# Patient Record
Sex: Female | Born: 1935
Health system: Southern US, Community
[De-identification: ages and names within clinical notes are randomized; demographics above are authoritative.]

## PROBLEM LIST (undated history)

## (undated) DIAGNOSIS — K219 Gastro-esophageal reflux disease without esophagitis: Secondary | ICD-10-CM

## (undated) DIAGNOSIS — I219 Acute myocardial infarction, unspecified: Secondary | ICD-10-CM

## (undated) DIAGNOSIS — I428 Other cardiomyopathies: Secondary | ICD-10-CM

## (undated) DIAGNOSIS — R609 Edema, unspecified: Secondary | ICD-10-CM

## (undated) DIAGNOSIS — I495 Sick sinus syndrome: Secondary | ICD-10-CM

## (undated) DIAGNOSIS — R0989 Other specified symptoms and signs involving the circulatory and respiratory systems: Secondary | ICD-10-CM

## (undated) DIAGNOSIS — D649 Anemia, unspecified: Secondary | ICD-10-CM

## (undated) DIAGNOSIS — E119 Type 2 diabetes mellitus without complications: Secondary | ICD-10-CM

## (undated) DIAGNOSIS — M8580 Other specified disorders of bone density and structure, unspecified site: Secondary | ICD-10-CM

## (undated) DIAGNOSIS — I10 Essential (primary) hypertension: Secondary | ICD-10-CM

## (undated) DIAGNOSIS — I4819 Other persistent atrial fibrillation: Secondary | ICD-10-CM

## (undated) DIAGNOSIS — E785 Hyperlipidemia, unspecified: Secondary | ICD-10-CM

## (undated) DIAGNOSIS — I2510 Atherosclerotic heart disease of native coronary artery without angina pectoris: Secondary | ICD-10-CM

## (undated) HISTORY — DX: Other cardiomyopathies: I42.8

## (undated) HISTORY — DX: Type 2 diabetes mellitus without complications: E11.9

## (undated) HISTORY — DX: Atherosclerotic heart disease of native coronary artery without angina pectoris: I25.10

## (undated) HISTORY — DX: Acute myocardial infarction, unspecified: I21.9

## (undated) HISTORY — DX: Gastro-esophageal reflux disease without esophagitis: K21.9

## (undated) HISTORY — DX: Essential (primary) hypertension: I10

## (undated) HISTORY — DX: Hyperlipidemia, unspecified: E78.5

## (undated) HISTORY — DX: Edema, unspecified: R60.9

## (undated) HISTORY — DX: Other specified disorders of bone density and structure, unspecified site: M85.80

## (undated) HISTORY — DX: Sick sinus syndrome: I49.5

## (undated) HISTORY — DX: Other specified symptoms and signs involving the circulatory and respiratory systems: R09.89

## (undated) HISTORY — DX: Other persistent atrial fibrillation: I48.19

## (undated) HISTORY — DX: Anemia, unspecified: D64.9

## (undated) NOTE — *Deleted (*Deleted)
Chronic Care Management Pharmacy  Name: [name]  MRN: [id number] DOB: 15-Jun-1929  Chief Complaint/ HPI  [name],  63 y.o. , female presents for their Follow-Up CCM visit with the clinical pharmacist via telephone.  PCP : [name], MD Patient Care Team: [name], MD as PCP - General (Family Medicine) Hillis Range, MD as PCP - Electrophysiology (Cardiology) Gaspar Cola, [facility] as Pharmacist (Pharmacist)  Their chronic conditions include: Hypertension, Hyperlipidemia, Diabetes, Atrial Fibrillation, Heart Failure, Coronary Artery Disease, Osteopenia and Gout   Patient is in good spirits today and feels her health overall has been good. She does not drive and relies on transportation from her grandson (29) or her grandaughter to take her to appointments, grocery shopping. She mainly stays alone at her home and spends her time reading, doing crossword puzzles or keeping up with household chores. She does have a cleaner that comes about once weekly.   Exercise: Mainly sedentary.  Diet: Does not follow any strict diet, states she "eats what she wants." She does not cook very often, but will sometimes make green beans or potatoes. She states she likes to make herself tomato sandwiches regularly. She will eat out (often [employer]) about once a week with her grandson.   Office Visits: 01/08/20: Patient presented to Dr. Caryl Never for follow-up.  06/24/19: Patient presented to Dr. Caryl Never for bilateral leg edema. Patient referred to cardiology.   Consult Visit: 11/04/19: Patient presented to Maxine Glenn, PA-C (Cardiology) for follow-up. Fluid status stable, no medication changes made.  09/08/19:  Patient presented to Maxine Glenn, PA-C (Cardiology) for follow-up. Sertraline stopped. Fluid status much improved with increase of furosemide to 60 mg daily. Potassium stopped. 08/27/19:  Patient presented to Maxine Glenn, PA-C (Cardiology) for follow-up. Furosemide  resumed at 40 mg and potassium 10 mEq daily.  06/30/19: Patient presented to Francis Dowse, PA-C (Cardiology) for follow-up. Colchine, hydrocodone-APAP stopped.    Allergies  Allergen Reactions  . Simvastatin Rash    Medications: Outpatient Encounter Medications as of 02/23/2020  Medication Sig  . acetaminophen (TYLENOL) 325 MG tablet Take 325 mg by mouth 2 (two) times daily as needed for moderate pain or headache.  Marland Kitchen apixaban (ELIQUIS) 5 MG TABS tablet Take 1 tablet (5 mg total) by mouth 2 (two) times daily.  Marland Kitchen aspirin 81 MG EC tablet Take 81 mg by mouth every evening.   Marland Kitchen atorvastatin (LIPITOR) 10 MG tablet Take 0.5 tablets (5 mg total) by mouth daily after supper.  . Blood Glucose Monitoring Suppl (ONE TOUCH ULTRA SYSTEM KIT) w/Device KIT 1 kit by Does not apply route once.  . Calcium Carb-Cholecalciferol (CALCIUM 600+D3 PO) Take 1 tablet by mouth daily.  . furosemide (LASIX) 40 MG tablet Take 1.5 tablets (60 mg total) by mouth daily. (Patient taking differently: Take 40 mg by mouth daily. )  . glucose blood test strip 1 each by Other route as needed for other. Use as instructed with One Touch glucometer.  . Magnesium Oxide 400 MG CAPS Take 1 capsule (400 mg total) by mouth daily. (Patient taking differently: Take 200 mg by mouth daily. )  . metoprolol tartrate (LOPRESSOR) 100 MG tablet Take 0.5 tablets (50 mg total) by mouth 2 (two) times daily.  . Multiple Vitamin (MULTIVITAMIN WITH MINERALS) TABS tablet Take 1 tablet by mouth daily. One-A-Day  . ramipril (ALTACE) 10 MG capsule Take 2 capsules (20 mg total) by mouth daily.  . vitamin B-12 (CYANOCOBALAMIN) 500 MCG tablet Take 500  mcg by mouth daily.  . vitamin E 400 UNIT capsule Take 400 Units by mouth daily.   No facility-administered encounter medications on file as of 02/23/2020.     Current Diagnosis/Assessment:    Goals Addressed   None     AFIB   Patient is currently rate controlled.  Patient has failed these meds in  past: Dofetilide (2012-2020 d/t recurrence and cost), Amiodarone (2011-2012 d/t PFTs) Patient is currently controlled on the following medications:  . Apixaban 5 mg BID  . Metoprolol Tartrate 100 mg 1/2 tab BID   We discussed:  Denies unusual bruising/bleeding  Plan  Continue current medications  Heart Failure   Type: Systolic  Last ejection fraction: 20-25% (10/15/14) NYHA Class: III (marked limitation of activity) AHA HF Stage: C (Heart disease and symptoms present)  Patient has failed these meds in past: n/a Patient is currently controlled on the following medications:  . Furosemide 40 mg 1.5 tablets daily (taking one tablet daily)  . Metoprolol tartrate 100 mg 1/2 tablet twice daily  . Ramipril 10 mg 1 capsule twice daily   We discussed: weighing daily; if you gain more than 3 pounds in one day or 5 pounds in one week call your doctor. Patient has only been taking furosemide 40 mg daily. She forgot that it was increased at a previous cardiology appointment. States her fluid status has been stable. She does not weigh herself regularly.   Patient not likely candidate for spironolactone due to labile potassium. Potential candidate for Entresto.   Plan  Continue current medications  Diabetes   A1c goal <7%  Recent Relevant Labs: Lab Results  Component Value Date/Time   HGBA1C 6.3 (A) 01/08/2020 12:16 PM   HGBA1C 6.6 (H) 07/14/2019 08:54 AM   HGBA1C 5.8 02/22/2014 10:54 AM   GFR 66.77 06/24/2019 03:55 PM   GFR 73.31 11/15/2017 01:32 PM    Last diabetic Eye exam: No results found for: HMDIABEYEEXA  Last diabetic Foot exam: No results found for: HMDIABFOOTEX   Checking BG: Rarely. Feels like her glucometer is "off" and is not sure if it is working properly.   Recent FBG Readings: n/a Recent pre-meal BG readings: n/a Recent 2hr PP BG readings:  n/a Recent HS BG readings: n/a  Patient has failed these meds in past: n/a Patient is currently controlled on the  following medications: . None  We discussed: diet and exercise extensively  Plan  Continue control with diet and exercise  Will plan for home visit to assess if patient's glucometer is working and accurate.   Hypertension   BP goal is:  <130/80  Office blood pressures are  BP Readings from Last 3 Encounters:  01/08/20 118/62  11/04/19 126/80  09/08/19 128/80   CMP Latest Ref Rng & Units 09/08/2019 08/27/2019 06/24/2019  Glucose 65 - 99 mg/dL 161(W) 960(A) 540(J)  BUN 8 - 27 mg/dL 81(X) 14 22  Creatinine 0.57 - 1.00 mg/dL 9.14 7.82 9.56  Sodium 134 - 144 mmol/L 145(H) 141 139  Potassium 3.5 - 5.2 mmol/L 5.0 4.7 4.4  Chloride 96 - 106 mmol/L 102 101 98  CO2 20 - 29 mmol/L 28 26 31   Calcium 8.7 - 10.3 mg/dL 21.3 9.4 9.7  Total Protein 6.0 - 8.5 g/dL - - -  Total Bilirubin 0.0 - 1.2 mg/dL - - -  Alkaline Phos 39 - 117 IU/L - - -  AST 0 - 40 IU/L - - -  ALT 0 - 32 IU/L - - -  Patient checks BP at home infrequently Patient home BP readings are ranging: n/a  Patient has failed these meds in the past: n/a Patient is currently controlled on the following medications:  . Furosemide 40 mg 1.5 tablets daily (take 40 mg daily) . Metoprolol tartrate 100 mg 1/2 tablet twice daily  . Ramipril 10 mg 2 caps daily   We discussed diet and exercise extensively  Plan  Continue current medications   Hyperlipidemia / CAD    LDL goal < 70 MI in 1998 s/p PCI to LAD   Lipid Panel     Component Value Date/Time   CHOL 144 10/09/2018 0000   TRIG 159 (H) 10/09/2018 0000   HDL 40 10/09/2018 0000   LDLCALC 72 10/09/2018 0000    Hepatic Function Latest Ref Rng & Units 10/09/2018 03/20/2017 02/22/2014  Total Protein 6.0 - 8.5 g/dL 7.0 6.9 7.0  Albumin 3.6 - 4.6 g/dL 4.3 3.9 8.2(N)  AST 0 - 40 IU/L 17 16 18   ALT 0 - 32 IU/L 11 9 15   Alk Phosphatase 39 - 117 IU/L 80 95 74  Total Bilirubin 0.0 - 1.2 mg/dL 0.9 0.7 0.8  Bilirubin, Direct 0.00 - 0.40 mg/dL - 5.62 0.1     The ASCVD Risk  score Denman George DC Jr., et al., 2013) failed to calculate for the following reasons:   The 2013 ASCVD risk score is only valid for ages 66 to 67   The patient has a prior MI or stroke diagnosis   Patient has failed these meds in past: Simvastatin  Patient is currently uncontrolled on the following medications:  . Aspirin 81 mg daily  . Atorvastatin 10 mg 1/2 tablet daily   We discussed: Diet and exercise extensively   Plan  Recommend stopping aspirin given distal history of MI and stable ischemia to minimize risk of bleeding.   Misc / OTC    . APAP 325 mg 2 tabs daily PRN . Calcium 600 mg + D3 daily  . Magnesium Oxide 400 mg daily (taking one 200 mg)   . Multivitamin Daily (One-A-Day) . Vitamin B12 500 mcg daily  . Vitamin E 400 units daily   Magnesium  Date Value Ref Range Status  06/24/2019 1.9 1.5 - 2.5 mg/dL Final   Vaccines   Reviewed and discussed patient's vaccination history.    Immunization History  Administered Date(s) Administered  . Fluad Quad(high Dose 65+) 01/05/2019  . Influenza Split 02/20/2012  . Influenza, High Dose Seasonal PF 03/03/2015, 02/21/2018  . Influenza,inj,Quad PF,6+ Mos 03/13/2013, 02/08/2014, 02/13/2016, 02/06/2017  . Pneumococcal Conjugate-13 04/23/1998, 02/22/2014    Plan  Recommended patient receive Covid vaccine.   Medication Management   Pt [facility] for [facility] medications Uses pill box? Yes, fills once weekly herself.   Plan  Continue current medication management strategy  Follow up: 1 week home visit to assess glucometer  3 month phone visit  Garey Ham Clinical Pharmacist [facility] [phone]

---

## 1999-01-10 ENCOUNTER — Other Ambulatory Visit: Admission: RE | Admit: 1999-01-10 | Discharge: 1999-01-10 | Payer: Self-pay | Admitting: Endocrinology

## 2004-04-23 HISTORY — PX: HIP FRACTURE SURGERY: SHX118

## 2004-04-26 ENCOUNTER — Ambulatory Visit: Payer: Self-pay | Admitting: Cardiovascular Disease

## 2004-05-03 ENCOUNTER — Ambulatory Visit: Payer: Self-pay

## 2004-05-10 ENCOUNTER — Ambulatory Visit: Payer: Self-pay | Admitting: Cardiology

## 2004-05-12 ENCOUNTER — Ambulatory Visit: Payer: Self-pay

## 2004-05-12 ENCOUNTER — Ambulatory Visit: Payer: Self-pay | Admitting: Cardiovascular Disease

## 2004-05-18 ENCOUNTER — Ambulatory Visit: Payer: Self-pay | Admitting: Internal Medicine

## 2004-05-24 ENCOUNTER — Ambulatory Visit: Payer: Self-pay | Admitting: Cardiology

## 2004-05-29 ENCOUNTER — Ambulatory Visit (HOSPITAL_COMMUNITY): Admission: RE | Admit: 2004-05-29 | Discharge: 2004-05-29 | Payer: Self-pay | Admitting: Cardiovascular Disease

## 2004-05-29 ENCOUNTER — Ambulatory Visit: Payer: Self-pay | Admitting: Cardiovascular Disease

## 2004-06-02 ENCOUNTER — Ambulatory Visit: Payer: Self-pay | Admitting: Cardiology

## 2004-06-12 ENCOUNTER — Ambulatory Visit: Payer: Self-pay | Admitting: Cardiovascular Disease

## 2004-06-29 ENCOUNTER — Ambulatory Visit: Payer: Self-pay | Admitting: Internal Medicine

## 2004-07-19 ENCOUNTER — Ambulatory Visit: Payer: Self-pay | Admitting: Internal Medicine

## 2004-07-19 ENCOUNTER — Ambulatory Visit: Payer: Self-pay

## 2004-07-20 ENCOUNTER — Ambulatory Visit: Payer: Self-pay | Admitting: Internal Medicine

## 2004-08-09 ENCOUNTER — Ambulatory Visit: Payer: Self-pay | Admitting: Cardiovascular Disease

## 2004-08-09 ENCOUNTER — Ambulatory Visit: Payer: Self-pay

## 2004-08-22 ENCOUNTER — Ambulatory Visit (HOSPITAL_COMMUNITY): Admission: RE | Admit: 2004-08-22 | Discharge: 2004-08-22 | Payer: Self-pay | Admitting: Cardiovascular Disease

## 2004-08-22 ENCOUNTER — Ambulatory Visit: Payer: Self-pay | Admitting: Internal Medicine

## 2004-08-28 ENCOUNTER — Ambulatory Visit: Payer: Self-pay | Admitting: Cardiovascular Disease

## 2004-08-30 ENCOUNTER — Ambulatory Visit: Payer: Self-pay | Admitting: Cardiology

## 2004-09-04 ENCOUNTER — Ambulatory Visit (HOSPITAL_COMMUNITY): Admission: RE | Admit: 2004-09-04 | Discharge: 2004-09-04 | Payer: Self-pay | Admitting: Cardiovascular Disease

## 2004-09-08 ENCOUNTER — Ambulatory Visit: Payer: Self-pay | Admitting: Cardiovascular Disease

## 2004-09-13 ENCOUNTER — Ambulatory Visit: Payer: Self-pay | Admitting: Cardiovascular Disease

## 2004-09-13 ENCOUNTER — Ambulatory Visit: Payer: Self-pay | Admitting: Cardiology

## 2004-09-19 ENCOUNTER — Ambulatory Visit: Payer: Self-pay | Admitting: *Deleted

## 2004-09-26 ENCOUNTER — Ambulatory Visit: Payer: Self-pay | Admitting: Internal Medicine

## 2004-09-28 ENCOUNTER — Ambulatory Visit: Payer: Self-pay | Admitting: Cardiology

## 2004-09-28 ENCOUNTER — Ambulatory Visit: Payer: Self-pay | Admitting: Cardiovascular Disease

## 2004-10-12 ENCOUNTER — Ambulatory Visit: Payer: Self-pay | Admitting: Cardiology

## 2004-10-20 ENCOUNTER — Ambulatory Visit: Payer: Self-pay | Admitting: Cardiology

## 2004-11-07 ENCOUNTER — Ambulatory Visit: Payer: Self-pay | Admitting: Cardiology

## 2004-12-05 ENCOUNTER — Ambulatory Visit: Payer: Self-pay | Admitting: Cardiology

## 2005-01-05 ENCOUNTER — Ambulatory Visit: Payer: Self-pay | Admitting: Cardiovascular Disease

## 2005-01-05 ENCOUNTER — Ambulatory Visit: Payer: Self-pay | Admitting: Cardiology

## 2005-01-12 ENCOUNTER — Inpatient Hospital Stay (HOSPITAL_COMMUNITY): Admission: EM | Admit: 2005-01-12 | Discharge: 2005-01-18 | Payer: Self-pay | Admitting: Emergency Medicine

## 2005-01-15 ENCOUNTER — Ambulatory Visit: Payer: Self-pay | Admitting: Cardiovascular Disease

## 2005-01-18 ENCOUNTER — Inpatient Hospital Stay
Admission: RE | Admit: 2005-01-18 | Discharge: 2005-01-30 | Payer: Self-pay | Admitting: Physical Medicine & Rehabilitation

## 2005-01-18 ENCOUNTER — Ambulatory Visit: Payer: Self-pay | Admitting: Physical Medicine & Rehabilitation

## 2005-02-23 ENCOUNTER — Ambulatory Visit: Payer: Self-pay | Admitting: Cardiology

## 2005-03-09 ENCOUNTER — Ambulatory Visit: Payer: Self-pay | Admitting: Cardiology

## 2005-03-19 ENCOUNTER — Emergency Department (HOSPITAL_COMMUNITY): Admission: EM | Admit: 2005-03-19 | Discharge: 2005-03-20 | Payer: Self-pay | Admitting: Emergency Medicine

## 2005-03-26 ENCOUNTER — Ambulatory Visit: Payer: Self-pay | Admitting: Cardiovascular Disease

## 2005-04-04 ENCOUNTER — Ambulatory Visit: Payer: Self-pay | Admitting: Cardiology

## 2005-04-12 ENCOUNTER — Ambulatory Visit: Payer: Self-pay | Admitting: Cardiology

## 2005-04-27 ENCOUNTER — Ambulatory Visit: Payer: Self-pay | Admitting: Cardiology

## 2005-05-17 ENCOUNTER — Ambulatory Visit: Payer: Self-pay

## 2005-06-11 ENCOUNTER — Ambulatory Visit: Payer: Self-pay | Admitting: Cardiology

## 2005-06-25 ENCOUNTER — Ambulatory Visit: Payer: Self-pay | Admitting: Cardiology

## 2005-07-11 ENCOUNTER — Ambulatory Visit: Payer: Self-pay | Admitting: Cardiology

## 2005-07-23 ENCOUNTER — Ambulatory Visit: Payer: Self-pay

## 2005-08-08 ENCOUNTER — Ambulatory Visit: Payer: Self-pay | Admitting: *Deleted

## 2005-09-05 ENCOUNTER — Ambulatory Visit: Payer: Self-pay | Admitting: Cardiology

## 2005-10-11 ENCOUNTER — Ambulatory Visit: Payer: Self-pay | Admitting: Cardiology

## 2005-10-11 ENCOUNTER — Ambulatory Visit: Payer: Self-pay | Admitting: Cardiovascular Disease

## 2005-11-08 ENCOUNTER — Ambulatory Visit: Payer: Self-pay | Admitting: Cardiology

## 2005-11-08 ENCOUNTER — Ambulatory Visit: Payer: Self-pay

## 2005-12-06 ENCOUNTER — Ambulatory Visit: Payer: Self-pay | Admitting: Cardiology

## 2005-12-20 ENCOUNTER — Ambulatory Visit: Payer: Self-pay | Admitting: Cardiology

## 2006-01-17 ENCOUNTER — Ambulatory Visit: Payer: Self-pay | Admitting: Cardiology

## 2006-02-14 ENCOUNTER — Ambulatory Visit: Payer: Self-pay | Admitting: Cardiology

## 2006-03-07 ENCOUNTER — Ambulatory Visit: Payer: Self-pay | Admitting: Cardiology

## 2006-03-28 ENCOUNTER — Ambulatory Visit: Payer: Self-pay | Admitting: Cardiovascular Disease

## 2006-04-04 ENCOUNTER — Ambulatory Visit: Payer: Self-pay | Admitting: Cardiology

## 2006-04-23 HISTORY — PX: KNEE SURGERY: SHX244

## 2006-05-16 ENCOUNTER — Ambulatory Visit: Payer: Self-pay | Admitting: Cardiology

## 2006-05-30 ENCOUNTER — Ambulatory Visit: Payer: Self-pay | Admitting: Cardiology

## 2006-06-14 ENCOUNTER — Ambulatory Visit: Payer: Self-pay | Admitting: Internal Medicine

## 2006-06-28 ENCOUNTER — Ambulatory Visit: Payer: Self-pay | Admitting: Cardiology

## 2006-07-04 ENCOUNTER — Encounter: Payer: Self-pay | Admitting: Cardiology

## 2006-07-04 ENCOUNTER — Ambulatory Visit: Payer: Self-pay

## 2006-07-04 ENCOUNTER — Ambulatory Visit: Payer: Self-pay | Admitting: Cardiovascular Disease

## 2006-07-08 ENCOUNTER — Ambulatory Visit (HOSPITAL_COMMUNITY): Admission: RE | Admit: 2006-07-08 | Discharge: 2006-07-08 | Payer: Self-pay | Admitting: Cardiovascular Disease

## 2006-07-08 ENCOUNTER — Ambulatory Visit: Payer: Self-pay | Admitting: Cardiovascular Disease

## 2006-07-26 ENCOUNTER — Ambulatory Visit: Payer: Self-pay | Admitting: Cardiology

## 2006-08-16 ENCOUNTER — Ambulatory Visit: Payer: Self-pay | Admitting: Cardiology

## 2006-09-25 ENCOUNTER — Ambulatory Visit: Payer: Self-pay | Admitting: Cardiology

## 2006-10-23 ENCOUNTER — Ambulatory Visit: Payer: Self-pay | Admitting: Cardiology

## 2006-11-14 ENCOUNTER — Ambulatory Visit: Payer: Self-pay | Admitting: Cardiology

## 2006-11-28 ENCOUNTER — Ambulatory Visit: Payer: Self-pay | Admitting: Cardiovascular Disease

## 2006-12-29 ENCOUNTER — Ambulatory Visit: Payer: Self-pay | Admitting: Cardiovascular Disease

## 2006-12-29 ENCOUNTER — Inpatient Hospital Stay (HOSPITAL_COMMUNITY): Admission: EM | Admit: 2006-12-29 | Discharge: 2007-01-09 | Payer: Self-pay | Admitting: Emergency Medicine

## 2007-01-06 ENCOUNTER — Ambulatory Visit: Payer: Self-pay | Admitting: Physical Medicine & Rehabilitation

## 2007-04-25 ENCOUNTER — Ambulatory Visit: Payer: Self-pay | Admitting: Cardiology

## 2007-05-09 ENCOUNTER — Ambulatory Visit: Payer: Self-pay | Admitting: Cardiology

## 2007-05-29 ENCOUNTER — Ambulatory Visit: Payer: Self-pay | Admitting: Cardiology

## 2007-06-26 ENCOUNTER — Ambulatory Visit: Payer: Self-pay | Admitting: Cardiovascular Disease

## 2007-07-24 ENCOUNTER — Ambulatory Visit: Payer: Self-pay | Admitting: Internal Medicine

## 2007-08-22 ENCOUNTER — Ambulatory Visit: Payer: Self-pay | Admitting: Cardiovascular Disease

## 2007-08-22 ENCOUNTER — Ambulatory Visit: Payer: Self-pay | Admitting: Internal Medicine

## 2007-09-19 ENCOUNTER — Ambulatory Visit: Payer: Self-pay | Admitting: Cardiology

## 2007-10-17 ENCOUNTER — Ambulatory Visit: Payer: Self-pay | Admitting: Cardiology

## 2007-10-17 ENCOUNTER — Ambulatory Visit: Payer: Self-pay

## 2007-11-18 ENCOUNTER — Ambulatory Visit: Payer: Self-pay | Admitting: Internal Medicine

## 2007-11-25 ENCOUNTER — Ambulatory Visit: Payer: Self-pay | Admitting: Cardiology

## 2007-12-09 ENCOUNTER — Ambulatory Visit: Payer: Self-pay | Admitting: Cardiology

## 2007-12-23 ENCOUNTER — Ambulatory Visit: Payer: Self-pay | Admitting: Cardiology

## 2008-01-09 ENCOUNTER — Ambulatory Visit: Payer: Self-pay | Admitting: Cardiology

## 2008-01-26 ENCOUNTER — Ambulatory Visit: Payer: Self-pay | Admitting: Cardiology

## 2008-02-16 ENCOUNTER — Ambulatory Visit: Payer: Self-pay | Admitting: Cardiology

## 2008-03-05 ENCOUNTER — Ambulatory Visit: Payer: Self-pay | Admitting: Cardiovascular Disease

## 2008-03-05 ENCOUNTER — Ambulatory Visit: Payer: Self-pay | Admitting: Internal Medicine

## 2008-03-23 ENCOUNTER — Ambulatory Visit: Payer: Self-pay | Admitting: Cardiology

## 2008-04-27 ENCOUNTER — Ambulatory Visit: Payer: Self-pay | Admitting: Cardiovascular Disease

## 2008-04-27 ENCOUNTER — Ambulatory Visit: Payer: Self-pay | Admitting: Cardiology

## 2008-04-27 LAB — CONVERTED CEMR LAB
BUN: 12 mg/dL (ref 6–23)
Calcium: 8.9 mg/dL (ref 8.4–10.5)
GFR calc Af Amer: 125 mL/min
GFR calc non Af Amer: 103 mL/min
Potassium: 4 meq/L (ref 3.5–5.1)
Sodium: 141 meq/L (ref 135–145)

## 2008-06-29 ENCOUNTER — Ambulatory Visit: Payer: Self-pay | Admitting: Cardiology

## 2008-06-29 ENCOUNTER — Ambulatory Visit: Payer: Self-pay | Admitting: Cardiovascular Disease

## 2008-07-20 ENCOUNTER — Ambulatory Visit: Payer: Self-pay | Admitting: Cardiology

## 2008-08-10 ENCOUNTER — Ambulatory Visit: Payer: Self-pay | Admitting: Cardiology

## 2008-08-31 IMAGING — CR DG HIP (WITH OR WITHOUT PELVIS) 2-3V*L*
1 series · 1 of 1 positions shown · non-contrast
Comparison: 01/20/05.

CLINICAL DATA: Injury to left lower leg. Evaluate right knee. Left hip pain.
 LEFT HIP ? 3 VIEW:

[view not recorded]
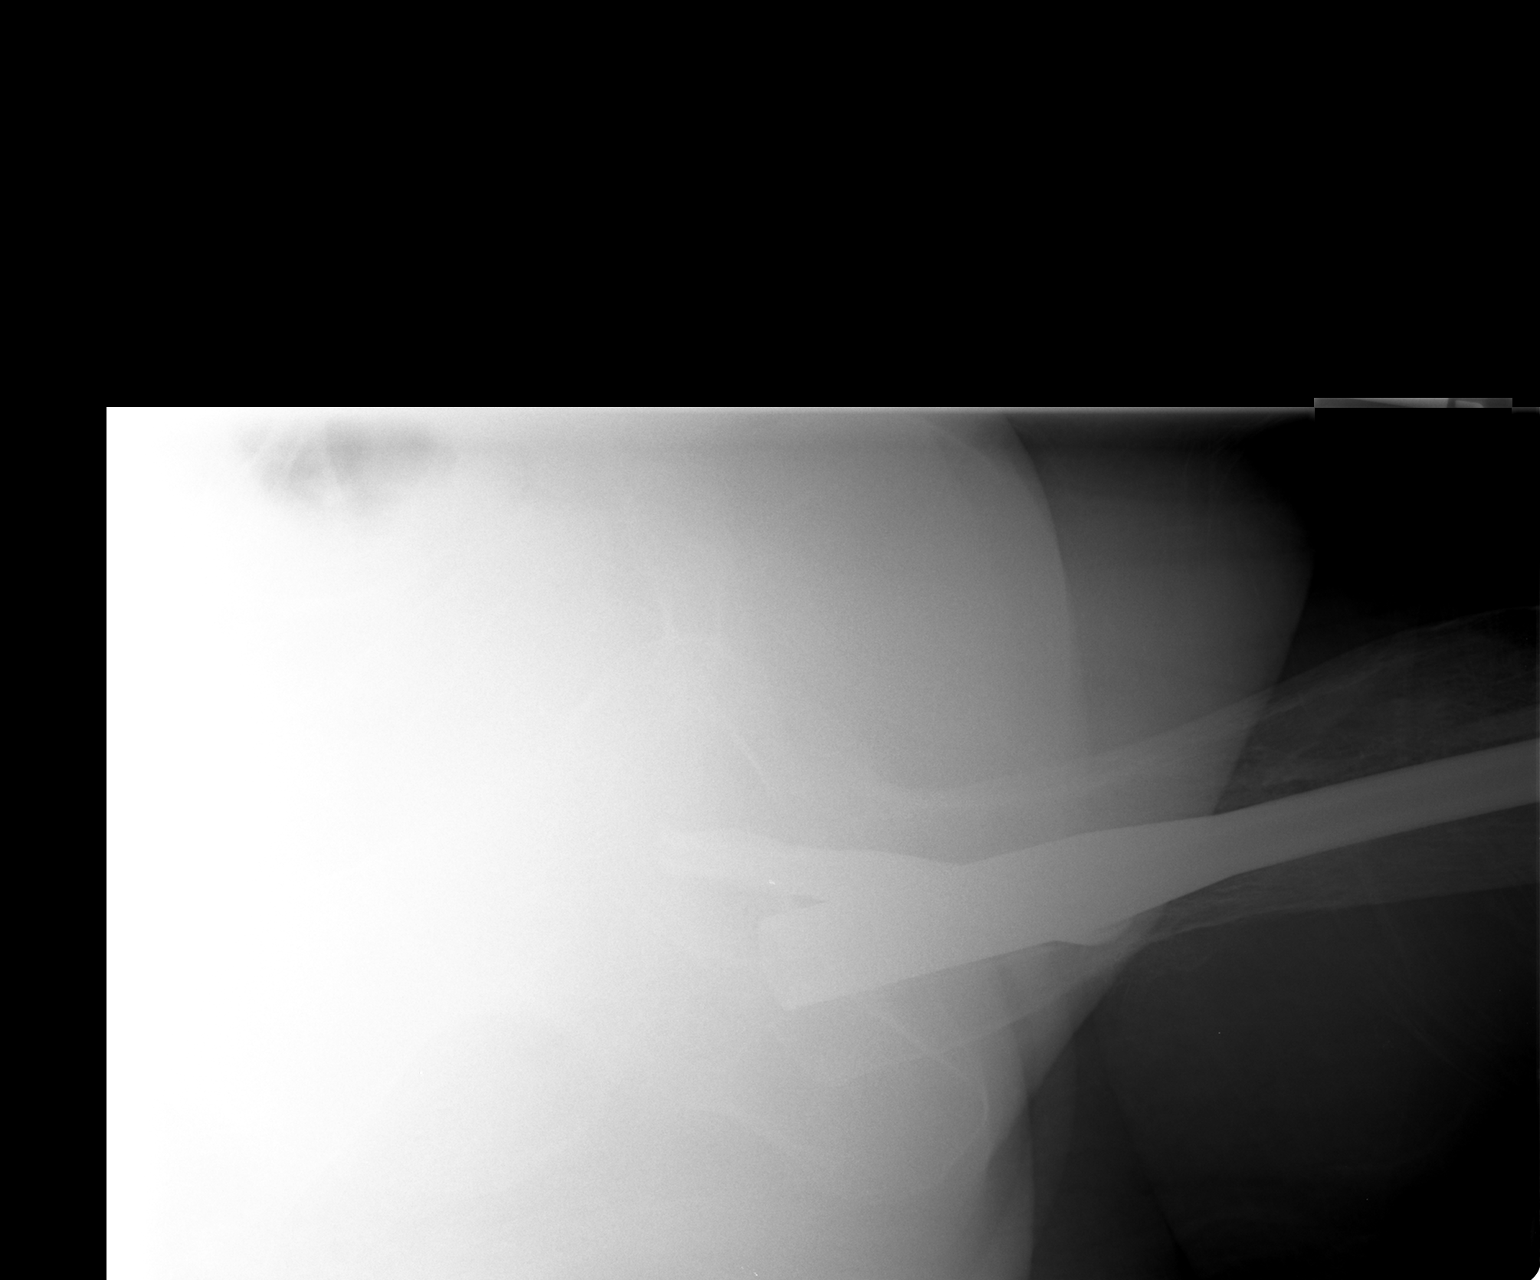

[1 of 1 positions shown; findings below may reference images not displayed]

FINDINGS: Intramedullary rod/nail within the proximal left femur is identified with traversing a healed fracture of the proximal femur.  There is no evidence of acute or complicating features.  Diffuse osteopenia is identified.
IMPRESSION: No acute abnormalities.
 RIGHT KNEE ? 2 VIEW:
FINDINGS: Mild tricompartmental degenerative changes and mild osteopenia identified. No acute fracture, subluxation, joint effusion noted.
IMPRESSION: 1.  No acute abnormality.
 2.  Mild tricompartmental degenerative changes and osteopenia.

## 2008-08-31 IMAGING — CR DG KNEE 1-2V*R*
1 series · 1 of 1 positions shown · non-contrast
Comparison: 01/20/05.

CLINICAL DATA: Injury to left lower leg. Evaluate right knee. Left hip pain.
 LEFT HIP ? 3 VIEW:

[t knee ap right]
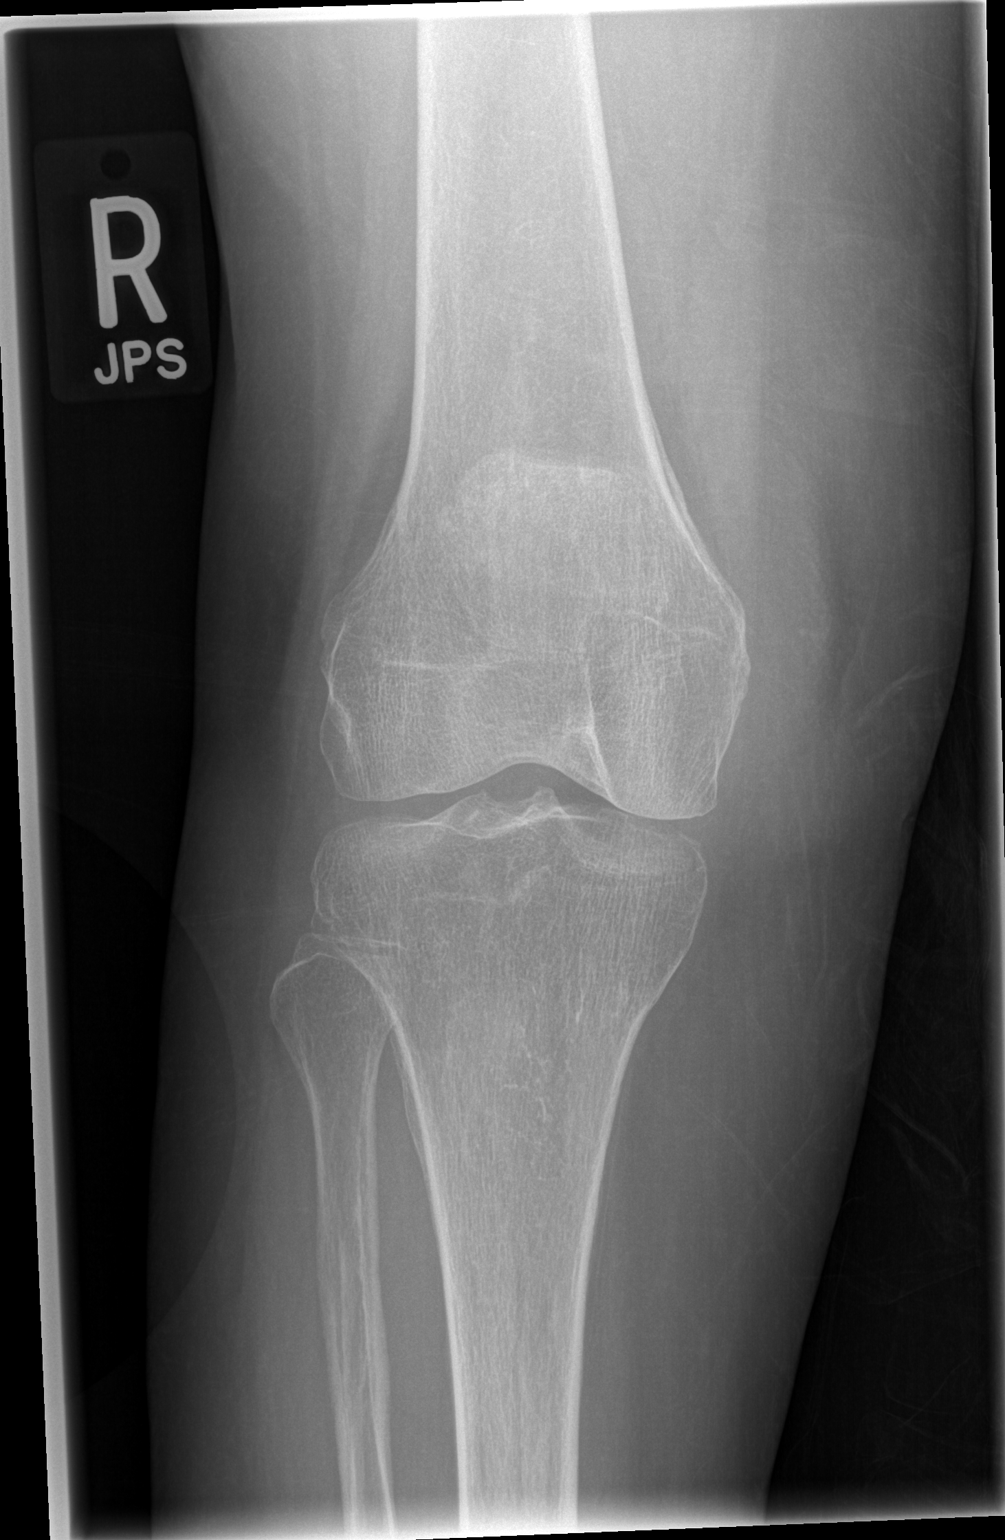

[1 of 1 positions shown; findings below may reference images not displayed]

FINDINGS: Intramedullary rod/nail within the proximal left femur is identified with traversing a healed fracture of the proximal femur.  There is no evidence of acute or complicating features.  Diffuse osteopenia is identified.
IMPRESSION: No acute abnormalities.
 RIGHT KNEE ? 2 VIEW:
FINDINGS: Mild tricompartmental degenerative changes and mild osteopenia identified. No acute fracture, subluxation, joint effusion noted.
IMPRESSION: 1.  No acute abnormality.
 2.  Mild tricompartmental degenerative changes and osteopenia.

## 2008-09-07 ENCOUNTER — Ambulatory Visit: Payer: Self-pay | Admitting: Cardiovascular Disease

## 2008-09-21 ENCOUNTER — Encounter: Payer: Self-pay | Admitting: *Deleted

## 2008-09-22 ENCOUNTER — Telehealth: Payer: Self-pay | Admitting: Cardiovascular Disease

## 2008-09-24 DIAGNOSIS — R0989 Other specified symptoms and signs involving the circulatory and respiratory systems: Secondary | ICD-10-CM

## 2008-09-24 DIAGNOSIS — I219 Acute myocardial infarction, unspecified: Secondary | ICD-10-CM

## 2008-09-24 DIAGNOSIS — I428 Other cardiomyopathies: Secondary | ICD-10-CM

## 2008-09-24 DIAGNOSIS — E785 Hyperlipidemia, unspecified: Secondary | ICD-10-CM | POA: Insufficient documentation

## 2008-09-24 DIAGNOSIS — I4891 Unspecified atrial fibrillation: Secondary | ICD-10-CM

## 2008-09-24 DIAGNOSIS — R609 Edema, unspecified: Secondary | ICD-10-CM

## 2008-09-24 DIAGNOSIS — I2510 Atherosclerotic heart disease of native coronary artery without angina pectoris: Secondary | ICD-10-CM

## 2008-09-24 DIAGNOSIS — I4819 Other persistent atrial fibrillation: Secondary | ICD-10-CM

## 2008-09-24 DIAGNOSIS — E119 Type 2 diabetes mellitus without complications: Secondary | ICD-10-CM

## 2008-09-24 DIAGNOSIS — I119 Hypertensive heart disease without heart failure: Secondary | ICD-10-CM

## 2008-09-24 DIAGNOSIS — I10 Essential (primary) hypertension: Secondary | ICD-10-CM

## 2008-09-24 DIAGNOSIS — E7800 Pure hypercholesterolemia, unspecified: Secondary | ICD-10-CM

## 2008-09-24 HISTORY — DX: Essential (primary) hypertension: I10

## 2008-09-24 HISTORY — DX: Edema, unspecified: R60.9

## 2008-09-24 HISTORY — DX: Other persistent atrial fibrillation: I48.19

## 2008-09-24 HISTORY — DX: Other cardiomyopathies: I42.8

## 2008-09-24 HISTORY — DX: Other specified symptoms and signs involving the circulatory and respiratory systems: R09.89

## 2008-09-24 HISTORY — DX: Acute myocardial infarction, unspecified: I21.9

## 2008-09-24 HISTORY — DX: Type 2 diabetes mellitus without complications: E11.9

## 2008-09-24 HISTORY — DX: Atherosclerotic heart disease of native coronary artery without angina pectoris: I25.10

## 2008-09-24 HISTORY — DX: Hyperlipidemia, unspecified: E78.5

## 2008-09-27 ENCOUNTER — Ambulatory Visit: Payer: Self-pay | Admitting: Cardiovascular Disease

## 2008-09-27 LAB — CONVERTED CEMR LAB: Protime: 21.6

## 2008-10-04 ENCOUNTER — Ambulatory Visit: Payer: Self-pay | Admitting: Cardiology

## 2008-10-04 LAB — CONVERTED CEMR LAB: POC INR: 2.4

## 2008-10-08 ENCOUNTER — Telehealth: Payer: Self-pay | Admitting: Cardiovascular Disease

## 2008-10-11 ENCOUNTER — Ambulatory Visit: Payer: Self-pay | Admitting: Cardiovascular Disease

## 2008-10-11 ENCOUNTER — Encounter (INDEPENDENT_AMBULATORY_CARE_PROVIDER_SITE_OTHER): Payer: Self-pay | Admitting: *Deleted

## 2008-10-11 ENCOUNTER — Ambulatory Visit: Payer: Self-pay | Admitting: Internal Medicine

## 2008-10-18 ENCOUNTER — Ambulatory Visit: Payer: Self-pay | Admitting: Cardiovascular Disease

## 2008-10-18 ENCOUNTER — Ambulatory Visit (HOSPITAL_COMMUNITY): Admission: RE | Admit: 2008-10-18 | Discharge: 2008-10-18 | Payer: Self-pay | Admitting: Cardiovascular Disease

## 2008-10-27 ENCOUNTER — Encounter: Payer: Self-pay | Admitting: *Deleted

## 2008-11-02 ENCOUNTER — Encounter (INDEPENDENT_AMBULATORY_CARE_PROVIDER_SITE_OTHER): Payer: Self-pay | Admitting: Cardiology

## 2008-11-02 ENCOUNTER — Ambulatory Visit: Payer: Self-pay | Admitting: Cardiovascular Disease

## 2008-11-15 ENCOUNTER — Ambulatory Visit: Payer: Self-pay | Admitting: Cardiology

## 2008-11-15 LAB — CONVERTED CEMR LAB: POC INR: 2.8

## 2008-11-26 ENCOUNTER — Ambulatory Visit: Payer: Self-pay | Admitting: Cardiology

## 2008-11-26 LAB — CONVERTED CEMR LAB
POC INR: 4.7
Prothrombin Time: 26.2 s

## 2008-12-06 ENCOUNTER — Ambulatory Visit: Payer: Self-pay | Admitting: Cardiology

## 2008-12-06 LAB — CONVERTED CEMR LAB: POC INR: 3.1

## 2008-12-20 ENCOUNTER — Ambulatory Visit: Payer: Self-pay | Admitting: Cardiovascular Disease

## 2009-01-07 ENCOUNTER — Ambulatory Visit: Payer: Self-pay | Admitting: Cardiovascular Disease

## 2009-01-18 ENCOUNTER — Ambulatory Visit: Payer: Self-pay | Admitting: Cardiology

## 2009-01-18 LAB — CONVERTED CEMR LAB: POC INR: 2.6

## 2009-02-10 ENCOUNTER — Ambulatory Visit: Payer: Self-pay | Admitting: Cardiology

## 2009-03-08 ENCOUNTER — Ambulatory Visit: Payer: Self-pay | Admitting: Cardiology

## 2009-04-05 ENCOUNTER — Ambulatory Visit: Payer: Self-pay | Admitting: Cardiovascular Disease

## 2009-04-05 LAB — CONVERTED CEMR LAB: POC INR: 3.1

## 2009-04-27 ENCOUNTER — Encounter: Admission: RE | Admit: 2009-04-27 | Discharge: 2009-04-27 | Payer: Self-pay | Admitting: Endocrinology

## 2009-04-29 ENCOUNTER — Telehealth: Payer: Self-pay | Admitting: Cardiovascular Disease

## 2009-05-02 ENCOUNTER — Observation Stay (HOSPITAL_COMMUNITY): Admission: EM | Admit: 2009-05-02 | Discharge: 2009-05-04 | Payer: Self-pay | Admitting: Emergency Medicine

## 2009-05-06 ENCOUNTER — Encounter: Payer: Self-pay | Admitting: Internal Medicine

## 2009-05-06 LAB — CONVERTED CEMR LAB
POC INR: 2.22
Prothrombin Time: 19.4 s

## 2009-05-09 ENCOUNTER — Encounter: Payer: Self-pay | Admitting: Cardiology

## 2009-05-09 LAB — CONVERTED CEMR LAB
POC INR: 3.6
Prothrombin Time: 43.5 s

## 2009-05-12 ENCOUNTER — Encounter: Payer: Self-pay | Admitting: Cardiology

## 2009-05-12 LAB — CONVERTED CEMR LAB: POC INR: 3

## 2009-05-13 ENCOUNTER — Encounter: Payer: Self-pay | Admitting: Cardiovascular Disease

## 2009-05-18 ENCOUNTER — Encounter: Payer: Self-pay | Admitting: Cardiology

## 2009-05-24 ENCOUNTER — Ambulatory Visit: Payer: Self-pay | Admitting: Diagnostic Radiology

## 2009-05-24 ENCOUNTER — Emergency Department (HOSPITAL_BASED_OUTPATIENT_CLINIC_OR_DEPARTMENT_OTHER): Admission: EM | Admit: 2009-05-24 | Discharge: 2009-05-24 | Payer: Self-pay | Admitting: Emergency Medicine

## 2009-05-27 ENCOUNTER — Encounter: Payer: Self-pay | Admitting: Internal Medicine

## 2009-06-08 ENCOUNTER — Telehealth: Payer: Self-pay | Admitting: Cardiovascular Disease

## 2009-06-08 ENCOUNTER — Encounter: Payer: Self-pay | Admitting: Internal Medicine

## 2009-06-08 LAB — CONVERTED CEMR LAB
POC INR: 4.5
Prothrombin Time: 54.4 s

## 2009-06-15 ENCOUNTER — Encounter: Payer: Self-pay | Admitting: Internal Medicine

## 2009-06-15 LAB — CONVERTED CEMR LAB
POC INR: 2.5
Prothrombin Time: 30.4 s

## 2009-06-21 ENCOUNTER — Encounter: Payer: Self-pay | Admitting: Cardiovascular Disease

## 2009-06-21 LAB — CONVERTED CEMR LAB
POC INR: 3
Prothrombin Time: 36.1 s

## 2009-06-28 ENCOUNTER — Encounter: Payer: Self-pay | Admitting: Cardiovascular Disease

## 2009-07-07 ENCOUNTER — Encounter: Payer: Self-pay | Admitting: Cardiovascular Disease

## 2009-07-22 ENCOUNTER — Encounter: Payer: Self-pay | Admitting: Cardiovascular Disease

## 2009-07-25 ENCOUNTER — Ambulatory Visit: Payer: Self-pay | Admitting: Internal Medicine

## 2009-08-03 ENCOUNTER — Encounter (INDEPENDENT_AMBULATORY_CARE_PROVIDER_SITE_OTHER): Payer: Self-pay | Admitting: *Deleted

## 2009-08-29 ENCOUNTER — Ambulatory Visit: Payer: Self-pay | Admitting: Internal Medicine

## 2009-10-03 ENCOUNTER — Ambulatory Visit: Payer: Self-pay | Admitting: Cardiovascular Disease

## 2009-10-25 ENCOUNTER — Ambulatory Visit: Payer: Self-pay | Admitting: Internal Medicine

## 2009-10-25 ENCOUNTER — Ambulatory Visit: Payer: Self-pay

## 2009-10-25 ENCOUNTER — Encounter: Payer: Self-pay | Admitting: Cardiovascular Disease

## 2009-10-25 LAB — CONVERTED CEMR LAB: POC INR: 1.8

## 2009-10-31 ENCOUNTER — Ambulatory Visit: Payer: Self-pay | Admitting: Internal Medicine

## 2009-11-01 ENCOUNTER — Encounter: Payer: Self-pay | Admitting: Cardiovascular Disease

## 2009-11-02 LAB — CONVERTED CEMR LAB
Albumin: 3.8 g/dL (ref 3.5–5.2)
Alkaline Phosphatase: 74 units/L (ref 39–117)
Basophils Absolute: 0 10*3/uL (ref 0.0–0.1)
CO2: 30 meq/L (ref 19–32)
Calcium: 8.8 mg/dL (ref 8.4–10.5)
Creatinine, Ser: 0.7 mg/dL (ref 0.4–1.2)
Eosinophils Absolute: 0.1 10*3/uL (ref 0.0–0.7)
Glucose, Bld: 80 mg/dL (ref 70–99)
Lymphocytes Relative: 28.6 % (ref 12.0–46.0)
MCHC: 34 g/dL (ref 30.0–36.0)
Monocytes Absolute: 0.7 10*3/uL (ref 0.1–1.0)
Neutro Abs: 3.1 10*3/uL (ref 1.4–7.7)
Neutrophils Relative %: 56.9 % (ref 43.0–77.0)
RDW: 14.3 % (ref 11.5–14.6)

## 2009-11-22 ENCOUNTER — Ambulatory Visit: Payer: Self-pay | Admitting: Cardiology

## 2009-11-22 LAB — CONVERTED CEMR LAB: POC INR: 2.3

## 2009-12-20 ENCOUNTER — Ambulatory Visit: Payer: Self-pay | Admitting: Cardiology

## 2009-12-20 LAB — CONVERTED CEMR LAB: POC INR: 2.2

## 2010-01-18 ENCOUNTER — Ambulatory Visit: Payer: Self-pay | Admitting: Cardiology

## 2010-02-15 ENCOUNTER — Ambulatory Visit: Payer: Self-pay | Admitting: Cardiovascular Disease

## 2010-03-01 ENCOUNTER — Telehealth: Payer: Self-pay | Admitting: Cardiovascular Disease

## 2010-03-15 ENCOUNTER — Ambulatory Visit: Payer: Self-pay | Admitting: Internal Medicine

## 2010-03-15 LAB — CONVERTED CEMR LAB: POC INR: 1.6

## 2010-03-27 ENCOUNTER — Ambulatory Visit: Payer: Self-pay | Admitting: Cardiovascular Disease

## 2010-03-27 ENCOUNTER — Encounter: Payer: Self-pay | Admitting: Cardiovascular Disease

## 2010-04-05 ENCOUNTER — Ambulatory Visit: Payer: Self-pay | Admitting: Cardiology

## 2010-05-02 ENCOUNTER — Telehealth (INDEPENDENT_AMBULATORY_CARE_PROVIDER_SITE_OTHER): Payer: Self-pay | Admitting: *Deleted

## 2010-05-03 ENCOUNTER — Ambulatory Visit: Admission: RE | Admit: 2010-05-03 | Discharge: 2010-05-03 | Payer: Self-pay | Source: Home / Self Care

## 2010-05-03 ENCOUNTER — Encounter: Payer: Self-pay | Admitting: Cardiology

## 2010-05-03 ENCOUNTER — Encounter (HOSPITAL_COMMUNITY)
Admission: RE | Admit: 2010-05-03 | Discharge: 2010-05-23 | Payer: Self-pay | Source: Home / Self Care | Attending: Cardiovascular Disease | Admitting: Cardiovascular Disease

## 2010-05-03 ENCOUNTER — Encounter: Payer: Self-pay | Admitting: *Deleted

## 2010-05-03 LAB — CONVERTED CEMR LAB: POC INR: 1.8

## 2010-05-21 LAB — CONVERTED CEMR LAB
HDL goal, serum: 40 mg/dL
LDL Goal: 70 mg/dL

## 2010-05-23 NOTE — Progress Notes (Signed)
Summary: PT RESULTS  Phone Note From Other Clinic   Caller: [name] cell [phone] Request: Talk with Nurse Details for Reason: PT 54.4 INR 4.5 . TODAY. PT HAD OUT BREAK OF SHINGLE, LAST WEEK. PUT ON FAMBIR 500 MG 3 X TIME A DAY, LAST DOSAGE THIS FRIDAY. MEDROL 4 MG MEDS SAME TIME. SHE HAS BEING TAKEN 1.25 MG BLOOD THINNER DAILY EXPECTED FOR MONDAY WHEN SHE TAKE 2.5 MG.  HOME HEALTH IS AWARE THAT I WILL SEND AN URGENT MESSAGE TO [facility].  Initial call taken by: Lorne Skeens,  June 08, 2009 10:55 AM  Follow-up for Phone Call        INR noted in EMR, spoke with Dennie Bible, [facility] RN while at pt's home and dosed pt.  See coumadin note in EMR. Follow-up by: Cloyde Reams, RN, BSN,  June 08, 2009 12:01 PM

## 2010-05-23 NOTE — Medication Information (Signed)
Summary: Coumadin Clinic  Anticoagulant Therapy  Managed by: Cloyde Reams, RN, BSN Referring MD: Charlton Haws MD Supervising MD: Johney Frame MD, Fayrene Fearing Indication 1: Atrial Fibrillation (ICD-427.31) Indication 2: DCCV Pending (ICD-000.00) Lab Used: [facility] Site: [facility] PT 32.6 INR POC 2.7 INR RANGE 2 - 3    Bleeding/hemorrhagic complications: no     Any changes in medication regimen? no     Any missed doses?: yes     Details: held does on 2/1 and 2/2 per [facility] med ctr instructions.     Comments: Pt went to ED 05/24/09 went to ED for assorted pains  INR 3.4-held x 2 days 2/1 and 2/2  Allergies: 1)  ! Zocor  Anticoagulation Management History:      The patient is taking warfarin and comes in today for a routine follow up visit.  Positive risk factors for bleeding include an age of 75 years or older and presence of serious comorbidities.  The bleeding index is 'intermediate risk'.  Positive CHADS2 values include History of HTN, Age > 53 years old, and History of Diabetes.  The start date was 04/11/2004.  Prothrombin time is 32.6.  Anticoagulation responsible provider: Murice Barbar MD, Fayrene Fearing.  INR POC: 2.7.  Exp: 05/2010.    Anticoagulation Management Assessment/Plan:      The patient's current anticoagulation dose is Coumadin 2.5 mg tabs: Take as directed by coumadin clinic..  The target INR is 2 - 3.  The next INR is due 06/08/2009.  Anticoagulation instructions were given to [facility], RN.  Results were reviewed/authorized by Cloyde Reams, RN, BSN.  She was notified by Shelby Dubin PharmD, BCPS, CPP.         Prior Anticoagulation Instructions: INR 3.4  Spoke with Dennie Bible, [facility] RN.  Advised to have pt hold today's dose of coumadin then decr dosage to 1/2 tablet daily except 1 tablet on Mondays.  Recheck in 10 days on 05/27/09.  Current Anticoagulation Instructions: INR 2.7  Spoke with Dennie Bible, RN while at pt's home.  Instructed to have pt start taking 2.5mg  daily.   Recheck on 06/08/09.

## 2010-05-23 NOTE — Medication Information (Signed)
Summary: Coumadin Clinic  Anticoagulant Therapy  Managed by: Cloyde Reams, RN, BSN Referring MD: Charlton Haws MD Supervising MD: Gala Romney MD, Reuel Boom Indication 1: Atrial Fibrillation (ICD-427.31) Indication 2: DCCV Pending (ICD-000.00) Lab Used: [facility] Site: [facility] PT 19.4 INR POC 2.22 INR RANGE 2 - 3    Bleeding/hemorrhagic complications: no     Any changes in medication regimen? no     Any missed doses?: no        Comments: Discharged from hospital 05/04/09, admitted for back pain.    Allergies: 1)  ! Zocor  Anticoagulation Management History:      Her anticoagulation is being managed by telephone today.  Positive risk factors for bleeding include an age of 41 years or older and presence of serious comorbidities.  The bleeding index is 'intermediate risk'.  Positive CHADS2 values include History of HTN, Age > 79 years old, and History of Diabetes.  The start date was 04/11/2004.  Prothrombin time is 19.4.  Anticoagulation responsible provider: Taim Wurm MD, Reuel Boom.  INR POC: 2.22.  Exp: 05/2010.    Anticoagulation Management Assessment/Plan:      The patient's current anticoagulation dose is Coumadin 2.5 mg tabs: Take as directed by coumadin clinic..  The target INR is 2 - 3.  The next INR is due 05/09/2009.  Anticoagulation instructions were given to home health nurse.  Results were reviewed/authorized by Cloyde Reams, RN, BSN.  She was notified by Bethena Midget, RN, BSN.         Prior Anticoagulation Instructions: INR 3.1 Today skip dose then resume 1/2 pill everyday except 1 pill on Mondays, Wednesdays and Fridays. Recheck in 4 weeks.   Current Anticoagulation Instructions: INR 2.22  Spoke with [facility] RN, continue on same dosage 1.25mg  daily except 2.5mg  on MWF.  Per hospital orders [facility] will redraw Monday 05/09/09.

## 2010-05-23 NOTE — Medication Information (Signed)
Summary: Coumadin Clinic  Anticoagulant Therapy  Managed by: Shelby Dubin, PharmD, BCPS, CPP Referring MD: Charlton Haws MD Supervising MD: Myrtis Ser MD, Tinnie Gens Indication 1: Atrial Fibrillation (ICD-427.31) Indication 2: DCCV Pending (ICD-000.00) Lab Used: [facility] Site: [facility] PT 37.7 INR POC 3.1 INR RANGE 2 - 3  Dietary changes: yes       Details: has incorporated some greens, will try to incorporate more.    Health status changes: no    Bleeding/hemorrhagic complications: no    Recent/future hospitalizations: no    Any changes in medication regimen? no    Recent/future dental: no  Any missed doses?: no       Is patient compliant with meds? yes       Allergies: 1)  ! Zocor  Anticoagulation Management History:      Her anticoagulation is being managed by telephone today.  Positive risk factors for bleeding include an age of 88 years or older and presence of serious comorbidities.  The bleeding index is 'intermediate risk'.  Positive CHADS2 values include History of HTN, Age > 37 years old, and History of Diabetes.  The start date was 04/11/2004.  Prothrombin time is 37.7.  Anticoagulation responsible provider: Myrtis Ser MD, Tinnie Gens.  INR POC: 3.1.    Anticoagulation Management Assessment/Plan:      The patient's current anticoagulation dose is Coumadin 2.5 mg tabs: Take as directed by coumadin clinic..  The target INR is 2 - 3.  The next INR is due 07/08/2009.  Anticoagulation instructions were given to [facility], RN.  Results were reviewed/authorized by Shelby Dubin, PharmD, BCPS, CPP.  She was notified by Bethena Midget, RN, BSN.         Prior Anticoagulation Instructions: INR 3.0  Spoke with Dennie Bible while at pt's home.  Advised to have pt continue on same dosage and encourage her to incorporate some vit K foods into her diet regularly.  Recheck in 1 week.    Current Anticoagulation Instructions: INR 3.1  Incorporate greens, and continue 0.5 tab daily.  Skip Tuesday's dose..      Orders to Dennie Bible, RN [facility]...they d/cd patient today.

## 2010-05-23 NOTE — Miscellaneous (Signed)
Summary: Orders Update pft charges  Clinical Lists Changes  Orders: Added new Service order of Carbon Monoxide diffusing w/capacity (94720) - Signed Added new Service order of Lung Volumes (94240) - Signed Added new Service order of Spirometry (Pre & Post) (94060) - Signed 

## 2010-05-23 NOTE — Progress Notes (Signed)
Summary: coumadin check  Phone Note Call from Patient Call back at [phone]   Caller: Daughter/Mindy Reason for Call: Talk to Nurse Summary of Call: request call back... pt has hurt her back... unable to get out of bed, was seen by PCP, can not get her to coumadin appt, can she have home health check coumadin levels drawn Initial call taken by: Migdalia Dk,  April 29, 2009 2:57 PM  Follow-up for Phone Call        Called spoke with pt's daughter  advised she needs to call pt's primary MD and request a home health evaluation for pt.  Pt saw Dr Dagoberto Ligas and he put her on bedrest.  Advised daughter insurance will not cover [facility] for PT/INR check only, but if pt qualifies for other services, which it sounds like based on daughter's report of back injury, she will.  Then we will be more than happy to request PT/INR to be drawn as well at that time.  Daughter is going to contact primary MD and request, she will call us back to let us know the response. Follow-up by: Cloyde Reams RN,  April 29, 2009 3:23 PM

## 2010-05-23 NOTE — Medication Information (Signed)
Summary: rov/tm  Anticoagulant Therapy  Managed by: Weston Brass, PharmD Referring MD: Charlton Haws MD PCP: Dr. Dagoberto Ligas Supervising MD: Eden Emms MD, Theron Arista Indication 1: Atrial Fibrillation (ICD-427.31) Indication 2: DCCV Pending (ICD-000.00) Lab Used: [facility] Site: [facility] INR POC 2.1 INR RANGE 2 - 3  Dietary changes: no    Health status changes: no    Bleeding/hemorrhagic complications: no    Recent/future hospitalizations: no    Any changes in medication regimen? no    Recent/future dental: no  Any missed doses?: yes     Details: A few times, last time 2 weeks ago.    Is patient compliant with meds? yes       Allergies: 1)  ! Zocor  Anticoagulation Management History:      The patient is taking warfarin and comes in today for a routine follow up visit.  Positive risk factors for bleeding include an age of 75 years or older and presence of serious comorbidities.  The bleeding index is 'intermediate risk'.  Positive CHADS2 values include History of HTN, Age > 6 years old, and History of Diabetes.  The start date was 04/11/2004.  Anticoagulation responsible provider: Eden Emms MD, Theron Arista.  INR POC: 2.1.  Cuvette Lot#: [id number].  Exp: 02/2011.    Anticoagulation Management Assessment/Plan:      The patient's current anticoagulation dose is Coumadin 2.5 mg tabs: Take as directed by coumadin clinic..  The target INR is 2 - 3.  The next INR is due 03/15/2010.  Anticoagulation instructions were given to patient.  Results were reviewed/authorized by Weston Brass, PharmD.  She was notified by Haynes Hoehn, PharmD Candidate.         Prior Anticoagulation Instructions: INR 1.9 Today take 1 pill then resume 1/2 pill everyday. Recheck in 4 weeks.   Current Anticoagulation Instructions: INR 2.1  Continue Coumadin as scheduled:  0.5 tablet every day of the week.  Return to clinic in 4 weeks.

## 2010-05-23 NOTE — Medication Information (Signed)
Summary: [name]  Anticoagulant Therapy  Managed by: Bethena Midget, RN, BSN Referring MD: Charlton Haws MD PCP: Dr. Maryjane Hurter MD: Myrtis Ser MD, Tinnie Gens Indication 1: Atrial Fibrillation (ICD-427.31) Indication 2: DCCV Pending (ICD-000.00) Lab Used: [facility] Site: [facility] INR POC 1.9 INR RANGE 2 - 3  Dietary changes: yes       Details: Ate extra green veggies  Health status changes: no    Bleeding/hemorrhagic complications: no    Recent/future hospitalizations: no    Any changes in medication regimen? no    Recent/future dental: yes     Details: Pending Crown placement on 01/23/10  Any missed doses?: no       Is patient compliant with meds? yes       Allergies: 1)  ! Zocor  Anticoagulation Management History:      The patient is taking warfarin and comes in today for a routine follow up visit.  Positive risk factors for bleeding include an age of 75 years or older and presence of serious comorbidities.  The bleeding index is 'intermediate risk'.  Positive CHADS2 values include History of HTN, Age > 8 years old, and History of Diabetes.  The start date was 04/11/2004.  Anticoagulation responsible provider: Myrtis Ser MD, Tinnie Gens.  INR POC: 1.9.  Cuvette Lot#: [id number].  Exp: 02/2011.    Anticoagulation Management Assessment/Plan:      The patient's current anticoagulation dose is Coumadin 2.5 mg tabs: Take as directed by coumadin clinic..  The target INR is 2 - 3.  The next INR is due 02/15/2010.  Anticoagulation instructions were given to patient.  Results were reviewed/authorized by Bethena Midget, RN, BSN.  She was notified by Bethena Midget, RN, BSN.         Prior Anticoagulation Instructions: INR 2.2  Continue taking 1/2 tablet (1.25mg ) every day.  Recheck in 4 weeks.   Current Anticoagulation Instructions: INR 1.9 Today take 1 pill then resume 1/2 pill everyday. Recheck in 4 weeks.

## 2010-05-23 NOTE — Medication Information (Signed)
Summary: rov/tm  Anticoagulant Therapy  Managed by: Cloyde Reams, RN, BSN Referring MD: Charlton Haws MD PCP: Dr. Dagoberto Ligas Supervising MD: Jens Som MD, Arlys John Indication 1: Atrial Fibrillation (ICD-427.31) Indication 2: DCCV Pending (ICD-000.00) Lab Used: [facility] Site: [facility] INR POC 2.3 INR RANGE 2 - 3  Dietary changes: no    Health status changes: no    Bleeding/hemorrhagic complications: no    Recent/future hospitalizations: no    Any changes in medication regimen? no    Recent/future dental: no  Any missed doses?: no       Is patient compliant with meds? yes       Allergies: 1)  ! Zocor  Anticoagulation Management History:      The patient is taking warfarin and comes in today for a routine follow up visit.  Positive risk factors for bleeding include an age of 75 years or older and presence of serious comorbidities.  The bleeding index is 'intermediate risk'.  Positive CHADS2 values include History of HTN, Age > 75 years old, and History of Diabetes.  The start date was 04/11/2004.  Anticoagulation responsible provider: Jens Som MD, Arlys John.  INR POC: 2.3.  Cuvette Lot#: [id number].  Exp: 01/2011.    Anticoagulation Management Assessment/Plan:      The patient's current anticoagulation dose is Coumadin 2.5 mg tabs: Take as directed by coumadin clinic..  The target INR is 2 - 3.  The next INR is due 12/20/2009.  Anticoagulation instructions were given to patient.  Results were reviewed/authorized by Cloyde Reams, RN, BSN.  She was notified by Cloyde Reams RN.         Prior Anticoagulation Instructions: INR 1.8 Today take 1pill then resume 1/2 pill everyday. Recheck in 4 weeks.   Current Anticoagulation Instructions: INR 2.3  Continue on same dosage 1/2 tablet daily.  Rechekc in 4 weeks.

## 2010-05-23 NOTE — Medication Information (Signed)
Summary: ccr/jml  Anticoagulant Therapy  Managed by: Cloyde Reams, RN, BSN Referring MD: Charlton Haws MD Supervising MD: Tenny Craw MD, Gunnar Fusi Indication 1: Atrial Fibrillation (ICD-427.31) Indication 2: DCCV Pending (ICD-000.00) Lab Used: [facility] Site: [facility] INR POC 2.6 INR RANGE 2 - 3  Dietary changes: no    Health status changes: no    Bleeding/hemorrhagic complications: no    Recent/future hospitalizations: no    Any changes in medication regimen? yes       Details: Started on Neurontin x 2 weeks, taking Hydrocodone prn.    Recent/future dental: no  Any missed doses?: no       Is patient compliant with meds? yes       Allergies (verified): 1)  ! Zocor  Anticoagulation Management History:      The patient is taking warfarin and comes in today for a routine follow up visit.  Positive risk factors for bleeding include an age of 41 years or older and presence of serious comorbidities.  The bleeding index is 'intermediate risk'.  Positive CHADS2 values include History of HTN, Age > 22 years old, and History of Diabetes.  The start date was 04/11/2004.  Anticoagulation responsible provider: Tenny Craw MD, Gunnar Fusi.  INR POC: 2.6.  Cuvette Lot#: [id number].  Exp: 08/2010.    Anticoagulation Management Assessment/Plan:      The patient's current anticoagulation dose is Coumadin 2.5 mg tabs: Take as directed by coumadin clinic..  The target INR is 2 - 3.  The next INR is due 08/22/2009.  Anticoagulation instructions were given to [facility], RN.  Results were reviewed/authorized by Cloyde Reams, RN, BSN.  She was notified by Cloyde Reams RN.         Prior Anticoagulation Instructions: INR 3.1  Incorporate greens, and continue 0.5 tab daily.  Skip Tuesday's dose..     Orders to Dennie Bible, RN [facility]...they d/cd patient today.    Current Anticoagulation Instructions: INR 2.6  Continue on same dosage 1/2 tablet daily.  Recheck in 4 weeks.

## 2010-05-23 NOTE — Medication Information (Signed)
Summary: rov/cs  Anticoagulant Therapy  Managed by: Weston Brass, PharmD Referring MD: Charlton Haws MD PCP: Dr. Dagoberto Ligas Supervising MD: Gala Romney MD, Reuel Boom Indication 1: Atrial Fibrillation (ICD-427.31) Indication 2: DCCV Pending (ICD-000.00) Lab Used: [facility] Site: [facility] INR POC 1.6 INR RANGE 2 - 3  Dietary changes: yes       Details: Eating extra broccoli  Health status changes: no    Bleeding/hemorrhagic complications: no    Recent/future hospitalizations: no    Any changes in medication regimen? no    Recent/future dental: no  Any missed doses?: yes     Details: May have missed a dose  Is patient compliant with meds? yes       Allergies: 1)  ! Zocor  Anticoagulation Management History:      The patient is taking warfarin and comes in today for a routine follow up visit.  Positive risk factors for bleeding include an age of 75 years or older and presence of serious comorbidities.  The bleeding index is 'intermediate risk'.  Positive CHADS2 values include History of HTN, Age > 75 years old, and History of Diabetes.  The start date was 04/11/2004.  Anticoagulation responsible provider: Lashara Urey MD, Reuel Boom.  INR POC: 1.6.  Cuvette Lot#: [id number].  Exp: 03/2011.    Anticoagulation Management Assessment/Plan:      The patient's current anticoagulation dose is Coumadin 2.5 mg tabs: Take as directed by coumadin clinic..  The target INR is 2 - 3.  The next INR is due 04/05/2010.  Anticoagulation instructions were given to patient.  Results were reviewed/authorized by Weston Brass, PharmD.  She was notified by Hoy Register, PharmD Candidate.         Prior Anticoagulation Instructions: INR 2.1  Continue Coumadin as scheduled:  0.5 tablet every day of the week.  Return to clinic in 4 weeks.   Current Anticoagulation Instructions: INR 1.6 Take 1 tablet today and tomorrow continue previous dose of 0.5 tablet everyday  Recheck INR in 3 weeks

## 2010-05-23 NOTE — Assessment & Plan Note (Signed)
Summary: PER CHECK OUT/SF   Primary Provider:  Dr. Dagoberto Ligas  CC:  chest pain.  History of Present Illness: [name] is seen today for PAF, [facility] x2 most recently 6/10 maint on lowdose amiodarone.  She has HTN, carotid bruit CAD with stent to LAD in 2006.  She has been compliant with her meds.  She has followed in the coumadin clinic with Rx INR and no bleeding problems.  She denies  palpitaiotns, syncope or dyspnea.  She  had a duplex showing no significant ICA stenosis for Right bruit  Her amiodarone has been stopped.  She has been having some intermitant SSCP.  Not alwyas exertional.  Can last seconds to minutes.  Has not taken nitrol  Discussed need for Lexiscan myovue given pain and known CAD  Current Problems (verified): 1)  Coumadin Therapy  (ICD-V58.61) 2)  Diabetes Mellitus  (ICD-250.00) 3)  Carotid Bruit  (ICD-785.9) 4)  Mi  (ICD-410.90) 5)  Hyperlipidemia  (ICD-272.4) 6)  Hypertension  (ICD-401.9) 7)  Paroxysmal Atrial Fibrillation  (ICD-427.31) 8)  Cardiomyopathy  (ICD-425.4) 9)  Cad  (ICD-414.00) 10)  Edema  (ICD-782.3) 11)  Hypercholesterolemia  (ICD-272.0)  Current Medications (verified): 1)  Coumadin 2.5 Mg Tabs (Warfarin Sodium) .... Take As Directed By Coumadin Clinic. 2)  Lipitor 10 Mg Tabs (Atorvastatin Calcium) .... 1/2 Tab By Mouth Once Daily 3)  Niaspan 500 Mg Cr-Tabs (Niacin (Antihyperlipidemic)) .Marland Kitchen.. 1 Tab By Mouth Two Times A Day 4)  Multivitamins   Tabs (Multiple Vitamin) .Marland Kitchen.. 1 Tab By Mouth Once Daily 5)  Aspirin 81 Mg  Tabs (Aspirin) .Marland Kitchen.. 1 Tab By Mouth Once Daily 6)  Altace 10 Mg Caps (Ramipril) .... Tab By Mouth Once Daily 7)  Vitamin E 400 Unit Caps (Vitamin E) .Marland Kitchen.. 1 Tab By Mouth Once Daily 8)  Calcium 500 Mg Tabs (Calcium Carbonate) .... Tab By Mouth Once Daily 9)  Vitamin D3 400 Unit Tabs (Cholecalciferol) .... Tab By Mouth Once Daily 10)  Furosemide 40 Mg Tabs (Furosemide) .... As Needed 11)  Metoprolol Tartrate 25 Mg Tabs (Metoprolol Tartrate) .Marland Kitchen.. 1 Tab  Twice Daily 12)  Vitamin B-12 250 Mcg Tabs (Cyanocobalamin) .Marland Kitchen.. 1 Tab By Mouth Once Daily 13)  Stool Softner .... 2 Tab By Mouth Once Daily 14)  Gabapentin 100 Mg Caps (Gabapentin) .Marland Kitchen.. 1 Tab By Mouth Three Times A Day 15)  Hydrocodone-Acetaminophen 10-325 Mg Tabs (Hydrocodone-Acetaminophen) .... As Needed  Allergies (verified): 1)  ! Zocor  Past History:  Past Medical History: Last updated: 09/24/2008 Current Problems:  DIABETES MELLITUS (ICD-250.00) CAROTID BRUIT (ICD-785.9) MI (ICD-410.90):  Anterior with PCI LAD 07/2004 HYPERLIPIDEMIA (ICD-272.4) HYPERTENSION (ICD-401.9) PAROXYSMAL ATRIAL FIBRILLATION (ICD-427.31) CARDIOMYOPATHY (ICD-425.4) CAD (ICD-414.00) EDEMA (ICD-782.3) HYPERCHOLESTEROLEMIA (ICD-272.0)     Orthopedic  issues.  Past Surgical History: Last updated: 09/24/2008 Recent fracture of the right forearm.   left hip fracture, open reduction   internal fixation about 2 years ago.    angioplasty, cardioversion, Rod in left femur   Family History: Last updated: 09/24/2008   Positive for coronary artery disease in her sibling.   Social History: Last updated: 09/24/2008  She has a remote history of tobacco use, but has not   smoked in 15 years.  She does not consume alcohol.      Review of Systems       Denies fever, malais, weight loss, blurry vision, decreased visual acuity, cough, sputum, SOB, hemoptysis, pleuritic pain, palpitaitons, heartburn, abdominal pain, melena, lower extremity edema, claudication, or rash.   Vital Signs:  Patient profile:   77 year old female Height:      65 inches Weight:      165 pounds Pulse rate:   54 / minute Resp:     14 per minute BP sitting:   134 / 62  (left arm)  Vitals Entered By: Kem Parkinson (March 27, 2010 8:15 AM)  Physical Exam  General:  Affect appropriate Healthy:  appears stated age HEENT: normal Neck supple with no adenopathy JVP normal right  bruits no thyromegaly Lungs clear with  no wheezing and good diaphragmatic motion Heart:  S1/S2 no murmur,rub, gallop or click PMI normal Abdomen: benighn, BS positve, no tenderness, no AAA no bruit.  No HSM or HJR Distal pulses intact with no bruits No edema Neuro non-focal Skin warm and dry    Impression & Recommendations:  Problem # 1:  COUMADIN THERAPY (ICD-V58.61) Someitmes low INR but I prefer this to too high.  F/U clinic  Problem # 2:  CAROTID BRUIT (ICD-785.9) Consider duplex in 2 years  no significant disease 2010  Problem # 3:  HYPERLIPIDEMIA (ICD-272.4) At goal with no side effects Her updated medication list for this problem includes:    Lipitor 10 Mg Tabs (Atorvastatin calcium) .Marland Kitchen... 1/2 tab by mouth once daily    Niaspan 500 Mg Cr-tabs (Niacin (antihyperlipidemic)) .Marland Kitchen... 1 tab by mouth two times a day  CHOL (goal): 200 (10/11/2008)   LDL (goal): 70 (10/11/2008)   HDL (goal): 40 (10/11/2008)   TG (goal): 150 (10/11/2008)  Her updated medication list for this problem includes:    Lipitor 10 Mg Tabs (Atorvastatin calcium) .Marland Kitchen... 1/2 tab by mouth once daily    Niaspan 500 Mg Cr-tabs (Niacin (antihyperlipidemic)) .Marland Kitchen... 1 tab by mouth two times a day  Problem # 4:  MI (ICD-410.90) CAD with SSCP  F/U Lexiscan.  Continue asa and BB Her updated medication list for this problem includes:    Coumadin 2.5 Mg Tabs (Warfarin sodium) .Marland Kitchen... Take as directed by coumadin clinic.    Aspirin 81 Mg Tabs (Aspirin) .Marland Kitchen... 1 tab by mouth once daily    Altace 10 Mg Caps (Ramipril) .Marland Kitchen... Tab by mouth once daily    Metoprolol Tartrate 25 Mg Tabs (Metoprolol tartrate) .Marland Kitchen... 1 tab twice daily  Problem # 5:  HYPERTENSION (ICD-401.9)  Well controlled Her updated medication list for this problem includes:    Aspirin 81 Mg Tabs (Aspirin) .Marland Kitchen... 1 tab by mouth once daily    Altace 10 Mg Caps (Ramipril) .Marland Kitchen... Tab by mouth once daily    Furosemide 40 Mg Tabs (Furosemide) .Marland Kitchen... As needed    Metoprolol Tartrate 25 Mg Tabs (Metoprolol  tartrate) .Marland Kitchen... 1 tab twice daily  Her updated medication list for this problem includes:    Aspirin 81 Mg Tabs (Aspirin) .Marland Kitchen... 1 tab by mouth once daily    Altace 10 Mg Caps (Ramipril) .Marland Kitchen... Tab by mouth once daily    Furosemide 40 Mg Tabs (Furosemide) .Marland Kitchen... As needed    Metoprolol Tartrate 25 Mg Tabs (Metoprolol tartrate) .Marland Kitchen... 1 tab twice daily  Problem # 6:  PAROXYSMAL ATRIAL FIBRILLATION (ICD-427.31) Maint NSR on ECG today Her updated medication list for this problem includes:    Coumadin 2.5 Mg Tabs (Warfarin sodium) .Marland Kitchen... Take as directed by coumadin clinic.    Aspirin 81 Mg Tabs (Aspirin) .Marland Kitchen... 1 tab by mouth once daily    Metoprolol Tartrate 25 Mg Tabs (Metoprolol tartrate) .Marland Kitchen... 1 tab twice daily  Other Orders: EKG w/  Interpretation (93000) Nuclear Stress Test (Nuc Stress Test)  Patient Instructions: 1)  Your physician recommends that you schedule a follow-up appointment in:  6 MONTH WITH DR Eden Emms 2)  Your physician recommends that you continue on your current medications as directed. Please refer to the Current Medication list given to you today. 3)  Your physician has requested that you have an lexiscan stress myoview.  For further information please visit [email or web address]  Please follow instruction sheet, as given. 4)  JANUARY 2012

## 2010-05-23 NOTE — Medication Information (Signed)
Summary: Coumadin Clinic  Anticoagulant Therapy  Managed by: Bethena Midget, RN, BSN Referring MD: Charlton Haws MD Supervising MD: Riley Kill MD, Maisie Fus Indication 1: Atrial Fibrillation (ICD-427.31) Indication 2: DCCV Pending (ICD-000.00) Lab Used[facility]  Site: [facility] PT 35.5 INR POC 3.0 INR RANGE 2 - 3  Dietary changes: no    Health status changes: no    Bleeding/hemorrhagic complications: no    Recent/future hospitalizations: no    Any changes in medication regimen? no    Recent/future dental: no  Any missed doses?: no       Is patient compliant with meds? yes      Comments: Pt. took 2.5mg s on Wednesday. (yesterday).   Allergies: 1)  ! Zocor  Anticoagulation Management History:      The patient is taking warfarin and comes in today for a routine follow up visit.  Positive risk factors for bleeding include an age of 41 years or older and presence of serious comorbidities.  The bleeding index is 'intermediate risk'.  Positive CHADS2 values include History of HTN, Age > 23 years old, and History of Diabetes.  The start date was 04/11/2004.  Prothrombin time is 35.5.  Anticoagulation responsible provider: Riley Kill MD, Maisie Fus.  INR POC: 3.0.    Anticoagulation Management Assessment/Plan:      The patient's current anticoagulation dose is Coumadin 2.5 mg tabs: Take as directed by coumadin clinic..  The target INR is 2 - 3.  The next INR is due 05/18/2009.  Anticoagulation instructions were given to [facility], RN.  Results were reviewed/authorized by Bethena Midget, RN, BSN.  She was notified by Bethena Midget, RN, BSN.         Prior Anticoagulation Instructions: INR 3.6  Spoke with Dennie Bible, [facility] RN while at pt's home.  Advised to hold tonight's dosage then decr dosage to 1.25mg  daily except 2.5mg  on Mondays and Fridays.  Recheck on 05/12/09 when scheduled back at pt's home for [facility] visit, next scheduled visit is for 05/18/09.    Current Anticoagulation Instructions: INR 3.0 Change  dose to 1.25mg s daily except 2.5mg s on M&F. Orders and dose given to Ardis Hughs [facility] nurse while at home with pt. Recheck in one week.

## 2010-05-23 NOTE — Medication Information (Signed)
Summary: rov/ewj  Anticoagulant Therapy  Managed by: Weston Brass, PharmD Referring MD: Charlton Haws MD Supervising MD: Tenny Craw MD, Gunnar Fusi Indication 1: Atrial Fibrillation (ICD-427.31) Indication 2: DCCV Pending (ICD-000.00) Lab Used: [facility] Site: [facility] INR POC 2.1 INR RANGE 2 - 3  Dietary changes: yes       Details: increased greens  Health status changes: no    Bleeding/hemorrhagic complications: no    Recent/future hospitalizations: no    Any changes in medication regimen? yes       Details: on hydrocodone and gabapentin for back pain  Recent/future dental: no  Any missed doses?: no       Is patient compliant with meds? yes       Allergies: 1)  ! Zocor  Anticoagulation Management History:      The patient is taking warfarin and comes in today for a routine follow up visit.  Positive risk factors for bleeding include an age of 75 years or older and presence of serious comorbidities.  The bleeding index is 'intermediate risk'.  Positive CHADS2 values include History of HTN, Age > 75 years old, and History of Diabetes.  The start date was 04/11/2004.  Anticoagulation responsible provider: Tenny Craw MD, Gunnar Fusi.  INR POC: 2.1.  Cuvette Lot#: [id number].  Exp: 09/2010.    Anticoagulation Management Assessment/Plan:      The patient's current anticoagulation dose is Coumadin 2.5 mg tabs: Take as directed by coumadin clinic..  The target INR is 2 - 3.  The next INR is due 09/26/2009.  Anticoagulation instructions were given to [facility], RN.  Results were reviewed/authorized by Weston Brass, PharmD.  She was notified by Weston Brass PharmD.         Prior Anticoagulation Instructions: INR 2.6  Continue on same dosage 1/2 tablet daily.  Recheck in 4 weeks.    Current Anticoagulation Instructions: INR 2.1  Continue same dose of 1/2 tablet every day

## 2010-05-23 NOTE — Miscellaneous (Signed)
Summary: [facility] Orders  [facility] Orders   Imported By: Roderic Ovens 08/01/2009 13:57:12  _____________________________________________________________________  External Attachment:    Type:   Image     Comment:   External Document

## 2010-05-23 NOTE — Miscellaneous (Signed)
Summary: Orders Update  Clinical Lists Changes  Orders: Added new Test order of Carotid Duplex (Carotid Duplex) - Signed 

## 2010-05-23 NOTE — Assessment & Plan Note (Signed)
Summary: 6 MO ./CY   Primary Provider:  Dr. Dagoberto Ligas  CC:  sob.  History of Present Illness: [name] is seen today for PAF, [facility] x2 most recently 6/10 maint on lowdose amiodarone.  She has HTN, carotid bruit CAD with stent to LAD in 2006.  She has been compliant with her meds.  She has followed in the coumadin clinic with Rx INR and no bleeding problems.  She denies SSCP, palpitaiotns, syncope or dyspnea.  She needs a F/U duplex for right bruit.  She needs screening for amiodarone side effects.  She will decrease her amiodarone to daily since she is relatively bradycardic when in sinus  Current Problems (verified): 1)  Coumadin Therapy  (ICD-V58.61) 2)  Diabetes Mellitus  (ICD-250.00) 3)  Carotid Bruit  (ICD-785.9) 4)  Mi  (ICD-410.90) 5)  Hyperlipidemia  (ICD-272.4) 6)  Hypertension  (ICD-401.9) 7)  Paroxysmal Atrial Fibrillation  (ICD-427.31) 8)  Cardiomyopathy  (ICD-425.4) 9)  Cad  (ICD-414.00) 10)  Edema  (ICD-782.3) 11)  Hypercholesterolemia  (ICD-272.0)  Current Medications (verified): 1)  Coumadin 2.5 Mg Tabs (Warfarin Sodium) .... Take As Directed By Coumadin Clinic. 2)  Lipitor 10 Mg Tabs (Atorvastatin Calcium) .... 1/2 Tab By Mouth Once Daily 3)  Niaspan 500 Mg Cr-Tabs (Niacin (Antihyperlipidemic)) .... Tab By Mouth Two Times A Day 4)  Multivitamins   Tabs (Multiple Vitamin) .Marland Kitchen.. 1 Tab By Mouth Once Daily 5)  Aspirin 81 Mg  Tabs (Aspirin) .Marland Kitchen.. 1 Tab By Mouth Once Daily 6)  Altace 10 Mg Caps (Ramipril) .... Tab By Mouth Once Daily 7)  Vitamin E 400 Unit Caps (Vitamin E) .Marland Kitchen.. 1 Tab By Mouth Once Daily 8)  Calcium 500 Mg Tabs (Calcium Carbonate) .... Tab By Mouth Once Daily 9)  Vitamin D3 400 Unit Tabs (Cholecalciferol) .... Tab By Mouth Once Daily 10)  Furosemide 40 Mg Tabs (Furosemide) .... As Needed 11)  Amiodarone Hcl 200 Mg Tabs (Amiodarone Hcl) .... One Tablet By Mouth Once Daily\par 12)  Metoprolol Tartrate 25 Mg Tabs (Metoprolol Tartrate) .Marland Kitchen.. 1 Tab Twice Daily 13)   Vitamin B-12 250 Mcg Tabs (Cyanocobalamin) .Marland Kitchen.. 1 Tab By Mouth Once Daily 14)  Stool Softner .... 2 Tab By Mouth Once Daily 15)  Gabapentin 100 Mg Caps (Gabapentin) .Marland Kitchen.. 1 Tab By Mouth Three Times A Day 16)  Hydrocodone-Acetaminophen 10-325 Mg Tabs (Hydrocodone-Acetaminophen) .... As Needed  Allergies (verified): 1)  ! Zocor  Past History:  Past Medical History: Last updated: 09/24/2008 Current Problems:  DIABETES MELLITUS (ICD-250.00) CAROTID BRUIT (ICD-785.9) MI (ICD-410.90):  Anterior with PCI LAD 07/2004 HYPERLIPIDEMIA (ICD-272.4) HYPERTENSION (ICD-401.9) PAROXYSMAL ATRIAL FIBRILLATION (ICD-427.31) CARDIOMYOPATHY (ICD-425.4) CAD (ICD-414.00) EDEMA (ICD-782.3) HYPERCHOLESTEROLEMIA (ICD-272.0)     Orthopedic  issues.  Past Surgical History: Last updated: 09/24/2008 Recent fracture of the right forearm.   left hip fracture, open reduction   internal fixation about 2 years ago.    angioplasty, cardioversion, Rod in left femur   Family History: Last updated: 09/24/2008   Positive for coronary artery disease in her sibling.   Social History: Last updated: 09/24/2008  She has a remote history of tobacco use, but has not   smoked in 15 years.  She does not consume alcohol.      Review of Systems       Denies fever, malais, weight loss, blurry vision, decreased visual acuity, cough, sputum, SOB, hemoptysis, pleuritic pain, palpitaitons, heartburn, abdominal pain, melena, lower extremity edema, claudication, or rash.   Vital Signs:  Patient profile:   77 year  old female Height:      65 inches Weight:      165 pounds BMI:     27.56 Pulse rate:   49 / minute Resp:     12 per minute BP sitting:   125 / 62  (left arm)  Vitals Entered By: Kem Parkinson (October 03, 2009 8:49 AM)  Physical Exam  General:  Affect appropriate Healthy:  appears stated age HEENT: normal Neck supple with no adenopathy JVP normal right  bruits no thyromegaly Lungs clear with no  wheezing and good diaphragmatic motion Heart:  S1/S2 no murmur,rub, gallop or click PMI normal Abdomen: benighn, BS positve, no tenderness, no AAA no bruit.  No HSM or HJR Distal pulses intact with no bruits Mild LLE  edema Neuro non-focal Skin warm and dry    Impression & Recommendations:  Problem # 1:  COUMADIN THERAPY (ICD-V58.61) F/U clinic today.  May need higher dose with less amiodarone  Problem # 2:  CAROTID BRUIT (ICD-785.9) F/U duplex scheduled for 7/5  Problem # 3:  HYPERLIPIDEMIA (ICD-272.4) At goal with no side effects Her updated medication list for this problem includes:    Lipitor 10 Mg Tabs (Atorvastatin calcium) .Marland Kitchen... 1/2 tab by mouth once daily    Niaspan 500 Mg Cr-tabs (Niacin (antihyperlipidemic)) .Marland Kitchen... Tab by mouth two times a day  CHOL (goal): 200 (10/11/2008)   LDL (goal): 70 (10/11/2008)   HDL (goal): 40 (10/11/2008)   TG (goal): 150 (10/11/2008)  Problem # 4:  HYPERTENSION (ICD-401.9) Well controlled Her updated medication list for this problem includes:    Aspirin 81 Mg Tabs (Aspirin) .Marland Kitchen... 1 tab by mouth once daily    Altace 10 Mg Caps (Ramipril) .Marland Kitchen... Tab by mouth once daily    Furosemide 40 Mg Tabs (Furosemide) .Marland Kitchen... As needed    Metoprolol Tartrate 25 Mg Tabs (Metoprolol tartrate) .Marland Kitchen... 1 tab twice daily  Problem # 5:  PAROXYSMAL ATRIAL FIBRILLATION (ICD-427.31) Maint NSR continue coumadin.  Decrease amiodarone Her updated medication list for this problem includes:    Coumadin 2.5 Mg Tabs (Warfarin sodium) .Marland Kitchen... Take as directed by coumadin clinic.    Aspirin 81 Mg Tabs (Aspirin) .Marland Kitchen... 1 tab by mouth once daily    Amiodarone Hcl 200 Mg Tabs (Amiodarone hcl) ..... One tablet by mouth once daily\    Metoprolol Tartrate 25 Mg Tabs (Metoprolol tartrate) .Marland Kitchen... 1 tab twice daily  Orders: TLB-CBC Platelet - w/Differential (85025-CBCD) TLB-Hepatic/Liver Function Pnl (80076-HEPATIC) TLB-TSH (Thyroid Stimulating Hormone) (04540-JWJ) Pulmonary  Function Test (PFT)  Problem # 6:  MI (ICD-410.90) Stable no angina previous stent LD 2006 Her updated medication list for this problem includes:    Coumadin 2.5 Mg Tabs (Warfarin sodium) .Marland Kitchen... Take as directed by coumadin clinic.    Aspirin 81 Mg Tabs (Aspirin) .Marland Kitchen... 1 tab by mouth once daily    Altace 10 Mg Caps (Ramipril) .Marland Kitchen... Tab by mouth once daily    Metoprolol Tartrate 25 Mg Tabs (Metoprolol tartrate) .Marland Kitchen... 1 tab twice daily  Other Orders: TLB-BMP (Basic Metabolic Panel-BMET) (80048-METABOL)  Patient Instructions: 1)  Your physician recommends that you schedule a follow-up appointment in: 6 MONTHS 2)  Your physician has recommended you make the following change in your medication: DECREASE AMIODARONE 200MG  ONCE DAILY 3)  Your physician has requested that you have a carotid duplex. This test is an ultrasound of the carotid arteries in your neck. It looks at blood flow through these arteries that supply the brain with blood. Allow one hour  for this exam. There are no restrictions or special instructions. JULY 5 AT 11AM 4)  Your physician has recommended that you have a pulmonary function test.  Pulmonary Function Tests are a group of tests that measure how well air moves in and out of your lungs.WITH DLCO Prescriptions: AMIODARONE HCL 200 MG TABS (AMIODARONE HCL) one tablet by mouth once daily #90 x 4   Entered by:   Deliah Goody, RN   Authorized by:   Colon Branch, MD, [facility]   Signed by:   Deliah Goody, RN on 10/03/2009   Method used:   Faxed to ...       [facility] (mail-order)       [address]       [place], [place]  [id number]       Ph: [phone]       Fax: [phone]   RxID:   [id number]

## 2010-05-23 NOTE — Medication Information (Signed)
Summary: Coumadin Clinic  Anticoagulant Therapy  Managed by: Cloyde Reams, RN, BSN Referring MD: Charlton Haws MD Supervising MD: Jens Som MD, Arlys John Indication 1: Atrial Fibrillation (ICD-427.31) Indication 2: DCCV Pending (ICD-000.00) Lab Used: [facility] Site: [facility] PT 43.5 INR POC 3.6 INR RANGE 2 - 3  Dietary changes: yes       Details: Decr appetite, eating minimally.   Bleeding/hemorrhagic complications: no     Any changes in medication regimen? no     Any missed doses?: no        Comments: Husband sick from chemo at home, pt is back bedroom with backpain.    Allergies: 1)  ! Zocor  Anticoagulation Management History:      Her anticoagulation is being managed by telephone today.  Positive risk factors for bleeding include an age of 75 years or older and presence of serious comorbidities.  The bleeding index is 'intermediate risk'.  Positive CHADS2 values include History of HTN, Age > 67 years old, and History of Diabetes.  The start date was 04/11/2004.  Prothrombin time is 43.5.  Anticoagulation responsible provider: Jens Som MD, Arlys John.  INR POC: 3.6.  Exp: 05/2010.    Anticoagulation Management Assessment/Plan:      The patient's current anticoagulation dose is Coumadin 2.5 mg tabs: Take as directed by coumadin clinic..  The target INR is 2 - 3.  The next INR is due 05/12/2009.  Anticoagulation instructions were given to [facility], RN.  Results were reviewed/authorized by Cloyde Reams, RN, BSN.  She was notified by Cloyde Reams RN.         Prior Anticoagulation Instructions: INR 2.22  Spoke with [facility] RN, continue on same dosage 1.25mg  daily except 2.5mg  on MWF.  Per hospital orders [facility] will redraw Monday 05/09/09.    Current Anticoagulation Instructions: INR 3.6  Spoke with Dennie Bible, [facility] RN while at pt's home.  Advised to hold tonight's dosage then decr dosage to 1.25mg  daily except 2.5mg  on Mondays and Fridays.  Recheck on 05/12/09 when scheduled back at pt's  home for [facility] visit, next scheduled visit is for 05/18/09.

## 2010-05-23 NOTE — Letter (Signed)
Summary: Appointment - Reminder 2  [employer], Main Office  [address]   [place], [place] [address]   Phone: [phone]  Fax: [phone]     August 03, 2009 MRN: [id number]   [facility] [address] [place], [place]  [address]   Dear [name],  Our records indicate that it is time to schedule a follow-up appointment with Dr. Eden Emms. It is very important that we reach you to schedule this appointment. We look forward to participating in your health care needs. Please contact us at the number listed above at your earliest convenience to schedule your appointment.  If you are unable to make an appointment at this time, give [place] a call so we can update our records.     Sincerely,   Migdalia Dk [facility] Scheduling Team

## 2010-05-23 NOTE — Medication Information (Signed)
Summary: Coumadin Clinic  Anticoagulant Therapy  Managed by: Cloyde Reams, RN, BSN Referring MD: Charlton Haws MD Supervising MD: Clifton James MD, Cristal Deer Indication 1: Atrial Fibrillation (ICD-427.31) Indication 2: DCCV Pending (ICD-000.00) Lab Used: [facility] Site: [facility] PT 36.1 INR POC 3.0 INR RANGE 2 - 3  Dietary changes: yes       Details: Poor appetite, minimal intake of vit K rich foods.   Bleeding/hemorrhagic complications: no     Any changes in medication regimen? no     Any missed doses?: no        Comments: Pt has 2.5mg  tablets and is currently on 1/2 tablet daily. May have to change tablet size if INR continues to rise.  Allergies: 1)  ! Zocor  Anticoagulation Management History:      Her anticoagulation is being managed by telephone today.  Positive risk factors for bleeding include an age of 16 years or older and presence of serious comorbidities.  The bleeding index is 'intermediate risk'.  Positive CHADS2 values include History of HTN, Age > 16 years old, and History of Diabetes.  The start date was 04/11/2004.  Prothrombin time is 36.1.  Anticoagulation responsible provider: Clifton James MD, Cristal Deer.  INR POC: 3.0.  Exp: 05/2010.    Anticoagulation Management Assessment/Plan:      The patient's current anticoagulation dose is Coumadin 2.5 mg tabs: Take as directed by coumadin clinic..  The target INR is 2 - 3.  The next INR is due 06/28/2009.  Anticoagulation instructions were given to [facility], RN.  Results were reviewed/authorized by Cloyde Reams, RN, BSN.  She was notified by Cloyde Reams RN.         Prior Anticoagulation Instructions: INR 2.5  Continue current dosing schedule of 1/2 tablet daily.  Recheck in 1 week on 06/21/09.  Orders given to [name], [facility] RN   Current Anticoagulation Instructions: INR 3.0  Spoke with Joanne Bible while at pt's home.  Advised to have pt continue on same dosage and encourage her to incorporate some vit K foods  into her diet regularly.  Recheck in 1 week.

## 2010-05-23 NOTE — Medication Information (Signed)
Summary: Coumadin Clinic  Anticoagulant Therapy  Managed by: Cloyde Reams, RN, BSN Referring MD: Charlton Haws MD Supervising MD: Juanda Chance MD, Pravin Perezperez Indication 1: Atrial Fibrillation (ICD-427.31) Indication 2: DCCV Pending (ICD-000.00) Lab Used: [facility] Site: [facility] PT 40.6 INR POC 3.4 INR RANGE 2 - 3  Dietary changes: yes       Details: Decr appetite.   Bleeding/hemorrhagic complications: no     Any changes in medication regimen? no     Any missed doses?: no         Allergies: 1)  ! Zocor  Anticoagulation Management History:      Her anticoagulation is being managed by telephone today.  Positive risk factors for bleeding include an age of 75 years or older and presence of serious comorbidities.  The bleeding index is 'intermediate risk'.  Positive CHADS2 values include History of HTN, Age > 69 years old, and History of Diabetes.  The start date was 04/11/2004.  Prothrombin time is 40.6.  Anticoagulation responsible provider: Juanda Chance MD, Smitty Cords.  INR POC: 3.4.  Exp: 05/2010.    Anticoagulation Management Assessment/Plan:      The patient's current anticoagulation dose is Coumadin 2.5 mg tabs: Take as directed by coumadin clinic..  The target INR is 2 - 3.  The next INR is due 05/27/2009.  Anticoagulation instructions were given to [facility], RN.  Results were reviewed/authorized by Cloyde Reams, RN, BSN.  She was notified by Cloyde Reams RN.         Prior Anticoagulation Instructions: INR 3.0 Change dose to 1.25mg s daily except 2.5mg s on M&F. Orders and dose given to Ardis Hughs [facility] nurse while at home with pt. Recheck in one week.   Current Anticoagulation Instructions: INR 3.4  Spoke with Dennie Bible, [facility] RN.  Advised to have pt hold today's dose of coumadin then decr dosage to 1/2 tablet daily except 1 tablet on Mondays.  Recheck in 10 days on 05/27/09.

## 2010-05-23 NOTE — Medication Information (Signed)
Summary: Coumadin Clinic  Anticoagulant Therapy  Managed by: Eda Keys, PharmD Referring MD: Charlton Haws MD Supervising MD: Ladona Ridgel MD, Sharlot Gowda Indication 1: Atrial Fibrillation (ICD-427.31) Indication 2: DCCV Pending (ICD-000.00) Lab Used: [facility] Site: [facility] PT 30.4 INR POC 2.5 INR RANGE 2 - 3  Dietary changes: no    Health status changes: no    Bleeding/hemorrhagic complications: no    Recent/future hospitalizations: no    Any changes in medication regimen? no    Recent/future dental: no  Any missed doses?: no       Is patient compliant with meds? yes      Comments: completed course of famvir and medrol this past friday (06/10/09), pt will be [facility] from [facility] on [facility]/29/09.  Allergies: 1)  ! Zocor  Anticoagulation Management History:      Her anticoagulation is being managed by telephone today.  Positive risk factors for bleeding include an age of 75 years or older and presence of serious comorbidities.  The bleeding index is 'intermediate risk'.  Positive CHADS2 values include History of HTN, Age > 56 years old, and History of Diabetes.  The start date was 04/11/2004.  Prothrombin time is 30.4.  Anticoagulation responsible provider: Ladona Ridgel MD, Sharlot Gowda.  INR POC: 2.5.  Exp: 05/2010.    Anticoagulation Management Assessment/Plan:      The patient's current anticoagulation dose is Coumadin 2.5 mg tabs: Take as directed by coumadin clinic..  The target INR is 2 - 3.  The next INR is due 06/21/2009.  Anticoagulation instructions were given to [facility], RN.  Results were reviewed/authorized by Eda Keys, PharmD.  She was notified by Eda Keys, PharmD.         Prior Anticoagulation Instructions: INR 4.5  Spoke with Dennie Bible, [facility], RN while at pt's home advised to hold x 2 doses then restart 1/2 tablet daily.  Pt will continue on Famvir and Medrol until Friday.  Recheck PT/INR in 1 week.    Current Anticoagulation Instructions: INR 2.5  Continue current  dosing schedule of 1/2 tablet daily.  Recheck in 1 week on 06/21/09.  Orders given to Ardis Hughs, [facility] RN

## 2010-05-23 NOTE — Medication Information (Signed)
Summary: rov/sp  Anticoagulant Therapy  Managed by: Bethena Midget, RN, BSN Referring MD: Charlton Haws MD PCP: Dr. Dagoberto Ligas Supervising MD: Graciela Husbands MD, Viviann Spare Indication 1: Atrial Fibrillation (ICD-427.31) Indication 2: DCCV Pending (ICD-000.00) Lab Used: [facility] Site: [facility] INR POC 1.8 INR RANGE 2 - 3  Dietary changes: yes       Details: Eating less green leafy veggies  Health status changes: no    Bleeding/hemorrhagic complications: no    Recent/future hospitalizations: no    Any changes in medication regimen? no    Recent/future dental: no  Any missed doses?: yes     Details: missed 2  doses during this past week.  Is patient compliant with meds? yes       Allergies: 1)  ! Zocor  Anticoagulation Management History:      The patient is taking warfarin and comes in today for a routine follow up visit.  Positive risk factors for bleeding include an age of 75 years or older and presence of serious comorbidities.  The bleeding index is 'intermediate risk'.  Positive CHADS2 values include History of HTN, Age > 44 years old, and History of Diabetes.  The start date was 04/11/2004.  Anticoagulation responsible provider: Graciela Husbands MD, Viviann Spare.  INR POC: 1.8.  Cuvette Lot#: [id number].  Exp: 12/2010.    Anticoagulation Management Assessment/Plan:      The patient's current anticoagulation dose is Coumadin 2.5 mg tabs: Take as directed by coumadin clinic..  The target INR is 2 - 3.  The next INR is due 11/22/2009.  Anticoagulation instructions were given to patient.  Results were reviewed/authorized by Bethena Midget, RN, BSN.  She was notified by Bethena Midget, RN, BSN.         Prior Anticoagulation Instructions: INR 2.3  Take 1 tablet today then resume same dose of 1/2 tablet every day.    Current Anticoagulation Instructions: INR 1.8 Today take 1pill then resume 1/2 pill everyday. Recheck in 4 weeks.

## 2010-05-23 NOTE — Medication Information (Signed)
Summary: rov/ewj  Anticoagulant Therapy  Managed by: Weston Brass, PharmD Candidate Referring MD: Charlton Haws MD PCP: Dr. Dagoberto Ligas Supervising MD: Jens Som MD, Arlys John Indication 1: Atrial Fibrillation (ICD-427.31) Indication 2: DCCV Pending (ICD-000.00) Lab Used[facility]  Site: [facility] INR POC 2.2 INR RANGE 2 - 3  Dietary changes: no    Health status changes: no    Bleeding/hemorrhagic complications: no    Recent/future hospitalizations: no    Any changes in medication regimen? no    Recent/future dental: no  Any missed doses?: no       Is patient compliant with meds? yes       Allergies: 1)  ! Zocor  Anticoagulation Management History:      The patient is taking warfarin and comes in today for a routine follow up visit.  Positive risk factors for bleeding include an age of 75 years or older and presence of serious comorbidities.  The bleeding index is 'intermediate risk'.  Positive CHADS2 values include History of HTN, Age > 11 years old, and History of Diabetes.  The start date was 04/11/2004.  Anticoagulation responsible provider: Jens Som MD, Arlys John.  INR POC: 2.2.  Cuvette Lot#: [id number].  Exp: 01/2011.    Anticoagulation Management Assessment/Plan:      The patient's current anticoagulation dose is Coumadin 2.5 mg tabs: Take as directed by coumadin clinic..  The target INR is 2 - 3.  The next INR is due 01/18/2010.  Anticoagulation instructions were given to patient.  Results were reviewed/authorized by Weston Brass, PharmD Candidate.  She was notified by Gweneth Fritter, PharmD Candidate.         Prior Anticoagulation Instructions: INR 2.3  Continue on same dosage 1/2 tablet daily.  Rechekc in 4 weeks.   Current Anticoagulation Instructions: INR 2.2  Continue taking 1/2 tablet (1.25mg ) every day.  Recheck in 4 weeks.

## 2010-05-23 NOTE — Medication Information (Signed)
Summary: rov/sp  Anticoagulant Therapy  Managed by: Weston Brass, PharmD Referring MD: Charlton Haws MD Supervising MD: Eden Emms MD, Theron Arista Indication 1: Atrial Fibrillation (ICD-427.31) Indication 2: DCCV Pending (ICD-000.00) Lab Used: [facility] Site: [facility] INR POC 2.3 INR RANGE 2 - 3  Dietary changes: no    Health status changes: no    Bleeding/hemorrhagic complications: no    Recent/future hospitalizations: no    Any changes in medication regimen? yes       Details: decreased amiodarone to 200mg  daily   Recent/future dental: no  Any missed doses?: yes     Details: missed dose on Saturday  Is patient compliant with meds? yes       Allergies: 1)  ! Zocor  Anticoagulation Management History:      The patient is taking warfarin and comes in today for a routine follow up visit.  Positive risk factors for bleeding include an age of 75 years or older and presence of serious comorbidities.  The bleeding index is 'intermediate risk'.  Positive CHADS2 values include History of HTN, Age > 75 years old, and History of Diabetes.  The start date was 04/11/2004.  Anticoagulation responsible provider: Eden Emms MD, Theron Arista.  INR POC: 2.3.  Cuvette Lot#: [id number].  Exp: 09/2010.    Anticoagulation Management Assessment/Plan:      The patient's current anticoagulation dose is Coumadin 2.5 mg tabs: Take as directed by coumadin clinic..  The target INR is 2 - 3.  The next INR is due 10/25/2009.  Anticoagulation instructions were given to [facility], RN.  Results were reviewed/authorized by Weston Brass, PharmD.  She was notified by Weston Brass PharmD.         Prior Anticoagulation Instructions: INR 2.1  Continue same dose of 1/2 tablet every day   Current Anticoagulation Instructions: INR 2.3  Take 1 tablet today then resume same dose of 1/2 tablet every day.

## 2010-05-23 NOTE — Progress Notes (Signed)
Summary: pt needs refill   Phone Note Refill Request Call back at Home Phone [phone] Message from:  Patient on [facility] [address]  Refills Requested: Medication #1:  METOPROLOL TARTRATE 25 MG TABS 1 TAB TWICE DAILY  Medication #2:  NIASPAN 500 MG CR-TABS tab by mouth two times a day Initial call taken by: Omer Jack,  March 01, 2010 11:19 AM    New/Updated Medications: NIASPAN 500 MG CR-TABS (NIACIN (ANTIHYPERLIPIDEMIC)) 1 tab by mouth two times a day Prescriptions: METOPROLOL TARTRATE 25 MG TABS (METOPROLOL TARTRATE) 1 TAB TWICE DAILY  #180 x 3   Entered by:   Kem Parkinson   Authorized by:   Colon Branch, MD, [facility]   Signed by:   Kem Parkinson on 03/01/2010   Method used:   Electronically to        [facility]  [phone]* (retail)       [address]       [place], [place]  [address]       Ph: [phone] or [phone]       Fax: [phone]   RxID:   [id number] NIASPAN 500 MG CR-TABS (NIACIN (ANTIHYPERLIPIDEMIC)) 1 tab by mouth two times a day  #180 x 3   Entered by:   Kem Parkinson   Authorized by:   Colon Branch, MD, [facility]   Signed by:   Kem Parkinson on 03/01/2010   Method used:   Electronically to        [facility]  [phone]* (retail)       [address]       [place], [place]  [address]       Ph: [phone] or [phone]       Fax: [phone]   RxID:   [id number]

## 2010-05-23 NOTE — Medication Information (Signed)
Summary: Coumadin Clinic  Anticoagulant Therapy  Managed by: Cloyde Reams, RN, BSN Referring MD: Charlton Haws MD Supervising MD: Johney Frame MD, Fayrene Fearing Indication 1: Atrial Fibrillation (ICD-427.31) Indication 2: DCCV Pending (ICD-000.00) Lab Used: [facility] Site: [facility] PT 54.4 INR POC 4.5 INR RANGE 2 - 3  Dietary changes: no    Health status changes: yes       Details: dx with shingles.  Bleeding/hemorrhagic complications: no    Recent/future hospitalizations: no    Any changes in medication regimen? yes       Details: Famvir 500mg  tid started on 05/31/09-06/10/09 and Medrol 4mg  dose pak ending on Friday.   Recent/future dental: no  Any missed doses?: no       Is patient compliant with meds? yes       Allergies: 1)  ! Zocor  Anticoagulation Management History:      Her anticoagulation is being managed by telephone today.  Positive risk factors for bleeding include an age of 75 years or older and presence of serious comorbidities.  The bleeding index is 'intermediate risk'.  Positive CHADS2 values include History of HTN, Age > 75 years old, and History of Diabetes.  The start date was 04/11/2004.  Prothrombin time is 54.4.  Anticoagulation responsible provider: Davie Sagona MD, Fayrene Fearing.  INR POC: 4.5.  Exp: 05/2010.    Anticoagulation Management Assessment/Plan:      The patient's current anticoagulation dose is Coumadin 2.5 mg tabs: Take as directed by coumadin clinic..  The target INR is 2 - 3.  The next INR is due 06/15/2009.  Anticoagulation instructions were given to [facility], RN.  Results were reviewed/authorized by Cloyde Reams, RN, BSN.  She was notified by Cloyde Reams RN.         Prior Anticoagulation Instructions: INR 2.7  Spoke with Dennie Bible, RN while at pt's home.  Instructed to have pt start taking 2.5mg  daily.   Recheck on 06/08/09.  Current Anticoagulation Instructions: INR 4.5  Spoke with Dennie Bible, [facility], RN while at pt's home advised to hold x 2 doses then restart 1/2  tablet daily.  Pt will continue on Famvir and Medrol until Friday.  Recheck PT/INR in 1 week.

## 2010-05-23 NOTE — Miscellaneous (Signed)
Summary: [facility]  [facility]   Imported By: Roderic Ovens 07/07/2009 11:51:54  _____________________________________________________________________  External Attachment:    Type:   Image     Comment:   External Document

## 2010-05-23 NOTE — Miscellaneous (Signed)
Summary: [facility]   [facility]   Imported By: Roderic Ovens 05/19/2009 11:49:09  _____________________________________________________________________  External Attachment:    Type:   Image     Comment:   External Document

## 2010-05-24 ENCOUNTER — Ambulatory Visit: Admit: 2010-05-24 | Payer: Self-pay

## 2010-05-25 NOTE — Medication Information (Signed)
Summary: rov/nb  Anticoagulant Therapy  Managed by: Weston Brass, PharmD Referring MD: Charlton Haws MD PCP: Dr. Maryjane Hurter MD: Patty Sermons Indication 1: Atrial Fibrillation (ICD-427.31) Indication 2: DCCV Pending (ICD-000.00) Lab Used: [facility] Site: [facility] INR POC 1.8 INR RANGE 2 - 3  Dietary changes: no    Health status changes: no    Bleeding/hemorrhagic complications: no    Recent/future hospitalizations: no    Any changes in medication regimen? no    Recent/future dental: no  Any missed doses?: yes     Details: unsure if missed any doses or not  Is patient compliant with meds? yes       Allergies: 1)  ! Zocor  Anticoagulation Management History:      The patient is taking warfarin and comes in today for a routine follow up visit.  Positive risk factors for bleeding include an age of 75 years or older and presence of serious comorbidities.  The bleeding index is 'intermediate risk'.  Positive CHADS2 values include History of HTN, Age > 75 years old, and History of Diabetes.  The start date was 04/11/2004.  Anticoagulation responsible Violeta Lecount: [place].  INR POC: 1.8.  Exp: 03/2011.    Anticoagulation Management Assessment/Plan:      The patient's current anticoagulation dose is Coumadin 2.5 mg tabs: Take as directed by coumadin clinic..  The target INR is 2 - 3.  The next INR is due 04/26/2010.  Anticoagulation instructions were given to patient.  Results were reviewed/authorized by Weston Brass, PharmD.  She was notified by Weston Brass PharmD.         Prior Anticoagulation Instructions: INR 1.6 Take 1 tablet today and tomorrow continue previous dose of 0.5 tablet everyday  Recheck INR in 3 weeks  Current Anticoagulation Instructions: INR 1.8  Take 1 tablet tonight then start taking 1/2 tablet every day in the morning.  Recheck INR in 3 weeks.

## 2010-05-25 NOTE — Medication Information (Signed)
Summary: rov/sp  Anticoagulant Therapy  Managed by: Weston Brass, PharmD Referring MD: Charlton Haws MD PCP: Dr. Dagoberto Ligas Supervising MD: Johney Frame MD, Fayrene Fearing Indication 1: Atrial Fibrillation (ICD-427.31) Indication 2: DCCV Pending (ICD-000.00) Lab Used: [facility] Site: [facility] INR POC 1.8 INR RANGE 2 - 3  Dietary changes: yes       Details: Extra serving of Vit K   Health status changes: no    Bleeding/hemorrhagic complications: no    Recent/future hospitalizations: no    Any changes in medication regimen? no    Recent/future dental: no  Any missed doses?: no       Is patient compliant with meds? yes       Allergies: 1)  ! Zocor  Anticoagulation Management History:      The patient is taking warfarin and comes in today for a routine follow up visit.  Positive risk factors for bleeding include an age of 75 years or older and presence of serious comorbidities.  The bleeding index is 'intermediate risk'.  Positive CHADS2 values include History of HTN, Age > 56 years old, and History of Diabetes.  The start date was 04/11/2004.  Anticoagulation responsible provider: Arion Morgan MD, Fayrene Fearing.  INR POC: 1.8.  Cuvette Lot#: [id number].  Exp: 05/2011.    Anticoagulation Management Assessment/Plan:      The patient's current anticoagulation dose is Coumadin 2.5 mg tabs: Take as directed by coumadin clinic..  The target INR is 2 - 3.  The next INR is due 05/24/2010.  Anticoagulation instructions were given to patient.  Results were reviewed/authorized by Weston Brass, PharmD.  She was notified by Stephannie Peters, PharmD Candidate .         Prior Anticoagulation Instructions: INR 1.8  Take 1 tablet tonight then start taking 1/2 tablet every day in the morning.  Recheck INR in 3 weeks.   Current Anticoagulation Instructions: INR 1.8  Coumadin 2.5 mg tablets - Take 1/2 tablet every day except 1 tablet on Wednesdays

## 2010-05-25 NOTE — Progress Notes (Signed)
Summary: Nuclear Pre-procedure  Phone Note Outgoing Call Call back at [facility] Phone [phone]   Call placed by: Stanton Kidney, EMT-P,  May 02, 2010 2:12 PM Call placed to: Patient Action Taken: Phone Call Completed Summary of Call: Reviewed information on Myoview Information Sheet (see scanned document for further details).  Spoke with the patient. Stanton Kidney, EMT-P  May 02, 2010 2:57 PM     Nuclear Med Background Indications for Stress Test: Evaluation for Ischemia, Stent Patency, PTCA Patency   History: Echo, GXT, Heart Catheterization, Myocardial Infarction, Myocardial Perfusion Study, Stents  History Comments: 11/98 MI 2/06 Heart Cath > LAD Stent 3/06 GXT 4/07 MPS: EF= 50% 3/08 Echo: Ef= 40-50%  Symptoms: Chest Pain    Nuclear Pre-Procedure Cardiac Risk Factors: Family History - CAD, History of Smoking, Hypertension, Lipids, NIDDM Height (in): 65  Nuclear Med Study Referring MD:  Charlton Haws MD

## 2010-05-25 NOTE — Assessment & Plan Note (Signed)
Summary: Cardiology Nuclear Testing  Nuclear Med Background Indications for Stress Test: Evaluation for Ischemia, Stent Patency, PTCA Patency   History: Cardioversion, Echo, GXT, Heart Catheterization, Myocardial Infarction, Myocardial Perfusion Study, Stents  History Comments: 11/98 MI 2/06 Heart Cath > LAD Stent 3/06 GXT 4/07 MPS: EF= 50% 3/08 Echo: Ef= 40-50% 7/10 Cardioversion.  Symptoms: Chest Pain, Chest Pressure  Symptoms Comments: CP last night.   Nuclear Pre-Procedure Cardiac Risk Factors: Family History - CAD, History of Smoking, Hypertension, Lipids, NIDDM Caffeine/Decaff Intake: None NPO After: 8:30 PM Lungs: Clear IV 0.9% NS with Angio Cath: 22g     IV Site: R Antecubital IV Started by: Bonnita Levan, RN Chest Size (in) 42     Cup Size B     Height (in): 65 Weight (lb): 163 BMI: 27.22 Tech Comments: Held metoprolol this am.  Nuclear Med Study 1 or 2 day study:  1 day     Stress Test Type:  Lexiscan Reading MD:  Cassell Clement, MD     Referring MD:  Charlton Haws MD Resting Radionuclide:  Technetium 74m Tetrofosmin     Resting Radionuclide Dose:  10.9 mCi  Stress Radionuclide:  Technetium 25m Tetrofosmin     Stress Radionuclide Dose:  33 mCi   Stress Protocol      Max HR:  62 bpm     Predicted Max HR:  142 bpm  Max Systolic BP: 143 mm Hg     Percent Max HR:  43.66 %Rate Pressure Product:  8866  Lexiscan: 0.4 mg   Stress Test Technologist:  Bonnita Levan, RN     Nuclear Technologist:  Domenic Polite, CNMT  Rest Procedure  Myocardial perfusion imaging was performed at rest 45 minutes following the intravenous administration of Technetium 34m Tetrofosmin.  Stress Procedure  The patient received IV Lexiscan 0.4 mg over 15-seconds.  Technetium 63m Tetrofosmin injected at 30-seconds.  There were no significant changes with lexiscan. The patient complained of CP,8/10 with injection,which resolved in recovery, occ. PAC's noted. Quantitative spect images were  obtained after a 45 minute delay.  QPS Raw Data Images:  Normal; no motion artifact; normal heart/lung ratio. Stress Images:  There is decreased uptake in the anteroseptal wall and apex Rest Images:  There is decreased uptake in the anteroseptal wall and apex Subtraction (SDS):  There is a fixed defect that is most consistent with a previous infarction. Transient Ischemic Dilatation:  0.98  (Normal <1.22)  Lung/Heart Ratio:  0.29  (Normal <0.45)  Quantitative Gated Spect Images QGS EDV:  141 ml QGS ESV:  80 ml QGS EF:  43 % QGS cine images:  Septal and apical hypokinesis.  Findings Low risk nuclear study Clinically Abnormal (chest pain, ST abnormality, hypotension)  Evidence for anterior (septal apical) infarct  Evidence for LV Dysfunction LV Dysfunction    Overall Impression  Exercise Capacity: Lexiscan with no exercise. BP Response: Normal blood pressure response. Clinical Symptoms: Mild chest pain/dyspnea. ECG Impression: No significant ST segment change suggestive of ischemia. Overall Impression: Old large infarct involving the anteroseptal wall and apex.  No reversible ischemia. Overall Impression Comments: Since last Myoview study of 07/23/05, EF has decreased from 50% to 43%  Appended Document: Cardiology Nuclear Testing low risk no ischemia continue medical Rx  F/U me 3 months  Appended Document: Cardiology Nuclear Testing n/a x1 ./cy  Appended Document: Cardiology Nuclear Testing pt aware of results, dtr to call back to make appt

## 2010-06-05 DIAGNOSIS — I4891 Unspecified atrial fibrillation: Secondary | ICD-10-CM

## 2010-06-07 ENCOUNTER — Encounter: Payer: Self-pay | Admitting: Cardiology

## 2010-06-07 ENCOUNTER — Encounter (INDEPENDENT_AMBULATORY_CARE_PROVIDER_SITE_OTHER): Payer: Medicare PPO

## 2010-06-07 DIAGNOSIS — I4891 Unspecified atrial fibrillation: Secondary | ICD-10-CM

## 2010-06-07 DIAGNOSIS — Z7901 Long term (current) use of anticoagulants: Secondary | ICD-10-CM

## 2010-06-14 NOTE — Medication Information (Signed)
Summary: Coumadin Clinic  Anticoagulant Therapy  Managed by: Weston Brass, PharmD Referring MD: Charlton Haws MD PCP: Dr. Dagoberto Ligas Supervising MD: Shirlee Latch MD, Freida Busman Indication 1: Atrial Fibrillation (ICD-427.31) Indication 2: DCCV Pending (ICD-000.00) Lab Used: [facility] Site: [facility] INR POC 1.9 INR RANGE 2 - 3  Dietary changes: no    Health status changes: no    Bleeding/hemorrhagic complications: no    Recent/future hospitalizations: no    Any changes in medication regimen? no    Recent/future dental: no  Any missed doses?: yes     Details: missed one dose about 1 week ago  Is patient compliant with meds? yes       Allergies: 1)  ! Zocor  Anticoagulation Management History:      The patient is taking warfarin and comes in today for a routine follow up visit.  Positive risk factors for bleeding include an age of 24 years or older and presence of serious comorbidities.  The bleeding index is 'intermediate risk'.  Positive CHADS2 values include History of HTN, Age > 17 years old, and History of Diabetes.  The start date was 04/11/2004.  Anticoagulation responsible provider: Shirlee Latch MD, Manahil Vanzile.  INR POC: 1.9.  Cuvette Lot#: [id number].  Exp: 04/2011.    Anticoagulation Management Assessment/Plan:      The patient's current anticoagulation dose is Coumadin 2.5 mg tabs: Take as directed by coumadin clinic..  The target INR is 2 - 3.  The next INR is due 06/21/2010.  Anticoagulation instructions were given to patient.  Results were reviewed/authorized by Weston Brass, PharmD.  She was notified by Margot Chimes PharmD Candidate.         Prior Anticoagulation Instructions: INR 1.8  Coumadin 2.5 mg tablets - Take 1/2 tablet every day except 1 tablet on Wednesdays    Current Anticoagulation Instructions: INR 1.9  Today we increased your dose.  Your new schedule is 1/2 tablet everyday except on Wednesdays and Fridays when you take 1 whole tablet. Try not to miss any doses  during the next 2 weeks.

## 2010-06-21 ENCOUNTER — Encounter: Payer: Self-pay | Admitting: Cardiovascular Disease

## 2010-06-21 ENCOUNTER — Telehealth: Payer: Self-pay | Admitting: Cardiovascular Disease

## 2010-06-21 ENCOUNTER — Encounter (INDEPENDENT_AMBULATORY_CARE_PROVIDER_SITE_OTHER): Payer: Medicare PPO

## 2010-06-21 ENCOUNTER — Emergency Department (HOSPITAL_BASED_OUTPATIENT_CLINIC_OR_DEPARTMENT_OTHER)
Admission: EM | Admit: 2010-06-21 | Discharge: 2010-06-21 | Disposition: A | Discharge status: Home / Self Care | Payer: Medicare PPO | Attending: Emergency Medicine | Admitting: Emergency Medicine

## 2010-06-21 ENCOUNTER — Emergency Department (INDEPENDENT_AMBULATORY_CARE_PROVIDER_SITE_OTHER): Payer: Medicare PPO

## 2010-06-21 DIAGNOSIS — Z7901 Long term (current) use of anticoagulants: Secondary | ICD-10-CM

## 2010-06-21 DIAGNOSIS — I10 Essential (primary) hypertension: Secondary | ICD-10-CM | POA: Insufficient documentation

## 2010-06-21 DIAGNOSIS — G8929 Other chronic pain: Secondary | ICD-10-CM | POA: Insufficient documentation

## 2010-06-21 DIAGNOSIS — R002 Palpitations: Secondary | ICD-10-CM

## 2010-06-21 DIAGNOSIS — I4891 Unspecified atrial fibrillation: Secondary | ICD-10-CM

## 2010-06-21 DIAGNOSIS — I517 Cardiomegaly: Secondary | ICD-10-CM | POA: Insufficient documentation

## 2010-06-21 DIAGNOSIS — Z79899 Other long term (current) drug therapy: Secondary | ICD-10-CM | POA: Insufficient documentation

## 2010-06-21 DIAGNOSIS — M810 Age-related osteoporosis without current pathological fracture: Secondary | ICD-10-CM | POA: Insufficient documentation

## 2010-06-21 LAB — CBC
HCT: 41.2 % (ref 36.0–46.0)
Hemoglobin: 14 g/dL (ref 12.0–15.0)
MCH: 32.1 pg (ref 26.0–34.0)
MCHC: 34 g/dL (ref 30.0–36.0)
MCV: 94.5 fL (ref 78.0–100.0)

## 2010-06-21 LAB — DIFFERENTIAL
Basophils Relative: 0 % (ref 0–1)
Lymphs Abs: 1.8 10*3/uL (ref 0.7–4.0)
Monocytes Absolute: 0.7 10*3/uL (ref 0.1–1.0)
Monocytes Relative: 10 % (ref 3–12)
Neutro Abs: 4.7 10*3/uL (ref 1.7–7.7)

## 2010-06-21 LAB — BASIC METABOLIC PANEL
CO2: 26 mEq/L (ref 19–32)
Chloride: 107 mEq/L (ref 96–112)
Creatinine, Ser: 0.7 mg/dL (ref 0.4–1.2)
GFR calc Af Amer: >60 mL/min (ref >60)

## 2010-06-21 LAB — CONVERTED CEMR LAB: POC INR: 2

## 2010-06-21 LAB — POCT CARDIAC MARKERS: Myoglobin, poc: 42.8 ng/mL (ref 12–200)

## 2010-06-22 ENCOUNTER — Ambulatory Visit: Payer: Medicare PPO | Admitting: Physician Assistant

## 2010-06-29 ENCOUNTER — Encounter: Payer: Self-pay | Admitting: Family Medicine

## 2010-06-29 NOTE — Progress Notes (Signed)
Summary: c/o heart out of rhythm  Phone Note Call from Patient Call back at Home Phone [phone]   Caller: Daughter in law- mindy [phone] Reason for Call: Talk to Nurse Summary of Call: c/o heart is out of rhythm, tired .  Initial call taken by: Lorne Skeens,  June 21, 2010 4:45 PM  Follow-up for Phone Call        Spoke with Mindy who reports pt has felt sluggish and dragging for last day or so. Also with cough that comes and goes which has been on going for a long time. Pt usually feels this way when heart is out of rhythm. Does not know what pt's heart rate is.  Pt taking all medications as prescribed.  Discussed with Dr. Eden Emms who would like pt to see Tereso Newcomer, PA tomorrow or Friday. Appt made for pt to see Tereso Newcomer, PA tomorrow at 8:30.  Pt aware if symptoms were to increase prior to appt she should go to ED. Follow-up by: Dossie Arbour, RN, BSN,  June 21, 2010 5:26 PM

## 2010-06-29 NOTE — Medication Information (Signed)
Summary: rov/ewj  Anticoagulant Therapy  Managed by: Leota Sauers, PharmD, BCPS, CPP Referring MD: Charlton Haws MD PCP: Dr. Maryjane Hurter MD: Shirlee Latch MD, Freida Busman Indication 1: Atrial Fibrillation (ICD-427.31) Indication 2: DCCV Pending (ICD-000.00) Lab Used: [facility] Site: [facility] INR POC 2.0 INR RANGE 2 - 3  Dietary changes: no    Health status changes: no    Bleeding/hemorrhagic complications: no         Comments: 1 episode of dizzy and light headed last week when trying to stand up - took a nap and felt better, no other episodes since  Current Medications (verified): 1)  Coumadin 2.5 Mg Tabs (Warfarin Sodium) .... Take As Directed By Coumadin Clinic. 2)  Lipitor 10 Mg Tabs (Atorvastatin Calcium) .... 1/2 Tab By Mouth Once Daily 3)  Niaspan 500 Mg Cr-Tabs (Niacin (Antihyperlipidemic)) .Marland Kitchen.. 1 Tab By Mouth Two Times A Day 4)  Multivitamins   Tabs (Multiple Vitamin) .Marland Kitchen.. 1 Tab By Mouth Once Daily 5)  Aspirin 81 Mg  Tabs (Aspirin) .Marland Kitchen.. 1 Tab By Mouth Once Daily 6)  Altace 10 Mg Caps (Ramipril) .... Tab By Mouth Once Daily 7)  Vitamin E 400 Unit Caps (Vitamin E) .Marland Kitchen.. 1 Tab By Mouth Once Daily 8)  Calcium 500 Mg Tabs (Calcium Carbonate) .... Tab By Mouth Once Daily 9)  Vitamin D3 400 Unit Tabs (Cholecalciferol) .... Tab By Mouth Once Daily 10)  Furosemide 40 Mg Tabs (Furosemide) .... As Needed 11)  Metoprolol Tartrate 25 Mg Tabs (Metoprolol Tartrate) .Marland Kitchen.. 1 Tab Twice Daily 12)  Vitamin B-12 250 Mcg Tabs (Cyanocobalamin) .Marland Kitchen.. 1 Tab By Mouth Once Daily 13)  Stool Softner .... 2 Tab By Mouth Once Daily 14)  Gabapentin 100 Mg Caps (Gabapentin) .Marland Kitchen.. 1 Tab By Mouth Three Times A Day 15)  Hydrocodone-Acetaminophen 10-325 Mg Tabs (Hydrocodone-Acetaminophen) .... As Needed  Allergies (verified): 1)  ! Zocor  Anticoagulation Management History:      The patient is taking warfarin and comes in today for a routine follow up visit.  Positive risk factors for bleeding  include an age of 75 years or older and presence of serious comorbidities.  The bleeding index is 'intermediate risk'.  Positive CHADS2 values include History of HTN, Age > 36 years old, and History of Diabetes.  The start date was 04/11/2004.  Anticoagulation responsible provider: Shirlee Latch MD, [place].  INR POC: 2.0.  Cuvette Lot#: [id number].  Exp: 04/2011.    Anticoagulation Management Assessment/Plan:      The patient's current anticoagulation dose is Coumadin 2.5 mg tabs: Take as directed by coumadin clinic..  The target INR is 2 - 3.  The next INR is due 07/13/2010.  Anticoagulation instructions were given to patient.  Results were reviewed/authorized by Leota Sauers, PharmD, BCPS, CPP.         Prior Anticoagulation Instructions: INR 1.9  Today we increased your dose.  Your new schedule is 1/2 tablet everyday except on Wednesdays and Fridays when you take 1 whole tablet. Try not to miss any doses during the next 2 weeks.    Current Anticoagulation Instructions: INR 2.0  Coumadin 2.5mg  tabs 1 tab on WED, FRI 1/2 tab on all other days

## 2010-07-09 LAB — CBC
MCV: 97.2 fL (ref 78.0–100.0)
RBC: 4.14 MIL/uL (ref 3.87–5.11)
WBC: 8.6 10*3/uL (ref 4.0–10.5)

## 2010-07-09 LAB — BASIC METABOLIC PANEL
CO2: 26 mEq/L (ref 19–32)
Calcium: 8.4 mg/dL (ref 8.4–10.5)
Chloride: 106 mEq/L (ref 96–112)
Creatinine, Ser: 0.85 mg/dL (ref 0.4–1.2)
GFR calc Af Amer: >60 mL/min (ref >60)
GFR calc Af Amer: >60 mL/min (ref >60)
GFR calc non Af Amer: >60 mL/min (ref >60)
GFR calc non Af Amer: >60 mL/min (ref >60)
Glucose, Bld: 109 mg/dL — ABNORMAL HIGH (ref 70–99)
Potassium: 4 mEq/L (ref 3.5–5.1)
Sodium: 138 mEq/L (ref 135–145)

## 2010-07-09 LAB — LIPID PANEL
Cholesterol: 128 mg/dL (ref 0–200)
HDL: 45 mg/dL (ref >39)
Triglycerides: 55 mg/dL (ref <150)

## 2010-07-09 LAB — GLUCOSE, CAPILLARY
Glucose-Capillary: 108 mg/dL — ABNORMAL HIGH (ref 70–99)
Glucose-Capillary: 116 mg/dL — ABNORMAL HIGH (ref 70–99)
Glucose-Capillary: 128 mg/dL — ABNORMAL HIGH (ref 70–99)

## 2010-07-09 LAB — PROTIME-INR
INR: 2.68 — ABNORMAL HIGH (ref 0.00–1.49)
INR: 3.24 — ABNORMAL HIGH (ref 0.00–1.49)
INR: 3.88 — ABNORMAL HIGH (ref 0.00–1.49)

## 2010-07-09 LAB — DIFFERENTIAL
Lymphocytes Relative: 14 % (ref 12–46)
Lymphs Abs: 1.2 10*3/uL (ref 0.7–4.0)
Monocytes Relative: 8 % (ref 3–12)
Neutro Abs: 6.6 10*3/uL (ref 1.7–7.7)
Neutrophils Relative %: 77 % (ref 43–77)

## 2010-07-12 ENCOUNTER — Telehealth: Payer: Self-pay | Admitting: Cardiovascular Disease

## 2010-07-12 ENCOUNTER — Ambulatory Visit (INDEPENDENT_AMBULATORY_CARE_PROVIDER_SITE_OTHER): Payer: Medicare PPO | Admitting: Family Medicine

## 2010-07-12 ENCOUNTER — Encounter: Payer: Self-pay | Admitting: Family Medicine

## 2010-07-12 DIAGNOSIS — E119 Type 2 diabetes mellitus without complications: Secondary | ICD-10-CM

## 2010-07-12 DIAGNOSIS — I10 Essential (primary) hypertension: Secondary | ICD-10-CM

## 2010-07-12 DIAGNOSIS — I2510 Atherosclerotic heart disease of native coronary artery without angina pectoris: Secondary | ICD-10-CM

## 2010-07-12 DIAGNOSIS — E785 Hyperlipidemia, unspecified: Secondary | ICD-10-CM

## 2010-07-12 DIAGNOSIS — I4891 Unspecified atrial fibrillation: Secondary | ICD-10-CM

## 2010-07-12 NOTE — Telephone Encounter (Signed)
Spoke with pt dtr, pt was seen by primary and she is back in atrial fib. She was taken off the amiodarone about 3 months ago. She is feeling a little tired. appt made for pt to follow up in April. They will call with problems prior to the appt.

## 2010-07-12 NOTE — Progress Notes (Signed)
  Subjective:    Patient ID: [name], female    DOB: 1929/08/11, 77 y.o.   MRN: [id number]  HPI  Patient new to this practice to establish care. He was seeing endocrinologist locally who is retiring.  Past medical history significant for atrial fibrillation, CAD with reported MI in 1998 , hyperlipidemia , type 2 diabetes which has been diet controlled , history of anemia , and GERD. Medications reviewed. Coumadin followed through Coumadin clinic.  CHADs score 2 (age and diabetes,though diet controlled). Patient denies any known drug allergies.    Family history significant for father with alcoholism. Mother and sister with breast cancer. Mother with heart disease and type 2 diabetes.   Social history is that she is widowed. Retired. Past history of brief smoking. No alcohol use.  Prior Pneumovax around 2008. No prior history of screening colonoscopy   Review of Systems  Constitutional: Negative for fever, chills, activity change and fatigue.  HENT: Negative for neck pain.   Respiratory: Negative for cough and shortness of breath.   Cardiovascular: Negative for chest pain and palpitations.  Gastrointestinal: Negative for abdominal pain.  Neurological: Negative for dizziness, syncope, weakness and headaches.  Psychiatric/Behavioral: Negative for confusion.       Objective:   Physical Exam  patient alert and in no distress Oropharynx is moist and clear Neck supple no mass Chest clear. Heart irregularly irregular rhythm with rate  around 70.  Extremities no edema       Assessment & Plan:   #1 history of CAD #2 history of atrial fibrillation with previous cardiac ablation. Chronic Coumadin (CHADs score 2). Clinically appears to be back in atrial fibrillation today, rate controlled. #3 history of dyslipidemia #4 history of type 2 diabetes  #5 history of GERD   Send for old records. Routine followup 4 months. Obtain lipids and blood sugar at that time

## 2010-07-13 ENCOUNTER — Encounter: Payer: Medicare PPO | Admitting: *Deleted

## 2010-07-13 LAB — URINALYSIS, ROUTINE W REFLEX MICROSCOPIC
Glucose, UA: NEGATIVE mg/dL
Hgb urine dipstick: NEGATIVE
Protein, ur: NEGATIVE mg/dL
pH: 6 (ref 5.0–8.0)

## 2010-07-13 LAB — COMPREHENSIVE METABOLIC PANEL
ALT: 60 U/L — ABNORMAL HIGH (ref 0–35)
Albumin: 3.8 g/dL (ref 3.5–5.2)
Calcium: 8.6 mg/dL (ref 8.4–10.5)
Glucose, Bld: 111 mg/dL — ABNORMAL HIGH (ref 70–99)
Potassium: 4 mEq/L (ref 3.5–5.1)
Sodium: 140 mEq/L (ref 135–145)
Total Protein: 6.9 g/dL (ref 6.0–8.3)

## 2010-07-13 LAB — PROTIME-INR
INR: 3.4 — ABNORMAL HIGH (ref 0.00–1.49)
Prothrombin Time: 34.1 seconds — ABNORMAL HIGH (ref 11.6–15.2)

## 2010-07-13 LAB — DIFFERENTIAL
Eosinophils Absolute: 0.1 10*3/uL (ref 0.0–0.7)
Lymphs Abs: 1.2 10*3/uL (ref 0.7–4.0)
Monocytes Absolute: 0.5 10*3/uL (ref 0.1–1.0)
Monocytes Relative: 9 % (ref 3–12)
Neutro Abs: 3.7 10*3/uL (ref 1.7–7.7)
Neutrophils Relative %: 66 % (ref 43–77)

## 2010-07-13 LAB — CBC
Hemoglobin: 13.5 g/dL (ref 12.0–15.0)
MCHC: 34.6 g/dL (ref 30.0–36.0)
Platelets: 208 10*3/uL (ref 150–400)
RDW: 13.8 % (ref 11.5–15.5)

## 2010-07-19 ENCOUNTER — Ambulatory Visit (INDEPENDENT_AMBULATORY_CARE_PROVIDER_SITE_OTHER): Payer: Medicare PPO | Admitting: *Deleted

## 2010-07-19 DIAGNOSIS — I4891 Unspecified atrial fibrillation: Secondary | ICD-10-CM

## 2010-07-19 LAB — POCT INR: INR: 1.9

## 2010-07-19 NOTE — Patient Instructions (Signed)
INR 1.9  Take an extra 0.5 tablet today Then continue current dosage: 1 tablet on Monday and Friday, and 0.5 tablet the rest of the days  Recheck INR on April 13th

## 2010-07-20 ENCOUNTER — Encounter: Payer: Medicare PPO | Admitting: *Deleted

## 2010-07-31 LAB — BASIC METABOLIC PANEL
BUN: 14 mg/dL (ref 6–23)
Creatinine, Ser: 0.72 mg/dL (ref 0.4–1.2)
GFR calc Af Amer: >60 mL/min (ref >60)
GFR calc non Af Amer: >60 mL/min (ref >60)

## 2010-07-31 LAB — CBC
Platelets: 229 10*3/uL (ref 150–400)
WBC: 5.8 10*3/uL (ref 4.0–10.5)

## 2010-07-31 LAB — APTT: aPTT: 36 seconds (ref 24–37)

## 2010-07-31 LAB — PROTIME-INR
INR: 3.5 — ABNORMAL HIGH (ref 0.00–1.49)
Prothrombin Time: 37.9 seconds — ABNORMAL HIGH (ref 11.6–15.2)

## 2010-07-31 LAB — GLUCOSE, CAPILLARY: Glucose-Capillary: 111 mg/dL — ABNORMAL HIGH (ref 70–99)

## 2010-08-03 ENCOUNTER — Encounter: Payer: Self-pay | Admitting: Cardiovascular Disease

## 2010-08-04 ENCOUNTER — Encounter: Payer: Self-pay | Admitting: Cardiovascular Disease

## 2010-08-04 ENCOUNTER — Ambulatory Visit (INDEPENDENT_AMBULATORY_CARE_PROVIDER_SITE_OTHER): Payer: Medicare PPO | Admitting: *Deleted

## 2010-08-04 ENCOUNTER — Ambulatory Visit (INDEPENDENT_AMBULATORY_CARE_PROVIDER_SITE_OTHER): Payer: Medicare PPO | Admitting: Cardiovascular Disease

## 2010-08-04 ENCOUNTER — Encounter: Payer: Medicare PPO | Admitting: *Deleted

## 2010-08-04 DIAGNOSIS — R942 Abnormal results of pulmonary function studies: Secondary | ICD-10-CM

## 2010-08-04 DIAGNOSIS — I4891 Unspecified atrial fibrillation: Secondary | ICD-10-CM

## 2010-08-04 DIAGNOSIS — I10 Essential (primary) hypertension: Secondary | ICD-10-CM

## 2010-08-04 DIAGNOSIS — I2510 Atherosclerotic heart disease of native coronary artery without angina pectoris: Secondary | ICD-10-CM

## 2010-08-04 DIAGNOSIS — I428 Other cardiomyopathies: Secondary | ICD-10-CM

## 2010-08-04 DIAGNOSIS — E7800 Pure hypercholesterolemia, unspecified: Secondary | ICD-10-CM

## 2010-08-04 DIAGNOSIS — I509 Heart failure, unspecified: Secondary | ICD-10-CM

## 2010-08-04 LAB — POCT INR: INR: 2.6

## 2010-08-04 NOTE — Progress Notes (Signed)
Abrea is seen today for PAF, [facility] x2 most recently 6/10 maint on lowdose amiodarone.  Amiodarone stopped due to decrease in DLCO  She has HTN, carotid bruit CAD with stent to LAD in 2006.  She has been compliant with her meds.  She has followed in the coumadin clinic with Rx INR and no bleeding problems.  She has been having palpitations and dyspnea.  Myovue done 1/12 showed no ischemia but EF ?43% .  She  had a duplex showing no significant ICA stenosis for  Right bruit   Reviewed records from [facility] she had palpitations and weakness but was in NSR.  She is obviously having more frequent PAF off amiodarone and is in afib today.  With known CAD and decreased LV she would need to start Tikosyn if we wanted to pursue an aggressive strategy of [facility] as opposed to rate control and anticoagulation.  Will reassess PFT;s to see if DLCO is improved and assess EF more accurately by echo.  Then decide if hospitalization for Tikosyn load and [facility] is in order.  Daughter and patient understand and agree with plan.  F/U coumadin clinic today.    She has a trip to [place] in May that we will have to work around  ROS: Denies fever, malais, weight loss, blurry vision, decreased visual acuity, cough, sputum, SOB, hemoptysis, pleuritic pain, palpitaitons, heartburn, abdominal pain, melena, lower extremity edema, claudication, or rash.   General: Affect appropriate Healthy:  appears stated age HEENT: normal Neck supple with no adenopathy JVP normal no bruits no thyromegaly Lungs clear with no wheezing and good diaphragmatic motion Heart:  S1/S2 no murmur,rub, gallop or click PMI normal Abdomen: benighn, BS positve, no tenderness, no AAA no bruit.  No HSM or HJR Distal pulses intact with no bruits No edema Neuro non-focal Skin warm and dry No muscular weakness   Current Outpatient Prescriptions  Medication Sig Dispense Refill  . aspirin 81 MG EC tablet Take 81 mg by mouth daily.        Marland Kitchen atorvastatin (LIPITOR)  10 MG tablet Take 10 mg by mouth daily.        . calcium carbonate (OS-CAL) 600 MG TABS Take 600 mg by mouth daily.        . furosemide (LASIX) 40 MG tablet Take 40 mg by mouth daily. As needed       . metoprolol tartrate (LOPRESSOR) 25 MG tablet Take 25 mg by mouth 2 (two) times daily.        . niacin (NIASPAN) 500 MG CR tablet Take 500 mg by mouth 2 (two) times daily.        . potassium chloride (MICRO-K) 10 MEQ CR capsule Take 10 mEq by mouth daily. As needed       . ramipril (ALTACE) 10 MG capsule Take 10 mg by mouth daily.        Marland Kitchen warfarin (COUMADIN) 2.5 MG tablet Take by mouth as directed.          Allergies  Simvastatin  Electrocardiogram:  Afib 90 LVH lateral T wave changes  Assessment and Plan

## 2010-08-04 NOTE — Assessment & Plan Note (Signed)
No chest pain myovue 1/12 nonishemic

## 2010-08-04 NOTE — Patient Instructions (Addendum)
Your physician has recommended that you have a pulmonary function test. Pulmonary Function Tests are a group of tests that measure how well air moves in and out of your lungs with DLCO.   Your physician has requested that you have an echocardiogram. Echocardiography is a painless test that uses sound waves to create images of your heart. It provides your doctor with information about the size and shape of your heart and how well your heart's chambers and valves are working. This procedure takes approximately one hour. There are no restrictions for this procedure.   Your physician recommends that you schedule a follow-up appointment in: 3 months

## 2010-08-04 NOTE — Assessment & Plan Note (Signed)
EF 43% by nuclear.  F/U echo to reassess more accurately.  Maint of NSR more important to prevent exertional dyspnea

## 2010-08-04 NOTE — Assessment & Plan Note (Signed)
Back in afib.  Check EF and DLCO.  Likely arrange hospitalization for [facility] and [facility] in next few weeks

## 2010-08-04 NOTE — Assessment & Plan Note (Signed)
Well controlled.  Continue current medications and low sodium Dash type diet.    

## 2010-08-04 NOTE — Assessment & Plan Note (Signed)
Cholesterol is at goal.  Continue current dose of statin and diet Rx.  No myalgias or side effects.  F/U  LFT's in 6 months. Lab Results  Component Value Date   LDLCALC  Value: 72        Total Cholesterol/HDL:CHD Risk Coronary Heart Disease Risk Table                     Men   Women  1/2 Average Risk   3.4   3.3  Average Risk       5.0   4.4  2 X Average Risk   9.6   7.1  3 X Average Risk  23.4   11.0        Use the calculated Patient Ratio above and the CHD Risk Table to determine the patient's CHD Risk.        ATP III CLASSIFICATION (LDL):  <100     mg/dL   Optimal  562-130  mg/dL   Near or Above                    Optimal  130-159  mg/dL   Borderline  865-784  mg/dL   High  >696     mg/dL   Very High 2/95/2841

## 2010-08-16 ENCOUNTER — Ambulatory Visit (INDEPENDENT_AMBULATORY_CARE_PROVIDER_SITE_OTHER): Payer: Medicare PPO | Admitting: Internal Medicine

## 2010-08-16 ENCOUNTER — Ambulatory Visit (HOSPITAL_COMMUNITY): Payer: Medicare PPO | Attending: Cardiovascular Disease | Admitting: Radiology

## 2010-08-16 DIAGNOSIS — I4891 Unspecified atrial fibrillation: Secondary | ICD-10-CM

## 2010-08-16 DIAGNOSIS — I10 Essential (primary) hypertension: Secondary | ICD-10-CM | POA: Insufficient documentation

## 2010-08-16 DIAGNOSIS — R942 Abnormal results of pulmonary function studies: Secondary | ICD-10-CM

## 2010-08-16 DIAGNOSIS — I509 Heart failure, unspecified: Secondary | ICD-10-CM | POA: Insufficient documentation

## 2010-08-16 DIAGNOSIS — J449 Chronic obstructive pulmonary disease, unspecified: Secondary | ICD-10-CM | POA: Insufficient documentation

## 2010-08-16 DIAGNOSIS — J4489 Other specified chronic obstructive pulmonary disease: Secondary | ICD-10-CM | POA: Insufficient documentation

## 2010-08-16 NOTE — Progress Notes (Signed)
PFT done today. 

## 2010-08-28 ENCOUNTER — Ambulatory Visit (INDEPENDENT_AMBULATORY_CARE_PROVIDER_SITE_OTHER): Payer: Medicare PPO | Admitting: *Deleted

## 2010-08-28 DIAGNOSIS — I4891 Unspecified atrial fibrillation: Secondary | ICD-10-CM

## 2010-09-05 NOTE — Op Note (Signed)
[name], [name]                 ACCOUNT NO.:  [id number]   MEDICAL RECORD NO.:  [id number]          PATIENT TYPE:  INP   LOCATION:  6714                         FACILITY:  [facility]   PHYSICIAN:  Doralee Albino. Carola Frost, M.D. DATE OF BIRTH:  1929/06/06   DATE OF PROCEDURE:  01/02/2007  DATE OF DISCHARGE:                               OPERATIVE REPORT   PREOPERATIVE DIAGNOSES:  1. Left distal femur fracture with intercondylar extension.  2. Retained left hip screw/femoral nail.  3. Distal pole patellar fracture.   POSTOPERATIVE DIAGNOSES:  1. Left distal femur fracture with intercondylar extension.  2. Retained left hip screw/femoral nail.  3. Patellar fracture.   PROCEDURES:  1. Open reduction and internal fixation of left distal condylar of      femur fracture with intercondylar extension.  2. Removal of deep implant, left hip and femur.  3. Open reduction and internal fixation of patella.   SURGEON:  Myrene Galas, MD   ASSISTANT:  Patrick Jupiter, RNFA   ANESTHESIA:  General.   ANESTHESIOLOGIST:  Arta Bruce, MD   COMPLICATIONS:  None.   TOTAL TOURNIQUET TIME:  Approximately 85 minutes.   ESTIMATED BLOOD LOSS:  250 mL.   DISPOSITION:  To PACU.   CONDITION:  Stable.   BRIEF SUMMARY OF INDICATION FOR PROCEDURE:  [name] is a 77 year old  community ambulator with a cane who sustained a severe left distal femur  fracture distal to a femoral nail.  CT scan demonstrated significant  intra-articular displacement.  We discussed preoperatively the risks and  benefits of surgery including the possibility of conversion to a distal  femoral replacement.  I did discuss the case with 2 total joint  surgeons, both of whom leaned toward internal fixation and repair for  fracture.  She understood the additional risks to include infection,  nerve injury, vessel injury, malunion, nonunion and need for further  surgery including subsequent total knee.  After full discussion, she and  her daughter wished to proceed.   BRIEF DESCRIPTION OF PROCEDURE:  [name] was taken to the operating  room after administration of preop antibiotics.  As she was positioned  from her bed to the operative table, we identified a decubitus ulcer  which was about 6 x 4 cm with reddened skin and some superficial loss.  The patient was positioned carefully to pad prominences in the lazy  lateral position with a roll under the hip and back.  The left lower  extremity was then prepped and draped the usual sterile fashion,  carefully controlling the fracture site.   We began with removal of the nail, identifying them and removing the  distal locks, the lag screw in the femoral head and then finally the  nail.  We then irrigated out thoroughly and closed the 2 most proximal  incisions.  We then placed a tourniquet and turned our attention to the  distal femur.  The leg was exsanguinated with an Esmarch bandage and  tourniquet inflated to 300 mmHg.   We made a standard anterior or medial parapatellar approach and examined  the articular  surface.  There was not as much comminution and  osteoporosis as anticipated.  The trochlea consisted of one  anterolateral piece as well as a lateral so-called Hoffa fragment  involving the posterior aspect of the femoral condyle.  Using joysticks  in the distal articular surface, we were able to reduce and pin  provisionally the fracture site up followed by lag screw fixation,  countersinking the anterior-to-posterior screw well below the articular  surface.  We then reduced the articular block with traction and  manipulation, followed by application of an 8-hole lateral distal  femoral locking plate from [employer].  We did not do any medial stripping  of any kind.  We established the appropriate trajectory of the center  screw and then lagged this into the far condyle.  We placed additional  pins to secure the articular block and then reduced this to the  shaft  more proximally with standard fixation.  Prior to doing so, we did lag  in the medial distal femur using the King tong for provisional reduction  initially.  We checked all screws for appropriate trajectory and length  and ended up with excellent restoration of the articular surface, which  was stable at 90 degrees of flexion as well as to varus/valgus forces  and came into full extension, again in appropriate alignment and  rotation.  We placed multiple proximal locking screws, as well.  We did  deflate the tourniquet after of reduction of the articular block to the  shaft.   With regard to the patella, internal fixation was performed with a 5-0  FiberWire that was taken around the distal pole and proximal pole of the  patella through the tendon and tying it securely with excellent  compression in full extension.  The knee was then able to be taken  through a range of motion above 90 degrees with again no migration  whatsoever at the fracture site.  We then irrigated thoroughly, and  closed with a #1 figure-of-eight Vicryl for the parapatellar incision, 0  for the deep subcu, 2-0 for the shallow subcu and staples for the skin.  A similar layered closure was performed on the lateral side.  A sterile,  gently compressive dressing was applied.  The patient was then awakened  from anesthesia and transported to the PACU in stable condition.   PROGNOSIS:  [name] will be restarted on her Coumadin.  She will be  non-weightbearing on the left lower extremity with unrestricted range of  motion.  She will need to be turned every 2 hours per protocol and of  course the ostomy nurses will be involved with care of her decubitus.  She remains at increased risk for perioperative complications, given her  comorbidities.      Doralee Albino. Carola Frost, M.D.  Electronically Signed     MHH/MEDQ  D:  01/02/2007  T:  01/03/2007  Job:  [id number]

## 2010-09-05 NOTE — Consult Note (Signed)
[name], [name]                 ACCOUNT NO.:  [id number]   MEDICAL RECORD NO.:  [id number]          PATIENT TYPE:  INP   LOCATION:  5007                         FACILITY:  [facility]   PHYSICIAN:  Madolyn Frieze. Jens Som, MD, FACCDATE OF BIRTH:  21-Nov-1929   DATE OF CONSULTATION:  12/29/2006  DATE OF DISCHARGE:                                 CONSULTATION   REFERRING PHYSICIAN:  Burnard Bunting, M.D.   REASON FOR CONSULTATION:  [name] is a 77 year old female with a past  medical history of coronary artery disease, paroxysmal atrial  fibrillation, hypertension, hyperlipidemia, and diabetes mellitus, who  we are asked to evaluate preoperatively prior to knee surgery.  The  patient's cardiac history dates back to 1998 when she had PCI of her LAD  following an anterior MI.  Her most recent Myoview was performed in  April 2007 and it showed an ejection fraction of 50% with apical scar  and no ischemia.  She had an echocardiogram in March that showed an  ejection fraction of 40% and mild mitral regurgitation.  Note, she  typically does not have dyspnea on exertion, orthopnea, PND, pedal  edema, palpitations, presyncope, syncope or exertional chest pain.  She  does occasionally feel a sharp pain in her chest for 1 second that is  not clearly related to exertion.  She has had this the years and it is  unchanged.  Last evening she fell at home, tripping over a rub.  Note,  there was no syncope.  She did fracture her knee on the left and has  been admitted.  We were asked to evaluate preoperatively.  Note, the  patient also has a history of paroxysmal atrial fibrillation and has had  previous cardioversions.  She is now on amiodarone and Coumadin.   MEDICATIONS:  1. Amiodarone 200 mg p.o. daily.  2. Colace 100 mg p.o. daily.  3. Niacin 500 mg p.o. b.i.d.  4. Altace 10 mg p.o. daily.  5. Coumadin as directed.  6. Aspirin 81 mg daily p.o. daily.  7. Lipitor 10 mg p.o. daily.  8. Potassium 10 mEq  p.o. daily.  9. Lasix 40 mg daily.   ALLERGIES:  She has an allergy to [facility].   SOCIAL HISTORY:  She has a remote history of tobacco use, but has not  smoked in 15 years.  She does not consume alcohol.   FAMILY HISTORY:  Positive for coronary artery disease in her sibling.   PAST MEDICAL HISTORY:  1. Hypertension.  2. Hyperlipidemia.  3. Diabetes mellitus.  4. She has a history of coronary disease and paroxysmal atrial      fibrillation as outlined in the HPI.  5. She has had a previous hip fracture requiring repair.   REVIEW OF SYSTEMS:  She denies any headaches or fevers, or chills.  There is no productive cough or hemoptysis.  No dysphagia, odynophagia,  melena or hematochezia.  There is no dysuria or hematuria.  There is no  rash or seizure activity.  There is no orthopnea, PND or pedal edema.  There is no claudication  noted.  The remaining systems are negative.   PHYSICAL EXAM:  VITAL SIGNS:  Exam today shows a blood pressure of 99/58  and her pulse is 82.  She is afebrile.  GENERAL:  She is well-developed and well-nourished, in no acute  distress.  Skin is warm and dry.  She does not appear to be depressed  and there is no peripheral clubbing.  BACK:  Normal.  HEENT:  Normal with normal eyelids.  NECK:  Supple with normal upstrokes bilaterally and there are bilateral  soft carotid bruits.  There is no jugular distention and no thyromegaly  is noted.  CHEST:  Clear to auscultation with normal expansion.  CARDIOVASCULAR:  Exam reveals a regular rate and rhythm with normal S1  and S2.  There is a 1/6 to 2/6 systolic ejection murmur at the left  sternal border.  There is no S3 or S4.  ABDOMEN:  Not tender or distended.  Positive bowel sounds.  No  hepatosplenomegaly and no mass appreciated.  There is no abdominal  bruit.  EXTREMITIES:  She is 2+ femoral pulses bilaterally and no bruits.  EXTREMITIES:  Show no edema.  Her left lower extremity is wrapped and is  status post  knee fracture.  Her distal pulse is 2+ on the right.  NEUROLOGIC:  Exam is grossly intact.   LABORATORY AND ACCESSORY CLINICAL DATA:  Her electrocardiogram shows a  sinus rhythm with nonspecific ST changes and left ventricle hypertrophy.  Her PT on admission was 2.  Her hemoglobin and hematocrit were 12.3 and  36.2, respectively.  Her creatinine was 0.8.   DIAGNOSES:  1. Preoperative evaluation prior to repair of left knee fracture --      the patient has had no recent cardiac symptoms including no      exertional chest pain, no shortness of breath and no syncope.  She      had a Myoview in April 2007 that showed an ejection fraction of      50%, apical myocardial infarction and no ischemia.  Given that her      symptoms are unchanged and she had a low-risk study approximately      16 months ago, no further cardiac workup would be indicated prior      to proceeding with her surgery.  I would recommend continuing with      her preadmission medicines after her surgery and resuming her      Coumadin at that time as well.  2. Coronary artery disease -- she will continue on her aspirin      postoperatively, once Surgery feels comfortable.  She also would      continue on her statin and ACE inhibitor.  3. History of paroxysmal atrial fibrillation -- she will continue on      her amiodarone and we will resume Coumadin following her surgery.  4. Diabetes mellitus -- diet control.  5. Hyperlipidemia -- she will continue on her statin.  6. Patellar fracture -- per Orthopedics.   We will follow her while she is in the hospital.      Madolyn Frieze. Jens Som, MD, [facility]  Electronically Signed     BSC/MEDQ  D:  12/29/2006  T:  12/30/2006  Job:  [id number]

## 2010-09-05 NOTE — Assessment & Plan Note (Signed)
[facility]                            CARDIOLOGY OFFICE NOTE   AUSHA, SIEH                        MRN:          [id number]  DATE:06/29/2008                            DOB:          Aug 16, 1929    Ms. Eleaner Dibartolo returns today for follow up.  She has a history coronary  disease with ischemic cardiomyopathy.  Her EF, I believe is in the 35%  range.  She recently fell and broke her right wrist.  This was operated  on by Dr. Carola Frost.  She did well without any sequelae.  She has had PAF  and is on chronic Coumadin.  She has maintained sinus rhythm for a  while.  We will stop all of her amiodarone at this time.   Her cholesterol and blood pressure have been well controlled.   Her rehab is going well, but she still has a weak hand grip.   She has a remote history of an inferior wall MI and previous angioplasty  of the LAD.  Her EF has been in the 42% range by nuclear and little bit  lower by echo.  At some point, we may need to reassess this.  Her last  Myoview study was on April, 2, 2007, and basically showed a distal  anterior septal and apical infarct with no ischemia.  EF on that scan  was 50%.   REVIEW OF SYSTEMS:  Remarkable for increasing strength in the right  hand.  Her previous left leg fracture has healed well.  She walks with a  cane.  She is not having chest pain, PND, or orthopnea.  There have been  no palpitations or bleeding diathesis.  She needs to get her INR checked  today.   ALLERGIES:  She is allergic to [facility].   MEDICATIONS:  1. She is on Niaspan 500 b.i.d.  2. Coumadin as directed.  3. Lasix 40 a day.  4. Lipitor 10 a day.  5. Vitamin E.  6. Aspirin.  7. Calcium.  8. Altace 10 mg a day.   PHYSICAL EXAMINATION:  GENERAL:  Remarkable for an elderly female  walking with a limp.  VITAL SIGNS:  Blood pressure is 140/72, pulse 62 and regular, and weight  184.  HEENT:  Unremarkable.  Carotids normal without bruit.  No  lymphadenopathy, thyromegaly, or JVP elevation.  LUNGS:  Clear, good diaphragmatic motion.  No wheezing.  HEART:  S1 and S2 normal heart sounds.  PMI normal.  ABDOMEN:  Benign.  Bowel sounds positive.  No AAA.  No tenderness.  No  bruit.  No hepatosplenomegaly.  No hepatojugular reflux.  EXTREMITIES:  No clubbing, cyanosis, or edema.  NEUROLOGIC:  Nonfocal.  SKIN:  Warm and dry.  No muscular weakness outside of the right hand,  which is weak due to the recent fracture.   IMPRESSION:  1. Paroxysmal atrial fibrillation.  Stop amiodarone.  [facility] for [facility]agulation.  Cautioned her that her INR      may drift, lower off amiodarone.  2. History of coronary  artery disease.  No angina.  Consider follow up      stress test in a year.  Continue aspirin therapy.  No need for beta-      blocker due to resting heart rate being so low.  3. Hypertension, currently well controlled.  Continue current ACE      inhibitor dose and low-sodium diet.  4. Hypercholesterolemia.  Continue Niaspan and Lipitor, lipid and      liver profile in 6 months.  5. Orthopedic issues.  Continue rehab.  Follow up with Dr. Carola Frost.      Overall, I think Adair is doing well and I will see her back in 6      months.     [name]. Eden Emms, MD, [facility]  Electronically Signed    PCN/MedQ  DD: 06/29/2008  DT: 06/29/2008  Job #: [id number]

## 2010-09-05 NOTE — H&P (Signed)
NAMETOMESHIA, PIZZI NO.:  [id number]   MEDICAL RECORD NO.:  [id number]          PATIENT TYPE:  INP   LOCATION:  5007                         FACILITY:  [facility]   PHYSICIAN:  Burnard Bunting, M.D.    DATE OF BIRTH:  [birth year]   DATE OF ADMISSION:  12/29/2006  DATE OF DISCHARGE:                              HISTORY & PHYSICAL   CHIEF COMPLAINT:  Left knee pain.   HISTORY OF PRESENT ILLNESS:  Dictation ended at this point.      Burnard Bunting, M.D.  Electronically Signed     GSD/MEDQ  D:  12/29/2006  T:  12/29/2006  Job:  [id number]

## 2010-09-05 NOTE — H&P (Signed)
NAMEARIENNE, GARTIN NO.:  [id number]   MEDICAL RECORD NO.:  [id number]          PATIENT TYPE:  INP   LOCATION:  5007                         FACILITY:  [facility]   PHYSICIAN:  Burnard Bunting, M.D.    DATE OF BIRTH:  02-Feb-1930   DATE OF ADMISSION:  12/28/2006  DATE OF DISCHARGE:                              HISTORY & PHYSICAL   CHIEF COMPLAINT:  Left knee pain.   HISTORY OF PRESENT ILLNESS:  [name] is a 77 year old female with  left knee pain.  She tripped and fell on her left knee yesterday while  she was in the bathroom.  She has a history of a left hip fracture  treated with IMHS in the past.  She denies any other orthopedic  complaints.   PAST MEDICAL HISTORY:  Known for:  1. AFib.  2. Diabetes.  3. Increased cholesterol.  4. CAD with an ejection fraction of 40%.   PAST SURGICAL HISTORY:  Known for left hip fracture, open reduction  internal fixation about 2 years ago.   CURRENT MEDICATIONS:  Amiodarone, Altace, aspirin, Coumadin, Colace,  Lipitor.   She denies any smoking or alcohol use.  She lives with her husband.   A full 10-point system reviewed and noncontributory.  Not much in the  way of recent chest pain or shortness of breath.   EXAMINATION:  GENERAL:  She is in mild distress.  VITAL SIGNS:  Stable.  Blood pressure is 136/88, pulse 62, respirations  18, O2 sats 100%.  CHEST:  Clear to auscultation.  HEART:  Regular rate and rhythm.  ABDOMINAL EXAM:  Benign.  EXTREMITIES:  Left lower extremity is splinted.  DP pulse 2+/4.  Toe  flexion and extension is intact.  No paresthesias in the plantar dorsal  aspect of the foot.  Compartments are soft, die and cast.   Chest x-ray no acute airspace disease.  EKG regular rate and rhythm.  Radiographs of the knee including CT scan show complex distal femur  fracture around a previous rod.  Her white count is 7.1, hemoglobin  12.3, platelets 255,000.  Sodium and potassium 138 and 3.7, BUN 18,  creatinine 0.8.  INR is 2, PTT is 41.  Urinalysis shows urine nitrate  positive, leukocyte esterase negative, she does have some bacteria.   IMPRESSION:  Complex left knee fracture.   PLAN:  Admit with open reduction internal fixation after cardiac risk  stratification, INR less than 1.4.      Burnard Bunting, M.D.  Electronically Signed     GSD/MEDQ  D:  12/29/2006  T:  12/29/2006  Job:  [id number]

## 2010-09-05 NOTE — Consult Note (Signed)
[name], BOVE                 ACCOUNT NO.:  [id number]   MEDICAL RECORD NO.:  [id number]          PATIENT TYPE:  INP   LOCATION:  5007                         FACILITY:  [facility]   PHYSICIAN:  Doralee Albino. Carola Frost, M.D. DATE OF BIRTH:  08-13-1929   DATE OF CONSULTATION:  12/30/2006  DATE OF DISCHARGE:                                 CONSULTATION   REASON FOR CONSULTATION:  Request a complex left distal femur fracture  against orthopedic trauma service consultation.   BRIEF HISTORY OF PRESENTATION:  [name] is a 77 year old female  who had a ground level fall today resulting in immediate onset pain  deformity to the left lower extremity.  She was initially seen and  evaluated by Dr. August Saucer and placed into a long-leg splint and then given  the complexity of the fracture which was severely comminuted distal  femur with intercondylar extension as well as a retained hip screw, he  requested my evaluation and possible assumption of management.  At the  present time, the patient denies any numbness or tingling her lower  extremities.  She is relatively comfortable in her splint.  She denies  upper extremity injury as well as any antecedent shortness of breath or  syncopal-type event.  She simply tripped over a rug.   ALLERGIES:  ZOCOR.   MEDICATIONS:  1. Amiodarone.  2. Colace.  3. Niacin.  4. Altace.  5. Coumadin.  6. Aspirin.  7. Lipitor.  8. Potassium.  9. Lasix.   PAST MEDICAL HISTORY:  Notable for of hypertension, hyperlipidemia,  noninsulin dependent diabetes, CAD status post an MI with an EF of 40%  and some associated mitral regurgitation.  For prior fractures including  ankle, sternum, wrist, and left subtrochanteric region, the  subtrochanteric fracture was treated with intramedullary nailing 2 years  ago.   REVIEW OF SYSTEMS:  Reviewed and is included in the chart.   SOCIAL HISTORY:  Remote tobacco use at a pack a day but none recent and  none since her MI.   PHYSICAL EXAMINATION:  [name] is not in any acute distress.  She  appears appropriate for stated age.  The upper extremities, shoulder,  elbows, wrists, and hands are notable for the absence of focal  tenderness, ecchymosis, crepitus, blocked motion, or diminished  strength.  No significant upper extremity edema or lymphadenopathy of  note.  Pelvis is stable and nontender.  The left proximal hip is  nontender.  She does have again a long-leg splint, and I did not remove  it to evaluate her knee.  Distally, she has no ankle tenderness and is  able to flex and extend her great and lesser toes without difficulty.  Intact deep peroneal, superficial peroneal, and tibial nerve sensory and  motor function.  Dorsalis pedis pulses 2+.  No significant pedal edema.  There are contralateral lower extremity notable for the absence of focal  ecchymosis, crepitus, tenderness about the hip, knee, and ankle.  No  edema and intact pulses.   X-RAYS:  X-rays were reviewed.  They demonstrate a comminuted distal  femur fracture  with intercondylar extension.  There is comminution and  displacement of the trochlea as well as posterior shear-type fractures  of the femoral condyles.  The exit side of those condyle fractures are  slightly more anterior than the traditional Hoffa fragment.  The  fracture does extend up to the distal locking bolt.  There is a well-  healed subtroch fracture more proximally.  No loosening of the hardware  is noted.  No tib-fib fracture is visualized on the tib-fib films.   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   PLAN:  I have discussed at length with Ms. Cabanilla the options for  addressing her distal femur injury and these include specifically ORIF  and total knee arthroplasty with a distal femoral replacement.  I think  the femoral rod needs to be  removed regardless given her likely  osteoporosis and the complete canal fill with the femoral nail.  I have  discussed with them the need for restricted weightbearing over the next  8 weeks with repair of her fracture verses immediate weightbearing with  replacement.  The family and I are concerned about the possible effects  of inability to mobilize may have on her, but clearly, there is  associated risk with a distal femoral replacement as well, and we have  discussed infection, nerve injury, vessel injury, heart attack, stroke,  need for further surgery, DVT, PE, and others.  I have also reviewed the  case with both Dr. Charlann Boxer and Dr. Lequita Halt and have factored in their  recommendations as well.  At this time, the plan is as follows which is  to remove the nail and then perform a medial approach to evaluate both  the quality of her bone and the ability to reconstruct her distal  femoral articular surface.  After weighing these factors, we will  proceed either with internal fixation or distal femoral replacement.  Again, the patient, her daughter, and daughter-in-law understand and  wish to proceed.      Doralee Albino. Carola Frost, M.D.  Electronically Signed     MHH/MEDQ  D:  12/31/2006  T:  12/31/2006  Job:  [id number]

## 2010-09-05 NOTE — Assessment & Plan Note (Signed)
[facility]                            CARDIOLOGY OFFICE NOTE   [name], [name]                        MRN:          [id number]  DATE:03/05/2008                            DOB:          04/28/1929    [name] returns today for followup.  Unfortunately, she fell and broke her  right arm.  She was seen at Urgent Care.  She broke her left leg last  year.   She has a history of paroxysmal atrial fibrillation with hypertension  and lower extremity edema.   She is on chronic Coumadin.   There is remote history of inferior wall MI with an EF of 42%.  She has  had previous angioplasty of the LAD.   She has a carotid bruit, but no significant carotid disease by duplex in  June.  Outside of her arm hurting, she has not had any significant chest  pain, palpitations, PND.  She has lower extremity edema and probably  needs to be on a diuretic.   Medication include:  1. Niaspan 500 b.i.d.  2. Coumadin as directed.  3. Lasix 10 every other day.  4. Lipitor 5 mg a day.  5. Vitamin E.  6. Aspirin.  7. Calcium.  8. Multivitamins.  9. Amiodarone 200 b.i.d.  10.Altace 10 a day.   Review of systems is remarkable for significant pain in her arm.  She  was asking for some Percocet which I think is reasonable since the  Urgent Care only give her Tylenol.   PHYSICAL EXAMINATION:  GENERAL:  Remarkable for increased weight of 182  compared to 173 in May.  Blood pressure 150/70, pulse 68 regular,  respiratory rate 14, afebrile.  HEENT:  Unremarkable.  NECK:  She does have a faint right carotid bruit.  No lymphadenopathy,  thyromegaly, or JVP elevation.  LUNGS:  Clear.  Good diaphragmatic motion.  No wheezing.  S1 and S2 with  normal heart sounds.  PMI normal.  ABDOMEN:  Benign.  Bowel sounds positive.  No AAA.  No bruit.  No  hepatosplenomegaly or hepatojugular reflux.  No tenderness.  EXTREMITIES:  Distal pulses are intact with +1 edema bilaterally.  NEUROLOGIC:  Nonfocal.  SKIN:  Warm and dry.  Her right arm is in a cast.   IMPRESSION:  1. History of paroxysmal atrial fibrillation.  Continue low-dose      amiodarone.  See Coumadin Clinic today.  2. Lower extremity edema.  Add Lasix 10 mg every other day.  Followup      BMET in 8 weeks.  3. Hypertension, currently well controlled.  Continue Altace.  4. Hyperlipidemia.  Continue Lipitor.  Lipid and liver profile in 6      weeks.  5. Diabetes.  Follow with Dr. Dagoberto Cruz.  Her Actos has been stopped due      to her edema.  6. Recent fracture of the right forearm.  Follow up with Urgent Care.      I told her I would be happy to set her up to see one of my      orthopedic friends  if it is not healing appropriately.      [name]. Joanne Emms, MD, [facility]  Electronically Signed    PCN/MedQ  DD: 03/05/2008  DT: 03/05/2008  Job #: [id number]

## 2010-09-05 NOTE — Assessment & Plan Note (Signed)
[facility]                            CARDIOLOGY OFFICE NOTE   Joanne, Cruz                        MRN:          [id number]  DATE:08/22/2007                            DOB:          25-Oct-1929    [name] is seen today in followup.  She had a femoral head fracture  last year.  She has an old inferior wall infarction with history of  anterior wall infarction, EF of 42%.  She has had previous angioplasty  of the LAD.  The patient was seen as part of the determine trial,  however, she has not had followup.  She had a dense anterior apical wall  scar with an EF of 42%.  I think she can be put in the registry.  Since  I last saw her she dropped a pan on her foot.  She had significant  ecchymoses.  She went and saw an orthopedic doctor for it and is in a  soft shoe.  Otherwise she is doing well.  She is not having any  significant chest pain, PND, orthopnea, no palpitations.  She wants to  come off her Niaspan due to expense.  She just had have lipids checked  with Dr. Dagoberto Cruz and I will review them to see if she can come off her  Niaspan.  We will cut back on her amiodarone to 200 once a day since she  has not had any atrial fibrillation in a while.   REVIEW OF SYSTEMS:  Otherwise negative.   MEDICATIONS:  1. Meds include Niaspan 500 b.i.d. possibly to be stopped.  2. Coumadin as directed.  3. Lasix 40 a day.  4. Lipitor 10 a day  5. Aspirin a day.  6. B12.  7. Calcium.  8. Multivitamins.  9. Amiodarone to be decreased to 200 once a day.  10.Altace 10 a day.  11.Vitamin D.  12.Fosamax 75 mg a day.   PHYSICAL EXAMINATION:  GENERAL:  Her exam is remarkable for a healthy-  appearing, elderly white female in no distress.  VITAL SIGNS:  She is in sinus rhythm at a rate of 62, blood pressure is  130/70, respiratory rate 14, afebrile.  Her weight is 173.  HEENT:  Unremarkable.  NECK:  Right carotid bruit.  No lymphadenopathy, thyromegaly or  JVP  elevation.  LUNGS:  Clear with good diaphragmatic motion.  No wheezing.  CARDIOVASCULAR:  S1-S2 normal heart sounds.  PMI normal.  ABDOMEN:  Benign.  Bowel sounds positive.  No AAA, no tenderness, no  bruit.  No hepatosplenomegaly.  No hepatojugular reflux.  Distal pulses  are intact with no edema.  NEURO:  Nonfocal.  SKIN:  Warm and dry.  She has a large ecchymosis over the toes in the  left foot.   IMPRESSION:  1. Coronary artery disease with previous anterior wall myocardial      infarction.  Continue aspirin therapy.  Not on beta-blocker due to      her relative bradycardia.  Followup Myoview in a year.  2. Previous anterior wall myocardial infarction, question enroll in  the registry aspect of the determine trial.  3. A history of atrial fibrillation.  Titrate amiodarone back to 200      once a day.  Continue Coumadin.  4. Lower extremity edema improved.  Continue low dose Lasix.  5. Hyperlipidemia.  Get results of recent lipids from Dr. Dagoberto Cruz.      Continue Lipitor and possibly stop Niaspan.  6. History of right carotid bruit.  I will have to look back through      my records to see when her last duplex was.  I believe the last one      I saw in the chart was July of 2007 where she had zero to 39%      bilateral stenosis.  I suspect she will not need one for another      year or two.   Her EKG today showed sinus rhythm with poor R wave progression, left  axis deviation.     [name]. Eden Emms, MD, [facility]  Electronically Signed    PCN/MedQ  DD: 08/22/2007  DT: 08/22/2007  Job #: [id number]

## 2010-09-05 NOTE — Op Note (Signed)
[name], BALLANTINE                 ACCOUNT NO.:  [id number]   MEDICAL RECORD NO.:  [id number]          PATIENT TYPE:  AMB   LOCATION:  CATH                         FACILITY:  [facility]   PHYSICIAN:  [name]. Eden Emms, MD, FACCDATE OF BIRTH:  [birth year]   DATE OF PROCEDURE:  10/18/2008  DATE OF DISCHARGE:                               OPERATIVE REPORT   PROCEDURE:  Cardioversion.   INDICATIONS:  A 77 year old patient with coronary disease and an EF of  45%.  Previous cardioversion for atrial fibrillation, the patient had a  recurrence.   INR was therapeutic at 3.5.   ANESTHESIA ADMINISTERED:  125 mg of Diprivan.   PROCEDURE IN DETAIL:  The patient received a single 200 joules biphasic  shock.  She converted to atrial fibrillation at a rate of 93 and to  sinus rhythm at a rate of 62.   IMPRESSION:  Successful cardioversion with no immediate complications.      [name]. Eden Emms, MD, [facility]  Electronically Signed     PCN/MEDQ  D:  10/18/2008  T:  10/19/2008  Job:  [id number]

## 2010-09-05 NOTE — Discharge Summary (Signed)
[name], [name]                 ACCOUNT NO.:  [id number]   MEDICAL RECORD NO.:  [id number]          PATIENT TYPE:  INP   LOCATION:  5012                         FACILITY:  [facility]   PHYSICIAN:  Doralee Albino. Carola Frost, M.D. DATE OF BIRTH:  1929/07/05   DATE OF ADMISSION:  12/28/2006  DATE OF DISCHARGE:                               DISCHARGE SUMMARY   ADDENDUM:  On anticipated day of discharge, the patient did develop some  urinary retention, and subsequent workup demonstrated a urinary tract  infection. At the time of this dictation, cultures are still pending.  Foley was replaced. Also, the patient on January 08, 2007 was felt to  have some increasing erythema and mild tenderness along her incision, no  drainage, however; but these findings were consistent with some wound  cellulitis, and consequently we started her on antibiotics to treat both  her urinary tract infection and the wound cellulitis.  She continued to  work with physical therapy and to have attention directed to her  decubitus ulcer.   ADDITIONAL DISCHARGE MEDICATION:  Duricef 500 mg 1 p.o. q.12 h. through  January 15, 2007 for a 7-day course.   She is to return to see Dr. Carola Frost in 1 week, which should be on  January 15, 2007 as well, for removal of staples.      Doralee Albino. Carola Frost, M.D.  Electronically Signed     MHH/MEDQ  D:  01/08/2007  T:  01/08/2007  Job:  [id number]

## 2010-09-05 NOTE — Discharge Summary (Signed)
[name], [name]                 ACCOUNT NO.:  [id number]   MEDICAL RECORD NO.:  [id number]          PATIENT TYPE:  INP   LOCATION:  5012                         FACILITY:  [facility]   PHYSICIAN:  [name], M.D. DATE OF BIRTH:  [birth year]   DATE OF ADMISSION:  12/28/2006  DATE OF DISCHARGE:  01/06/2007                               DISCHARGE SUMMARY   DISCHARGE DIAGNOSIS:  Left distal femur supracondylar fracture with  intercondylar extension, status post open reduction and internal  fixation of left patella as well.   PROCEDURE PERFORMED:  On January 02, 2007, ORIF of left distal femur  with intercondylar extension, removal of hardware, and ORIF of patellar  fracture.   ADDITIONAL DIAGNOSES:  1. Coronary artery disease, status post myocardial infarction.  2. Mitral regurgitation.  3. Atrial fibrillation, on chronic Coumadin.  4. Hypertension.  5. Hyperlipidemia.  6. Non-insulin-dependent diabetes.  7. Prior fractures of the ankle, sternum, wrist, and left hip.   BRIEF SUMMARY OF HOSPITAL COURSE:  [name] is a 77 year old female who  was admitted and initially evaluated by Dr. August Cruz after a ground-level  fall resulting in severe left knee pain and deformity.  She was seen by  her cardiologist, Dr. Eden Cruz, who evaluated her and assisted with her  perioperative management.  She was able to go to surgery on January 02, 2007 after her INR corrected, and at that time underwent ORIF of her  femur without complication.  She also had removal of hardware and repair  of a patellar fracture.  Postoperatively, she did develop blood loss  anemia, for which she received 2 units of packed cells.  She did not  require any further transfusions.  On postop day 2, her wound was  changed and noted to be clean, dry, and intact, with no evidence of  infection.  She continued to undergo dressing changes, and at the time  of discharge her wound remained in excellent condition, with no sign  of  erythema or significant drainage.  She initially had pain controlled  with a PCA and was able to be changed to oral narcotics by postop day 3.  She did have a preoperative urinary tract infection which was treated  with Cipro, and this was run for the full 5-day course.  She was on  Lovenox for DVT prophylaxis both preoperatively and postoperatively as a  bridge to Coumadin therapy.  She did develop a decubitus ulcer in the  preoperative period that was followed by the wound care nurses  postoperatively, with pressure-relieving dressings and surveillance.  She underwent rehab evaluation on January 06, 2007.  At the time of  this summary, discharge destination, whether to rehab or a skilled  nursing facility, is unclear.  She did have resumption of normal bowel  activity on January 06, 2007 as well.  She had been mobilizing with  physical therapy, nonweightbearing on the left lower extremity, with  unrestricted range of motion of the knee and hip.   DISCHARGE INSTRUCTIONS:   ACTIVITY:  [name] is to continue nonweightbearing on the left lower  extremity with the assistance of physical therapy. She should be working  on range of motion, which can be both passive and active assisted, of  the knee, hip, and ankle.  Dressing changes should be performed daily as  long as there is drainage, and they may be left open to air once there  has been no drainage for 24 hours.  She may shower after there has been  no drainage for 48-72 hours.   DISCHARGE MEDICATIONS:  1. Lipitor 10 mg 1/2 pill daily.  2. Amiodarone 400 mg 1/2 pill daily.  3. Altace 10 mg 1 p.o. daily.  4. Colace and other over-the-counter stools softeners as needed, to      keep stools regular and soft, 200 mg once a day as a starting dose      for Colace.  5. Niaspan 1 tab 2 times daily.  6. Aspirin 81 mg p.o. daily.  7. Coumadin, take as directed to maintain INR in the appropriate range      at 2.5-3.5, unless  otherwise directed by Dr. Eden Cruz from      cardiology.   The patient is to return to see Dr. Carola Cruz in 10 days after discharge for  removal of staples and further x-rays.  She should call to schedule an  appointment with Dr. Eden Cruz from cardiology for followup as well 2-4  weeks from discharge.  She is to contact Dr. Magdalene Cruz office with any  problems, concerns, or questions related to her orthopedic injury and  surgical wound.  She is to continue with the pressure-relieving  dressings as provided by the  wound care service for her sacrum, and  this should be checked per protocol.  She is to be turned every 2 hours  to make sure there is no pressure on that area.      [name], M.D.  Electronically Signed     MHH/MEDQ  D:  01/06/2007  T:  01/06/2007  Job:  [id number]   cc:   [name], M.D.  Joanne Pick. Joanne Emms, MD, [facility]

## 2010-09-08 NOTE — Cardiovascular Report (Signed)
[name], [name] NO.:  [id number]   MEDICAL RECORD NO.:  [id number]          PATIENT TYPE:  OIB   LOCATION:  2855                         FACILITY:  [facility]   PHYSICIAN:  Charlton Haws, M.D.     DATE OF BIRTH:  Apr 23, 1930   DATE OF PROCEDURE:  05/29/2004  DATE OF DISCHARGE:                              CARDIAC CATHETERIZATION   CARDIOVERSION:  [name] is a 77 year old patient with history of coronary  disease, previous anterior wall myocardial infarction, and EF of 45%. She  has been in atrial fibrillation.  Her INRs have been therapeutic for more  than four weeks in our Coumadin clinic.  Her INR at the time of the  procedure was 2.2.   The patient was sedated with 75 mg of sodium Pentothal.   Three 200 joule biphasic shocks were delivered.  The patient converted to  normal sinus rhythm.   However, within the first 10 minutes of her cardioversion she had frequent  PACs and short bursts of PAF.  We gave her 2.5 mg of IV Lopressor and she  seemed to settle out in sinus rhythm.   IMPRESSION:  Successful cardioversion on therapeutic Coumadin.  The patient  will follow up with our Coumadin clinic next Thursday or Friday.  Will see  her back in the office in 2-3 weeks.   The ability of her to hold long term sinus rhythm is a little bit skeptical  given her frequent PACs and short burst of atrial fibrillation immediately  post cardioversion.  I suspect we will need to add Rhythmol or Flecainide to  her regimen as an outpatient.      PN/MEDQ  D:  05/29/2004  T:  05/29/2004  Job:  [id number]

## 2010-09-08 NOTE — Discharge Summary (Signed)
NAMEJAVONDA, [name] NO.:  [id number]   MEDICAL RECORD NO.:  [id number]          PATIENT TYPE:  ORB   LOCATION:  4522                         FACILITY:  [facility]   PHYSICIAN:  Ranelle Oyster, M.D.DATE OF BIRTH:  Jul 04, 1929   DATE OF ADMISSION:  01/18/2005  DATE OF DISCHARGE:  01/30/2005                                 DISCHARGE SUMMARY   DISCHARGE DIAGNOSES:  1.  Left subtrochanteric hip fracture status post closed reduction with      intramedullary nailing January 13, 2005.  2.  Pain management.  3.  Atrial fibrillation with chronic Coumadin.  4.  Postoperative anemia.  5.  Non-insulin-dependent diabetes mellitus.  6.  Postoperative urinary retention, resolved.  7.  Hypertension.  8.  Hyperlipidemia.   HISTORY OF PRESENT ILLNESS:  This is a 77 year old female with history of  chronic atrial fibrillation on Coumadin therapy who was admitted January 12, 2005 after a fall without loss of consciousness, sustaining a left  subtrochanteric hip fracture.  Preoperative clearance per cardiology, Dr.  Graciela Husbands.  INR on admission 1.6.  Underwent closed reduction with  intramedullary nailing January 13, 2005 per Dr. Otelia Sergeant.  Advised 25%  partial weightbearing with Coumadin resumed.  Cardiac status remained  stable.  She was admitted for a comprehensive rehabilitation program.   PAST MEDICAL HISTORY:  See discharge diagnoses.   SOCIAL HISTORY:  No alcohol.  Remote smoker.   ALLERGIES:  ZOCOR.   SOCIAL HISTORY:  Lives with her husband.  Husband able to assist as needed  on discharge.  One level home, three to four steps to entry.   MEDICATIONS PRIOR TO ADMISSION:  1.  Altace.  2.  Actos.  3.  Lipitor.  4.  Niaspan.  5.  Aspirin.  6.  B12 daily.  7.  Os-Cal daily.  8.  Potassium chloride.  9.  Multivitamin.  10. Lasix.  11. Coumadin 2.5 mg, except 5 mg on Wednesdays.  12. Vitamin E.  13. Amiodarone 200 mg daily.   HOSPITAL COURSE:  Patient with  progressive gains while in rehabilitation  services with therapies initiated daily.  The following issues were followed  during the patient's rehabilitation course.  Pertaining to [name]'s left  subtrochanteric hip fracture, surgical site healing nicely.  No signs of  infection.  Partial weightbearing.  Ambulating household distances with a  walker.  Home health therapies have been arranged.  She remained on  scheduled OxyContin sustained release 10 mg every 12 hours x1 week,  oxycodone for breakthrough pain, as well as Robaxin for muscle spasms, with  good results.  Chronic Coumadin had been ongoing for history of atrial  fibrillation with cardiac rate controlled.  Latest INR of 2.6.  She would  follow up with Shelby Dubin at the [facility].   Postoperative anemia.  Maintained on iron supplement.  The latest hemoglobin  9.3, hematocrit 27.2.   She had a history of non-insulin-dependent diabetes mellitus.  Blood sugars  controlled, 139, 124, 120.  She would follow up with her primary M.D.   Blood pressures without  orthostatic changes on Altace, Lasix, and  amiodarone.  She exhibited no signs of fluid overload.  Again, she would  follow up with her primary M.D.   Functionally, she was minimal assist, ambulating with a rolling walker.  Minimal assist for transfers, needing max assist for lower body dressing,  simple setup for upper body.  Again, she was discharged to home.   LABORATORY DATA:  Latest labs showed an INR of 2.6.  Hemoglobin 9.3,  hematocrit 27.2.  Sodium 135, potassium 3.7, BUN 16, creatinine 0.8.   DISCHARGE MEDICATIONS AT THE TIME OF DICTATION:  1.  Coumadin, latest dose of 2 mg.  2.  Altace 10 mg daily.  3.  Actos 30 mg daily.  4.  Amiodarone 200 mg daily.  5.  Lasix 20 mg daily.  6.  Niaspan twice daily.  7.  Potassium chloride 10 mEq daily.  8.  Trinsicon twice daily.  9.  Lipitor daily.  10. OxyContin sustained released 10 mg every 12  hours x1 week.  11. Robaxin 500 mg every six hours as needed for spasms.  12. Vicodin as needed for pain.   FOLLOW UP:  She would follow up with Dr. Dagoberto Ligas for medical management, Dr.  Otelia Sergeant in orthopedic services, Dr. Eden Emms in cardiology services, and a home  health nurse had been arranged to check INR on Wednesday, January 31, 2005,  with results to Shelby Dubin at [facility] at [phone].      [name], P.A.      Ranelle Oyster, M.D.  Electronically Signed    DA/MEDQ  D:  01/29/2005  T:  01/29/2005  Job:  [id number]   cc:   Ranelle Oyster, M.D.  Fax: [phone]   Alfonse Alpers. Dagoberto Ligas, M.D.  Fax: [phone]   Kerrin Champagne, M.D.  Fax: [phone]   Charlton Haws, M.D.  [address]  [address]  [place]  [place] [address]   Shelby Dubin  [facility]

## 2010-09-08 NOTE — Discharge Summary (Signed)
[name], BUSBIN NO.:  [id number]   MEDICAL RECORD NO.:  [id number]          PATIENT TYPE:   LOCATION:                                 FACILITY:   PHYSICIAN:  Kerrin Champagne, M.D.        DATE OF BIRTH:   DATE OF ADMISSION:  01/12/2005  DATE OF DISCHARGE:  01/18/2005                                 DISCHARGE SUMMARY   ADMISSION DIAGNOSES:  1. Left proximal femur fracture.  2. Chronic atrial fibrillation.  3. Diabetes mellitus.  4. Dyslipidemia.  5. Hypertension  6. Status post open reduction internal fixation of a previous ankle      fracture.  7. Coronary artery disease, status post myocardial infarction January 09, 1997.   DISCHARGE DIAGNOSIS:  1. Left proximal femur fracture.  2. Chronic atrial fibrillation.  3. Diabetes mellitus.  4. Dyslipidemia.  5. Hypertension  6. Status post open reduction internal fixation of a previous ankle      fracture.  7. Coronary artery disease, status post myocardial infarction January 09, 1997.  8. Posthemorrhagic anemia, requiring blood transfusion.   PROCEDURES:  On January 13, 2005, the patient underwent closed reduction  and intramedullary nailing of a left subtrochanteric femur fracture,  performed by Dr. Otelia Sergeant under general anesthesia.   CONSULTATIONS:  Dr. Graciela Husbands for cardiology.   BRIEF HISTORY:  The patient is a 77 year old female who fell on the day of  admission injuring her left hip.  She initially was seen at the [facility], where radiographs demonstrated a left femur fracture.  The  patient was transported to [facility] for definitive care as she  was a resident of [facility].  She did have significant cardiac history  and was on chronic Coumadin use for atrial fib.  She was admitted to the  hospital and a cardiac consult was obtained, as the patient would require  surgical intervention.  Preoperatively, her INR was 1.7.  She was felt to be at an  acceptable risk  for surgical intervention and underwent the above-stated procedure on  January 13, 2005.   BRIEF HOSPITAL COURSE:  Upon admission, the patient was placed at bedrest  with Buck's traction on the left lower extremity.  The patient was seen by  Dr. Graciela Husbands who indicated the patient was stable to proceed with surgical  intervention.  The patient was n.p.o. after midnight the evening before  surgery.  She was given the usual preoperative antibiotics.  She remained on  her amiodarone 200 mg daily.  Postoperatively, Coumadin was resumed for DVT  and PE prophylaxis as well as her chronic atrial fib.  Adjustments in  Coumadin were made according to daily pro time by the pharmacist at [facility].  The patient did have postoperative anemia and received 2 units of packed red  blood cells.  She was treated with IV analgesics initially and weaned to  p.o. analgesics throughout the hospital stay.  She was placed on Trinsicon  for her anemia.  The patient's Foley catheter was discontinued on the second  postoperative day and she was able to void.  Physical therapy was initiated  postoperatively.  The patient was allowed 25% partial weightbearing on the  left lower extremity.  She was very slow progress with her physical therapy.  A rehab consult was obtained and she was felt to be a suitable candidate for  inpatient rehabilitation.  A bed was available on January 18, 2005, and  she was transferred in stable condition.   PERTINENT LABORATORY VALUES:  Admission CBC with hemoglobin 11.7, hematocrit  34.3.  Hemoglobin at time of transfer was 8.8 and hematocrit 26.  The  patient did receive total of 4 units of packed red blood cells during the  hospital stay.  Admission INR was 1.6 and at the time of transfer to rehab  3.7.  Chemistry studies on admission with glucose 118, calcium 8.3, AST 45,  ALT 41 and remaining values normal.  B-MET throughout the hospital stay  remained  normal with the exception of slightly elevated glucose ranging from  122 to 94 and calcium ranging from 7.8 to 8.3.  Urinalysis on admission  showed large hemoglobin, small leukocyte esterase, few epithelial cells, 3  WBCs per high power field, and too numerous to count RBCs per high power  field.  Urinalysis on September 27, showed positive nitrite, trace leukocyte  esterase, few epithelial cells, many bacteria.  Urine culture indicated  100,000 colonies of E-coli.  The patient's treatment was initiated at  transfer to rehab.  Chest x-ray on September 27, showed pulmonary  parenchymal findings worrisome for multifocal pneumonia, bilateral pleural  effusions, and cardiomegaly.  Chest x-ray on admission showed cardiac  enlargement and mild vascular congestion.  EKG on admission with normal  sinus rhythm..  The patient's abnormal chest x-ray was passed on to the  rehab physicians and treatment was initiated at rehab as well.   PLAN:  The patient will have physical therapy at the inpatient  rehabilitation daily for ambulation and gait training, range of motion, and  strengthening exercises.  Her wounds have been healing well and required  only light dressing daily.  She is 25% partial weightbearing and will  continue this until further notice.  Occupational therapy consult for ADLs.  Abnormal chest x-ray and UA for initiation of treatment at the rehab center.  She will follow up with Dr. Otelia Sergeant after her stay at the rehab unit.  He will  be seeing her throughout her whole her rehab stay as well.   List of medications were sent with the patient for continuation.   She will continue on a diabetic diet.   Any questions regarding her orthopedic care would be addressed by calling  Dr. Otelia Sergeant.   CONDITION ON DISCHARGE:  Stable.      [name], P.A.      Kerrin Champagne, M.D.  Electronically Signed   SMV/MEDQ  D:  02/04/2006  T:  02/05/2006  Job:  [id number]

## 2010-09-08 NOTE — Consult Note (Signed)
[name], BRAZZLE NO.:  [id number]   MEDICAL RECORD NO.:  [id number]          PATIENT TYPE:  INP   LOCATION:  5006                         FACILITY:  [facility]   PHYSICIAN:  Duke Salvia, M.D.  DATE OF BIRTH:  [birth year]   DATE OF CONSULTATION:  01/13/2005  DATE OF DISCHARGE:                                   CONSULTATION   Thank you very much for asking [place] to see Ms. Theora Gianotti in preoperative  evaluation.   Mrs. Ketron is a 77 year old woman who fell down and broke her left femur  with a spiral fracture to the proximal third who needs to go for surgical  repair.   She has a history of coronary artery disease. She is status post myocardial  infarction in 1998, the details of which are not available. She underwent  angioplasty at that time and has done well from a coronary disease point of  view, according to her daughter.  The patient is quite sedated at the  present time.  She has had some recent atypical chest pains that are  nonexertional and have been typically quite brief.  They have been  unassociated with any change in exercise capacity and are not accompanied by  dyspnea or diaphoresis.  However, as it had been more than a year since her  prior nonischemic nuclear stress test.  Another stress scan had been  scheduled for later this week.   She also has paroxysmal atrial fibrillation and underwent cardioversion in  February 2006, but failed to hold sinus rhythm. She was started on  amiodarone and recardioverted in June.  Has been in sinus rhythm since.  She  apparently has been tolerating the amiodarone quite well.  LFTs and TSH have  been followed by Dr. Dagoberto Ligas and they apparently have been normal.   PAST MEDICAL HISTORY:  __________  1.  Diabetes.  2.  Dyslipidemia.  3.  Hypertension.   PAST SURGICAL HISTORY:  1.  Tubal ligation.  2.  Status post ORIF three years ago.   CURRENT MEDICATIONS:  1.  Aspirin.  2.  Coumadin.  3.  Altace 10  daily.  4.  Actos 30 daily.  5.  Lipitor 10 daily.  6.  Niaspan 750 twice daily.  7.  Lasix 20 daily.  8.  Potassium 10 daily.  9.  Coumadin 2.5 alternating with 5.   She is intolerant of Zocor.   PHYSICAL EXAMINATION:  GENERAL APPEARANCE:  She is in quite a significant  amount of distress, is quite somnolent from her analgesia.  VITAL SIGNS:  Blood pressure 110/53, pulse 77, temperature 99.4.  NECK:  The neck veins were flat.  There was a murmur transmitted into the  carotids bilaterally.  The carotids were also brisk in their upstroke.  BACK:  Not examined.  LUNGS:  Clear laterally.  CARDIOVASCULAR:  Heart sounds were regular with a widely split S2 and a 2 to  3/6 systolic ejection murmur that was early to mid peaking.  ABDOMEN:  Soft with active bowel sounds without midline pulsation or  hepatomegaly.  EXTREMITIES:  Femoral pulses were 2+ on the right, distal pulses were intact  on the right.  There was no edema.  NEUROLOGIC:  Grossly normal apart from the sedation.   Electrocardiogram dated yesterday demonstrated sinus rhythm at 72 with  intervals of 0.17/0.10/0.38.  The axis was 13 degrees.  Electrocardiogram  was otherwise normal.   IMPRESSION:  1.  Spiral fracture of the left femur requiring surgical repair.  2.  Coronary artery disease.      1.  Prior myocardial infarction.      2.  Ejection fraction of 45 to 50% and nonischemic in 2005 by patient          report and inference from the chart.      3.  Recent atypical chest pain unassociated with exertion or accompanied          by changes in exercise tolerance--Myoview scheduled for this week.  3.  Paroxysmal atrial fibrillation on amiodarone.  4.  Thromboembolic risk factors.      1.  Hypertension.      2.  Diabetes.      3.  Subtherapeutic INR on admission.  5.  Dyslipidemia.   Mrs. Schweickert has ischemic heart disease and atrial fibrillation.  Given the  need for surgery and the her really quite excellent  functional status and  only these atypical chest pains with a negative Myoview a year ago, I think  it is reasonable to proceed with surgery and her risks should be acceptable.   Maintaining perioperative amiodarone and adding perioperative beta-blockers  should help both with risks of recurrence of atrial fibrillation and  __________ with the risks of any ischemic challenges.   We will plan to follow her closely.   RECOMMENDATIONS:  Based on the above, therefore we will:  1.  Add beta-blockers to try and maintain a heart rate in the 60s.  2.  Continue amiodarone.  3.  The amiodarone dose is 200 mg daily.   Thank you for the consultation.           ______________________________  Duke Salvia, M.D.     SCK/MEDQ  D:  01/13/2005  T:  01/15/2005  Job:  [id number]   cc:   Charlton Haws, M.D.  [address]  [address]  [place]  [place] [address]   Alfonse Alpers. Dagoberto Ligas, M.D.  Fax: [phone]

## 2010-09-08 NOTE — Assessment & Plan Note (Signed)
[facility]                            CARDIOLOGY OFFICE NOTE   [name], [name]                        MRN:          [id number]  DATE:03/28/2006                            DOB:          October 06, 1929    [name] returns today for followup.  She has a history of an anterior wall  MI with angioplasty of the LAD in April 2006.  Her EF has been in the  40% range.  She also has a history of atrial fibrillation and has been  on amiodarone.   The patient's last Myoview study was in April 2007.  At that time she  had an apical wall infarct with no ischemia and an EF of 50%.   From a cardiac perspective, she has been stable.  She has not had any  PND, orthopnea, or lower extremity edema.   She is on Actos for her diabetes and I asked her to talk to Dr. Dagoberto Ligas  in regards to its fluid-retaining properties.  She has been maintained  on a low dose of Lasix at 40 mg a day.   She has been maintaining sinus rhythm for quite some time.  I think it  is reasonable to cut her amiodarone back again from 400 a day to 200 a  day.  When I see her back in 6 months we will reassess her LV function  by echocardiogram and do LFTs, PFTs, and a thyroid function test.   She has had problems with her Coumadin levels going way too high and I  would like to get her off amiodarone due to its longterm toxicity and  Coumadin interaction if possible.   REVIEW OF SYSTEMS:  She has not had any significant chest pains or  angina.  She has not had PND or orthopnea and no lower extremity edema.   She is on multiple medications which are listed in the chart.  These  include:  1. Niaspan 500 b.i.d.  2. Actos 30 a day.  3. Coumadin as directed by our clinic.  4. Lasix 40 mg a day.  5. Lipitor 10 a day.  6. Baby aspirin daily.  7. Amiodarone 400 a day.  8. K-Dur 10 a day.  9. Altace 10 a day.  10.Lopressor 50 b.i.d.   IMPRESSION:  Stable ischemic cardiomyopathy, ejection fraction  anywhere  from 40-50% depending on modality.  Followup echocardiogram in March to  reassess.  Continue afterload reduction and low-dose Lasix.  In the  future, the patient can be switched to Coreg if needed but her diabetes  seems to be under good control.  She will talk to Dr. Dagoberto Ligas about  limiting the oral hypoglycemics that she is on in regards to fluid  retention.   We will taper her amiodarone down to 200 mg a day.  She has had atrial  fibrillation but has been maintaining sinus.  She is on Coumadin anyway.  When I see her back in March we may stop her amiodarone if there are no  recurrences of her atrial fibrillation.   Overall, her functional class is  excellent and I think she has had  significant improvement.  So long as she is on her medication and  maintaining sinus rhythm, I think her LV function will stay improved.     [name]. Eden Emms, MD, [facility]  Electronically Signed    PCN/MedQ  DD: 03/28/2006  DT: 03/28/2006  Job #: [id number]

## 2010-09-08 NOTE — Op Note (Signed)
NAMENEHEMIAH, MONTEE NO.:  [id number]   MEDICAL RECORD NO.:  [id number]          PATIENT TYPE:  OIB   LOCATION:  2856                         FACILITY:  [facility]   PHYSICIAN:  Charlton Haws, M.D.     DATE OF BIRTH:  Sep 06, 1929   DATE OF PROCEDURE:  DATE OF DISCHARGE:                                 OPERATIVE REPORT   PROCEDURE:  Cardioversion.   SURGEON:  Charlton Haws, M.D.   Ms.  Riviere is a 77 year old patient of mine.  She has had atrial  fibrillation.  She has failed previous cardioversion.  She was placed on  amiodarone.  Repeat cardioversion was attempted today.   The patient's INR at the time of the procedure was 4.  A single 150 joule  biphasic shock was delivered.  The patient converted from atrial  fibrillation at a rate at 80 to sinus rhythm at a rate of 70.  She was  anesthetized by anesthesia with 125 mg of sodium pentothal.   IMPRESSION:  Successful direct current cardioversion on amiodarone.  The  patient will follow up with our Coumadin Clinic on Friday.      PN/MEDQ  D:  09/04/2004  T:  09/04/2004  Job:  [id number]

## 2010-09-08 NOTE — Op Note (Signed)
[name], [name] NO.:  [id number]   MEDICAL RECORD NO.:  [id number]          PATIENT TYPE:  INP   LOCATION:  5006                         FACILITY:  [facility]   PHYSICIAN:  [name], M.D.   DATE OF BIRTH:  Jun 08, 1929   DATE OF PROCEDURE:  01/13/2005  DATE OF DISCHARGE:                                 OPERATIVE REPORT   PREOPERATIVE DIAGNOSIS:  Comminuted left subtrochanteric hip fracture with  butterfly fragment medially.   POSTOPERATIVE DIAGNOSIS:  Comminuted left subtrochanteric hip fracture with  butterfly fragment medially.   OPERATION PERFORMED:  Left subtrochanteric femur fracture, closed reduction  with intramedullary nailing using a [employer]  nail measuring 11 mm x  380 mm with 130 degree lag screw.  Lag screw measuring 100 mm in length, two  distal interlocking screws in static position.   SURGEON:  [name], M.D.   ASSISTANT:  Lianne Cure, P.A.   ANESTHESIA:  General orotracheal anesthesia, Bedelia Person, M.D.   ESTIMATED BLOOD LOSS:  200 mL.   DRAINS:  Foley catheter to straight drain.   FLUIDS REPLACED:  The patient received fluids of two units of packed red  blood cells.  Preoperatively he has received two units of fresh frozen  plasma in addition to 1500 mL of crystalloid.   INDICATIONS FOR PROCEDURE:  The patient is a 77 year old female to fell in a  doorway injuring her left femur.  Seen at [facility] where  radiographs demonstrated a left femur fracture.  Phone call and the patient  was transported to [facility] as she is a resident of [place]  and [place]. She has a significant cardiac history, is on amiodarone  as well as type 2 diabetes.  Atrial fibrillation with Coumadin.  She has  undergone normalization of her PT and PT ratio using two units of fresh  frozen plasma today.  As a preoperative precaution.  Her INR of 1.7 today.  In addition, she underwent preoperative  evaluation by cardiology who felt  that she was at an acceptable risk.   INTRAOPERATIVE FINDINGS:  As above.   DESCRIPTION OF PROCEDURE:  This patient underwent surgery to her left femur  after undergoing induction of general anesthetic and intubation.  She was  transferred to the [facility] fracture table  with a groin post and the left  leg in longitudinal traction, the right leg in well leg holder.  She had  standard preoperative antibiotics of Ancef.  Underwent prep with DuraPrep  solution from the left lower rib margin over the lateral aspect of the left  hip and thigh circumferentially about the left knee and proximal tibia.  She  was draped in the usual manner with iodine exclusion Vi-drape.  Care was  taken to first reduce the fracture prior to prepping the skin and draping.  This was done using C-arm fluoroscopy to ascertain reduction of the major  fracture fragments and visually manipulating the fracture fragments using C-  arm fluoroscopy.  Leg then brought into adduction in order to allow for  prominence of  the tip of the trochanter to allow for ease of insertion of  the trochanteric nail.   Following reduction then in an AP and lateral planes.  It was noted in  lateral plane, the patient remained somewhat displaced with proximal  fracture fragment flexed and distal fracture fragments sagging posteriorly.  No crutch was available and if two was to be used during surgery to reduce  and this was made available. Then following the prep and drape, the incision  initially made directly across from the tip of the trochanter extending  approximately over an area of about 3 to 3-1/2 inches through the skin and  subcutaneous layers carried down to the tensor fascia. This was incised in  line with the skin incision.  The tip of the trochanter then palpated and a  curved awl then gently inserted onto the tip of the trochanter, observed on  C-arm fluoroscopy to be in good position and  alignment.  This was then  gently placed into the intramedullary canal traveling along the lateral  cortex of the proximal femur.  And also in line with the flexed proximal  femur as best as possible.  With this then the proximal femoral canal was  able to be entered using a ball tip guide pin.  This was first passed  through the awl; however, did not necessitate continued use of the awl. We  found it very difficult to attempt to pass the guide pin through the  butterfly fragments of the proximal femur such that the F2 was necessary in  order to reduce the fracture site and this was done appropriately in the  anterior posterior plane while reduction was maintained in the medial  lateral plane.  Changing back from anterior to posterior views and lateral  views using a C-arm then the fracture was noted to be reduced and then the  guide pin was then carefully passed across the fracture site. This did  require nearly a period of 20 to 30 minutes to allow for passage as the  fracture was complex. Once this was completed then the guide pin was passed  down to the distal femoral physis and the length of the femur  measured at  about 410 mm as it was felt that the nail did not need to be quite this long  and as it would be over the flare of the femur distally and difficult to  perform free hand interlocking screws, the nail was allowed to be 380 mm  which was much better position alignment proximal to the flare of the distal  femoral condyles.  Quite acceptable.  This was then placed into position and  alignment.  Carefully the proximal hole aligned with 130 degree hole  alignment into the mid and inferior half of the head and neck on the AP  view.   Note that the nail was placed after undergoing reaming of the intramedullary  channel and this was reamed from 8.5 mm up to 12 mm in 1 mm increments until the final 1 mm which require 0.5 mm increments as there was some chattering  occurring with the  reamer passing through the isthmus.  This completed the  guide pin was kept in place throughout the reaming procedure. Then the nail  was able to be passed over the guide pin and then joggled into place.  No  mallet or hammer was necessary to place the nail. Guide pin removed without  difficulty.  The sleeve for the proximal interlocking  fluted nail was then  carefully place and screwed into place onto the handle.  The sleeve and  drill sleeve were then carefully passed through this.  An incision was made  into the skin approximately 2 cm in length and then subcutaneous layer  spread using hemostat down to the tensor fascia and then this was spread  bluntly allowing the tube then to pass to the lateral aspect of the femoral  cortex across from the proximal interlocking fluted nail openings.  This was  then carefully tightened down until the tube approximated the lateral cortex  of the proximal femur.  This in position and alignment, then guide pin was  then passed for the proximal fluted nail and this was then passed through  the intramedullary nail proximal openings.  Appeared to be in excellent  position and alignment both AP and lateral planes within the posterior one  half on the lateral view and within the center to center inferior half of  the head and neck on the AP view.  Guide pin was passed down to about 4 mm  short of subchondral bone.  The length of the guide pin felt to measure  about 100 mm to lateral cortex of the femur.  100 mm lag screw was  chosen  for the proximal interlag.  Reamer then used to ream the lateral cortex.  Entry into the first opening for the proximal fluted nail of the nail  itself.  And then the fluted proximal nail was inserted and impacted into  place.  This completed then the proximal intramedullary nail fixation pin  was then carefully screwed home attaching the nail to the fluted proximal  nail statically.  This completed, then the tubes for placement  of the  proximal interlocking fluted nail were then removed following removal of the  guide pin as well as the insertion handle.  The proximal handle then removed  from the nail.  Leg then brought into abduction which tended to diminish any  tendency to varus at the fracture site.  C-arm fluoroscopy was brought into  perpendicular position to the distal portion of the femur making the  openings in the distal intramedullary nail full circles and symmetric  openings. This was at an exactly perpendicular direction.  Stab incision was  then made opposite the holes for the distal interlocking screws.  Care taken  to ensure that the length of the femur was maintained.  Free hand drilling  of these holes then performed using 4.5 drill, first the proximal drill hole  and then the distal drill hole after first filling the proximal drill hole after measuring a depth of approximately 42 mm.  More distal then filled  with a 46 mm screw.  This provided excellent fixation of the distal  interlocking screw mechanism.  Observed on AP and lateral view filling the  holes for the distal interlocking screws in both planes.  Permanent images  then obtained on C-arm in both AP and lateral planes documenting the  intramedullary nail position and alignment, the fracture position and  alignment, fracture remained slightly flexed and with some significant  amount of persistent displacement of the butterfly fragments; however, this  could not be reduced closed as the fragments were extremely unstable.  It  was felt that bone grafting due to reaming would provide adequate bone  material for this to go on and heal.  With this then, irrigation was  performed through all the incisions.  The most proximal incision  closed by  approximating the tensor fascia with interrupted 0 Vicryl sutures, deep  subcu layers with interrupted 0 and #1 Vicryl sutures, more subcutaneous  layers with interrupted 2-0 Vicryl sutures and skin  closed with stainless  steel staples.  The incision for the proximal interlocking fluted nail was  then closed by approximating subcu layers with interrupted 2-0 Vicryl  sutures and the skin closed with stainless steel staples.  The distal two  interlocking screw holes were filled by closing after irrigation with  interrupted 2-0 Vicryl sutures, subcutaneous layers and stainless steel  staples.  Adaptic 4 x 4s fixed to the skin with HypaFix tape.  The patient  then returned to her bed, reactivated, extubated and returned to recovery  room in satisfactory condition.  All instrument and sponge counts were  correct.      [name], M.D.  Electronically Signed     JEN/MEDQ  D:  01/13/2005  T:  01/15/2005  Job:  [id number]

## 2010-09-08 NOTE — Assessment & Plan Note (Signed)
[facility]                            CARDIOLOGY OFFICE NOTE   RICCI, PAFF                        MRN:          [id number]  DATE:07/04/2006                            DOB:          21-Mar-1930    [name] returns today in followup.  She has history of coronary artery  disease with previous anterior MI.  Her EF has been as low as 40%.  She  had an echo today, and her EF, indeed, is around 40% with mild MR.  I  think she would be a good candidate for the Determine trial.  Her last  Myoview on July 23, 2005 showed a fairly dense anterior apical septal  scar.  There was no ischemia.  She is followed by Dr. Dagoberto Ligas.   This is important in regards to her risk for heart failure.  She  continues on amiodarone and Altace.  This has been switched to generic  ramipril.   She is not on a beta blocker.  Has a resting heart rate that tends to  run 55.  She is prone to pauses.   In regards to the patient's symptoms, she has been doing well.  She did  have, a few years ago, an episode of passing out.  She also had an  episode 3 years ago where there was a questions of passing out before  she hurt her right hip.  I talked to her at length about the Determine  trial.  I do not think she will meet traditional criteria for  defibrillator.  We will do a cardiac MRI next week, and then she if she  can be enrolled in the Determine trial.  She will continue her Coumadin  for PAF.  H   Her medications include:  1. Niaspan 500 b.i.d.  2. Coumadin as directed.  3. Lasix 40 a day.  4. Lipitor 10 a day.  5. Baby aspirin a day.  6. Amiodarone 200 b.i.d.  7. K-Dur 10 a day.  8. Altace 10 a day.   EXAMINATION:  Her exam is remarkable for a blood pressure of 120/70.  Pulse 60 and regular.  HEENT:  Normal.  Carotids normal without bruit.  LUNGS:  Clear.  There is an S1 and S2 with a soft MR murmur at the apex.  ABDOMEN:  Benign.  LOWER EXTREMITIES:  Intact.  No  edema.   IMPRESSION:  Ischemic cardiomyopathy.  Ejection fraction 40% range.  Continue ACE inhibitor.  Not on beta blocker due to relative  bradycardia.  Continue amiodarone for paroxysmal atrial fibrillation as  well as Coumadin therapy.   The patient will have a cardiac MRI next week.  I suspect she will have  a dense anterior apical scar, and be a reasonable candidate for  randomization in the Determine trial for a Bi-V ICD.   We will continue her current medications.  Her MR is stable, and she is  not having significant exertional dyspnea.     [name]. Eden Emms, MD, [facility]  Electronically Signed    PCN/MedQ  DD: 07/04/2006  DT: 07/05/2006  Job #: [id number]

## 2010-09-12 ENCOUNTER — Telehealth: Payer: Self-pay | Admitting: *Deleted

## 2010-09-12 NOTE — Telephone Encounter (Signed)
INPATIENT ADMISSION 09/19/10 FOR TIKOSYN LOADING.   AUTH # [id number]. IF PATIENT DOES NOT GO ON 09/19/10 WE NEED TO CONTACT [employer] TO CHANGE THE DATE

## 2010-09-19 ENCOUNTER — Inpatient Hospital Stay: Admit: 2010-09-19 | Payer: Self-pay | Admitting: Cardiovascular Disease

## 2010-09-19 ENCOUNTER — Ambulatory Visit: Payer: Medicare PPO

## 2010-09-19 ENCOUNTER — Ambulatory Visit (HOSPITAL_COMMUNITY): Admission: RE | Admit: 2010-09-19 | Payer: Medicare PPO | Source: Ambulatory Visit | Admitting: Cardiovascular Disease

## 2010-09-19 ENCOUNTER — Encounter: Payer: Medicare PPO | Admitting: *Deleted

## 2010-09-21 ENCOUNTER — Ambulatory Visit (INDEPENDENT_AMBULATORY_CARE_PROVIDER_SITE_OTHER): Payer: Medicare PPO | Admitting: *Deleted

## 2010-09-21 DIAGNOSIS — I4891 Unspecified atrial fibrillation: Secondary | ICD-10-CM

## 2010-09-21 DIAGNOSIS — Z7901 Long term (current) use of anticoagulants: Secondary | ICD-10-CM | POA: Insufficient documentation

## 2010-09-21 LAB — POCT INR: INR: 1.9

## 2010-09-25 ENCOUNTER — Encounter: Payer: Medicare PPO | Admitting: *Deleted

## 2010-09-26 ENCOUNTER — Inpatient Hospital Stay (HOSPITAL_COMMUNITY)
Admission: AD | Admit: 2010-09-26 | Discharge: 2010-09-29 | DRG: 310 | Disposition: A | Discharge status: Home / Self Care | Payer: Medicare PPO | Source: Ambulatory Visit | Attending: Cardiovascular Disease | Admitting: Cardiovascular Disease

## 2010-09-26 ENCOUNTER — Ambulatory Visit (INDEPENDENT_AMBULATORY_CARE_PROVIDER_SITE_OTHER): Payer: Medicare PPO

## 2010-09-26 ENCOUNTER — Ambulatory Visit (INDEPENDENT_AMBULATORY_CARE_PROVIDER_SITE_OTHER): Payer: Medicare PPO | Admitting: Internal Medicine

## 2010-09-26 VITALS — Wt 171.0 lb

## 2010-09-26 DIAGNOSIS — Z9861 Coronary angioplasty status: Secondary | ICD-10-CM

## 2010-09-26 DIAGNOSIS — E119 Type 2 diabetes mellitus without complications: Secondary | ICD-10-CM | POA: Diagnosis present

## 2010-09-26 DIAGNOSIS — E785 Hyperlipidemia, unspecified: Secondary | ICD-10-CM | POA: Diagnosis present

## 2010-09-26 DIAGNOSIS — I6529 Occlusion and stenosis of unspecified carotid artery: Secondary | ICD-10-CM | POA: Diagnosis present

## 2010-09-26 DIAGNOSIS — I4891 Unspecified atrial fibrillation: Secondary | ICD-10-CM

## 2010-09-26 DIAGNOSIS — I2510 Atherosclerotic heart disease of native coronary artery without angina pectoris: Secondary | ICD-10-CM | POA: Diagnosis present

## 2010-09-26 DIAGNOSIS — Z79899 Other long term (current) drug therapy: Secondary | ICD-10-CM

## 2010-09-26 DIAGNOSIS — I10 Essential (primary) hypertension: Secondary | ICD-10-CM | POA: Diagnosis present

## 2010-09-26 DIAGNOSIS — I2589 Other forms of chronic ischemic heart disease: Secondary | ICD-10-CM | POA: Diagnosis present

## 2010-09-26 DIAGNOSIS — Z7901 Long term (current) use of anticoagulants: Secondary | ICD-10-CM

## 2010-09-26 DIAGNOSIS — Z7982 Long term (current) use of aspirin: Secondary | ICD-10-CM

## 2010-09-26 DIAGNOSIS — I428 Other cardiomyopathies: Secondary | ICD-10-CM | POA: Diagnosis present

## 2010-09-26 DIAGNOSIS — I252 Old myocardial infarction: Secondary | ICD-10-CM

## 2010-09-26 LAB — BASIC METABOLIC PANEL
Chloride: 104 mEq/L (ref 96–112)
Creatinine, Ser: 0.8 mg/dL (ref 0.4–1.2)
Potassium: 4.5 mEq/L (ref 3.5–5.1)
Sodium: 138 mEq/L (ref 135–145)

## 2010-09-26 LAB — POCT INR: INR: 2.2

## 2010-09-26 LAB — PROTIME-INR
INR: 2.3 ratio — ABNORMAL HIGH (ref 0.8–1.0)
Prothrombin Time: 23.7 s — ABNORMAL HIGH (ref 10.2–12.4)

## 2010-09-26 NOTE — Progress Notes (Signed)
Patients present to CVRR clinic for Tikosyn education. Counseled on MOA, importance of not missing doses, and drug interactions. Updated medication list and allergies. Will draw K+, Magnesium, and BMET today. If labs within range,  then patient will present to the hospital this afternoon to initiate tikosyn loading.

## 2010-09-26 NOTE — Assessment & Plan Note (Addendum)
K+ 4.5, Mg++ 1.9, Scr 0.8 with Clcr >60 (will receive 500 mcg bid dose). Patient advised to report to hospital for tikosyn loading. Also on coumadin for stroke prevention in the setting of afib with therapeutic INR of 2.3. Patient's copay for Tikosyn will be $85/ month. Her pharmacy is not authorized to dispense Tikosyn but I was informed by the pharmacist at [facility] that [facility] is authorized and informed patient to go there.

## 2010-09-27 LAB — BASIC METABOLIC PANEL
BUN: 20 mg/dL (ref 6–23)
CO2: 29 mEq/L (ref 19–32)
Calcium: 8.3 mg/dL — ABNORMAL LOW (ref 8.4–10.5)
Chloride: 104 mEq/L (ref 96–112)
Creatinine, Ser: 0.68 mg/dL (ref 0.4–1.2)
GFR calc Af Amer: >60 mL/min (ref >60)
GFR calc non Af Amer: >60 mL/min (ref >60)
Glucose, Bld: 90 mg/dL (ref 70–99)
Potassium: 4.4 mEq/L (ref 3.5–5.1)
Sodium: 139 mEq/L (ref 135–145)

## 2010-09-27 LAB — CBC
MCH: 31.6 pg (ref 26.0–34.0)
Platelets: 177 10*3/uL (ref 150–400)
RBC: 4.14 MIL/uL (ref 3.87–5.11)

## 2010-09-27 LAB — MAGNESIUM: Magnesium: 2.4 mg/dL (ref 1.5–2.5)

## 2010-09-28 LAB — BASIC METABOLIC PANEL
BUN: 17 mg/dL (ref 6–23)
CO2: 27 mEq/L (ref 19–32)
Glucose, Bld: 109 mg/dL — ABNORMAL HIGH (ref 70–99)
Potassium: 4.3 mEq/L (ref 3.5–5.1)
Sodium: 139 mEq/L (ref 135–145)

## 2010-09-28 LAB — PROTIME-INR: Prothrombin Time: 24.8 seconds — ABNORMAL HIGH (ref 11.6–15.2)

## 2010-09-29 ENCOUNTER — Inpatient Hospital Stay (HOSPITAL_COMMUNITY): Payer: Medicare PPO

## 2010-09-29 LAB — BASIC METABOLIC PANEL
CO2: 26 mEq/L (ref 19–32)
Chloride: 101 mEq/L (ref 96–112)
GFR calc Af Amer: >60 mL/min (ref >60)
Glucose, Bld: 96 mg/dL (ref 70–99)
Sodium: 137 mEq/L (ref 135–145)

## 2010-09-29 NOTE — Op Note (Signed)
  [name], [name] NO.:  [id number]  MEDICAL RECORD NO.:  [id number]  LOCATION:  2006                         FACILITY:  [facility]  PHYSICIAN:  Luis Abed, MD, FACCDATE OF BIRTH:  04-12-1930  DATE OF PROCEDURE: DATE OF DISCHARGE:                              OPERATIVE REPORT   The patient has been loaded with Tikosyn.  There has been careful attention paid to her Coumadin level and her renal function.  Decision was made to proceed with cardioversion today.  CARDIOVERSION:  Anterior-posterior pads were placed.  The biphasic defibrillator was used.  Anesthesia was present.  The patient received a total of 100 mg of IV propofol.  The patient was given 120 joules of electrical energy.  She converted to sinus rhythm for 3 or 4 beats and then returned to atrial fib.  She was then shocked again with 150 joules.  She returned to sinus rhythm and then her P-waves disappeared.  The rhythm remained regular.  It looks as if she has a junctional rhythm at this time.  Hemodynamically, she is stable.  PLAN: 1. 12-lead EKG to be done. 2. We will follow her rhythm and her status carefully over the next     few hours to decide if in fact she is stable to be able to go home     today or not.     Luis Abed, MD, [facility]     JDK/MEDQ  D:  09/29/2010  T:  09/29/2010  Job:  [id number]  Electronically Signed by Willa Rough MD FACC on 09/29/2010 05:46:34 PM

## 2010-10-06 ENCOUNTER — Encounter: Payer: Medicare PPO | Admitting: *Deleted

## 2010-10-06 ENCOUNTER — Ambulatory Visit (INDEPENDENT_AMBULATORY_CARE_PROVIDER_SITE_OTHER): Payer: Medicare PPO | Admitting: *Deleted

## 2010-10-06 DIAGNOSIS — I4891 Unspecified atrial fibrillation: Secondary | ICD-10-CM

## 2010-10-23 ENCOUNTER — Ambulatory Visit (INDEPENDENT_AMBULATORY_CARE_PROVIDER_SITE_OTHER): Payer: Medicare PPO | Admitting: Physician Assistant

## 2010-10-23 ENCOUNTER — Encounter: Payer: Self-pay | Admitting: Physician Assistant

## 2010-10-23 ENCOUNTER — Ambulatory Visit (INDEPENDENT_AMBULATORY_CARE_PROVIDER_SITE_OTHER): Payer: Medicare PPO | Admitting: *Deleted

## 2010-10-23 ENCOUNTER — Encounter: Payer: Medicare PPO | Admitting: Physician Assistant

## 2010-10-23 VITALS — BP 122/85 | HR 95 | Resp 16 | Ht 65.0 in | Wt 170.0 lb

## 2010-10-23 DIAGNOSIS — I4891 Unspecified atrial fibrillation: Secondary | ICD-10-CM

## 2010-10-23 DIAGNOSIS — I2510 Atherosclerotic heart disease of native coronary artery without angina pectoris: Secondary | ICD-10-CM

## 2010-10-23 DIAGNOSIS — I428 Other cardiomyopathies: Secondary | ICD-10-CM

## 2010-10-23 LAB — BASIC METABOLIC PANEL
CO2: 30 mEq/L (ref 19–32)
Chloride: 103 mEq/L (ref 96–112)
Glucose, Bld: 91 mg/dL (ref 70–99)
Potassium: 4.8 mEq/L (ref 3.5–5.1)
Sodium: 140 mEq/L (ref 135–145)

## 2010-10-23 LAB — POCT INR: INR: 2.5

## 2010-10-23 MED ORDER — METOPROLOL TARTRATE 50 MG PO TABS: 50.0000 mg | ORAL_TABLET | Freq: Two times a day (BID) | ORAL | Status: DC

## 2010-10-23 NOTE — Progress Notes (Signed)
History of Present Illness: Primary Cardiologist:  Dr. Charlton Haws  [name] is a 77 y.o. female who presents for post hospital follow up.  She developed recurrent atrial fibrillation and was set up for Tikosyn load.  She was admitted 6/5-6/8.  Her QTc remained stable.  Her INRs had been therapeutic for an adequate amount of time.  She underwent elective cardioversion restoring normal sinus rhythm.  Labs prior to discharge: Potassium 4.1, creatinine 0.65, magnesium 2.4, hemoglobin 13.1.  Presents today back in AFib.  Feels palpitations with this.  Feels more short of breath when out of rhythm.  Daughter states she stays busy however.  Has had some atypical chest pains in left upper chest and shoulder.  No exertional pain.  No angina.  No syncope.  Sleeps in recliner chronically.  No PND.  Has occasional edema.  Takes PRN lasix.    Past Medical History  Diagnosis Date  . DIABETES MELLITUS 09/24/2008  . HYPERLIPIDEMIA 09/24/2008  . HYPERTENSION 09/24/2008  . MI 09/24/2008  . CAD 09/24/2008    AMI in 1998 tx with PCI to LAD;  myoview 1/12:  no ischemia, EF 45%  . CARDIOMYOPATHY 09/24/2008    ischemic;  echo 4/12:  EF 20-25%, mild MR, mod LAE, mod reduced RVSF, mod RVE, mild RAE, mild TR, PASP 40  . PAROXYSMAL ATRIAL FIBRILLATION 09/24/2008    s/p DCCV x 4 in past;  Amiod d/c'd 2/2 abnormal PFTs;  Tikosyn load 6/12 with DCCV  . Edema 09/24/2008  . CAROTID BRUIT 09/24/2008  . Anemia   . GERD (gastroesophageal reflux disease)   . Blood transfusion     Current Outpatient Prescriptions  Medication Sig Dispense Refill  . aspirin 81 MG EC tablet Take 81 mg by mouth daily.        Marland Kitchen atorvastatin (LIPITOR) 10 MG tablet Take 10 mg by mouth daily.        . Calcium Carbonate-Vitamin D (CALCIUM 500 + D) 500-125 MG-UNIT TABS Take 1 tablet by mouth daily.        . Cholecalciferol (VITAMIN D-3 PO) Take by mouth daily.        Marland Kitchen dofetilide (TIKOSYN) 250 MCG capsule Take 250 mcg by mouth 2 (two) times daily.        .  furosemide (LASIX) 40 MG tablet Take 40 mg by mouth daily. As needed       . Multiple Vitamin (MULTIVITAMIN PO) Take 1 tablet by mouth daily.        . niacin (NIASPAN) 500 MG CR tablet Take 500 mg by mouth 2 (two) times daily.        . potassium chloride (MICRO-K) 10 MEQ CR capsule Take 10 mEq by mouth daily. As needed       . ramipril (ALTACE) 10 MG capsule Take 10 mg by mouth daily.        . vitamin E 100 UNIT capsule Take 100 Units by mouth daily.        Marland Kitchen warfarin (COUMADIN) 2.5 MG tablet Take by mouth as directed.        Marland Kitchen DISCONTD: metoprolol tartrate (LOPRESSOR) 25 MG tablet Take 25 mg by mouth 2 (two) times daily.        . metoprolol (LOPRESSOR) 50 MG tablet Take 1 tablet (50 mg total) by mouth 2 (two) times daily.  60 tablet  11    Allergies: Allergies  Allergen Reactions  . Simvastatin     REACTION: rash    Vital  Signs: BP 122/85  Pulse 95  Resp 16  Ht 5\' 5"  (1.651 m)  Wt 170 lb (77.111 kg)  BMI 28.29 kg/m2  PHYSICAL EXAM: Well nourished, well developed, in no acute distress HEENT: normal Neck: no JVD Cardiac:  normal S1, S2; irreg irreg rhythm; no significant murmur Lungs:  clear to auscultation bilaterally, no wheezing, rhonchi or rales Abd: soft, nontender, no hepatomegaly Ext: 1+ bilateral ankle edema Skin: warm and dry Neuro:  CNs 2-12 intact, no focal abnormalities noted  EKG:  Atrial fibrillation, HR 101, LAD, TW inversions in 1, aVL, V5-6, no significant change when compared to prior tracings.  ASSESSMENT AND PLAN:

## 2010-10-23 NOTE — Assessment & Plan Note (Signed)
She had some atypical chest pains recently.  No exertional symptoms.  Had a nonischemic myoview in January.  Continue ASA.  No further workup at this time.

## 2010-10-23 NOTE — Patient Instructions (Addendum)
Your physician recommends that you schedule a follow-up appointment in: PLEASE MAKE APPT TO SEE DR. ALLRED WITH IN THEN NEXT FEW WEEKS FOR A-FIB AS PER SCOTT WEAVER, PA-C.  Your physician recommends that you return for lab work in: TODAY BMET, MAGNESIUM 427.31  Your physician has recommended you make the following change in your medication: INCREASE LOPRESSOR 50 MG 1 TABLET TWICE DAILY, A NEW PRESCRIPTION WAS SENT IN FOR YOU TODAY TO [facility] ON [address].

## 2010-10-23 NOTE — Assessment & Plan Note (Signed)
She knows to take extra lasix if she gets more short of breath or develops worsening swelling.

## 2010-10-23 NOTE — Assessment & Plan Note (Signed)
Back in AFib.  Spoke to Dr. Eden Emms by phone.  Will keep on Tikosyn for now.  Refer to EP.  Will have her see Dr. Johney Frame in case she may be a candidate for AFib ablation.  I will increase her metoprolol to 50 mg BID for better rate control.  Check BMET and Mg2+ today.  Follow up with coumadin clinic as well.

## 2010-10-26 NOTE — Discharge Summary (Signed)
[name], [name] NO.:  [id number]  MEDICAL RECORD NO.:  [id number]  LOCATION:  2006                         FACILITY:  [facility]  PHYSICIAN:  Luis Abed, MD, FACCDATE OF BIRTH:  Apr 02, 1930  DATE OF ADMISSION:  09/26/2010 DATE OF DISCHARGE:  09/29/2010                              DISCHARGE SUMMARY   PRIMARY CARDIOLOGIST:  Theron Arista C. Eden Emms, MD, [facility]  PRIMARY CARE PROVIDER:  Ruthy Dick, MD  DISCHARGE DIAGNOSIS:  Recurrent atrial fibrillation. A.  Status post Tikosyn load and direct current cardioversion this admission. B.  On chronic anticoagulation with Coumadin. C.  Amiodarone use was discontinued in July 2011 secondary to reduction and diffusing capacity of lung for carbon monoxide. D.  Status post multiple cardioversions  SECONDARY DIAGNOSES: 1. Coronary artery disease, status post anterior myocardial infarction     in 1998 with percutaneous coronary intervention to the left     anterior descending. 2. Ischemic cardiomyopathy, ejection fraction 20-25% per     echocardiogram on August 16, 2010. 3. Hypertension. 4. Hyperlipidemia. 5. Diet-controlled diabetes mellitus. 6. Peripheral vascular disease with nonobstructive right internal     carotid artery stenosis.  ALLERGIES:  SIMVASTATIN and OXYCODONE.  PROCEDURES/DIAGNOSTICS PERFORMED DURING HOSPITALIZATION: 1. Direct current cardioversion on September 29, 2010.     a.     Return to normal sinus rhythm. 2. Chest x-ray on September 29, 2010:  No active lung disease.  Borderline     cardiomegaly.  REASON FOR HOSPITALIZATION:  This is a 77 year old female with history of recurrent atrial fibrillation with her last cardioversion being last year.  At this time, the patient was also discontinued from amiodarone secondary to reduction in DLCO.  The patient is stable over the past 6 months.  She feels that she has been in persistent atrial fibrillation. After followup appointment on July 25, 2010, with Dr.  Eden Emms, Tikosyn load was discussed to keep the patient in sinus rhythm.  She agreed to this and was scheduled Tikosyn on September 26, 2010.  The patient had subtherapeutic INRs on Sep 21, 2010, at 1.9, but on admission it was 2.3.  After discussion with Dr. Eden Emms, he recommended proceeding with Tikosyn load without TEE.  The patient was admitted to the telemetry unit.  With her creatinine clearance of 59.8, she was started on 250 mcg q.12 h.  Her baseline QTc was noted to be 429 in atrial fibrillation on the day of admission.  The patient's potassium was 4.5.  Her magnesium was 1.9.  The patient tolerated Tikosyn load well.  Her QTc remained between 370 and 409.  The patient was also therapeutic with her INRs while in the hospital.  With the patient's Tikosyn load, her INR is being therapeutic.  It was felt that we would proceed with direct current cardioversion as she remained in atrial fibrillation.  Risks, benefits, and indications were discussed with the patient, and she agreed to proceed.  On September 29, 2010, and Dr. Myrtis Ser performed successful biphasic cardioversion.  This did require 1 shock at 120 joules where she converted for 3-4 beats and returned to atrial fibrillation, but a second shock at 150 joules pushed the patient  into sinus rhythm.  It was felt that the patient had a junctional rhythm post cardioversion both stable.  The patient has been monitored on telemetry for the past 3-1/2 hours, and she is currently in normal sinus rhythm.  Her post cardioversion QTc showed EKG with sinus bradycardia and QTc of 409.  The patient remained stable.  On the day of discharge, Dr. Myrtis Ser evaluated the patient and noted her stable for home.  She was able to ambulate without difficulty.  She was remaining in sinus rhythm.  Arrangements for Tikosyn had been completed, and she will go home with a week's supply for her to pick up the remainder of supply at her pharmacy.  DISCHARGE LABORATORIES:   Sodium 137, potassium 4.1, chloride 101, bicarb 26, BUN 17, creatinine 0.65, INR 2.58, magnesium 2.4.  DISCHARGE MEDICATIONS: 1. Tikosyn 250 mcg 1 capsule twice daily. 2. Aspirin enteric-coated 81 mg 1 tablet daily. 3. Calcium carbonate over the counter 1 tablet every morning. 4. Coumadin 2.5 mg 1/2 to 1 tablet daily, 1/2 tablet, Sunday, Tuesday,     Thursday, and Saturday with 1 tablet the remainder of the days. 5. Lasix 40 mg 1 tablet daily as needed. 6. Lipitor 10 mg 1/2 tablet daily at bedtime. 7. Metoprolol tartrate 25 mg 1 tablet twice daily. 8. Multivitamin daily. 9. Niaspan 500 mg 1 tablet twice daily. 10.Ramipril 10 mg 1 tablet daily. 11.Stool softener OTC 2 tablets daily. 12.Vitamin B12 one tablet every morning. 13.Vitamin D3 1000 units tablets in the morning. 14.Vitamin E 400 mg 1 tablet daily.  FOLLOWUP PLANS AND INSTRUCTIONS: 1. The patient will follow up with Tereso Newcomer, physician assistant     for Dr. Eden Emms on October 23, 2010, at 10:30 a.m. 2. The patient will follow up in the Coumadin Clinic at [facility]     Cardiology on October 06, 2010, at 11:15 a.m. 3. The patient is to increase activity as tolerated. 4. The patient is to continue low-sodium heart-healthy diet. 5. The patient is to call the office in the interim for any problems     or concerns.  DURATION OF DISCHARGE:  Greater than 30 minutes physician and physician extender time.     Leonette Monarch, PA-C   ______________________________ Luis Abed, MD, [facility]    NB/MEDQ  D:  09/29/2010  T:  09/30/2010  Job:  [id number]  cc:   [name]. Eden Emms, MD, [facility] Ruthy Dick, MD  Electronically Signed by Alen Blew P.A. on 10/15/2010 04:56:38 PM Electronically Signed by Willa Rough MD FACC on 10/26/2010 11:57:55 AM

## 2010-10-26 NOTE — H&P (Signed)
[name], [name] NO.:  [id number]  MEDICAL RECORD NO.:  [id number]  LOCATION:  2006                         FACILITY:  [facility]  PHYSICIAN:  Bevelyn Buckles. Bensimhon, MDDATE OF BIRTH:  Apr 25, 1929  DATE OF ADMISSION:  09/26/2010 DATE OF DISCHARGE:                             HISTORY & PHYSICAL   PRIMARY CARDIOLOGIST:  Theron Arista C. Eden Emms, MD, [facility]  PRIMARY CARE PROVIDER:  Ruthy Dick, MD  PATIENT PROFILE:  A 77 year old female with prior history of CAD and paroxysmal fibrillation who has had recurrent atrial fibrillation for several months now and presents for Tikosyn loading.  PROBLEMS LIST: 1. Paroxysmal atrial fibrillation.     a.     Status post cardioversion in February 2006, May 2006, and      most recently June 2010.     b.     Chronic Coumadin therapy.     c.     The patient previously on amiodarone, but this was      discontinued on November 02, 2009, secondary to reduction in DLCO. 2. Coronary artery disease status post anterior MI in 1998, with PCI     to the LAD.     a.     Nonischemic Myoview January 2012, EF 45%. 3. Ischemic cardiomyopathy.     a.     EF 20-25% by echo August 16, 2010.  Echo also showed mild MR,      and moderately dilated RV with mild reduced RV function and mild      TR. 4. Hypertension . 5. Hyperlipidemia. 6. Diet-controlled diabetes. 7. Peripheral vascular disease with nonobstructive right internal     carotid artery stenosis. 8. History of left distal femur fracture status post surgical repair,     January 02 2007. 9. History of back pain. 10.Lumbar compression fracture. 11.Moderate secondary spinal stenosis affecting L4-5 and L5-S1.  ALLERGIES:  SIMVASTATIN and OXYCODONE.  HISTORY OF PRESENT ILLNESS:  A 77 year old Caucasian female with above problem list.  The patient has a history of atrial fibrillation previously cardioverted x3 and subsequently managed with amiodarone which was discontinued in July 2011,  secondary to reduction DLCO.  Over the past 6 months or more, she feels as though she is in AFib persistently.  She was last seen by Dr. Eden Emms on August 04, 2010, with recommendation for Tikosyn loading once patient's schedule was clear. While in atrial fibrillation, the patient experiences dyspnea on exertion as well as mild fatigue and weakness and ongoing irregular palpitations.  She has been followed in our Coumadin Clinic with an INR of 1.9, on Aug 28, 2010, 1.9 on Sep 21, 2010, and 2.3 today.  She presents today for Tikosyn loading.  HOME MEDICATIONS: 1. Aspirin 81 mg daily. 2. Lipitor 10 mg daily. 3. Calcium plus D 500-125 daily. 4. Lasix 40 mg daily p.r.n. 5. Lopressor 25 mg b.i.d. 6. Multivitamin daily. 7. Niacin 500 CR 2 tabs daily. 8. Potassium chloride 10 mEq p.r.n., taking Lasix. 9. Altace 10 mg daily. 10.Coumadin as directed. 11.Os-Cal 600 mg b.i.d.  FAMILY HISTORY:  Mother died of heart failure and diabetes at 39. Father died of stroke at 32.  She had a  sister who died with a brain tumor.  She has a brother alive with history of CAD and cancer, and another brother who is alive and well.  SOCIAL HISTORY:  The patient lives in [place] by herself.  She is retired.  She smoked over a period of 30-40 years on and off quit in the late 90s for good.  She denies alcohol and drug use, and is not routinely exercising.  REVIEW OF SYSTEMS:  Positive for dyspnea on exertion, palpitations, and occasional weakness related to atrial fibrillation.  She denies chest pain.  She is full code.  Otherwise all systems reviewed negative.  PHYSICAL EXAMINATION:  VITAL SIGNS:  The patient is afebrile, heart rate 110, respirations 16, blood pressure 155/82, pulse ox 95% on room air, weight is 77.2 kg. GENERAL:  Pleasant white female in no acute distress.  Awake, alert, and oriented x3 shows normal affect. HEENT:  Normal.  Nares grossly intact.  Nonfocal. SKIN:  Warm and dry without  lesions or masses. NECK:  Supple without bruits or JVD. LUNGS:  Respirations are unlabored, clear to auscultation. CARDIAC:  Irregularly irregular, S1 and S2.  No S3, S4, or murmurs. ABDOMEN:  Round, soft, nontender.  Bowel sounds present x4. EXTREMITIES:  Warm, dry, pink.  No clubbing, cyanosis, or edema. Dorsalis pedis, posterior tibial pulses 2+ bilaterally.  EKG shows atrial fibrillation rate 86, she has leftward axis.  QTc based on an average of 6 beats was 429.  Sodium 138, potassium 4.5, chloride 104, CO2 of 28, BUN 18, creatinine 0.8, glucose 71, INR today was 2.3, and as above with 1.9 on Sep 21, 2010, 1.9 on Aug 28, 2010, 2.6 on August 04, 2010, and 1.9 on July 19, 2010, magnesium is 1.9, calcium 8.9.  ASSESSMENT AND PLAN: 1. Atrial fibrillation.  The patient has been in persistent atrial     fibrillation for several months based on her symptoms.  She is     presenting today for elective Tikosyn loading.  We noted     subtherapeutic INR, both on Sep 21, 2010, and Aug 28, 2010, without     check in between.  INR is therapeutic.  Has been discussed with Dr.     Eden Emms, who recommends proceeding with Tikosyn loading without PE.     Based on a creatinine clearance of 59.8, we will start 250 mcg q.12     hours.  Her baseline QTc is 429 in atrial fibrillation tonight.     Continue Coumadin per Pharmacy.  The patient does not convert with     Tikosyn loading.  We will likely plan cardioversion later this     week. 2. Coronary artery disease.  The patient denies any recent chest pain.     She does have a history of dyspnea in the setting of atrial     fibrillation with nonischemic Myoview earlier this year.  We will     continue her home dose of aspirin 81, statin, and beta-blocker. 3. Probable mixed ischemic and nonischemic cardiomyopathy, hard to no     what really atrial fibrillation is playing with her newly reduced     EF by echo earlier this year.  She is euvolemic on exam.   Continue     beta-blocker, ACE inhibitor, and consider switching labetalol to     Toprol-XL or Coreg.  Consider initiation of spirolactone. 4. For hypertension, continue home meds. 5. For hyperlipidemia, continue statin therapy.     Nicolasa Ducking, ANP  ______________________________ Bevelyn Buckles. Bensimhon, MD    CB/MEDQ  D:  09/26/2010  T:  09/27/2010  Job:  [id number]  Electronically Signed by Nicolasa Ducking ANP on 10/04/2010 04:24:46 PM Electronically Signed by Arvilla Meres MD on 10/26/2010 03:23:48 PM

## 2010-11-02 ENCOUNTER — Encounter: Payer: Self-pay | Admitting: *Deleted

## 2010-11-02 DIAGNOSIS — M8580 Other specified disorders of bone density and structure, unspecified site: Secondary | ICD-10-CM | POA: Insufficient documentation

## 2010-11-06 ENCOUNTER — Encounter: Payer: Self-pay | Admitting: Family Medicine

## 2010-11-06 ENCOUNTER — Ambulatory Visit (INDEPENDENT_AMBULATORY_CARE_PROVIDER_SITE_OTHER): Payer: Medicare PPO | Admitting: Family Medicine

## 2010-11-06 DIAGNOSIS — E119 Type 2 diabetes mellitus without complications: Secondary | ICD-10-CM

## 2010-11-06 DIAGNOSIS — E785 Hyperlipidemia, unspecified: Secondary | ICD-10-CM

## 2010-11-06 DIAGNOSIS — I10 Essential (primary) hypertension: Secondary | ICD-10-CM

## 2010-11-06 DIAGNOSIS — I4891 Unspecified atrial fibrillation: Secondary | ICD-10-CM

## 2010-11-06 MED ORDER — GLUCOSE BLOOD VI STRP: ORAL_STRIP | Status: AC

## 2010-11-06 MED ORDER — GABAPENTIN 100 MG PO CAPS: 100.0000 mg | ORAL_CAPSULE | Freq: Three times a day (TID) | ORAL | Status: DC

## 2010-11-06 MED ORDER — ACCU-CHEK ACTIVE CARE KIT KIT: PACK | Status: DC

## 2010-11-06 MED ORDER — ACCU-CHEK MULTICLIX LANCETS MISC: Status: AC

## 2010-11-06 NOTE — Progress Notes (Signed)
Subjective:    Patient ID: [name], female    DOB: [birth year], 77 y.o.   MRN: [id number]  HPI Patient seen for medical followup. History of CAD, intermittent atrial fibrillation, type 2 diabetes, hyperlipidemia, hypertension, and osteopenia. Chronic Coumadin followed through Coumadin clinic. No recent bleeding complications. Per daughter and patient she appears to be in and out of atrial fibrillation. Takes Tikosyn twice daily. Denies recent chest pains or dizziness. No dyspnea. Recent basic metabolic panel normal.  History of type 2 diabetes. Previously on Actos but taken off with good control. Recently malfunctioning monitor. Not checking blood sugars at home. No symptoms of hyperglycemia.  Patient has history of shingles. Neuropathy pains left lumbar area. Uses gabapentin intermittently which seems to provide adequate relief. She is on very low dosage. No side effects. Requesting refills  Past Medical History  Diagnosis Date  . DIABETES MELLITUS 09/24/2008  . HYPERLIPIDEMIA 09/24/2008  . HYPERTENSION 09/24/2008  . MI 09/24/2008  . CAD 09/24/2008    AMI in 1998 tx with PCI to LAD;  myoview 1/12:  no ischemia, EF 45%  . CARDIOMYOPATHY 09/24/2008    ischemic;  echo 4/12:  EF 20-25%, mild MR, mod LAE, mod reduced RVSF, mod RVE, mild RAE, mild TR, PASP 40  . PAROXYSMAL ATRIAL FIBRILLATION 09/24/2008    s/p DCCV x 4 in past;  Amiod d/c'd 2/2 abnormal PFTs;  Tikosyn load 6/12 with DCCV  . Edema 09/24/2008  . CAROTID BRUIT 09/24/2008  . Anemia   . GERD (gastroesophageal reflux disease)   . Blood transfusion   . Osteopenia    Past Surgical History  Procedure Date  . Fracture surgery 2006    hip  . Knee surgery 2008    broken knee    reports that she quit smoking about 14 years ago. Her smoking use included Cigarettes. She has a 10 pack-year smoking history. She does not have any smokeless tobacco history on file. Her alcohol and drug histories not on file. family history includes Alcohol abuse in  her brother and father; Cancer in her mother and sister; Coronary artery disease in an unspecified family member; Diabetes in her mother; Heart disease in her mother; and Hypertension in an unspecified family member. Allergies  Allergen Reactions  . Simvastatin     REACTION: rash        Review of Systems  Constitutional: Negative for fever, chills and fatigue.  Respiratory: Negative for cough, choking and shortness of breath.   Cardiovascular: Negative for chest pain, palpitations and leg swelling.  Gastrointestinal: Negative for abdominal pain, blood in stool and abdominal distention.  Genitourinary: Negative for dysuria.       Objective:   Physical Exam  Constitutional: She is oriented to person, place, and time. She appears well-developed and well-nourished. No distress.  Cardiovascular: Normal rate, regular rhythm and normal heart sounds.  Exam reveals no gallop.   Pulmonary/Chest: Effort normal and breath sounds normal. No respiratory distress. She has no wheezes. She has no rales.  Musculoskeletal:       No pitting edema  Neurological: She is alert and oriented to person, place, and time.  Psychiatric: She has a normal mood and affect. Her behavior is normal.          Assessment & Plan:  #1 intermittent atrial fibrillation. Followup with cardiology scheduled. Patient remains on Coumadin. Appears to be in  normal sinus rhythm today #2 type 2 diabetes. Diet controlled. Reassess A1c at followup in 4 months. New glucose  monitor given along with test strips #3 hypertension stable. Continue current medications #4 hyperlipidemia.  Labs prior to transferring care here a few months ago. Recheck lipids at followup #5 postherpetic neuralgia. Refilled gabapentin

## 2010-11-17 ENCOUNTER — Encounter: Payer: Self-pay | Admitting: Internal Medicine

## 2010-11-23 ENCOUNTER — Encounter: Payer: Self-pay | Admitting: Internal Medicine

## 2010-11-23 ENCOUNTER — Ambulatory Visit (INDEPENDENT_AMBULATORY_CARE_PROVIDER_SITE_OTHER): Payer: Medicare PPO | Admitting: *Deleted

## 2010-11-23 ENCOUNTER — Ambulatory Visit (INDEPENDENT_AMBULATORY_CARE_PROVIDER_SITE_OTHER): Payer: Medicare PPO | Admitting: Internal Medicine

## 2010-11-23 VITALS — BP 123/59 | HR 49 | Ht 64.0 in | Wt 171.0 lb

## 2010-11-23 DIAGNOSIS — I10 Essential (primary) hypertension: Secondary | ICD-10-CM

## 2010-11-23 DIAGNOSIS — I4891 Unspecified atrial fibrillation: Secondary | ICD-10-CM

## 2010-11-23 DIAGNOSIS — I428 Other cardiomyopathies: Secondary | ICD-10-CM

## 2010-11-23 LAB — BASIC METABOLIC PANEL
CO2: 27 mEq/L (ref 19–32)
Calcium: 9 mg/dL (ref 8.4–10.5)
Chloride: 102 mEq/L (ref 96–112)
Potassium: 4.6 mEq/L (ref 3.5–5.1)
Sodium: 140 mEq/L (ref 135–145)

## 2010-11-23 LAB — POCT INR: INR: 2.5

## 2010-11-23 NOTE — Assessment & Plan Note (Signed)
Stable, NYHA Class II CHF Given advanced age, I would not recommend ICD implantation in this patient. Continue medical therapy per Dr Eden Emms.

## 2010-11-23 NOTE — Progress Notes (Signed)
[name] is a pleasant 77 y.o. WF patient with a h/o persistent atrial fibrillation, CAD, and ischemic CM who presents today for EP consultation regarding her atrial fibrillation.  She has previously required multiple cardioversions with her afib.  She has moderate atrial enlargement as well as an ischemic CM.  She has failed medical therapy with amiodarone due to increased PFTs.  Most recently she was admitted to [facility] 6/12 for tikosyn loading.  She was cardioverted by Dr Myrtis Ser.  Upon follow-up with Tereso Newcomer 10/23/10, she had returned to afib.  She is therefore referred to our office. She reports doing reasonably well since that time.  She has converted to sinus rhythm and feels that she has maintained sinus rhythm for several weeks.  During afib, she typically notices palpitations and fatigue.  Despite afib, she maintains an active lifestyle.  She does not feel that her afib significant reduces her quality of life.  She is appropriately anticoagulated with coumadin.  Today, she denies symptoms of chest pain, shortness of breath, orthopnea, PND, lower extremity edema, dizziness, presyncope, syncope, or neurologic sequela. The patient is tolerating medications without difficulties and is otherwise without complaint today.   Past Medical History  Diagnosis Date  . DIABETES MELLITUS 09/24/2008  . HYPERLIPIDEMIA 09/24/2008  . HYPERTENSION 09/24/2008  . MI 09/24/2008  . CAD 09/24/2008    AMI in 1998 tx with PCI to LAD;  myoview 1/12:  no ischemia, EF 45%  . CARDIOMYOPATHY 09/24/2008    ischemic;  echo 4/12:  EF 20-25%, mild MR, mod LAE, mod reduced RVSF, mod RVE, mild RAE, mild TR, PASP 40  . Persistent atrial fibrillation 09/24/2008    s/p DCCV x 4 in past;  Amiod d/c'd 2/2 abnormal PFTs;  Tikosyn load 6/12 with DCCV  . Edema 09/24/2008  . CAROTID BRUIT 09/24/2008  . Anemia   . GERD (gastroesophageal reflux disease)   . Osteopenia    Past Surgical History  Procedure Date  . Fracture surgery 2006   hip  . Knee surgery 2008    broken knee    Current Outpatient Prescriptions  Medication Sig Dispense Refill  . aspirin 81 MG EC tablet Take 81 mg by mouth daily.        Marland Kitchen atorvastatin (LIPITOR) 10 MG tablet Take 10 mg by mouth daily.        . Blood Glucose Monitoring Suppl (ACCU-CHEK ACTIVE CARE KIT) KIT Disp 11/06/10  1 each  0  . Calcium Carbonate-Vitamin D (CALCIUM 500 + D) 500-125 MG-UNIT TABS Take 1 tablet by mouth daily.        . Cholecalciferol (VITAMIN D-3 PO) Take by mouth daily.        Marland Kitchen dofetilide (TIKOSYN) 250 MCG capsule Take 250 mcg by mouth 2 (two) times daily.        . furosemide (LASIX) 40 MG tablet Take 40 mg by mouth daily. As needed       . gabapentin (NEURONTIN) 100 MG capsule Take 100 mg by mouth as needed.        Marland Kitchen glucose blood (ACCU-CHEK AVIVA) test strip Use as directed  100 each  5  . Lancets (ACCU-CHEK MULTICLIX) lancets Use as directed  100 each  5  . metoprolol (LOPRESSOR) 50 MG tablet Take 1 tablet (50 mg total) by mouth 2 (two) times daily.  60 tablet  11  . Multiple Vitamin (MULTIVITAMIN PO) Take 1 tablet by mouth daily.        Marland Kitchen  niacin (NIASPAN) 500 MG CR tablet Take 500 mg by mouth 2 (two) times daily.        . potassium chloride (MICRO-K) 10 MEQ CR capsule Take 10 mEq by mouth daily. As needed       . ramipril (ALTACE) 10 MG capsule Take 10 mg by mouth daily.        . vitamin E 100 UNIT capsule Take 100 Units by mouth daily.        Marland Kitchen warfarin (COUMADIN) 2.5 MG tablet Take by mouth as directed.          Allergies  Allergen Reactions  . Simvastatin     REACTION: rash    History   Social History  . Marital Status: Widowed    Spouse Name: N/A    Number of Children: N/A  . Years of Education: N/A   Occupational History  . Not on file.   Social History Main Topics  . Smoking status: Former Smoker -- 0.5 packs/day for 20 years    Types: Cigarettes    Quit date: 07/11/1996  . Smokeless tobacco: Not on file  . Alcohol Use: No  . Drug Use: No    . Sexually Active: Not on file   Other Topics Concern  . Not on file   Social History Narrative  . No narrative on file    Family History  Problem Relation Age of Onset  . Cancer Mother     breast  . Heart disease Mother   . Diabetes Mother   . Alcohol abuse Father   . Cancer Sister     breast  . Alcohol abuse Brother   . Coronary artery disease    . Hypertension      ROS- All systems are reviewed and negative except as per the HPI above  Physical Exam: Filed Vitals:   11/23/10 0901  BP: 123/59  Pulse: 49  Height: 5\' 4"  (1.626 m)  Weight: 171 lb (77.565 kg)    GEN- The patient is elderly appearing, alert and oriented x 3 today.   Head- normocephalic, atraumatic Eyes-  Sclera clear, conjunctiva pink Ears- hearing intact Oropharynx- clear Neck- supple, no JVP Lymph- no cervical lymphadenopathy Lungs- Clear to ausculation bilaterally, normal work of breathing Heart- Regular rate and rhythm, no murmurs, rubs or gallops,  GI- soft, NT, ND, + BS Extremities- no clubbing, cyanosis, 1+ pedal edema MS- no significant deformity or atrophy Skin- no rash or lesion Psych- euthymic mood, full affect Neuro- strength and sensation are intact  EKG today reveals sinus bradycardia 49 bpm, lvh, QTc 448  Assessment and Plan:

## 2010-11-23 NOTE — Assessment & Plan Note (Signed)
Stable No change required today  

## 2010-11-23 NOTE — Patient Instructions (Addendum)
Your physician recommends that you schedule a follow-up appointment in: with Dr Eden Emms in 2 months and see Dr Johney Frame as needed   Your physician recommends that you return for lab work in: today  BMP/MAG

## 2010-11-23 NOTE — Assessment & Plan Note (Signed)
Presently in sinus rhythm with tikosyn QTc is stable.  She is mildly bradycardic with metoprolol but asymptomatic. We will check BMET and Mg today. Therapeutic strategies for afib including medicine and ablation were discussed in detail with the patient today. Risk, benefits, and alternatives to EP study and radiofrequency ablation for afib were also discussed in detail today. She is very clear that she wishes to avoid ablation.  Given her advanced age, I think that this is reasonable.  She has failed medical therapy with amiodarone.  Other than her present tikosyn, I do not feel that she is a candidate for other AAD. She has mild symptoms with her afib.  I would therefore recommend that we continue tikosyn at this time.  IF her afib progresses, she may require a rate control strategy longterm.

## 2010-12-22 ENCOUNTER — Ambulatory Visit (INDEPENDENT_AMBULATORY_CARE_PROVIDER_SITE_OTHER): Payer: Medicare PPO | Admitting: *Deleted

## 2010-12-22 DIAGNOSIS — I4891 Unspecified atrial fibrillation: Secondary | ICD-10-CM

## 2010-12-22 LAB — POCT INR: INR: 2.2

## 2011-01-19 ENCOUNTER — Encounter: Payer: Medicare PPO | Admitting: *Deleted

## 2011-01-25 ENCOUNTER — Ambulatory Visit: Payer: Medicare PPO | Admitting: Cardiovascular Disease

## 2011-01-26 ENCOUNTER — Encounter: Payer: Self-pay | Admitting: Cardiovascular Disease

## 2011-01-26 ENCOUNTER — Ambulatory Visit (INDEPENDENT_AMBULATORY_CARE_PROVIDER_SITE_OTHER): Payer: Medicare PPO | Admitting: Cardiovascular Disease

## 2011-01-26 ENCOUNTER — Ambulatory Visit (INDEPENDENT_AMBULATORY_CARE_PROVIDER_SITE_OTHER): Payer: Medicare PPO | Admitting: *Deleted

## 2011-01-26 VITALS — BP 134/77 | HR 55 | Ht 65.0 in | Wt 175.0 lb

## 2011-01-26 DIAGNOSIS — I10 Essential (primary) hypertension: Secondary | ICD-10-CM

## 2011-01-26 DIAGNOSIS — I4891 Unspecified atrial fibrillation: Secondary | ICD-10-CM

## 2011-01-26 DIAGNOSIS — I2510 Atherosclerotic heart disease of native coronary artery without angina pectoris: Secondary | ICD-10-CM

## 2011-01-26 DIAGNOSIS — E7800 Pure hypercholesterolemia, unspecified: Secondary | ICD-10-CM

## 2011-01-26 LAB — POCT INR: INR: 2.1

## 2011-01-26 NOTE — Patient Instructions (Signed)
Your physician wants you to follow-up in: 6 MONTHS You will receive a reminder letter in the mail two months in advance. If you don't receive a letter, please call our office to schedule the follow-up appointment. 

## 2011-01-26 NOTE — Assessment & Plan Note (Signed)
PAF maint NSR on tikosyn.  QT/QRS ok today  Symptoms improved

## 2011-01-26 NOTE — Assessment & Plan Note (Signed)
Stable with no angina and good activity level.  Continue medical Rx  

## 2011-01-26 NOTE — Assessment & Plan Note (Signed)
Well controlled.  Continue current medications and low sodium Dash type diet.    

## 2011-01-26 NOTE — Progress Notes (Signed)
[name] is a pleasant 77 y.o. WF patient with a h/o persistent atrial fibrillation, CAD, and ischemic CM who presents today for EP consultation regarding her atrial fibrillation. She has previously required multiple cardioversions with her afib. She has moderate atrial enlargement as well as an ischemic CM. She has failed medical therapy with amiodarone due to increased PFTs. Most recently she was admitted to [facility] 6/12 for tikosyn loading. She was cardioverted by Dr Myrtis Ser. Upon follow-up with Tereso Newcomer 10/23/10, she had returned to afib. She is therefore referred to our office.  She reports doing reasonably well since that time. She has converted to sinus rhythm and feels that she has maintained sinus rhythm for several weeks. During afib, she typically notices palpitations and fatigue. Despite afib, she maintains an active lifestyle. She does not feel that her afib significant reduces her quality of life. She is appropriately anticoagulated with coumadin.  Today, she denies symptoms of chest pain, shortness of breath, orthopnea, PND, lower extremity edema, dizziness, presyncope, syncope, or neurologic sequela. The patient is tolerating medications without difficulties and is otherwise without complaint today.   ROS: Denies fever, malais, weight loss, blurry vision, decreased visual acuity, cough, sputum, SOB, hemoptysis, pleuritic pain, palpitaitons, heartburn, abdominal pain, melena, lower extremity edema, claudication, or rash.  All other systems reviewed and negative  General: Affect appropriate Healthy:  appears stated age HEENT: normal Neck supple with no adenopathy JVP normal no bruits no thyromegaly Lungs clear with no wheezing and good diaphragmatic motion Heart:  S1/S2 no murmur,rub, gallop or click PMI normal Abdomen: benighn, BS positve, no tenderness, no AAA no bruit.  No HSM or HJR Distal pulses intact with no bruits No edema Neuro non-focal Skin warm and dry No  muscular weakness   Current Outpatient Prescriptions  Medication Sig Dispense Refill  . aspirin 81 MG EC tablet Take 81 mg by mouth daily.        Marland Kitchen atorvastatin (LIPITOR) 10 MG tablet Take 10 mg by mouth daily.        . Blood Glucose Monitoring Suppl (ACCU-CHEK ACTIVE CARE KIT) KIT Disp 11/06/10  1 each  0  . Calcium Carbonate-Vitamin D (CALCIUM 500 + D) 500-125 MG-UNIT TABS Take 1 tablet by mouth daily.        . Cholecalciferol (VITAMIN D-3 PO) Take by mouth daily.        Marland Kitchen dofetilide (TIKOSYN) 250 MCG capsule Take 250 mcg by mouth 2 (two) times daily.        . furosemide (LASIX) 40 MG tablet Take 40 mg by mouth daily. As needed       . gabapentin (NEURONTIN) 100 MG capsule Take 100 mg by mouth as needed.        Marland Kitchen glucose blood (ACCU-CHEK AVIVA) test strip Use as directed  100 each  5  . Lancets (ACCU-CHEK MULTICLIX) lancets Use as directed  100 each  5  . metoprolol (LOPRESSOR) 50 MG tablet Take 1 tablet (50 mg total) by mouth 2 (two) times daily.  60 tablet  11  . Multiple Vitamin (MULTIVITAMIN PO) Take 1 tablet by mouth daily.        . niacin (NIASPAN) 500 MG CR tablet Take 500 mg by mouth 2 (two) times daily.        . potassium chloride (MICRO-K) 10 MEQ CR capsule Take 10 mEq by mouth daily. As needed       . ramipril (ALTACE) 10 MG capsule Take 10 mg by mouth  daily.        . vitamin E 100 UNIT capsule Take 100 Units by mouth daily.        Marland Kitchen warfarin (COUMADIN) 2.5 MG tablet Take by mouth as directed.          Allergies  Simvastatin  Electrocardiogram:  Sinus bradycardia 55 nonspecific ST/T wave changes QT 448 QRS 92 msec  Assessment and Plan

## 2011-01-26 NOTE — Assessment & Plan Note (Signed)
Cholesterol is at goal.  Continue current dose of statin and diet Rx.  No myalgias or side effects.  F/U  LFT's in 6 months. Lab Results  Component Value Date   LDLCALC  Value: 72        Total Cholesterol/HDL:CHD Risk Coronary Heart Disease Risk Table                     Men   Women  1/2 Average Risk   3.4   3.3  Average Risk       5.0   4.4  2 X Average Risk   9.6   7.1  3 X Average Risk  23.4   11.0        Use the calculated Patient Ratio above and the CHD Risk Table to determine the patient's CHD Risk.        ATP III CLASSIFICATION (LDL):  <100     mg/dL   Optimal  147-829  mg/dL   Near or Above                    Optimal  130-159  mg/dL   Borderline  562-130  mg/dL   High  >865     mg/dL   Very High 7/84/6962

## 2011-02-01 LAB — URINALYSIS, ROUTINE W REFLEX MICROSCOPIC
Ketones, ur: NEGATIVE
Nitrite: NEGATIVE
Urobilinogen, UA: 1

## 2011-02-01 LAB — URINE CULTURE: Special Requests: POSITIVE

## 2011-02-01 LAB — CBC
Hemoglobin: 10.9 — ABNORMAL LOW
RDW: 14.6 — ABNORMAL HIGH
WBC: 10.4

## 2011-02-01 LAB — COMPREHENSIVE METABOLIC PANEL
ALT: 73 — ABNORMAL HIGH
Albumin: 1.8 — ABNORMAL LOW
Alkaline Phosphatase: 81
Chloride: 101
Glucose, Bld: 122 — ABNORMAL HIGH
Potassium: 3.9
Sodium: 138
Total Protein: 4.3 — ABNORMAL LOW

## 2011-02-01 LAB — URINE MICROSCOPIC-ADD ON

## 2011-02-01 LAB — PROTIME-INR
Prothrombin Time: 18.5 — ABNORMAL HIGH
Prothrombin Time: 27.3 — ABNORMAL HIGH

## 2011-02-02 LAB — CROSSMATCH: ABO/RH(D): O POS

## 2011-02-02 LAB — PROTIME-INR
INR: 1.2
INR: 1.3
INR: 1.5
Prothrombin Time: 15.7 — ABNORMAL HIGH
Prothrombin Time: 16.7 — ABNORMAL HIGH
Prothrombin Time: 23 — ABNORMAL HIGH

## 2011-02-02 LAB — DIFFERENTIAL
Basophils Absolute: 0
Basophils Absolute: 0
Basophils Relative: 0
Eosinophils Absolute: 0
Eosinophils Relative: 1
Lymphocytes Relative: 22
Monocytes Absolute: 0.8 — ABNORMAL HIGH
Neutro Abs: 4.3
Neutrophils Relative %: 64

## 2011-02-02 LAB — CBC
HCT: 23.5 — ABNORMAL LOW
HCT: 26.6 — ABNORMAL LOW
HCT: 31.9 — ABNORMAL LOW
HCT: 36.2
Hemoglobin: 10 — ABNORMAL LOW
Hemoglobin: 8.5 — ABNORMAL LOW
Hemoglobin: 9.1 — ABNORMAL LOW
MCHC: 34
MCHC: 34
MCHC: 34.5
MCHC: 34.5
MCV: 91.5
MCV: 92.6
MCV: 94.6
Platelets: 224
Platelets: 229
Platelets: 255
RBC: 2.53 — ABNORMAL LOW
RBC: 2.63 — ABNORMAL LOW
RBC: 3.18 — ABNORMAL LOW
RDW: 13
RDW: 13.5
RDW: 14.6 — ABNORMAL HIGH
RDW: 14.7 — ABNORMAL HIGH
RDW: 14.8 — ABNORMAL HIGH
WBC: 7.1

## 2011-02-02 LAB — URINALYSIS, ROUTINE W REFLEX MICROSCOPIC
Bilirubin Urine: NEGATIVE
Glucose, UA: NEGATIVE
Glucose, UA: NEGATIVE
Hgb urine dipstick: NEGATIVE
Ketones, ur: 15 — AB
Nitrite: NEGATIVE
Protein, ur: NEGATIVE
Specific Gravity, Urine: 1.022
Urobilinogen, UA: 1
pH: 5

## 2011-02-02 LAB — URINE MICROSCOPIC-ADD ON

## 2011-02-02 LAB — I-STAT 8, (EC8 V) (CONVERTED LAB)
Acid-Base Excess: 3 — ABNORMAL HIGH
BUN: 18
Bicarbonate: 25.6 — ABNORMAL HIGH
Chloride: 103
HCT: 39
Hemoglobin: 13.3
Operator id: 294511
Sodium: 138

## 2011-02-02 LAB — BASIC METABOLIC PANEL
BUN: 7
BUN: 8
CO2: 29
CO2: 31
CO2: 32
Calcium: 7.3 — ABNORMAL LOW
Calcium: 7.8 — ABNORMAL LOW
Chloride: 102
Chloride: 103
Chloride: 103
Creatinine, Ser: 0.51
Creatinine, Ser: 0.76
GFR calc non Af Amer: >60
Glucose, Bld: 116 — ABNORMAL HIGH
Glucose, Bld: 119 — ABNORMAL HIGH
Glucose, Bld: 125 — ABNORMAL HIGH
Potassium: 3.6
Potassium: 3.7
Sodium: 136
Sodium: 138

## 2011-02-02 LAB — APTT: aPTT: 38 — ABNORMAL HIGH

## 2011-02-02 LAB — SAMPLE TO BLOOD BANK

## 2011-02-02 LAB — URINE CULTURE: Colony Count: >=100000

## 2011-02-02 LAB — POCT I-STAT CREATININE: Creatinine, Ser: 0.8

## 2011-02-02 LAB — ABO/RH: ABO/RH(D): O POS

## 2011-02-13 ENCOUNTER — Telehealth: Payer: Self-pay | Admitting: Cardiology

## 2011-02-13 NOTE — Telephone Encounter (Signed)
Phar wants to know if Tikosyn can be substituted for something cheaper?  Please call phar back and advise.

## 2011-02-13 NOTE — Telephone Encounter (Signed)
Pt is in the donut hole and is unable to afford the Tikosyn.  She wants to know if there is a less expensive medication she can substitute with.

## 2011-02-14 NOTE — Telephone Encounter (Signed)
This is Dr Fabio Bering pt . She saw Dr Eden Emms 01/26/11. I will forward to Dr Eden Emms Victorio Palm.

## 2011-02-14 NOTE — Telephone Encounter (Signed)
Spoke with pt, will fill out paperwork for assistance from the company for tikosyn. [name]

## 2011-02-23 ENCOUNTER — Ambulatory Visit (INDEPENDENT_AMBULATORY_CARE_PROVIDER_SITE_OTHER): Payer: Medicare PPO | Admitting: *Deleted

## 2011-02-23 DIAGNOSIS — Z7901 Long term (current) use of anticoagulants: Secondary | ICD-10-CM

## 2011-02-23 DIAGNOSIS — I4891 Unspecified atrial fibrillation: Secondary | ICD-10-CM

## 2011-03-07 ENCOUNTER — Ambulatory Visit: Payer: Medicare PPO | Admitting: Family Medicine

## 2011-03-19 ENCOUNTER — Ambulatory Visit (INDEPENDENT_AMBULATORY_CARE_PROVIDER_SITE_OTHER): Payer: Medicare PPO | Admitting: Family Medicine

## 2011-03-19 ENCOUNTER — Encounter: Payer: Self-pay | Admitting: Family Medicine

## 2011-03-19 DIAGNOSIS — I10 Essential (primary) hypertension: Secondary | ICD-10-CM

## 2011-03-19 DIAGNOSIS — E7800 Pure hypercholesterolemia, unspecified: Secondary | ICD-10-CM

## 2011-03-19 DIAGNOSIS — H6120 Impacted cerumen, unspecified ear: Secondary | ICD-10-CM

## 2011-03-19 DIAGNOSIS — E119 Type 2 diabetes mellitus without complications: Secondary | ICD-10-CM

## 2011-03-19 DIAGNOSIS — I4891 Unspecified atrial fibrillation: Secondary | ICD-10-CM

## 2011-03-19 LAB — HEPATIC FUNCTION PANEL
ALT: 14 U/L (ref 0–35)
AST: 18 U/L (ref 0–37)
Albumin: 3.7 g/dL (ref 3.5–5.2)
Total Bilirubin: 1 mg/dL (ref 0.3–1.2)
Total Protein: 6.6 g/dL (ref 6.0–8.3)

## 2011-03-19 LAB — HEMOGLOBIN A1C: Hgb A1c MFr Bld: 5.9 % (ref 4.6–6.5)

## 2011-03-19 LAB — BASIC METABOLIC PANEL
BUN: 17 mg/dL (ref 6–23)
CO2: 27 mEq/L (ref 19–32)
Chloride: 106 mEq/L (ref 96–112)
Creatinine, Ser: 0.7 mg/dL (ref 0.4–1.2)
Glucose, Bld: 91 mg/dL (ref 70–99)
Potassium: 4 mEq/L (ref 3.5–5.1)

## 2011-03-19 LAB — LIPID PANEL
Cholesterol: 149 mg/dL (ref 0–200)
VLDL: 21 mg/dL (ref 0.0–40.0)
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
## 2011-03-21 NOTE — Progress Notes (Signed)
Quick Note:  Pt informed ______ 

## 2011-04-06 ENCOUNTER — Ambulatory Visit (INDEPENDENT_AMBULATORY_CARE_PROVIDER_SITE_OTHER): Payer: Medicare PPO | Admitting: *Deleted

## 2011-04-06 DIAGNOSIS — Z7901 Long term (current) use of anticoagulants: Secondary | ICD-10-CM

## 2011-04-06 DIAGNOSIS — I4891 Unspecified atrial fibrillation: Secondary | ICD-10-CM

## 2011-04-06 LAB — POCT INR: INR: 2.5

## 2011-05-17 ENCOUNTER — Other Ambulatory Visit: Payer: Self-pay | Admitting: Physician Assistant

## 2011-05-18 ENCOUNTER — Ambulatory Visit (INDEPENDENT_AMBULATORY_CARE_PROVIDER_SITE_OTHER): Payer: Medicare PPO | Admitting: *Deleted

## 2011-05-18 ENCOUNTER — Other Ambulatory Visit: Payer: Self-pay | Admitting: *Deleted

## 2011-05-18 ENCOUNTER — Encounter: Payer: Self-pay | Admitting: *Deleted

## 2011-05-18 DIAGNOSIS — Z7901 Long term (current) use of anticoagulants: Secondary | ICD-10-CM

## 2011-05-18 DIAGNOSIS — I4891 Unspecified atrial fibrillation: Secondary | ICD-10-CM

## 2011-05-18 LAB — POCT INR: INR: 2.1

## 2011-05-25 ENCOUNTER — Telehealth: Payer: Self-pay | Admitting: Physician Assistant
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
## 2011-05-28 ENCOUNTER — Telehealth: Payer: Self-pay | Admitting: Cardiovascular Disease

## 2011-05-28 ENCOUNTER — Telehealth: Payer: Self-pay

## 2011-05-28 MED ORDER — DOFETILIDE 250 MCG PO CAPS: 250.0000 ug | ORAL_CAPSULE | Freq: Two times a day (BID) | ORAL | Status: DC

## 2011-05-28 NOTE — Telephone Encounter (Signed)
Pt has questions her tykosin because she lost her prescription and she only has a pill for today

## 2011-05-28 NOTE — Telephone Encounter (Signed)
Patient states that Susy Manor RN from the coumadin clinic, send a prescription  For [facility]osyn [facility]. issue is resolved.

## 2011-05-28 NOTE — Telephone Encounter (Signed)
Attempted to call pt x 3 , phone tone sounds busy, will try to call patient again.
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
## 2011-06-29 ENCOUNTER — Ambulatory Visit (INDEPENDENT_AMBULATORY_CARE_PROVIDER_SITE_OTHER): Payer: Medicare PPO

## 2011-06-29 DIAGNOSIS — Z7901 Long term (current) use of anticoagulants: Secondary | ICD-10-CM

## 2011-06-29 DIAGNOSIS — I4891 Unspecified atrial fibrillation: Secondary | ICD-10-CM

## 2011-06-29 LAB — POCT INR: INR: 1.9

## 2011-07-05 ENCOUNTER — Other Ambulatory Visit: Payer: Self-pay | Admitting: *Deleted

## 2011-07-05 MED ORDER — RAMIPRIL 10 MG PO CAPS: 10.0000 mg | ORAL_CAPSULE | Freq: Every day | ORAL | Status: DC

## 2011-07-11 ENCOUNTER — Other Ambulatory Visit: Payer: Self-pay

## 2011-07-11 MED ORDER — NIACIN ER (ANTIHYPERLIPIDEMIC) 500 MG PO TBCR: 500.0000 mg | EXTENDED_RELEASE_TABLET | Freq: Two times a day (BID) | ORAL | Status: DC

## 2011-07-11 NOTE — Telephone Encounter (Addendum)
Rx sent to pharmacy for Nisapin.

## 2011-07-11 NOTE — Telephone Encounter (Signed)
Addended by: Azucena Freed on: 07/11/2011 02:34 PM   Modules accepted: Orders

## 2011-08-10 ENCOUNTER — Ambulatory Visit (INDEPENDENT_AMBULATORY_CARE_PROVIDER_SITE_OTHER): Payer: Medicare PPO | Admitting: Cardiovascular Disease

## 2011-08-10 ENCOUNTER — Encounter: Payer: Self-pay | Admitting: Cardiovascular Disease

## 2011-08-10 ENCOUNTER — Ambulatory Visit (INDEPENDENT_AMBULATORY_CARE_PROVIDER_SITE_OTHER): Payer: Medicare PPO | Admitting: *Deleted

## 2011-08-10 VITALS — BP 115/80 | HR 64 | Wt 177.0 lb

## 2011-08-10 DIAGNOSIS — E7800 Pure hypercholesterolemia, unspecified: Secondary | ICD-10-CM

## 2011-08-10 DIAGNOSIS — I4891 Unspecified atrial fibrillation: Secondary | ICD-10-CM

## 2011-08-10 DIAGNOSIS — Z7901 Long term (current) use of anticoagulants: Secondary | ICD-10-CM

## 2011-08-10 DIAGNOSIS — I2510 Atherosclerotic heart disease of native coronary artery without angina pectoris: Secondary | ICD-10-CM

## 2011-08-10 DIAGNOSIS — E119 Type 2 diabetes mellitus without complications: Secondary | ICD-10-CM

## 2011-08-10 DIAGNOSIS — I10 Essential (primary) hypertension: Secondary | ICD-10-CM

## 2011-08-10 LAB — POCT INR: INR: 2.2

## 2011-08-10 MED ORDER — ATORVASTATIN CALCIUM 10 MG PO TABS: 10.0000 mg | ORAL_TABLET | Freq: Every day | ORAL | Status: DC

## 2011-08-10 NOTE — Patient Instructions (Signed)
Your physician recommends that you schedule a follow-up appointment asap with Dr. Johney Frame for eval afib ablation.

## 2011-08-10 NOTE — Assessment & Plan Note (Signed)
Discussed low carb diet.  Target hemoglobin A1c is 6.5 or less.  Continue current medications.  

## 2011-08-10 NOTE — Assessment & Plan Note (Signed)
Cholesterol is at goal.  Continue current dose of statin and diet Rx.  No myalgias or side effects.  F/U  LFT's in 6 months. Lab Results  Component Value Date   LDLCALC 74 03/19/2011

## 2011-08-10 NOTE — Assessment & Plan Note (Signed)
CAD and LVH limit drug choices.  Tikosyn dose decrease due to QT issues.  QT 426 today.  Significant burden of PAF clinically.  Rate contorl and anticoagulaiton ok.  Feels better in NSR.  Refer back to [facility] for consideration of ablation.

## 2011-08-10 NOTE — Assessment & Plan Note (Signed)
Well controlled.  Continue current medications and low sodium Dash type diet.    

## 2011-08-10 NOTE — Assessment & Plan Note (Signed)
Stable with no angina and good activity level.  Continue medical Rx  

## 2011-08-10 NOTE — Progress Notes (Signed)
Patient ID: [name], female   DOB: 08-01-1929, 78 y.o.   MRN: [id number] [name] is a pleasant 78 y.o. WF patient with a h/o persistent atrial fibrillation, CAD, and ischemic CM who presents today for EP consultation regarding her atrial fibrillation. She has previously required multiple cardioversions with her afib. She has moderate atrial enlargement as well as an ischemic CM. She has failed medical therapy with amiodarone due to increased PFTs. Most recently she was admitted to [facility] 6/12 for tikosyn loading. She was cardioverted by Dr Myrtis Ser. Upon follow-up with Tereso Newcomer 10/23/10, 78 y.o. she had returned to afib.    During afib, she typically notices palpitations and fatigue.She is appropriately anticoagulated with coumadin.  It is clear that she is having more paroxysms of afib and is in afib today.  I dont think the Tikosyn is holding her as well.  She is willing to speak to Dr Johney Frame about an ablation again and I think this would be the best approach.   ROS: Denies fever, malais, weight loss, blurry vision, decreased visual acuity, cough, sputum, SOB, hemoptysis, pleuritic pain, palpitaitons, heartburn, abdominal pain, melena, lower extremity edema, claudication, or rash.  All other systems reviewed and negative  General: Affect appropriate Healthy:  appears stated age HEENT: normal Neck supple with no adenopathy JVP normal no bruits no thyromegaly Lungs clear with no wheezing and good diaphragmatic motion Heart:  S1/S2 no murmur, no rub, gallop or click PMI normal Abdomen: benighn, BS positve, no tenderness, no AAA no bruit.  No HSM or HJR Distal pulses intact with no bruits No edema Neuro non-focal Skin warm and dry echymosis on arms from coumadin No muscular weakness   Current Outpatient Prescriptions  Medication Sig Dispense Refill  . aspirin 81 MG EC tablet Take 81 mg by mouth daily.        Marland Kitchen atorvastatin (LIPITOR) 10 MG tablet Take 10 mg by mouth daily.        .  Blood Glucose Monitoring Suppl (ACCU-CHEK ACTIVE CARE KIT) KIT Disp 11/06/10  1 each  0  . Calcium Carbonate-Vitamin D (CALCIUM 500 + D) 500-125 MG-UNIT TABS Take 1 tablet by mouth 2 (two) times daily.       . Cholecalciferol (VITAMIN D-3 PO) Take by mouth daily.        Marland Kitchen dofetilide (TIKOSYN) 250 MCG capsule Take 1 capsule (250 mcg total) by mouth 2 (two) times daily.  60 capsule  5  . furosemide (LASIX) 40 MG tablet Take 40 mg by mouth daily. As needed       . gabapentin (NEURONTIN) 100 MG capsule Take 100 mg by mouth as needed.        Marland Kitchen glucose blood (ACCU-CHEK AVIVA) test strip Use as directed  100 each  5  . Lancets (ACCU-CHEK MULTICLIX) lancets Use as directed  100 each  5  . metoprolol (LOPRESSOR) 50 MG tablet Take 1 tablet (50 mg total) by mouth 2 (two) times daily.  60 tablet  11  . Multiple Vitamin (MULTIVITAMIN PO) Take 1 tablet by mouth daily.        . niacin (NIASPAN) 500 MG CR tablet Take 1 tablet (500 mg total) by mouth 2 (two) times daily.  180 tablet  0  . potassium chloride (MICRO-K) 10 MEQ CR capsule Take 10 mEq by mouth daily. As needed       . ramipril (ALTACE) 10 MG capsule Take 1 capsule (10 mg total) by mouth daily.  90 capsule  1  . vitamin E 100 UNIT capsule Take 100 Units by mouth daily.        Marland Kitchen warfarin (COUMADIN) 2.5 MG tablet Take by mouth as directed.          Allergies  Simvastatin  Electrocardiogram:  afib rate 85 LVH with hypertrophy  Assessment and Plan

## 2011-08-13 ENCOUNTER — Telehealth: Payer: Self-pay | Admitting: Internal Medicine

## 2011-08-13 NOTE — Telephone Encounter (Signed)
Pt's dtr calling re appt for pt 4-25 @ 130p, pt was worked in but dtr says she can't bring her, needs appt after 3p, it was worked in before he normally starts, so is this someone that needs to be double booked? Will send to [facility] for4-23-13

## 2011-08-14 NOTE — Telephone Encounter (Signed)
08-14-11 Per kelly pt can be worked in 5-13 @ 345p, next available work in time after 3p, called @912am  and left message for dtr susan to see if she can keep 4-25 if not if she would like 5-13

## 2011-08-16 ENCOUNTER — Ambulatory Visit: Payer: Medicare PPO | Admitting: Internal Medicine

## 2011-09-03 ENCOUNTER — Ambulatory Visit (INDEPENDENT_AMBULATORY_CARE_PROVIDER_SITE_OTHER): Payer: Medicare PPO | Admitting: Internal Medicine

## 2011-09-03 ENCOUNTER — Encounter: Payer: Self-pay | Admitting: Internal Medicine

## 2011-09-03 VITALS — BP 130/63 | HR 70 | Resp 18 | Ht 66.0 in | Wt 180.0 lb

## 2011-09-03 DIAGNOSIS — I4891 Unspecified atrial fibrillation: Secondary | ICD-10-CM

## 2011-09-03 DIAGNOSIS — T50904A Poisoning by unspecified drugs, medicaments and biological substances, undetermined, initial encounter: Secondary | ICD-10-CM

## 2011-09-03 DIAGNOSIS — I428 Other cardiomyopathies: Secondary | ICD-10-CM

## 2011-09-03 DIAGNOSIS — I10 Essential (primary) hypertension: Secondary | ICD-10-CM

## 2011-09-03 MED ORDER — METOPROLOL TARTRATE 50 MG PO TABS: ORAL_TABLET | ORAL | Status: DC

## 2011-09-03 NOTE — Progress Notes (Signed)
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 female patient with a h/o persistent atrial fibrillation, CAD, and ischemic CM who presents today for EP follow-up.   She has previously required multiple cardioversions with her afib.  She has moderate atrial enlargement as well as an ischemic CM.  She has failed medical therapy with amiodarone due to increased PFTs.  Most recently she was admitted to [facility] 6/12 for tikosyn loading and was cardioverted by Dr Myrtis Ser.  She did well for several months but has had increasing atrial fibrillation since that time.   She reports palpitations and fatigue.  Despite afib, she maintains an active lifestyle.  She does not feel that her afib significant reduces her quality of life.  She is appropriately anticoagulated with coumadin.  Today, she denies symptoms of chest pain, shortness of breath, orthopnea, PND, lower extremity edema, dizziness, presyncope, syncope, or neurologic sequela. The patient is tolerating medications without difficulties and is otherwise without complaint today.   Past Medical History  Diagnosis Date  . DIABETES MELLITUS 09/24/2008  . HYPERLIPIDEMIA 09/24/2008  . HYPERTENSION 09/24/2008  . MI 09/24/2008  . CAD 09/24/2008    AMI in 1998 tx with PCI to LAD;  myoview 1/12:  no ischemia, EF 45%  . CARDIOMYOPATHY 09/24/2008    ischemic;  echo 4/12:  EF 20-25%, mild MR, mod LAE, mod reduced RVSF, mod RVE, mild RAE, mild TR, PASP 40  . Persistent atrial fibrillation 09/24/2008    s/p DCCV x 4 in past;  Amiod d/c'd 2/2 abnormal PFTs;  Tikosyn load 6/12 with DCCV  . Edema 09/24/2008  . CAROTID BRUIT 09/24/2008  . Anemia   . GERD (gastroesophageal reflux disease)   . Osteopenia    Past Surgical History  Procedure Date  . Fracture surgery 2006    hip  . Knee surgery 2008    broken knee    Current Outpatient Prescriptions  Medication Sig Dispense Refill  . aspirin 81 MG EC tablet Take 81 mg by mouth  daily.        Marland Kitchen atorvastatin (LIPITOR) 10 MG tablet Take 1 tablet (10 mg total) by mouth daily.  90 tablet  3  . Blood Glucose Monitoring Suppl (ACCU-CHEK ACTIVE CARE KIT) KIT Disp 11/06/10  1 each  0  . Calcium Carbonate-Vitamin D (CALCIUM 500 + D) 500-125 MG-UNIT TABS Take 1 tablet by mouth 2 (two) times daily.       . Cholecalciferol (VITAMIN D-3 PO) Take by mouth daily.        Marland Kitchen dofetilide (TIKOSYN) 250 MCG capsule Take 1 capsule (250 mcg total) by mouth 2 (two) times daily.  60 capsule  5  . gabapentin (NEURONTIN) 100 MG capsule Take 100 mg by mouth as needed.        Marland Kitchen glucose blood (ACCU-CHEK AVIVA) test strip Use as directed  100 each  5  . Lancets (ACCU-CHEK MULTICLIX) lancets Use as directed  100 each  5  . metoprolol (LOPRESSOR) 50 MG tablet Take 1 1/2 tablets twice daily  90 tablet  11  . Multiple Vitamin (MULTIVITAMIN PO) Take 1 tablet by mouth daily.        . niacin (NIASPAN) 500 MG CR tablet Take 1 tablet (500 mg total) by mouth 2 (two) times daily.  180 tablet  0  . ramipril (ALTACE) 10 MG capsule Take 1 capsule (10 mg total) by mouth daily.  90 capsule  1  .  vitamin E 100 UNIT capsule Take 100 Units by mouth daily.        Marland Kitchen warfarin (COUMADIN) 2.5 MG tablet Take by mouth as directed.        Marland Kitchen DISCONTD: metoprolol (LOPRESSOR) 50 MG tablet Take 1 tablet (50 mg total) by mouth 2 (two) times daily.  60 tablet  11  . furosemide (LASIX) 40 MG tablet Take 40 mg by mouth daily. As needed       . potassium chloride (MICRO-K) 10 MEQ CR capsule Take 10 mEq by mouth daily. As needed         Allergies  Allergen Reactions  . Simvastatin     REACTION: rash    History   Social History  . Marital Status: Widowed    Spouse Name: N/A    Number of Children: N/A  . Years of Education: N/A   Occupational History  . Not on file.   Social History Main Topics  . Smoking status: Former Smoker -- 0.5 packs/day for 20 years    Types: Cigarettes    Quit date: 07/11/1996  . Smokeless  tobacco: Not on file  . Alcohol Use: No  . Drug Use: No  . Sexually Active: Not on file   Other Topics Concern  . Not on file   Social History Narrative  . No narrative on file    Family History  Problem Relation Age of Onset  . Cancer Mother     breast  . Heart disease Mother   . Diabetes Mother   . Alcohol abuse Father   . Cancer Sister     breast  . Alcohol abuse Brother   . Coronary artery disease    . Hypertension     Physical Exam: Filed Vitals:   09/03/11 1636  BP: 130/63  Pulse: 70  Resp: 18  Height: 5\' 6"  (1.676 m)  Weight: 180 lb (81.647 kg)    GEN- The patient is elderly appearing, alert and oriented x 3 today.   Head- normocephalic, atraumatic Eyes-  Sclera clear, conjunctiva pink Ears- hearing intact Oropharynx- clear Neck- supple, no JVP Lymph- no cervical lymphadenopathy Lungs- Clear to ausculation bilaterally, normal work of breathing Heart- irregular rate and rhythm, no murmurs, rubs or gallops,  GI- soft, NT, ND, + BS Extremities- no clubbing, cyanosis, 1+ pedal edema  EKG today reveals afib, V rate 106, LVH, nonspecific ST/T changes  Assessment and Plan:

## 2011-09-03 NOTE — Patient Instructions (Signed)
Your physician recommends that you schedule a follow-up appointment in: 6 weeks with Dr Johney Frame  Your physician recommends that you return for lab work in:  1) Increase your Metoprolol 75mg  twice daily

## 2011-09-03 NOTE — Assessment & Plan Note (Signed)
The patient has persistent afib for which she is only mildly symptomatic.  She has failed medical therapy with amiodarone and tikosyn. Therapeutic strategies for afib including rate control and rhythm control were discussed in detail with the patient today. Risk, benefits, and alternatives to EP study and radiofrequency ablation for afib were also discussed in detail today.  Other than her present tikosyn, I do not feel that she is a candidate for other AADs.  At this time, she wishes to continue her current medicine regimen and contemplate ablation.  I have increased metoprolol to 75mg  BID today. She will return in 4-6 weeks. At that time, if she has decided upon rate control, then we will stop tikosyn.  If she has decided to pursue ablation, then we will continue tikosyn and proceed with ablation.

## 2011-09-03 NOTE — Assessment & Plan Note (Signed)
Stable No change required today  

## 2011-09-03 NOTE — Assessment & Plan Note (Signed)
Stable, NYHA Class II CHF Given advanced age, I would not recommend ICD implantation in this patient. Continue medical therapy per Dr Nishan. 

## 2011-09-21 ENCOUNTER — Ambulatory Visit (INDEPENDENT_AMBULATORY_CARE_PROVIDER_SITE_OTHER): Payer: Medicare PPO

## 2011-09-21 DIAGNOSIS — Z7901 Long term (current) use of anticoagulants: Secondary | ICD-10-CM

## 2011-09-21 DIAGNOSIS — I4891 Unspecified atrial fibrillation: Secondary | ICD-10-CM

## 2011-09-21 LAB — POCT INR: INR: 1.7

## 2011-09-21 MED ORDER — POTASSIUM CHLORIDE ER 10 MEQ PO CPCR: 10.0000 meq | ORAL_CAPSULE | Freq: Every day | ORAL | Status: DC

## 2011-09-21 MED ORDER — FUROSEMIDE 40 MG PO TABS: 40.0000 mg | ORAL_TABLET | Freq: Every day | ORAL | Status: DC

## 2011-09-21 MED ORDER — WARFARIN SODIUM 2.5 MG PO TABS: ORAL_TABLET | ORAL | Status: DC

## 2011-09-25 NOTE — Progress Notes (Signed)
Addended by: Reine Just on: 09/25/2011 07:20 PM   Modules accepted: Orders

## 2011-10-05 ENCOUNTER — Ambulatory Visit (INDEPENDENT_AMBULATORY_CARE_PROVIDER_SITE_OTHER): Payer: Medicare PPO | Admitting: Family Medicine

## 2011-10-05 ENCOUNTER — Encounter: Payer: Self-pay | Admitting: Family Medicine

## 2011-10-05 VITALS — BP 130/80 | HR 96 | Temp 97.7°F | Wt 182.0 lb

## 2011-10-05 DIAGNOSIS — E119 Type 2 diabetes mellitus without complications: Secondary | ICD-10-CM

## 2011-10-05 DIAGNOSIS — E785 Hyperlipidemia, unspecified: Secondary | ICD-10-CM

## 2011-10-05 DIAGNOSIS — I10 Essential (primary) hypertension: Secondary | ICD-10-CM

## 2011-10-05 LAB — HEMOGLOBIN A1C: Hgb A1c MFr Bld: 5.9 % (ref 4.6–6.5)

## 2011-10-05 NOTE — Progress Notes (Signed)
Subjective:    Patient ID: [name], female    DOB: 03/07/1930, 78 y.o.   MRN: [id number]  HPI  Medical followup. Patient has history of atrial fibrillation, type diabetes, hyperlipidemia, hypertension, CAD. Recent titration of metoprolol secondary to increased pulse. She has subjective sensation of palpitations occasionally but no recent dizziness. No syncope. No chest pains. She has a chronic mild left lower extremity edema related to previous leg trauma. She remains on Coumadin recent INR 1.7.  Type 2 diabetes. Fasting blood sugar today 121. A1c's have shown consistent good control. She does not take any diabetic medications.  medications are reviewed. Compliant with all. Rarely takes Lasix for edema issues.  Past Medical History  Diagnosis Date  . DIABETES MELLITUS 09/24/2008  . HYPERLIPIDEMIA 09/24/2008  . HYPERTENSION 09/24/2008  . MI 09/24/2008  . CAD 09/24/2008    [facility] in 1998 tx with PCI to LAD;  myoview 1/12:  no ischemia, EF 45%  . CARDIOMYOPATHY 09/24/2008    ischemic;  echo 4/12:  EF 20-25%, mild MR, mod LAE, mod reduced RVSF, mod RVE, mild RAE, mild TR, PASP 40  . Persistent atrial fibrillation 09/24/2008    s/p DCCV x 4 in past;  Amiod d/c'd 2/2 abnormal PFTs;  Tikosyn load 6/12 with DCCV  . Edema 09/24/2008  . CAROTID BRUIT 09/24/2008  . Anemia   . GERD (gastroesophageal reflux disease)   . Osteopenia    Past Surgical History  Procedure Date  . Fracture surgery 2006    hip  . Knee surgery 2008    broken knee    reports that she quit smoking about 15 years ago. Her smoking use included Cigarettes. She has a 10 pack-year smoking history. She does not have any smokeless tobacco history on file. She reports that she does not drink alcohol or use illicit drugs. family history includes Alcohol abuse in her brother and father; Cancer in her mother and sister; Coronary artery disease in an unspecified family member; Diabetes in her mother; Heart disease in her mother; and Hypertension  in an unspecified family member. Allergies  Allergen Reactions  . Simvastatin     REACTION: rash     Review of Systems  Constitutional: Negative for fatigue.  Eyes: Negative for visual disturbance.  Respiratory: Negative for cough, chest tightness, shortness of breath and wheezing.   Cardiovascular: Negative for chest pain, palpitations and leg swelling.  Neurological: Negative for dizziness, seizures, syncope, weakness, light-headedness and headaches.       Objective:   Physical Exam  Constitutional: She is oriented to person, place, and time. She appears well-developed and well-nourished.  HENT:  Right Ear: External ear normal.  Left Ear: External ear normal.  Mouth/Throat: Oropharynx is clear and moist.  Neck: Neck supple. No thyromegaly present.  Cardiovascular:       Patient has regular rhythm today with rate of 96.  Pulmonary/Chest: Effort normal and breath sounds normal. No respiratory distress. She has no wheezes. She has no rales.  Musculoskeletal:       Trace edema left leg. No foot lesions. She has bunion which is left foot with small callus over the first MTP joint. No ulceration. Normal sensory function.  Neurological: She is alert and oriented to person, place, and time.          Assessment & Plan:  #1 type 2 diabetes. Recheck A1c today. Work on weight loss  #2 history of atrial fibrillation. Currently sinus rhythm today. Patient remains on medication for rate  control and anticoagulation. Follow through Coumadin clinic  #3 hypertension stable #4 hyperlipidemia. We'll plan repeat lipids in followup in 6 months.

## 2011-10-08 NOTE — Progress Notes (Signed)
Quick Note:  Pt informed on home VM ______ 

## 2011-10-11 ENCOUNTER — Other Ambulatory Visit: Payer: Self-pay | Admitting: Family Medicine

## 2011-10-11 MED ORDER — RAMIPRIL 10 MG PO CAPS: 10.0000 mg | ORAL_CAPSULE | Freq: Every day | ORAL | Status: DC

## 2011-10-11 MED ORDER — NIACIN ER (ANTIHYPERLIPIDEMIC) 500 MG PO TBCR: 500.0000 mg | EXTENDED_RELEASE_TABLET | Freq: Two times a day (BID) | ORAL | Status: DC

## 2011-10-11 NOTE — Telephone Encounter (Signed)
Pt needs refill on ramipril #90 and niaspan 500 mg #180 call into [facility]

## 2011-10-22 ENCOUNTER — Encounter: Payer: Self-pay | Admitting: Internal Medicine

## 2011-10-22 ENCOUNTER — Ambulatory Visit (INDEPENDENT_AMBULATORY_CARE_PROVIDER_SITE_OTHER): Payer: Medicare PPO | Admitting: Internal Medicine

## 2011-10-22 ENCOUNTER — Ambulatory Visit (INDEPENDENT_AMBULATORY_CARE_PROVIDER_SITE_OTHER): Payer: Medicare PPO | Admitting: *Deleted

## 2011-10-22 VITALS — BP 132/84 | HR 70 | Ht 65.0 in | Wt 177.0 lb

## 2011-10-22 DIAGNOSIS — I2510 Atherosclerotic heart disease of native coronary artery without angina pectoris: Secondary | ICD-10-CM

## 2011-10-22 DIAGNOSIS — Z7901 Long term (current) use of anticoagulants: Secondary | ICD-10-CM

## 2011-10-22 DIAGNOSIS — I10 Essential (primary) hypertension: Secondary | ICD-10-CM

## 2011-10-22 DIAGNOSIS — I4891 Unspecified atrial fibrillation: Secondary | ICD-10-CM

## 2011-10-22 MED ORDER — METOPROLOL TARTRATE 100 MG PO TABS: 100.0000 mg | ORAL_TABLET | Freq: Two times a day (BID) | ORAL | Status: DC

## 2011-10-22 NOTE — Assessment & Plan Note (Signed)
The patient has persistent afib for which she is only mildly symptomatic.  She has failed medical therapy with amiodarone and tikosyn.  She is reasonably rate controlled at this time. Therapeutic strategies for afib including rate control and rhythm control were discussed in detail with the patient today. Risk, benefits, and alternatives to EP study and radiofrequency ablation for afib were also discussed in detail today.  She is very clear that she wishes to avoid ablation.  She is leaning towards rate control long term.  At this time, her daughter wonders if she is having some sinus rhythm for which tikosyn has been helpful.  My suspicion is that she is in afib all of the time, however, I think that it would be prudent to better evaluate this issue. I will therefore place an AF express (lifewatch) event monitor to better evaluate her afib burden.  If she has only afib, then we should stop tikosyn.  IF she has a significant amount of sinus rhythm, then perhaps we will continue tikosyn. In the interim, I will increase metoprolol to 100mg  BID for better rate control.  She will return in 8-10 weeks.

## 2011-10-22 NOTE — Assessment & Plan Note (Signed)
No ischemic symptoms 

## 2011-10-22 NOTE — Patient Instructions (Addendum)
Your physician recommends that you schedule a follow-up appointment in: 8-10 weeks  Your physician has recommended that you wear an event monitor. Event monitors are medical devices that record the heart's electrical activity. Doctors most often [place] these monitors to diagnose arrhythmias. Arrhythmias are problems with the speed or rhythm of the heartbeat. The monitor is a small, portable device. You can wear one while you do your normal daily activities. This is usually used to diagnose what is causing palpitations/syncope (passing out).  Your physician has recommended you make the following change in your medication: Increase your Metoprolol to 100mg  twice daily (You can take 2 tablets of the Metoprolol you have at home, when you pick up the new rx, you will be taking one tablet daily.)

## 2011-10-22 NOTE — Progress Notes (Signed)
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  Since her last visit, she reports that her palpitations and fatigue have significantly improved.  She attributes this to better rate control with metoprolol.  Despite afib, she maintains an active lifestyle.  She does not feel that her afib significant reduces her quality of life.  She is appropriately anticoagulated with coumadin.  Today, she denies symptoms of chest pain, shortness of breath, orthopnea, PND, lower extremity edema, dizziness, presyncope, syncope, or neurologic sequela. The patient is tolerating medications without difficulties and is otherwise without complaint today.   Past Medical History  Diagnosis Date  . DIABETES MELLITUS 09/24/2008  . HYPERLIPIDEMIA 09/24/2008  . HYPERTENSION 09/24/2008  . MI 09/24/2008  . CAD 09/24/2008    [facility] in 1998 tx with PCI to LAD;  myoview 1/12:  no ischemia, EF 45%  . CARDIOMYOPATHY 09/24/2008    ischemic;  echo 4/12:  EF 20-25%, mild MR, mod LAE, mod reduced RVSF, mod RVE, mild RAE, mild TR, PASP 40  . Persistent atrial fibrillation 09/24/2008    s/p DCCV x 4 in past;  Amiod d/c'd 2/2 abnormal PFTs;  Tikosyn load 6/12 with DCCV  . Edema 09/24/2008  . CAROTID BRUIT 09/24/2008  . Anemia   . GERD (gastroesophageal reflux disease)   . Osteopenia    Past Surgical History  Procedure Date  . Fracture surgery 2006    hip  . Knee surgery 2008    broken knee    Current Outpatient Prescriptions  Medication Sig Dispense Refill  . aspirin 81 MG EC tablet Take 81 mg by mouth daily.        Marland Kitchen atorvastatin (LIPITOR) 10 MG tablet Take 1 tablet (10 mg total) by mouth daily.  90 tablet  3  . Blood Glucose Monitoring Suppl (ACCU-CHEK ACTIVE CARE KIT) KIT Disp 11/06/10  1 each  0  . Calcium Carbonate-Vitamin D (CALCIUM 500 + D) 500-125 MG-UNIT TABS Take 1 tablet by  mouth 2 (two) times daily.       . Cholecalciferol (VITAMIN D-3 PO) Take by mouth daily.        Marland Kitchen dofetilide (TIKOSYN) 250 MCG capsule Take 1 capsule (250 mcg total) by mouth 2 (two) times daily.  60 capsule  5  . furosemide (LASIX) 40 MG tablet Take 1 tablet (40 mg total) by mouth daily. As needed  30 tablet  3  . gabapentin (NEURONTIN) 100 MG capsule Take 100 mg by mouth as needed.        Marland Kitchen glucose blood (ACCU-CHEK AVIVA) test strip Use as directed  100 each  5  . Lancets (ACCU-CHEK MULTICLIX) lancets Use as directed  100 each  5  . metoprolol (LOPRESSOR) 100 MG tablet Take 1 tablet (100 mg total) by mouth 2 (two) times daily.  90 tablet  11  . Multiple Vitamin (MULTIVITAMIN PO) Take 1 tablet by mouth daily.        . niacin (NIASPAN) 500 MG CR tablet Take 1 tablet (500 mg total) by mouth 2 (two) times daily.  180 tablet  3  . potassium chloride (MICRO-K) 10 MEQ CR capsule Take 1 capsule (10 mEq total) by mouth daily. As needed  30 capsule  3  . ramipril (ALTACE) 10 MG capsule Take 1 capsule (10 mg total) by mouth daily.  90 capsule  3  . vitamin E 100 UNIT capsule Take 100 Units by mouth daily.        Marland Kitchen warfarin (COUMADIN) 2.5 MG tablet Take as directed by anticoagulation clinic  30 tablet  3  . DISCONTD: metoprolol (LOPRESSOR) 50 MG tablet Take 1 1/2 tablets twice daily  90 tablet  11    Allergies  Allergen Reactions  . Simvastatin     REACTION: rash    History   Social History  . Marital Status: Widowed    Spouse Name: N/A    Number of Children: N/A  . Years of Education: N/A   Occupational History  . Not on file.   Social History Main Topics  . Smoking status: Former Smoker -- 0.5 packs/day for 20 years    Types: Cigarettes    Quit date: 07/11/1996  . Smokeless tobacco: Not on file  . Alcohol Use: No  . Drug Use: No  . Sexually Active: Not on file   Other Topics Concern  . Not on file   Social History Narrative  . No narrative on file    Family History  Problem  Relation Age of Onset  . Cancer Mother     breast  . Heart disease Mother   . Diabetes Mother   . Alcohol abuse Father   . Cancer Sister     breast  . Alcohol abuse Brother   . Coronary artery disease    . Hypertension     Physical Exam: Filed Vitals:   10/22/11 1626  BP: 132/84  Pulse: 70  Height: 5\' 5"  (1.651 m)  Weight: 177 lb (80.287 kg)    GEN- The patient is elderly appearing, alert and oriented x 3 today.   Head- normocephalic, atraumatic Eyes-  Sclera clear, conjunctiva pink Ears- hearing intact Oropharynx- clear Neck- supple, no JVP Lymph- no cervical lymphadenopathy Lungs- Clear to ausculation bilaterally, normal work of breathing Heart- irregular rate and rhythm, no murmurs, rubs or gallops,  GI- soft, NT, ND, + BS Extremities- no clubbing, cyanosis, 1+ pedal edema  Assessment and Plan:

## 2011-10-22 NOTE — Assessment & Plan Note (Signed)
Stable No change required today  

## 2011-10-23 ENCOUNTER — Other Ambulatory Visit: Payer: Self-pay | Admitting: Internal Medicine

## 2011-10-23 MED ORDER — DOFETILIDE 250 MCG PO CAPS: 250.0000 ug | ORAL_CAPSULE | Freq: Two times a day (BID) | ORAL | Status: DC

## 2011-11-06 ENCOUNTER — Encounter (INDEPENDENT_AMBULATORY_CARE_PROVIDER_SITE_OTHER): Payer: Medicare PPO

## 2011-11-06 ENCOUNTER — Ambulatory Visit (INDEPENDENT_AMBULATORY_CARE_PROVIDER_SITE_OTHER): Payer: Medicare PPO

## 2011-11-06 DIAGNOSIS — Z7901 Long term (current) use of anticoagulants: Secondary | ICD-10-CM

## 2011-11-06 DIAGNOSIS — I4891 Unspecified atrial fibrillation: Secondary | ICD-10-CM

## 2011-12-04 ENCOUNTER — Ambulatory Visit (INDEPENDENT_AMBULATORY_CARE_PROVIDER_SITE_OTHER): Payer: Medicare PPO | Admitting: *Deleted

## 2011-12-04 DIAGNOSIS — I4891 Unspecified atrial fibrillation: Secondary | ICD-10-CM

## 2011-12-04 DIAGNOSIS — Z7901 Long term (current) use of anticoagulants: Secondary | ICD-10-CM

## 2011-12-19 ENCOUNTER — Ambulatory Visit: Payer: Medicare PPO | Admitting: Internal Medicine

## 2012-01-01 ENCOUNTER — Ambulatory Visit (INDEPENDENT_AMBULATORY_CARE_PROVIDER_SITE_OTHER): Payer: Medicare PPO

## 2012-01-01 DIAGNOSIS — Z7901 Long term (current) use of anticoagulants: Secondary | ICD-10-CM

## 2012-01-01 DIAGNOSIS — I4891 Unspecified atrial fibrillation: Secondary | ICD-10-CM

## 2012-01-01 LAB — POCT INR: INR: 2.1

## 2012-01-07 ENCOUNTER — Ambulatory Visit: Payer: Medicare PPO | Admitting: Internal Medicine

## 2012-02-11 ENCOUNTER — Other Ambulatory Visit: Payer: Self-pay | Admitting: Cardiovascular Disease

## 2012-02-11 ENCOUNTER — Encounter: Payer: Self-pay | Admitting: Internal Medicine

## 2012-02-11 ENCOUNTER — Ambulatory Visit (INDEPENDENT_AMBULATORY_CARE_PROVIDER_SITE_OTHER): Payer: Medicare PPO

## 2012-02-11 ENCOUNTER — Ambulatory Visit (INDEPENDENT_AMBULATORY_CARE_PROVIDER_SITE_OTHER): Payer: Medicare PPO | Admitting: Internal Medicine

## 2012-02-11 VITALS — BP 106/70 | HR 123 | Ht 65.0 in | Wt 180.0 lb

## 2012-02-11 DIAGNOSIS — I4891 Unspecified atrial fibrillation: Secondary | ICD-10-CM

## 2012-02-11 DIAGNOSIS — Z7901 Long term (current) use of anticoagulants: Secondary | ICD-10-CM

## 2012-02-11 MED ORDER — DILTIAZEM HCL ER COATED BEADS 120 MG PO CP24: 120.0000 mg | ORAL_CAPSULE | Freq: Every day | ORAL | Status: DC

## 2012-02-11 NOTE — Patient Instructions (Addendum)
Your physician recommends that you schedule a follow-up appointment in: 6 weeks with Dr Johney Frame Your physician recommends that you schedule a follow-up appointment in: 2 weeks for an EKG with a nurse Your physician has recommended you make the following change in your medication: START Diltiazem 120 mg daily

## 2012-02-18 NOTE — Assessment & Plan Note (Signed)
V rates are elevated today Add diltiazem CD 120mg  daily Return in 2 weeks for EKG. If V rates remain elevated, will increase cardizem CD to 240mg  daily at that time.  Continue coumadin

## 2012-02-18 NOTE — Progress Notes (Signed)
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  Since her last visit, she reports that her palpitations and fatigue are stable. She maintains an active lifestyle.  She does not feel that her afib significant reduces her quality of life.  She is appropriately anticoagulated with coumadin.  Today, she denies symptoms of chest pain, shortness of breath, orthopnea, PND, lower extremity edema, dizziness, presyncope, syncope, or neurologic sequela. The patient is tolerating medications without difficulties and is otherwise without complaint today.   Past Medical History  Diagnosis Date  . DIABETES MELLITUS 09/24/2008  . HYPERLIPIDEMIA 09/24/2008  . HYPERTENSION 09/24/2008  . MI 09/24/2008  . CAD 09/24/2008    [facility] in 1998 tx with PCI to LAD;  myoview 1/12:  no ischemia, EF 45%  . CARDIOMYOPATHY 09/24/2008    ischemic;  echo 4/12:  EF 20-25%, mild MR, mod LAE, mod reduced RVSF, mod RVE, mild RAE, mild TR, PASP 40  . Persistent atrial fibrillation 09/24/2008    s/p DCCV x 4 in past;  Amiod d/c'd 2/2 abnormal PFTs;  Tikosyn load 6/12 with DCCV  . Edema 09/24/2008  . CAROTID BRUIT 09/24/2008  . Anemia   . GERD (gastroesophageal reflux disease)   . Osteopenia    Past Surgical History  Procedure Date  . Fracture surgery 2006    hip  . Knee surgery 2008    broken knee    Current Outpatient Prescriptions  Medication Sig Dispense Refill  . aspirin 81 MG EC tablet Take 81 mg by mouth daily.        Marland Kitchen atorvastatin (LIPITOR) 10 MG tablet Take 1 tablet (10 mg total) by mouth daily.  90 tablet  3  . Blood Glucose Monitoring Suppl (ACCU-CHEK ACTIVE CARE KIT) KIT Disp 11/06/10  1 each  0  . Calcium Carbonate-Vitamin D (CALCIUM 500 + D) 500-125 MG-UNIT TABS Take 1 tablet by mouth 2 (two) times daily.       . Cholecalciferol (VITAMIN D-3 PO) Take by mouth daily.         Marland Kitchen dofetilide (TIKOSYN) 250 MCG capsule Take 1 capsule (250 mcg total) by mouth 2 (two) times daily.  60 capsule  5  . furosemide (LASIX) 40 MG tablet Take 1 tablet (40 mg total) by mouth daily. As needed  30 tablet  3  . gabapentin (NEURONTIN) 100 MG capsule Take 100 mg by mouth as needed.        . metoprolol (LOPRESSOR) 100 MG tablet Take 1 tablet (100 mg total) by mouth 2 (two) times daily.  90 tablet  11  . Multiple Vitamin (MULTIVITAMIN PO) Take 1 tablet by mouth daily.        . niacin (NIASPAN) 500 MG CR tablet Take 1 tablet (500 mg total) by mouth 2 (two) times daily.  180 tablet  3  . potassium chloride (MICRO-K) 10 MEQ CR capsule Take 1 capsule (10 mEq total) by mouth daily. As needed  30 capsule  3  . ramipril (ALTACE) 10 MG capsule Take 1 capsule (10 mg total) by mouth daily.  90 capsule  3  . vitamin E 100 UNIT capsule Take 100 Units by mouth daily.        Marland Kitchen diltiazem (CARDIZEM CD) 120 MG 24 hr capsule Take 1 capsule (120 mg total) by mouth daily.  90 capsule  3  .  warfarin (COUMADIN) 2.5 MG tablet TAKE AS DIRECTED BY ANTICOAGULATION CLINIC.  30 tablet  3    Allergies  Allergen Reactions  . Simvastatin     REACTION: rash    History   Social History  . Marital Status: Widowed    Spouse Name: N/A    Number of Children: N/A  . Years of Education: N/A   Occupational History  . Not on file.   Social History Main Topics  . Smoking status: Former Smoker -- 0.5 packs/day for 20 years    Types: Cigarettes    Quit date: 07/11/1996  . Smokeless tobacco: Not on file  . Alcohol Use: No  . Drug Use: No  . Sexually Active: Not on file   Other Topics Concern  . Not on file   Social History Narrative  . No narrative on file    Family History  Problem Relation Age of Onset  . Cancer Mother     breast  . Heart disease Mother   . Diabetes Mother   . Alcohol abuse Father   . Cancer Sister     breast  . Alcohol abuse Brother   . Coronary artery disease    .  Hypertension     Physical Exam: Filed Vitals:   02/11/12 1604  BP: 106/70  Pulse: 123  Height: 5\' 5"  (1.651 m)  Weight: 180 lb (81.647 kg)  SpO2: 99%    GEN- The patient is elderly appearing, alert and oriented x 3 today.   Head- normocephalic, atraumatic Eyes-  Sclera clear, conjunctiva pink Ears- hearing intact Oropharynx- clear Neck- supple, no JVP Lymph- no cervical lymphadenopathy Lungs- Clear to ausculation bilaterally, normal work of breathing Heart- irregular rate and rhythm, no murmurs, rubs or gallops,  GI- soft, NT, ND, + BS Extremities- no clubbing, cyanosis, 1+ pedal edema  ekg today reveals afib, with V rates 120s, nonspecific ST/ T changes  Assessment and Plan:

## 2012-02-19 ENCOUNTER — Ambulatory Visit (INDEPENDENT_AMBULATORY_CARE_PROVIDER_SITE_OTHER): Payer: Medicare PPO

## 2012-02-19 DIAGNOSIS — Z23 Encounter for immunization: Secondary | ICD-10-CM

## 2012-02-20 DIAGNOSIS — Z23 Encounter for immunization: Secondary | ICD-10-CM

## 2012-02-29 ENCOUNTER — Ambulatory Visit (INDEPENDENT_AMBULATORY_CARE_PROVIDER_SITE_OTHER): Payer: Medicare PPO | Admitting: *Deleted

## 2012-02-29 VITALS — BP 104/80 | HR 100 | Ht 65.0 in | Wt 178.5 lb

## 2012-02-29 DIAGNOSIS — I4891 Unspecified atrial fibrillation: Secondary | ICD-10-CM

## 2012-02-29 NOTE — Progress Notes (Signed)
Patient came in for an EKG. PT WAS PUT ON DILTIAZEN 120 MG PO DAILY . On her last visit pt's heart rate was 123 beats/minute. According to pt Dr. Johney Frame prescribed Diltazem 120 MG ONCE A DAY TO DECREASED THE HEART RATE. Pt states when she got home after the OV, she realized that she had missed  2 to 3 days Metoprolol doses  so she started taken the metoprolol and  did not start the Diltiazem medication. Pt's EKG done per nurse and read per Dr. Ladona Ridgel MD Atrial Fibrillation rate of  100 beats/minute. Pt.  did not call the office to let MD's nurse know. I spoke with Dennis Bast RN know . Kelly recommended for pt to start the Diltiazem medication and come to the office in a week for an EKG

## 2012-02-29 NOTE — Patient Instructions (Signed)
Patient is to start take he Cardizem CD 120 mg one tablet by mouth daily prescribed by Dr. Johney Frame on last OV 02/11/11 Your physician recommends that you schedule a follow-up appointment in:  For an EKG on 03/07/12 at 9:00 AM.

## 2012-03-03 ENCOUNTER — Other Ambulatory Visit: Payer: Self-pay | Admitting: *Deleted

## 2012-03-03 MED ORDER — NITROGLYCERIN 0.4 MG SL SUBL: 0.4000 mg | SUBLINGUAL_TABLET | SUBLINGUAL | Status: DC | PRN

## 2012-03-07 ENCOUNTER — Ambulatory Visit (INDEPENDENT_AMBULATORY_CARE_PROVIDER_SITE_OTHER): Payer: Medicare PPO

## 2012-03-07 DIAGNOSIS — I4891 Unspecified atrial fibrillation: Secondary | ICD-10-CM

## 2012-03-07 NOTE — Progress Notes (Signed)
Pt. arrives in office for an EKG  At last OV with Dr. Johney Frame on 10/24 pt. was advised to start taking Diltiazem CP 120 mg daily due to elevated V rates.  Pt. c/o fatigue, weakness and sob since starting Diltiazem.  Pt. advised to continue current medical treatment and that we will contact her with Dr. Jenel Lucks recommendations if any. Pt. has f/u scheduled with Dr. Johney Frame on 12-6.

## 2012-03-19 NOTE — Addendum Note (Signed)
Addended by: Early Chars on: 03/19/2012 11:04 AM   Modules accepted: Orders

## 2012-03-28 ENCOUNTER — Ambulatory Visit (INDEPENDENT_AMBULATORY_CARE_PROVIDER_SITE_OTHER): Payer: Medicare PPO | Admitting: Internal Medicine

## 2012-03-28 ENCOUNTER — Encounter: Payer: Self-pay | Admitting: Internal Medicine

## 2012-03-28 ENCOUNTER — Ambulatory Visit (INDEPENDENT_AMBULATORY_CARE_PROVIDER_SITE_OTHER): Payer: Medicare PPO | Admitting: *Deleted

## 2012-03-28 VITALS — BP 108/70 | HR 98 | Ht 65.0 in | Wt 183.0 lb

## 2012-03-28 DIAGNOSIS — I4891 Unspecified atrial fibrillation: Secondary | ICD-10-CM

## 2012-03-28 DIAGNOSIS — Z7901 Long term (current) use of anticoagulants: Secondary | ICD-10-CM

## 2012-03-28 MED ORDER — DILTIAZEM HCL ER COATED BEADS 360 MG PO CP24: 360.0000 mg | ORAL_CAPSULE | Freq: Every day | ORAL | Status: DC

## 2012-03-28 NOTE — Patient Instructions (Addendum)
Your physician recommends that you schedule a follow-up appointment in: 3 months with Dr Johney Frame   Your physician has recommended you make the following change in your medication:  1) increase your Cardizem to 360mg  daily---take what you have now until its all gone--take 2 every morning and one in the pm

## 2012-03-30 NOTE — Progress Notes (Signed)
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  She is doing very well. She maintains an active lifestyle.  She does not feel that her afib significant reduces her quality of life.  She is appropriately anticoagulated with coumadin.  Rate control is better.  Today, she denies symptoms of chest pain, shortness of breath, orthopnea, PND, lower extremity edema, dizziness, presyncope, syncope, or neurologic sequela. The patient is tolerating medications without difficulties and is otherwise without complaint today.   Past Medical History  Diagnosis Date  . DIABETES MELLITUS 09/24/2008  . HYPERLIPIDEMIA 09/24/2008  . HYPERTENSION 09/24/2008  . MI 09/24/2008  . CAD 09/24/2008    [facility] in 1998 tx with PCI to LAD;  myoview 1/12:  no ischemia, EF 45%  . CARDIOMYOPATHY 09/24/2008    ischemic;  echo 4/12:  EF 20-25%, mild MR, mod LAE, mod reduced RVSF, mod RVE, mild RAE, mild TR, PASP 40  . Persistent atrial fibrillation 09/24/2008    s/p DCCV x 4 in past;  Amiod d/c'd 2/2 abnormal PFTs;  Tikosyn load 6/12 with DCCV  . Edema 09/24/2008  . CAROTID BRUIT 09/24/2008  . Anemia   . GERD (gastroesophageal reflux disease)   . Osteopenia    Past Surgical History  Procedure Date  . Fracture surgery 2006    hip  . Knee surgery 2008    broken knee    Current Outpatient Prescriptions  Medication Sig Dispense Refill  . aspirin 81 MG EC tablet Take 81 mg by mouth daily.        Marland Kitchen atorvastatin (LIPITOR) 10 MG tablet Take 1 tablet (10 mg total) by mouth daily.  90 tablet  3  . Blood Glucose Monitoring Suppl (ACCU-CHEK ACTIVE CARE KIT) KIT Disp 11/06/10  1 each  0  . Calcium Carbonate-Vitamin D (CALCIUM 500 + D) 500-125 MG-UNIT TABS Take 1 tablet by mouth 2 (two) times daily.       . Cholecalciferol (VITAMIN D-3 PO) Take by mouth daily.        Marland Kitchen diltiazem (CARDIZEM CD)  360 MG 24 hr capsule Take 1 capsule (360 mg total) by mouth daily.  90 capsule  3  . dofetilide (TIKOSYN) 250 MCG capsule Take 1 capsule (250 mcg total) by mouth 2 (two) times daily.  60 capsule  5  . furosemide (LASIX) 40 MG tablet Take 1 tablet (40 mg total) by mouth daily. As needed  30 tablet  3  . gabapentin (NEURONTIN) 100 MG capsule Take 100 mg by mouth as needed.        . metoprolol (LOPRESSOR) 100 MG tablet Take 1 tablet (100 mg total) by mouth 2 (two) times daily.  90 tablet  11  . Multiple Vitamin (MULTIVITAMIN PO) Take 1 tablet by mouth daily.        . niacin (NIASPAN) 500 MG CR tablet Take 1 tablet (500 mg total) by mouth 2 (two) times daily.  180 tablet  3  . nitroGLYCERIN (NITROSTAT) 0.4 MG SL tablet Place 1 tablet (0.4 mg total) under the tongue every 5 (five) minutes as needed.  25 tablet  12  . potassium chloride (MICRO-K) 10 MEQ CR capsule Take 1 capsule (10 mEq total) by mouth daily. As needed  30 capsule  3  . ramipril (ALTACE) 10 MG capsule Take 1 capsule (10 mg total) by mouth  daily.  90 capsule  3  . vitamin E 100 UNIT capsule Take 100 Units by mouth daily.        Marland Kitchen warfarin (COUMADIN) 2.5 MG tablet TAKE AS DIRECTED BY ANTICOAGULATION CLINIC.  30 tablet  3    Allergies  Allergen Reactions  . Simvastatin     REACTION: rash    History   Social History  . Marital Status: Widowed    Spouse Name: N/A    Number of Children: N/A  . Years of Education: N/A   Occupational History  . Not on file.   Social History Main Topics  . Smoking status: Former Smoker -- 0.5 packs/day for 20 years    Types: Cigarettes    Quit date: 07/11/1996  . Smokeless tobacco: Not on file  . Alcohol Use: No  . Drug Use: No  . Sexually Active: Not on file   Other Topics Concern  . Not on file   Social History Narrative  . No narrative on file    Family History  Problem Relation Age of Onset  . Cancer Mother     breast  . Heart disease Mother   . Diabetes Mother   . Alcohol  abuse Father   . Cancer Sister     breast  . Alcohol abuse Brother   . Coronary artery disease    . Hypertension     Physical Exam: Filed Vitals:   03/28/12 1449  BP: 108/70  Pulse: 98  Height: 5\' 5"  (1.651 m)  Weight: 183 lb (83.008 kg)  SpO2: 97%    GEN- The patient is elderly appearing, alert and oriented x 3 today.   Head- normocephalic, atraumatic Eyes-  Sclera clear, conjunctiva pink Ears- hearing intact Oropharynx- clear Neck- supple, no JVP Lymph- no cervical lymphadenopathy Lungs- Clear to ausculation bilaterally, normal work of breathing Heart- irregular rate and rhythm, no murmurs, rubs or gallops,  GI- soft, NT, ND, + BS Extremities- no clubbing, cyanosis, 1+ pedal edema  ekg today reveals afib, with V rates 98 bpm, nonspecific ST/ T changes  Assessment and Plan:

## 2012-03-30 NOTE — Assessment & Plan Note (Addendum)
Increase diltiazem CD to 360mg  daily Continue coumadin  Return in 3 months for further management

## 2012-04-03 ENCOUNTER — Ambulatory Visit (INDEPENDENT_AMBULATORY_CARE_PROVIDER_SITE_OTHER): Payer: Medicare PPO | Admitting: Family Medicine

## 2012-04-03 ENCOUNTER — Encounter: Payer: Self-pay | Admitting: Family Medicine

## 2012-04-03 VITALS — BP 120/68 | Temp 97.6°F | Wt 186.0 lb

## 2012-04-03 DIAGNOSIS — E785 Hyperlipidemia, unspecified: Secondary | ICD-10-CM

## 2012-04-03 DIAGNOSIS — I10 Essential (primary) hypertension: Secondary | ICD-10-CM

## 2012-04-03 DIAGNOSIS — I4891 Unspecified atrial fibrillation: Secondary | ICD-10-CM

## 2012-04-03 DIAGNOSIS — E119 Type 2 diabetes mellitus without complications: Secondary | ICD-10-CM

## 2012-04-03 DIAGNOSIS — E7800 Pure hypercholesterolemia, unspecified: Secondary | ICD-10-CM

## 2012-04-03 LAB — LIPID PANEL
Cholesterol: 139 mg/dL (ref 0–200)
HDL: 53.9 mg/dL (ref >39.00)
Total CHOL/HDL Ratio: 3
Triglycerides: 62 mg/dL (ref 0.0–149.0)

## 2012-04-03 LAB — HEPATIC FUNCTION PANEL
ALT: 14 U/L (ref 0–35)
AST: 19 U/L (ref 0–37)
Albumin: 3.9 g/dL (ref 3.5–5.2)
Total Protein: 7 g/dL (ref 6.0–8.3)

## 2012-04-03 LAB — BASIC METABOLIC PANEL
Calcium: 9.2 mg/dL (ref 8.4–10.5)
GFR: 98.31 mL/min (ref >60.00)
Sodium: 136 mEq/L (ref 135–145)

## 2012-04-03 LAB — HEMOGLOBIN A1C: Hgb A1c MFr Bld: 6.2 % (ref 4.6–6.5)

## 2012-04-03 NOTE — Progress Notes (Signed)
  Subjective:    Patient ID: [name], female    DOB: 12-28-1929, 78 y.o.   MRN: [id number]  HPI  Medical follow up:  Atrial fibrillation.  Recent increase in diltiazem.  No dizziness.  No chest pain.  On coumadin and recent INR stable.  No bleeding complications  Hyperlipidemia.  Niacin-increased cost.  Wants to stop.  Only taking one half Lipitor per day.  No side effects.    Diabetes.  Not monitor.  No symptoms of hyperglycemia.  Last A1C 5.9%.  Hypertension well controlled.  Compliant with medications.  Past Medical History  Diagnosis Date  . DIABETES MELLITUS 09/24/2008  . HYPERLIPIDEMIA 09/24/2008  . HYPERTENSION 09/24/2008  . MI 09/24/2008  . CAD 09/24/2008    [facility] in 1998 tx with PCI to LAD;  myoview 1/12:  no ischemia, EF 45%  . CARDIOMYOPATHY 09/24/2008    ischemic;  echo 4/12:  EF 20-25%, mild MR, mod LAE, mod reduced RVSF, mod RVE, mild RAE, mild TR, PASP 40  . Persistent atrial fibrillation 09/24/2008    s/p DCCV x 4 in past;  Amiod d/c'd 2/2 abnormal PFTs;  Tikosyn load 6/12 with DCCV  . Edema 09/24/2008  . CAROTID BRUIT 09/24/2008  . Anemia   . GERD (gastroesophageal reflux disease)   . Osteopenia    Past Surgical History  Procedure Date  . Fracture surgery 2006    hip  . Knee surgery 2008    broken knee    reports that she quit smoking about 15 years ago. Her smoking use included Cigarettes. She has a 10 pack-year smoking history. She does not have any smokeless tobacco history on file. She reports that she does not drink alcohol or use illicit drugs. family history includes Alcohol abuse in her brother and father; Cancer in her mother and sister; Coronary artery disease in an unspecified family member; Diabetes in her mother; Heart disease in her mother; and Hypertension in an unspecified family member. Allergies  Allergen Reactions  . Simvastatin     REACTION: rash        Review of Systems  Constitutional: Negative for fatigue.  Eyes: Negative for visual  disturbance.  Respiratory: Negative for cough, chest tightness, shortness of breath and wheezing.   Cardiovascular: Negative for chest pain, palpitations and leg swelling.  Neurological: Negative for dizziness, seizures, syncope, weakness, light-headedness and headaches.       Objective:   Physical Exam  Constitutional: She appears well-developed and well-nourished.  Neck: Neck supple. No thyromegaly present.  Cardiovascular: Normal rate and regular rhythm.   Pulmonary/Chest: Effort normal and breath sounds normal. No respiratory distress. She has no wheezes. She has no rales.  Musculoskeletal:       Only very trace edema legs bilaterally          Assessment & Plan:  #1 history of atrial fibrillation. Is in sinus rhythm today with rate around 60. Daughter will continue to monitor her heart rate #2 type 2 diabetes. History of excellent control. Recheck A1c #3 history of hyperlipidemia. Patient requesting stopping niacin because of cost. She will d/c niacin and increase Lipitor to 10 mg daily. Currently taking only 5 mg daily. Recheck lipids in 6 months  #4 hypertension. Well controlled. Continue current medications

## 2012-04-03 NOTE — Patient Instructions (Addendum)
Stop Niacin Increase Lipitor to one full tablet daily

## 2012-04-04 NOTE — Progress Notes (Signed)
Quick Note:  Pt informed on home VM ______ 

## 2012-04-22 ENCOUNTER — Other Ambulatory Visit: Payer: Self-pay | Admitting: *Deleted

## 2012-04-22 MED ORDER — DOFETILIDE 250 MCG PO CAPS: 250.0000 ug | ORAL_CAPSULE | Freq: Two times a day (BID) | ORAL | Status: DC

## 2012-05-09 ENCOUNTER — Ambulatory Visit (INDEPENDENT_AMBULATORY_CARE_PROVIDER_SITE_OTHER): Payer: Medicare Other | Admitting: *Deleted

## 2012-05-09 DIAGNOSIS — I4891 Unspecified atrial fibrillation: Secondary | ICD-10-CM

## 2012-05-09 DIAGNOSIS — Z7901 Long term (current) use of anticoagulants: Secondary | ICD-10-CM

## 2012-05-09 LAB — POCT INR: INR: 2.1

## 2012-06-02 ENCOUNTER — Telehealth: Payer: Self-pay | Admitting: Cardiovascular Disease

## 2012-06-02 NOTE — Telephone Encounter (Signed)
NEED TO CALL  [phone]  TO POSSIBLY GET PRIOR AUTH  FOR NIACIN .[name]

## 2012-06-02 NOTE — Telephone Encounter (Signed)
New problem   Need to discuss niacin will not be able to get anymore from insurance company.

## 2012-06-17 ENCOUNTER — Encounter: Payer: Self-pay | Admitting: Family Medicine

## 2012-06-17 ENCOUNTER — Ambulatory Visit (INDEPENDENT_AMBULATORY_CARE_PROVIDER_SITE_OTHER): Payer: Medicare Other | Admitting: Family Medicine

## 2012-06-17 VITALS — BP 140/88 | Temp 97.6°F

## 2012-06-17 DIAGNOSIS — S9032XA Contusion of left foot, initial encounter: Secondary | ICD-10-CM

## 2012-06-17 DIAGNOSIS — S9030XA Contusion of unspecified foot, initial encounter: Secondary | ICD-10-CM

## 2012-06-17 MED ORDER — ACCU-CHEK ACTIVE CARE KIT KIT: PACK | Status: DC

## 2012-06-17 MED ORDER — GLUCOSE BLOOD VI STRP: ORAL_STRIP | Status: DC

## 2012-06-17 MED ORDER — NIACIN ER (ANTIHYPERLIPIDEMIC) 500 MG PO TBCR: 500.0000 mg | EXTENDED_RELEASE_TABLET | Freq: Two times a day (BID) | ORAL | Status: DC

## 2012-06-17 NOTE — Progress Notes (Signed)
  Subjective:    Patient ID: [name], female    DOB: Sep 05, 1929, 79 y.o.   MRN: [id number]  HPI Left foot pain.  2 weeks ago dropped small log on foot.  Had some immediate swelling but able to ambulate She continued to have swelling last week went to local urgent care. X-rays reportedly negative. She describes what sounds like hematoma immediately following her injury. She's had some bruising since then.  Apparently had some mild erythema last week in urgent care and started Keflex 500 mg 3 times a day. No fevers or chills. No obvious breaks in skin. She has been still using ice. Elevating frequently. Ambulating without much difficulty.   Review of Systems  Constitutional: Negative for fever and chills.       Objective:   Physical Exam  Constitutional: She appears well-developed and well-nourished.  Cardiovascular: Normal rate and regular rhythm.   Pulmonary/Chest: Effort normal and breath sounds normal.  Musculoskeletal:  Left foot reveals obvious swelling mid aspect of foot. She has some fluctuant fluid there. She has diffuse ecchymosis dorsum of foot. Mild local tenderness. No pustules. Minimal to any warmth to palpation.          Assessment & Plan:  Hematoma left foot. Patient started recently per urgent care on antibiotics for presumed mild cellulitis. Do not think her swelling represents abscess at this time. Continue elevation. Avoid icing at this point. Try low-grade heat. Reassess one week and immediately if she has an increased redness or pain

## 2012-06-17 NOTE — Patient Instructions (Signed)
Elevate foot frequently. Finish out antibiotic. Follow up promptly for any fever or increased swelling or redness.

## 2012-06-20 ENCOUNTER — Telehealth: Payer: Self-pay | Admitting: *Deleted

## 2012-06-20 ENCOUNTER — Ambulatory Visit (INDEPENDENT_AMBULATORY_CARE_PROVIDER_SITE_OTHER): Payer: Medicare Other | Admitting: *Deleted

## 2012-06-20 DIAGNOSIS — Z7901 Long term (current) use of anticoagulants: Secondary | ICD-10-CM

## 2012-06-20 DIAGNOSIS — I4891 Unspecified atrial fibrillation: Secondary | ICD-10-CM

## 2012-06-20 MED ORDER — WARFARIN SODIUM 2.5 MG PO TABS: 2.5000 mg | ORAL_TABLET | ORAL | Status: DC

## 2012-06-20 NOTE — Telephone Encounter (Signed)
Yes we should make this change and refill once

## 2012-06-20 NOTE — Telephone Encounter (Signed)
This pt does not take Hydrocodone, entered refill request into the wrong pt chart.

## 2012-06-20 NOTE — Telephone Encounter (Signed)
Hydrocodone [phone] BID pain PRN last filled 03-17-12 #120 with 0 refills Should this be changed to 10-325 mg?

## 2012-06-24 ENCOUNTER — Ambulatory Visit: Payer: Medicare Other | Admitting: Family Medicine

## 2012-07-01 ENCOUNTER — Ambulatory Visit (INDEPENDENT_AMBULATORY_CARE_PROVIDER_SITE_OTHER): Payer: Medicare Other | Admitting: Family Medicine

## 2012-07-01 ENCOUNTER — Encounter: Payer: Self-pay | Admitting: Family Medicine

## 2012-07-01 VITALS — BP 110/70 | Temp 97.7°F | Wt 188.0 lb

## 2012-07-01 DIAGNOSIS — S9032XD Contusion of left foot, subsequent encounter: Secondary | ICD-10-CM

## 2012-07-01 DIAGNOSIS — Z5189 Encounter for other specified aftercare: Secondary | ICD-10-CM

## 2012-07-01 NOTE — Patient Instructions (Addendum)
Hematoma  A hematoma is a pocket of blood that collects under the skin, in an organ, in a body space, in a joint space, or in other tissue. The blood can clot to form a lump that you can see and feel. The lump is often firm, sore, and sometimes even painful and tender. Most hematomas get better in a few days to weeks. However, some hematomas may be serious and require medical care.Hematomas can range in size from very small to very large.  CAUSES   A hematoma can be caused by a blunt or penetrating injury. It can also be caused by leakage from a blood vessel under the skin. Spontaneous leakage from a blood vessel is more likely to occur in elderly people, especially those taking blood thinners. Sometimes, a hematoma can develop after certain medical procedures.  SYMPTOMS   Unlike a bruise, a hematoma forms a firm lump that you can feel. This lump is the collection of blood. The collection of blood can also cause your skin to turn a blue to dark blue color. If the hematoma is close to the surface of the skin, it often produces a yellowish color in the skin.  DIAGNOSIS   Your caregiver can determine whether you have a hematoma based on your history and a physical exam.  TREATMENT   Hematomas usually go away on their own over time. Rarely does the blood need to be drained out of the body.  HOME CARE INSTRUCTIONS    Put ice on the injured area.   Put ice in a plastic bag.   Place a towel between your skin and the bag.   Leave the ice on for 15 to 20 minutes, 3 to 4 times a day for the first 1 to 2 days.   After the first 2 days, switch to using warm compresses on the hematoma.   Elevate the injured area to help decrease pain and swelling. Wrapping the area with an elastic bandage may also be helpful. Compression helps to reduce swelling and promotes shrinking of the hematoma. Make sure the bandage is not wrapped too tight.   If your hematoma is on a lower extremity and is painful, crutches may be helpful for a  couple days.   Only take over-the-counter or prescription medicines for pain, discomfort, or fever as directed by your caregiver. Most patients can take acetaminophen or ibuprofen for the pain.  SEEK IMMEDIATE MEDICAL CARE IF:    You have increasing pain, or your pain is not controlled with medicine.   You have a fever.   You have worsening swelling or discoloration.   Your skin over the hematoma breaks or starts bleeding.  MAKE SURE YOU:    Understand these instructions.   Will watch your condition.   Will get help right away if you are not doing well or get worse.  Document Released: 11/22/2003 Document Revised: 07/02/2011 Document Reviewed: 12/11/2010  [employer] Patient Information 2013 [employer].

## 2012-07-01 NOTE — Progress Notes (Signed)
  Subjective:    Patient ID: [name], female    DOB: 14-Apr-1930, 79 y.o.   MRN: [id number]  HPI Followup from left foot injury Refer to prior note. Dropped log on foot X-rays reportedly negative for fracture from urgent care We had concerns about possible early cellulitis and she was already on Keflex Foot has improved. Still has swelling but no pain. Ambulating well. No fevers or chills. Mild erythema has resolved  Past Medical History  Diagnosis Date  . DIABETES MELLITUS 09/24/2008  . HYPERLIPIDEMIA 09/24/2008  . HYPERTENSION 09/24/2008  . MI 09/24/2008  . CAD 09/24/2008    [facility] in 1998 tx with PCI to LAD;  myoview 1/12:  no ischemia, EF 45%  . CARDIOMYOPATHY 09/24/2008    ischemic;  echo 4/12:  EF 20-25%, mild MR, mod LAE, mod reduced RVSF, mod RVE, mild RAE, mild TR, PASP 40  . Persistent atrial fibrillation 09/24/2008    s/p DCCV x 4 in past;  Amiod d/c'd 2/2 abnormal PFTs;  Tikosyn load 6/12 with DCCV  . Edema 09/24/2008  . CAROTID BRUIT 09/24/2008  . Anemia   . GERD (gastroesophageal reflux disease)   . Osteopenia    Past Surgical History  Procedure Laterality Date  . Fracture surgery  2006    hip  . Knee surgery  2008    broken knee    reports that she quit smoking about 15 years ago. Her smoking use included Cigarettes. She has a 10 pack-year smoking history. She does not have any smokeless tobacco history on file. She reports that she does not drink alcohol or use illicit drugs. family history includes Alcohol abuse in her brother and father; Cancer in her mother and sister; Coronary artery disease in an unspecified family member; Diabetes in her mother; Heart disease in her mother; and Hypertension in an unspecified family member. Allergies  Allergen Reactions  . Simvastatin     REACTION: rash      Review of Systems  Constitutional: Negative for fever and chills.       Objective:   Physical Exam  Constitutional: She appears well-developed and well-nourished.   Cardiovascular: Normal rate.   Irregularly regular rhythm but rate controlled  Pulmonary/Chest: Effort normal and breath sounds normal. No respiratory distress. She has no wheezes. She has no rales.  Musculoskeletal:  Left foot reveals small hematoma dorsum of foot. Nontender to palpation. No warmth. No erythema. No bony tenderness          Assessment & Plan:  Contusion/hematoma left foot. Improving. No signs of cellulitis at this time. Explained this may take several more weeks to fully resolve

## 2012-07-11 ENCOUNTER — Ambulatory Visit (INDEPENDENT_AMBULATORY_CARE_PROVIDER_SITE_OTHER): Payer: Medicare Other | Admitting: *Deleted

## 2012-07-11 DIAGNOSIS — Z7901 Long term (current) use of anticoagulants: Secondary | ICD-10-CM

## 2012-07-11 DIAGNOSIS — I4891 Unspecified atrial fibrillation: Secondary | ICD-10-CM

## 2012-07-22 NOTE — Telephone Encounter (Signed)
PRIOR AUTH FORM RECEIVED AND FILLED OUT AND FAXED  BACK  TO [employer]

## 2012-07-22 NOTE — Telephone Encounter (Signed)
PER PT HAS STOPPED  NIACIN   WILL TRY AND CALL [employer]  TO GET PRIOR AUTH FORM

## 2012-07-23 ENCOUNTER — Telehealth: Payer: Self-pay | Admitting: Cardiovascular Disease

## 2012-07-23 NOTE — Telephone Encounter (Signed)
Notification from [employer] that Niacin has been approved through 4/15

## 2012-07-23 NOTE — Telephone Encounter (Signed)
New problem     The request for the Niacin ER good thru 4/15.

## 2012-07-25 NOTE — Telephone Encounter (Signed)
LMTCB ./[facility] 

## 2012-07-29 NOTE — Telephone Encounter (Signed)
PT AWARE NIACIN HAS BEEN APPROVED .[name]

## 2012-07-30 NOTE — Telephone Encounter (Signed)
See other phone note ./[facility]

## 2012-07-31 ENCOUNTER — Ambulatory Visit (INDEPENDENT_AMBULATORY_CARE_PROVIDER_SITE_OTHER): Payer: Medicare Other

## 2012-07-31 DIAGNOSIS — I4891 Unspecified atrial fibrillation: Secondary | ICD-10-CM

## 2012-07-31 DIAGNOSIS — Z7901 Long term (current) use of anticoagulants: Secondary | ICD-10-CM

## 2012-08-28 ENCOUNTER — Ambulatory Visit (INDEPENDENT_AMBULATORY_CARE_PROVIDER_SITE_OTHER): Payer: Medicare Other | Admitting: *Deleted

## 2012-08-28 DIAGNOSIS — I4891 Unspecified atrial fibrillation: Secondary | ICD-10-CM

## 2012-08-28 DIAGNOSIS — Z7901 Long term (current) use of anticoagulants: Secondary | ICD-10-CM

## 2012-08-28 LAB — POCT INR: INR: 1.4

## 2012-09-09 ENCOUNTER — Ambulatory Visit (INDEPENDENT_AMBULATORY_CARE_PROVIDER_SITE_OTHER): Payer: Medicare Other

## 2012-09-09 DIAGNOSIS — Z7901 Long term (current) use of anticoagulants: Secondary | ICD-10-CM

## 2012-09-09 DIAGNOSIS — I4891 Unspecified atrial fibrillation: Secondary | ICD-10-CM

## 2012-09-09 LAB — POCT INR: INR: 1.4

## 2012-09-19 ENCOUNTER — Ambulatory Visit (INDEPENDENT_AMBULATORY_CARE_PROVIDER_SITE_OTHER): Payer: Medicare Other

## 2012-09-19 DIAGNOSIS — I4891 Unspecified atrial fibrillation: Secondary | ICD-10-CM

## 2012-09-19 DIAGNOSIS — Z7901 Long term (current) use of anticoagulants: Secondary | ICD-10-CM

## 2012-09-19 LAB — POCT INR: INR: 1.7

## 2012-10-02 ENCOUNTER — Encounter: Payer: Self-pay | Admitting: Family Medicine

## 2012-10-02 ENCOUNTER — Ambulatory Visit (INDEPENDENT_AMBULATORY_CARE_PROVIDER_SITE_OTHER): Payer: Medicare Other | Admitting: Family Medicine

## 2012-10-02 VITALS — BP 130/78 | HR 52 | Temp 98.2°F | Wt 194.0 lb

## 2012-10-02 DIAGNOSIS — I10 Essential (primary) hypertension: Secondary | ICD-10-CM

## 2012-10-02 DIAGNOSIS — I4891 Unspecified atrial fibrillation: Secondary | ICD-10-CM

## 2012-10-02 DIAGNOSIS — E785 Hyperlipidemia, unspecified: Secondary | ICD-10-CM

## 2012-10-02 NOTE — Patient Instructions (Addendum)
Watch sodium intake Elevate legs frequently  We need for you to lose some weight.

## 2012-10-02 NOTE — Progress Notes (Signed)
  Subjective:    Patient ID: [name], female    DOB: 11-15-1929, 79 y.o.   MRN: [id number]  HPI Routine followup Patient history type 2 diabetes, history of CAD, hypertension, atrial fibrillation, dyslipidemia. She takes Cardizem with recent increase in dosage of 360 mg. Since that time, she's complained of some intermittent dizziness and fatigue. She apparently resumed her dosage of 240 mg recently on her own.  She takes Lasix infrequently for peripheral edema issues. No recent increased dyspnea. No chest pains. Current medications reviewed and compliant with all. She remains on Coumadin. No recent bleeding complications.  Past Medical History  Diagnosis Date  . DIABETES MELLITUS 09/24/2008  . HYPERLIPIDEMIA 09/24/2008  . HYPERTENSION 09/24/2008  . MI 09/24/2008  . CAD 09/24/2008    [facility] in 1998 tx with PCI to LAD;  myoview 1/12:  no ischemia, EF 45%  . CARDIOMYOPATHY 09/24/2008    ischemic;  echo 4/12:  EF 20-25%, mild MR, mod LAE, mod reduced RVSF, mod RVE, mild RAE, mild TR, PASP 40  . Persistent atrial fibrillation 09/24/2008    s/p DCCV x 4 in past;  Amiod d/c'd 2/2 abnormal PFTs;  Tikosyn load 6/12 with DCCV  . Edema 09/24/2008  . CAROTID BRUIT 09/24/2008  . Anemia   . GERD (gastroesophageal reflux disease)   . Osteopenia    Past Surgical History  Procedure Laterality Date  . Fracture surgery  2006    hip  . Knee surgery  2008    broken knee    reports that she quit smoking about 16 years ago. Her smoking use included Cigarettes. She has a 10 pack-year smoking history. She does not have any smokeless tobacco history on file. She reports that she does not drink alcohol or use illicit drugs. family history includes Alcohol abuse in her brother and father; Cancer in her mother and sister; Coronary artery disease in an unspecified family member; Diabetes in her mother; Heart disease in her mother; and Hypertension in an unspecified family member. Allergies  Allergen Reactions  .  Simvastatin     REACTION: rash      Review of Systems  Constitutional: Negative for fatigue.  Eyes: Negative for visual disturbance.  Respiratory: Negative for cough, chest tightness, shortness of breath and wheezing.   Cardiovascular: Negative for chest pain, palpitations and leg swelling.  Neurological: Negative for dizziness, seizures, syncope, weakness, light-headedness and headaches.       Objective:   Physical Exam  Constitutional: She appears well-developed and well-nourished.  Neck: Neck supple. No thyromegaly present.  Cardiovascular: Regular rhythm.   Rate of 52  Pulmonary/Chest: Effort normal and breath sounds normal. No respiratory distress. She has no wheezes. She has no rales.  Musculoskeletal:  Only trace edema legs bilaterally  Neurological: She is alert.          Assessment & Plan:  #1 hypertension. Stable #2 atrial fibrillation. Appears to be in regular rhythm today. Rate controlled #3 hyperlipidemia

## 2012-10-05 ENCOUNTER — Other Ambulatory Visit: Payer: Self-pay | Admitting: Internal Medicine

## 2012-10-06 ENCOUNTER — Ambulatory Visit (INDEPENDENT_AMBULATORY_CARE_PROVIDER_SITE_OTHER): Payer: Medicare Other

## 2012-10-06 DIAGNOSIS — I4891 Unspecified atrial fibrillation: Secondary | ICD-10-CM

## 2012-10-06 DIAGNOSIS — Z7901 Long term (current) use of anticoagulants: Secondary | ICD-10-CM

## 2012-10-06 LAB — POCT INR: INR: 2.2

## 2012-10-23 ENCOUNTER — Ambulatory Visit (INDEPENDENT_AMBULATORY_CARE_PROVIDER_SITE_OTHER): Payer: Medicare Other | Admitting: *Deleted

## 2012-10-23 ENCOUNTER — Ambulatory Visit (INDEPENDENT_AMBULATORY_CARE_PROVIDER_SITE_OTHER): Payer: Medicare Other | Admitting: Internal Medicine

## 2012-10-23 ENCOUNTER — Encounter: Payer: Self-pay | Admitting: Internal Medicine

## 2012-10-23 VITALS — BP 132/88 | HR 91 | Ht 67.0 in | Wt 193.0 lb

## 2012-10-23 DIAGNOSIS — I10 Essential (primary) hypertension: Secondary | ICD-10-CM

## 2012-10-23 DIAGNOSIS — Z7901 Long term (current) use of anticoagulants: Secondary | ICD-10-CM

## 2012-10-23 DIAGNOSIS — I428 Other cardiomyopathies: Secondary | ICD-10-CM

## 2012-10-23 DIAGNOSIS — R42 Dizziness and giddiness: Secondary | ICD-10-CM

## 2012-10-23 DIAGNOSIS — I5022 Chronic systolic (congestive) heart failure: Secondary | ICD-10-CM

## 2012-10-23 DIAGNOSIS — I4891 Unspecified atrial fibrillation: Secondary | ICD-10-CM

## 2012-10-23 LAB — POCT INR: INR: 1.9

## 2012-10-23 NOTE — Patient Instructions (Addendum)
Your physician has recommended that you have a LINQ monitor inserted  Okay to have a clear liquid breakfast  Nothing after 7am  Go to [facility] [employer] ---- Be there at 1:00pm

## 2012-10-28 ENCOUNTER — Encounter (HOSPITAL_COMMUNITY): Payer: Self-pay | Admitting: Pharmacy Technician

## 2012-11-02 DIAGNOSIS — I5022 Chronic systolic (congestive) heart failure: Secondary | ICD-10-CM | POA: Insufficient documentation

## 2012-11-02 DIAGNOSIS — R42 Dizziness and giddiness: Secondary | ICD-10-CM | POA: Insufficient documentation

## 2012-11-02 NOTE — Progress Notes (Signed)
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 female patient with a h/o persistent atrial fibrillation, CAD, and ischemic CM who presents today for EP follow-up.  She is doing very well. She maintains an active lifestyle.  She does not feel that her afib significant reduces her quality of life.  She is appropriately anticoagulated with coumadin.  Rate control is better. Recently, she has begun having episodes of presyncope.  She reports abrupt onset and offset of dizziness and weakness.  She denies frank syncope.  These episodes are infrequent and occur only every few months.  Today, she denies symptoms of chest pain, shortness of breath, orthopnea, PND, lower extremity edema,  or neurologic sequela. The patient is tolerating medications without difficulties and is otherwise without complaint today.   Past Medical History  Diagnosis Date  . DIABETES MELLITUS 09/24/2008  . HYPERLIPIDEMIA 09/24/2008  . HYPERTENSION 09/24/2008  . MI 09/24/2008  . CAD 09/24/2008    [facility] in 1998 tx with PCI to LAD;  myoview 1/12:  no ischemia, EF 45%  . CARDIOMYOPATHY 09/24/2008    ischemic;  echo 4/12:  EF 20-25%, mild MR, mod LAE, mod reduced RVSF, mod RVE, mild RAE, mild TR, PASP 40  . Persistent atrial fibrillation 09/24/2008    s/p DCCV x 4 in past;  Amiod d/c'd 2/2 abnormal PFTs;  Tikosyn load 6/12 with DCCV  . Edema 09/24/2008  . CAROTID BRUIT 09/24/2008  . Anemia   . GERD (gastroesophageal reflux disease)   . Osteopenia    Past Surgical History  Procedure Laterality Date  . Fracture surgery  2006    hip  . Knee surgery  2008    broken knee    Current Outpatient Prescriptions  Medication Sig Dispense Refill  . aspirin 81 MG EC tablet Take 81 mg by mouth daily.        Marland Kitchen atorvastatin (LIPITOR) 10 MG tablet Take 1 tablet (10 mg total) by mouth daily.  90 tablet  3  . Blood Glucose Monitoring Suppl (ACCU-CHEK ACTIVE CARE KIT) KIT Disp 11/06/10  1 each  0  . Calcium  Carbonate-Vitamin D (CALCIUM 500 + D) 500-125 MG-UNIT TABS Take 1 tablet by mouth daily.       Marland Kitchen diltiazem (CARDIZEM CD) 360 MG 24 hr capsule Take 360 mg by mouth daily. PT is only taking 240mg  once a day      . glucose blood (ACCU-CHEK INSTANT GLUCOSE TEST) test strip Use as instructed checking BS daily  100 each  12  . metoprolol (LOPRESSOR) 100 MG tablet Take 1 tablet (100 mg total) by mouth 2 (two) times daily.  90 tablet  11  . Multiple Vitamin (MULTIVITAMIN PO) Take 1 tablet by mouth daily.        . nitroGLYCERIN (NITROSTAT) 0.4 MG SL tablet Place 1 tablet (0.4 mg total) under the tongue every 5 (five) minutes as needed.  25 tablet  12  . ramipril (ALTACE) 10 MG capsule Take 1 capsule (10 mg total) by mouth daily.  90 capsule  3  . vitamin E 100 UNIT capsule Take 100 Units by mouth daily.        Marland Kitchen dofetilide (TIKOSYN) 250 MCG capsule Take 250 mcg by mouth 2 (two) times daily.      . furosemide (LASIX) 40 MG tablet Take 40 mg by mouth daily as needed for fluid.      . potassium chloride (MICRO-K) 10 MEQ CR capsule  Take 10 mEq by mouth daily as needed (for potassium repacement when taking lasix).      Marland Kitchen warfarin (COUMADIN) 2.5 MG tablet Take 2.5 mg by mouth daily.       No current facility-administered medications for this visit.    Allergies  Allergen Reactions  . Simvastatin     REACTION: rash    History   Social History  . Marital Status: Widowed    Spouse Name: N/A    Number of Children: N/A  . Years of Education: N/A   Occupational History  . Not on file.   Social History Main Topics  . Smoking status: Former Smoker -- 0.50 packs/day for 20 years    Types: Cigarettes    Quit date: 07/11/1996  . Smokeless tobacco: Not on file  . Alcohol Use: No  . Drug Use: No  . Sexually Active: Not on file   Other Topics Concern  . Not on file   Social History Narrative  . No narrative on file    Family History  Problem Relation Age of Onset  . Cancer Mother     breast   . Heart disease Mother   . Diabetes Mother   . Alcohol abuse Father   . Cancer Sister     breast  . Alcohol abuse Brother   . Coronary artery disease    . Hypertension     Physical Exam: Filed Vitals:   10/23/12 1008 10/23/12 1009 10/23/12 1011 10/23/12 1015  BP: 142/90 138/78 132/82 132/88  Pulse: 90 90 92 91  Height:      Weight:      SpO2: 95% 96% 97% 97%    GEN- The patient is elderly appearing, alert and oriented x 3 today.   Head- normocephalic, atraumatic Eyes-  Sclera clear, conjunctiva pink Ears- hearing intact Oropharynx- clear Neck- supple, no JVP Lymph- no cervical lymphadenopathy Lungs- Clear to ausculation bilaterally, normal work of breathing Heart- irregular rate and rhythm, no murmurs, rubs or gallops,  GI- soft, NT, ND, + BS Extremities- no clubbing, cyanosis, 1+ pedal edema  ekg today reveals afib, with V rates 64 bpm, LVH, nonspecific ST/ T changes  Assessment and Plan:  1. Dizziness, presyncope Unclear etiology but possibly arrhythmic in etiology She is clear that she would like to avoid ICD/ PPM if possible.  I think that given her afib, we need to better characterize her rhythm during episodes with monitoring.  It may be that this can help to guide our medical therapies.  Given the infrequent nature of her events, a 30 day monitor is unlikely to capture the events.  Today, I discussed an implantable loop recorder as an option. Risks, benefits, and alternatives to this procedure were discussed at length with the patient who wishes to proceed.  We will therefore proceed with LINQ implantation at the next available time.  I think that this is necessary to better guide our rate control of her afib.  2. Persistent afib Continue anticoagulation  3. HTN Stable No change required today  4. Chronic systolic dysfunction euvolemic today Though she meets criteria for an ICD, she is clear that she would prefer to avoid this at this time.

## 2012-11-04 ENCOUNTER — Ambulatory Visit (INDEPENDENT_AMBULATORY_CARE_PROVIDER_SITE_OTHER): Payer: Medicare Other | Admitting: *Deleted

## 2012-11-04 DIAGNOSIS — Z7901 Long term (current) use of anticoagulants: Secondary | ICD-10-CM

## 2012-11-04 DIAGNOSIS — I4891 Unspecified atrial fibrillation: Secondary | ICD-10-CM

## 2012-11-06 ENCOUNTER — Encounter (HOSPITAL_COMMUNITY)
Admission: RE | Disposition: A | Discharge status: Home / Self Care | Payer: Self-pay | Source: Ambulatory Visit | Attending: Internal Medicine

## 2012-11-06 ENCOUNTER — Ambulatory Visit (HOSPITAL_COMMUNITY)
Admission: RE | Admit: 2012-11-06 | Discharge: 2012-11-06 | Disposition: A | Discharge status: Home / Self Care | Payer: Medicare Other | Source: Ambulatory Visit | Attending: Internal Medicine | Admitting: Internal Medicine

## 2012-11-06 ENCOUNTER — Encounter (HOSPITAL_COMMUNITY): Payer: Self-pay | Admitting: *Deleted

## 2012-11-06 DIAGNOSIS — R42 Dizziness and giddiness: Secondary | ICD-10-CM | POA: Diagnosis present

## 2012-11-06 DIAGNOSIS — I2510 Atherosclerotic heart disease of native coronary artery without angina pectoris: Secondary | ICD-10-CM | POA: Insufficient documentation

## 2012-11-06 DIAGNOSIS — Z7901 Long term (current) use of anticoagulants: Secondary | ICD-10-CM | POA: Insufficient documentation

## 2012-11-06 DIAGNOSIS — Z87891 Personal history of nicotine dependence: Secondary | ICD-10-CM | POA: Insufficient documentation

## 2012-11-06 DIAGNOSIS — I2589 Other forms of chronic ischemic heart disease: Secondary | ICD-10-CM | POA: Insufficient documentation

## 2012-11-06 DIAGNOSIS — Z8249 Family history of ischemic heart disease and other diseases of the circulatory system: Secondary | ICD-10-CM | POA: Insufficient documentation

## 2012-11-06 DIAGNOSIS — I10 Essential (primary) hypertension: Secondary | ICD-10-CM | POA: Insufficient documentation

## 2012-11-06 DIAGNOSIS — Z7982 Long term (current) use of aspirin: Secondary | ICD-10-CM | POA: Insufficient documentation

## 2012-11-06 DIAGNOSIS — M899 Disorder of bone, unspecified: Secondary | ICD-10-CM | POA: Insufficient documentation

## 2012-11-06 DIAGNOSIS — I252 Old myocardial infarction: Secondary | ICD-10-CM | POA: Insufficient documentation

## 2012-11-06 DIAGNOSIS — I519 Heart disease, unspecified: Secondary | ICD-10-CM | POA: Insufficient documentation

## 2012-11-06 DIAGNOSIS — Z79899 Other long term (current) drug therapy: Secondary | ICD-10-CM | POA: Insufficient documentation

## 2012-11-06 DIAGNOSIS — K219 Gastro-esophageal reflux disease without esophagitis: Secondary | ICD-10-CM | POA: Insufficient documentation

## 2012-11-06 DIAGNOSIS — D649 Anemia, unspecified: Secondary | ICD-10-CM | POA: Insufficient documentation

## 2012-11-06 DIAGNOSIS — R55 Syncope and collapse: Secondary | ICD-10-CM | POA: Insufficient documentation

## 2012-11-06 DIAGNOSIS — E785 Hyperlipidemia, unspecified: Secondary | ICD-10-CM | POA: Insufficient documentation

## 2012-11-06 DIAGNOSIS — E119 Type 2 diabetes mellitus without complications: Secondary | ICD-10-CM | POA: Insufficient documentation

## 2012-11-06 DIAGNOSIS — R002 Palpitations: Secondary | ICD-10-CM | POA: Insufficient documentation

## 2012-11-06 DIAGNOSIS — I4891 Unspecified atrial fibrillation: Secondary | ICD-10-CM | POA: Insufficient documentation

## 2012-11-06 DIAGNOSIS — Z888 Allergy status to other drugs, medicaments and biological substances status: Secondary | ICD-10-CM | POA: Insufficient documentation

## 2012-11-06 HISTORY — PX: LOOP RECORDER IMPLANT: SHX5477

## 2012-11-06 LAB — SURGICAL PCR SCREEN
MRSA, PCR: NEGATIVE
Staphylococcus aureus: NEGATIVE

## 2012-11-06 SURGERY — LOOP RECORDER IMPLANT
Anesthesia: LOCAL

## 2012-11-06 MED ORDER — MUPIROCIN 2 % EX OINT
TOPICAL_OINTMENT | CUTANEOUS | Status: AC
  Filled 2012-11-06: qty 22

## 2012-11-06 MED ORDER — LIDOCAINE-EPINEPHRINE 1 %-1:100000 IJ SOLN
INTRAMUSCULAR | Status: AC
  Filled 2012-11-06: qty 1

## 2012-11-06 MED ORDER — MUPIROCIN 2 % EX OINT
TOPICAL_OINTMENT | Freq: Two times a day (BID) | CUTANEOUS | Status: DC
  Filled 2012-11-06: qty 22

## 2012-11-06 NOTE — H&P (View-Only) (Signed)
PCP: [name],BRUCE W, MD Primary Cardiologist:  Dr Nishan  [name] is a 79 y.o. female patient with a h/o persistent atrial fibrillation, CAD, and ischemic CM who presents today for EP follow-up.  She is doing very well. She maintains an active lifestyle.  She does not feel that her afib significant reduces her quality of life.  She is appropriately anticoagulated with coumadin.  Rate control is better. Recently, she has begun having episodes of presyncope.  She reports abrupt onset and offset of dizziness and weakness.  She denies frank syncope.  These episodes are infrequent and occur only every few months.  Today, she denies symptoms of chest pain, shortness of breath, orthopnea, PND, lower extremity edema,  or neurologic sequela. The patient is tolerating medications without difficulties and is otherwise without complaint today.   Past Medical History  Diagnosis Date  . DIABETES MELLITUS 09/24/2008  . HYPERLIPIDEMIA 09/24/2008  . HYPERTENSION 09/24/2008  . MI 09/24/2008  . CAD 09/24/2008    [facility] in 1998 tx with PCI to LAD;  myoview 1/12:  no ischemia, EF 45%  . CARDIOMYOPATHY 09/24/2008    ischemic;  echo 4/12:  EF 20-25%, mild MR, mod LAE, mod reduced RVSF, mod RVE, mild RAE, mild TR, PASP 40  . Persistent atrial fibrillation 09/24/2008    s/p DCCV x 4 in past;  Amiod d/c'd 2/2 abnormal PFTs;  Tikosyn load 6/12 with DCCV  . Edema 09/24/2008  . CAROTID BRUIT 09/24/2008  . Anemia   . GERD (gastroesophageal reflux disease)   . Osteopenia    Past Surgical History  Procedure Laterality Date  . Fracture surgery  2006    hip  . Knee surgery  2008    broken knee    Current Outpatient Prescriptions  Medication Sig Dispense Refill  . aspirin 81 MG EC tablet Take 81 mg by mouth daily.        . atorvastatin (LIPITOR) 10 MG tablet Take 1 tablet (10 mg total) by mouth daily.  90 tablet  3  . Blood Glucose Monitoring Suppl (ACCU-CHEK ACTIVE CARE KIT) KIT Disp 11/06/10  1 each  0  . Calcium  Carbonate-Vitamin D (CALCIUM 500 + D) 500-125 MG-UNIT TABS Take 1 tablet by mouth daily.       . diltiazem (CARDIZEM CD) 360 MG 24 hr capsule Take 360 mg by mouth daily. PT is only taking 240mg once a day      . glucose blood (ACCU-CHEK INSTANT GLUCOSE TEST) test strip Use as instructed checking BS daily  100 each  12  . metoprolol (LOPRESSOR) 100 MG tablet Take 1 tablet (100 mg total) by mouth 2 (two) times daily.  90 tablet  11  . Multiple Vitamin (MULTIVITAMIN PO) Take 1 tablet by mouth daily.        . nitroGLYCERIN (NITROSTAT) 0.4 MG SL tablet Place 1 tablet (0.4 mg total) under the tongue every 5 (five) minutes as needed.  25 tablet  12  . ramipril (ALTACE) 10 MG capsule Take 1 capsule (10 mg total) by mouth daily.  90 capsule  3  . vitamin E 100 UNIT capsule Take 100 Units by mouth daily.        . dofetilide (TIKOSYN) 250 MCG capsule Take 250 mcg by mouth 2 (two) times daily.      . furosemide (LASIX) 40 MG tablet Take 40 mg by mouth daily as needed for fluid.      . potassium chloride (MICRO-K) 10 MEQ CR capsule   Take 10 mEq by mouth daily as needed (for potassium repacement when taking lasix).      . warfarin (COUMADIN) 2.5 MG tablet Take 2.5 mg by mouth daily.       No current facility-administered medications for this visit.    Allergies  Allergen Reactions  . Simvastatin     REACTION: rash    History   Social History  . Marital Status: Widowed    Spouse Name: N/A    Number of Children: N/A  . Years of Education: N/A   Occupational History  . Not on file.   Social History Main Topics  . Smoking status: Former Smoker -- 0.50 packs/day for 20 years    Types: Cigarettes    Quit date: 07/11/1996  . Smokeless tobacco: Not on file  . Alcohol Use: No  . Drug Use: No  . Sexually Active: Not on file   Other Topics Concern  . Not on file   Social History Narrative  . No narrative on file    Family History  Problem Relation Age of Onset  . Cancer Mother     breast   . Heart disease Mother   . Diabetes Mother   . Alcohol abuse Father   . Cancer Sister     breast  . Alcohol abuse Brother   . Coronary artery disease    . Hypertension     Physical Exam: Filed Vitals:   10/23/12 1008 10/23/12 1009 10/23/12 1011 10/23/12 1015  BP: 142/90 138/78 132/82 132/88  Pulse: 90 90 92 91  Height:      Weight:      SpO2: 95% 96% 97% 97%    GEN- The patient is elderly appearing, alert and oriented x 3 today.   Head- normocephalic, atraumatic Eyes-  Sclera clear, conjunctiva pink Ears- hearing intact Oropharynx- clear Neck- supple, no JVP Lymph- no cervical lymphadenopathy Lungs- Clear to ausculation bilaterally, normal work of breathing Heart- irregular rate and rhythm, no murmurs, rubs or gallops,  GI- soft, NT, ND, + BS Extremities- no clubbing, cyanosis, 1+ pedal edema  ekg today reveals afib, with V rates 64 bpm, LVH, nonspecific ST/ T changes  Assessment and Plan:  1. Dizziness, presyncope Unclear etiology but possibly arrhythmic in etiology She is clear that she would like to avoid ICD/ PPM if possible.  I think that given her afib, we need to better characterize her rhythm during episodes with monitoring.  It may be that this can help to guide our medical therapies.  Given the infrequent nature of her events, a 30 day monitor is unlikely to capture the events.  Today, I discussed an implantable loop recorder as an option. Risks, benefits, and alternatives to this procedure were discussed at length with the patient who wishes to proceed.  We will therefore proceed with LINQ implantation at the next available time.  I think that this is necessary to better guide our rate control of her afib.  2. Persistent afib Continue anticoagulation  3. HTN Stable No change required today  4. Chronic systolic dysfunction euvolemic today Though she meets criteria for an ICD, she is clear that she would prefer to avoid this at this time.  

## 2012-11-06 NOTE — CV Procedure (Signed)
SURGEON:  Hillis Range, MD     PREPROCEDURE DIAGNOSIS:  Presyncope and palpitations, atrial fibrillation    POSTPROCEDURE DIAGNOSIS:  Presyncope and palpitations, atrial fibrillation     PROCEDURES:   1. Implantable loop recorder implantation    INTRODUCTION:  [name] is a 79 y.o. female with a history of palpitations and presyncope who presents today for implantable loop implantation.  The patient has had multiple episodes of irregular palpitations with associated presyncope.  She would like to avoid ICD implantation if possible.  She has worn event monitors previously which documented only afib, but no VT or brady events.   The patient therefore presents today for implantable loop implantation.     DESCRIPTION OF PROCEDURE:  Informed written consent was obtained, and the patient was brought to the electrophysiology lab in a fasting state.  The patient required no sedation for the procedure today.  Mapping over the patient's chest was performed by the EP lab staff to identify the area where electrograms were most prominent for ILR recording.  This area was found to be the left parasternal region over the 3rd-4th intercostal space. The patients left chest was therefore prepped and draped in the usual sterile fashion by the EP lab staff. The skin overlying the left parasternal region was infiltrated with lidocaine for local analgesia.  A 0.5-cm incision was made over the left parasternal region over the 3rd intercostal space.  A subcutaneous ILR pocket was fashioned using a combination of sharp and blunt dissection.  A Medtronic Reveal [place] model [id number]  implantable loop recorder was then placed into the pocket  R waves were very prominent and measured 0.27mV.  Steri- Strips and a sterile dressing were then applied.  There were no early apparent complications.     CONCLUSIONS:   1. Successful implantation of a Medtronic Reveal LINQ implantable loop recorder for palpitations and  presyncope  2. No early apparent complications.

## 2012-11-06 NOTE — Interval H&P Note (Signed)
History and Physical Interval Note:  11/06/2012 11:58 AM  [name]  has presented today for surgery, with the diagnosis of Syncope  The various methods of treatment have been discussed with the patient and family. After consideration of risks, benefits and other options for treatment, the patient has consented to  Procedure(s): LOOP RECORDER IMPLANT (N/A) as a surgical intervention .  The patient's history has been reviewed, patient examined, no change in status, stable for surgery.  I have reviewed the patient's chart and labs.  Questions were answered to the patient's satisfaction.     Hillis Range

## 2012-11-17 ENCOUNTER — Ambulatory Visit: Payer: Medicare Other

## 2012-11-20 ENCOUNTER — Ambulatory Visit (INDEPENDENT_AMBULATORY_CARE_PROVIDER_SITE_OTHER): Payer: Medicare Other | Admitting: *Deleted

## 2012-11-20 DIAGNOSIS — I428 Other cardiomyopathies: Secondary | ICD-10-CM

## 2012-11-20 DIAGNOSIS — I4891 Unspecified atrial fibrillation: Secondary | ICD-10-CM

## 2012-11-20 NOTE — Progress Notes (Signed)
Wound check in office-ILR.

## 2012-11-27 ENCOUNTER — Ambulatory Visit (INDEPENDENT_AMBULATORY_CARE_PROVIDER_SITE_OTHER): Payer: Medicare Other | Admitting: *Deleted

## 2012-11-27 DIAGNOSIS — I4891 Unspecified atrial fibrillation: Secondary | ICD-10-CM

## 2012-11-27 DIAGNOSIS — Z7901 Long term (current) use of anticoagulants: Secondary | ICD-10-CM

## 2012-11-28 ENCOUNTER — Encounter: Payer: Self-pay | Admitting: Internal Medicine

## 2012-12-17 ENCOUNTER — Ambulatory Visit (INDEPENDENT_AMBULATORY_CARE_PROVIDER_SITE_OTHER): Payer: Medicare Other | Admitting: *Deleted

## 2012-12-17 ENCOUNTER — Encounter: Payer: Self-pay | Admitting: Internal Medicine

## 2012-12-17 DIAGNOSIS — R55 Syncope and collapse: Secondary | ICD-10-CM

## 2012-12-29 ENCOUNTER — Other Ambulatory Visit: Payer: Self-pay | Admitting: Family Medicine

## 2013-01-02 ENCOUNTER — Ambulatory Visit (INDEPENDENT_AMBULATORY_CARE_PROVIDER_SITE_OTHER): Payer: Medicare Other | Admitting: Pharmacist

## 2013-01-02 DIAGNOSIS — I4891 Unspecified atrial fibrillation: Secondary | ICD-10-CM

## 2013-01-02 DIAGNOSIS — Z7901 Long term (current) use of anticoagulants: Secondary | ICD-10-CM

## 2013-01-02 LAB — POCT INR: INR: 2.5

## 2013-01-04 ENCOUNTER — Other Ambulatory Visit: Payer: Self-pay | Admitting: Internal Medicine

## 2013-01-04 ENCOUNTER — Other Ambulatory Visit: Payer: Self-pay | Admitting: Cardiovascular Disease

## 2013-01-17 LAB — PACEMAKER DEVICE OBSERVATION

## 2013-01-20 ENCOUNTER — Encounter: Payer: Self-pay | Admitting: *Deleted

## 2013-02-12 ENCOUNTER — Ambulatory Visit (INDEPENDENT_AMBULATORY_CARE_PROVIDER_SITE_OTHER): Payer: Medicare Other | Admitting: General Practice

## 2013-02-12 ENCOUNTER — Encounter: Payer: Medicare Other | Admitting: Pharmacist

## 2013-02-12 DIAGNOSIS — Z7901 Long term (current) use of anticoagulants: Secondary | ICD-10-CM

## 2013-02-12 DIAGNOSIS — I4891 Unspecified atrial fibrillation: Secondary | ICD-10-CM

## 2013-02-12 LAB — POCT INR: INR: 3.1

## 2013-02-16 ENCOUNTER — Other Ambulatory Visit: Payer: Self-pay | Admitting: Cardiovascular Disease

## 2013-02-16 ENCOUNTER — Other Ambulatory Visit: Payer: Self-pay | Admitting: *Deleted

## 2013-02-19 ENCOUNTER — Encounter: Payer: Self-pay | Admitting: Internal Medicine

## 2013-02-19 ENCOUNTER — Ambulatory Visit (INDEPENDENT_AMBULATORY_CARE_PROVIDER_SITE_OTHER): Payer: Medicare Other | Admitting: Internal Medicine

## 2013-02-19 VITALS — BP 141/85 | HR 74 | Ht 67.0 in | Wt 193.4 lb

## 2013-02-19 DIAGNOSIS — I5022 Chronic systolic (congestive) heart failure: Secondary | ICD-10-CM

## 2013-02-19 DIAGNOSIS — I4891 Unspecified atrial fibrillation: Secondary | ICD-10-CM

## 2013-02-19 DIAGNOSIS — I2510 Atherosclerotic heart disease of native coronary artery without angina pectoris: Secondary | ICD-10-CM

## 2013-02-19 DIAGNOSIS — I428 Other cardiomyopathies: Secondary | ICD-10-CM

## 2013-02-19 DIAGNOSIS — I10 Essential (primary) hypertension: Secondary | ICD-10-CM

## 2013-02-19 DIAGNOSIS — R42 Dizziness and giddiness: Secondary | ICD-10-CM

## 2013-02-19 LAB — PACEMAKER DEVICE OBSERVATION

## 2013-02-19 NOTE — Patient Instructions (Addendum)
Please remember to record any symptom episodes.    Your home transmitter will continue to monitor you monthly or sooner if needed.   Please go to ER if you pass out.   Your physician recommends that you schedule a follow-up appointment in: 6 weeks with Dr Johney Frame  Your physician has requested that you have an echocardiogram. Echocardiography is a painless test that uses sound waves to create images of your heart. It provides your doctor with information about the size and shape of your heart and how well your heart's chambers and valves are working. This procedure takes approximately one hour. There are no restrictions for this procedure.

## 2013-02-19 NOTE — Progress Notes (Signed)
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 female patient with a h/o persistent atrial fibrillation, CAD, and ischemic CM who presents today for EP follow-up.  She is doing very well. She maintains an active lifestyle.  She does not feel that her afib significant reduces her quality of life.  She is appropriately anticoagulated with coumadin.  Rate control is better.  She denies any further symptoms of dizziness or presyncope..  She is active in her yard.   She denies frank syncope.   Today, she denies symptoms of chest pain, shortness of breath, orthopnea, PND, lower extremity edema,  or neurologic sequela. The patient is tolerating medications without difficulties and is otherwise without complaint today.   Past Medical History  Diagnosis Date  . DIABETES MELLITUS 09/24/2008  . HYPERLIPIDEMIA 09/24/2008  . HYPERTENSION 09/24/2008  . MI 09/24/2008  . CAD 09/24/2008    [facility] in 1998 tx with PCI to LAD;  myoview 1/12:  no ischemia, EF 45%  . CARDIOMYOPATHY 09/24/2008    ischemic;  echo 4/12:  EF 20-25%, mild MR, mod LAE, mod reduced RVSF, mod RVE, mild RAE, mild TR, PASP 40  . Persistent atrial fibrillation 09/24/2008    s/p DCCV x 4 in past;  Amiod d/c'd 2/2 abnormal PFTs;  Tikosyn load 6/12 with DCCV  . Edema 09/24/2008  . CAROTID BRUIT 09/24/2008  . Anemia   . GERD (gastroesophageal reflux disease)   . Osteopenia   . Tachycardia-bradycardia    Past Surgical History  Procedure Laterality Date  . Fracture surgery  2006    hip  . Knee surgery  2008    broken knee  . Loop recorder implant  11-06-2012    Medtronic LinQ implanted by Dr Johney Frame for palpitaitons and dizziness    Current Outpatient Prescriptions  Medication Sig Dispense Refill  . aspirin 81 MG EC tablet Take 81 mg by mouth daily.        Marland Kitchen atorvastatin (LIPITOR) 10 MG tablet TAKE ONE TABLET BY MOUTH EVERY DAY  90 tablet  1  . Blood Glucose Monitoring Suppl (ACCU-CHEK ACTIVE CARE KIT) KIT Disp  11/06/10  1 each  0  . Calcium Carbonate-Vitamin D (CALCIUM 500 + D) 500-125 MG-UNIT TABS Take 1 tablet by mouth daily.       Marland Kitchen dofetilide (TIKOSYN) 250 MCG capsule Take 250 mcg by mouth 2 (two) times daily.      . furosemide (LASIX) 40 MG tablet Take 40 mg by mouth daily as needed for fluid.      Marland Kitchen glucose blood (ACCU-CHEK INSTANT GLUCOSE TEST) test strip Use as instructed checking BS daily  100 each  12  . metoprolol (LOPRESSOR) 100 MG tablet TAKE ONE TABLET BY MOUTH TWICE DAILY  90 tablet  3  . Multiple Vitamin (MULTIVITAMIN PO) Take 1 tablet by mouth daily.        . nitroGLYCERIN (NITROSTAT) 0.4 MG SL tablet Place 1 tablet (0.4 mg total) under the tongue every 5 (five) minutes as needed.  25 tablet  12  . potassium chloride (MICRO-K) 10 MEQ CR capsule Take 10 mEq by mouth daily as needed (for potassium repacement when taking lasix).      . ramipril (ALTACE) 10 MG capsule TAKE ONE CAPSULE BY MOUTH DAILY  90 capsule  3  . vitamin E 100 UNIT capsule Take 100 Units by mouth daily.        Marland Kitchen warfarin (COUMADIN) 2.5 MG tablet  TAKE ONE TABLET BY MOUTH AS DIRECTED-TAKE AS DIRECTED BY COUMADIN CLINIC  105 tablet  0   No current facility-administered medications for this visit.    Allergies  Allergen Reactions  . Simvastatin     REACTION: rash    History   Social History  . Marital Status: Widowed    Spouse Name: N/A    Number of Children: N/A  . Years of Education: N/A   Occupational History  . Not on file.   Social History Main Topics  . Smoking status: Former Smoker -- 0.50 packs/day for 20 years    Types: Cigarettes    Quit date: 07/11/1996  . Smokeless tobacco: Not on file  . Alcohol Use: No  . Drug Use: No  . Sexual Activity: Not on file   Other Topics Concern  . Not on file   Social History Narrative  . No narrative on file    Family History  Problem Relation Age of Onset  . Cancer Mother     breast  . Heart disease Mother   . Diabetes Mother   . Alcohol abuse  Father   . Cancer Sister     breast  . Alcohol abuse Brother   . Coronary artery disease    . Hypertension     Physical Exam: Filed Vitals:   02/19/13 0936  BP: 141/85  Pulse: 74  Height: 5\' 7"  (1.702 m)  Weight: 193 lb 6.4 oz (87.726 kg)    GEN- The patient is elderly appearing, alert and oriented x 3 today.   Head- normocephalic, atraumatic Eyes-  Sclera clear, conjunctiva pink Ears- hearing intact Oropharynx- clear Neck- supple, no JVP Lymph- no cervical lymphadenopathy Lungs- Clear to ausculation bilaterally, normal work of breathing Heart- irregular rate and rhythm, no murmurs, rubs or gallops,  GI- soft, NT, ND, + BS Extremities- no clubbing, cyanosis, 1+ pedal edema  LINQ interrogation is reviewed  Assessment and Plan:  1. Dizziness, presyncope Much improved though her LINQ interrogation reveals frequent pauses of 3 seconds.  If episodes return we may need to consider PPM implant.  I will repeat echo to see if she is a candidate for a leadless device. She is instructed to go to the ER immediately if further presyncope occurs.  2. Persistent afib Continue anticoagulation Will follow afib burden by LINQ  3. HTN Stable No change required today  4. Chronic systolic dysfunction euvolemic today Though she meets criteria for an ICD, she is clear that she would prefer to avoid this at this time. Repeat echo  Return in 6 weeks

## 2013-03-05 ENCOUNTER — Ambulatory Visit (HOSPITAL_COMMUNITY): Payer: Medicare Other | Attending: Cardiology

## 2013-03-05 ENCOUNTER — Encounter: Payer: Self-pay | Admitting: Cardiology

## 2013-03-05 ENCOUNTER — Other Ambulatory Visit (HOSPITAL_COMMUNITY): Payer: Medicare Other | Admitting: Cardiology

## 2013-03-05 DIAGNOSIS — R0989 Other specified symptoms and signs involving the circulatory and respiratory systems: Secondary | ICD-10-CM | POA: Insufficient documentation

## 2013-03-05 DIAGNOSIS — I059 Rheumatic mitral valve disease, unspecified: Secondary | ICD-10-CM | POA: Insufficient documentation

## 2013-03-05 DIAGNOSIS — I10 Essential (primary) hypertension: Secondary | ICD-10-CM | POA: Insufficient documentation

## 2013-03-05 DIAGNOSIS — I4891 Unspecified atrial fibrillation: Secondary | ICD-10-CM

## 2013-03-05 DIAGNOSIS — I509 Heart failure, unspecified: Secondary | ICD-10-CM | POA: Insufficient documentation

## 2013-03-05 DIAGNOSIS — R609 Edema, unspecified: Secondary | ICD-10-CM | POA: Insufficient documentation

## 2013-03-05 DIAGNOSIS — Z87891 Personal history of nicotine dependence: Secondary | ICD-10-CM | POA: Insufficient documentation

## 2013-03-05 DIAGNOSIS — I379 Nonrheumatic pulmonary valve disorder, unspecified: Secondary | ICD-10-CM | POA: Insufficient documentation

## 2013-03-05 DIAGNOSIS — E785 Hyperlipidemia, unspecified: Secondary | ICD-10-CM | POA: Insufficient documentation

## 2013-03-05 DIAGNOSIS — E119 Type 2 diabetes mellitus without complications: Secondary | ICD-10-CM | POA: Insufficient documentation

## 2013-03-05 DIAGNOSIS — I079 Rheumatic tricuspid valve disease, unspecified: Secondary | ICD-10-CM | POA: Insufficient documentation

## 2013-03-05 DIAGNOSIS — I2589 Other forms of chronic ischemic heart disease: Secondary | ICD-10-CM | POA: Insufficient documentation

## 2013-03-05 DIAGNOSIS — I2510 Atherosclerotic heart disease of native coronary artery without angina pectoris: Secondary | ICD-10-CM | POA: Insufficient documentation

## 2013-03-05 DIAGNOSIS — R42 Dizziness and giddiness: Secondary | ICD-10-CM | POA: Insufficient documentation

## 2013-03-05 MED ORDER — PERFLUTREN PROTEIN A MICROSPH IV SUSP
2.0000 mL | Freq: Once | INTRAVENOUS | Status: AC
  Administered 2013-03-05: 2 mL via INTRAVENOUS

## 2013-03-05 NOTE — Progress Notes (Signed)
Echocardiogram performed.  

## 2013-03-13 ENCOUNTER — Ambulatory Visit (INDEPENDENT_AMBULATORY_CARE_PROVIDER_SITE_OTHER): Payer: Medicare Other

## 2013-03-13 DIAGNOSIS — Z23 Encounter for immunization: Secondary | ICD-10-CM

## 2013-03-23 ENCOUNTER — Ambulatory Visit (INDEPENDENT_AMBULATORY_CARE_PROVIDER_SITE_OTHER): Payer: Medicare Other | Admitting: Family Medicine

## 2013-03-23 ENCOUNTER — Encounter: Payer: Self-pay | Admitting: Family Medicine

## 2013-03-23 VITALS — BP 130/80 | HR 122 | Temp 97.4°F | Wt 194.0 lb

## 2013-03-23 DIAGNOSIS — E785 Hyperlipidemia, unspecified: Secondary | ICD-10-CM

## 2013-03-23 DIAGNOSIS — E669 Obesity, unspecified: Secondary | ICD-10-CM | POA: Insufficient documentation

## 2013-03-23 DIAGNOSIS — I4891 Unspecified atrial fibrillation: Secondary | ICD-10-CM

## 2013-03-23 DIAGNOSIS — G4733 Obstructive sleep apnea (adult) (pediatric): Secondary | ICD-10-CM

## 2013-03-23 DIAGNOSIS — E119 Type 2 diabetes mellitus without complications: Secondary | ICD-10-CM

## 2013-03-23 DIAGNOSIS — I10 Essential (primary) hypertension: Secondary | ICD-10-CM

## 2013-03-23 LAB — BASIC METABOLIC PANEL
Calcium: 10.1 mg/dL (ref 8.4–10.5)
GFR: 74.14 mL/min (ref >60.00)
Sodium: 140 mEq/L (ref 135–145)

## 2013-03-23 LAB — HEPATIC FUNCTION PANEL
ALT: 14 U/L (ref 0–35)
AST: 19 U/L (ref 0–37)
Albumin: 4 g/dL (ref 3.5–5.2)
Total Bilirubin: 0.9 mg/dL (ref 0.3–1.2)
Total Protein: 7.4 g/dL (ref 6.0–8.3)

## 2013-03-23 LAB — HEMOGLOBIN A1C: Hgb A1c MFr Bld: 6 % (ref 4.6–6.5)

## 2013-03-23 LAB — LIPID PANEL
Cholesterol: 172 mg/dL (ref 0–200)
HDL: 44.4 mg/dL (ref >39.00)
Triglycerides: 104 mg/dL (ref 0.0–149.0)

## 2013-03-23 NOTE — Progress Notes (Signed)
Subjective:    Patient ID: [name], female    DOB: 18-Feb-1930, 79 y.o.   MRN: [id number]  HPI Medical follow up Patient has history of obesity, CAD, systolic heart failure, hypertension, atrial fibrillation, obstructive sleep apnea on CPAP. She has reported type 2 diabetes but had consistently well-controlled A1c and fasting blood sugars consistently less than 120. Appears that she is more in the prediabetes range. No recent symptoms of hyperglycemia.  She remains on Coumadin for atrial fibrillation. She is followed by cardiology. Recent echocardiogram ejection fraction at 20-25%. She has dyspnea with minimal activity. No recent chest pains. No increased peripheral edema. She's had some transient episodes of asystole (5 secs) per monitoring and plans to see cardiologist soon. No recent syncope.  She has some nonspecific fatigue but overall feels fairly well. Compliant with medications. She'll he takes furosemide as needed. She remains on Ramipril and metoprolol regularly. Also on Tikosyn and atorvastatin.   No recent chest pains. No recent falls. Mood is been stable.  She states she's had previous pneumonia vaccine and she declines Prevnar 13 today. Flu vaccine given 2 weeks ago.  Past Medical History  Diagnosis Date  . DIABETES MELLITUS 09/24/2008  . HYPERLIPIDEMIA 09/24/2008  . HYPERTENSION 09/24/2008  . MI 09/24/2008  . CAD 09/24/2008    [facility] in 1998 tx with PCI to LAD;  myoview 1/12:  no ischemia, EF 45%  . CARDIOMYOPATHY 09/24/2008    ischemic;  echo 4/12:  EF 20-25%, mild MR, mod LAE, mod reduced RVSF, mod RVE, mild RAE, mild TR, PASP 40  . Persistent atrial fibrillation 09/24/2008    s/p DCCV x 4 in past;  Amiod d/c'd 2/2 abnormal PFTs;  Tikosyn load 6/12 with DCCV  . Edema 09/24/2008  . CAROTID BRUIT 09/24/2008  . Anemia   . GERD (gastroesophageal reflux disease)   . Osteopenia   . Tachycardia-bradycardia    Past Surgical History  Procedure Laterality Date  . Fracture surgery  2006      hip  . Knee surgery  2008    broken knee  . Loop recorder implant  11-06-2012    Medtronic LinQ implanted by Dr Johney Frame for palpitaitons and dizziness    reports that she quit smoking about 16 years ago. Her smoking use included Cigarettes. She has a 10 pack-year smoking history. She does not have any smokeless tobacco history on file. She reports that she does not drink alcohol or use illicit drugs. family history includes Alcohol abuse in her brother and father; Cancer in her mother and sister; Coronary artery disease in an other family member; Diabetes in her mother; Heart disease in her mother; Hypertension in an other family member. Allergies  Allergen Reactions  . Simvastatin     REACTION: rash      Review of Systems  Constitutional: Positive for fatigue.  Eyes: Negative for visual disturbance.  Respiratory: Positive for shortness of breath. Negative for cough, chest tightness and wheezing.   Cardiovascular: Negative for chest pain, palpitations and leg swelling.  Endocrine: Negative for polydipsia and polyuria.  Genitourinary: Negative for dysuria.  Neurological: Negative for dizziness, seizures, syncope, weakness, light-headedness and headaches.  Psychiatric/Behavioral: Negative for confusion.       Objective:   Physical Exam  Constitutional: She appears well-developed and well-nourished.  Cardiovascular: Normal rate.   Irregular rhythm but rate controlled  Pulmonary/Chest: Effort normal and breath sounds normal. No respiratory distress. She has no wheezes. She has no rales.  Musculoskeletal: She exhibits  no edema.  Neurological: She is alert.  Psychiatric: She has a normal mood and affect. Her behavior is normal.          Assessment & Plan:  #1 history of type 2 diabetes. Repeat A1c. Continue home monitoring. Discussed importance of weight control #2 atrial fibrillation on Coumadin. She is rate controlled at this time. Followed by cardiology #3 systolic heart  failure. No evidence for acute pulmonary edema at this time. Maintained on ACE inhibitor and beta blocker. #4 health maintenance. We recommended consideration for Prevnar 13 and at this point she wishes to wait #5 hyperlipidemia. Repeat lipid and hepatic panel

## 2013-03-23 NOTE — Progress Notes (Signed)
Pre visit review using our clinic review tool, if applicable. No additional management support is needed unless otherwise documented below in the visit note. 

## 2013-03-29 ENCOUNTER — Other Ambulatory Visit: Payer: Self-pay | Admitting: Internal Medicine

## 2013-04-01 ENCOUNTER — Encounter: Payer: Self-pay | Admitting: Internal Medicine

## 2013-04-01 ENCOUNTER — Ambulatory Visit (INDEPENDENT_AMBULATORY_CARE_PROVIDER_SITE_OTHER): Payer: Medicare Other | Admitting: Internal Medicine

## 2013-04-01 ENCOUNTER — Ambulatory Visit (INDEPENDENT_AMBULATORY_CARE_PROVIDER_SITE_OTHER): Payer: Medicare Other | Admitting: *Deleted

## 2013-04-01 VITALS — BP 144/87 | HR 89 | Ht 67.0 in | Wt 195.0 lb

## 2013-04-01 DIAGNOSIS — I4891 Unspecified atrial fibrillation: Secondary | ICD-10-CM

## 2013-04-01 DIAGNOSIS — I5022 Chronic systolic (congestive) heart failure: Secondary | ICD-10-CM

## 2013-04-01 DIAGNOSIS — I428 Other cardiomyopathies: Secondary | ICD-10-CM

## 2013-04-01 DIAGNOSIS — R42 Dizziness and giddiness: Secondary | ICD-10-CM

## 2013-04-01 DIAGNOSIS — Z7901 Long term (current) use of anticoagulants: Secondary | ICD-10-CM

## 2013-04-01 DIAGNOSIS — I10 Essential (primary) hypertension: Secondary | ICD-10-CM

## 2013-04-01 LAB — MDC_IDC_ENUM_SESS_TYPE_INCLINIC

## 2013-04-01 MED ORDER — RAMIPRIL 10 MG PO CAPS: 20.0000 mg | ORAL_CAPSULE | Freq: Every day | ORAL | Status: DC

## 2013-04-01 NOTE — Patient Instructions (Addendum)
Your physician recommends that you schedule a follow-up appointment in: 3 months with Dr Johney Frame   Your physician has recommended you make the following change in your medication:  1) Increase Lisinopril to 20mg  daily     Your physician has recommended that you have a pulmonary function test. Pulmonary Function Tests are a group of tests that measure how well air moves in and out of your lungs.

## 2013-04-01 NOTE — Progress Notes (Signed)
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 female patient with a h/o persistent atrial fibrillation, CAD, and ischemic CM who presents today for EP follow-up.  She is doing very well. She maintains an active lifestyle.  She does not feel that her afib significant reduces her quality of life.  She is appropriately anticoagulated with coumadin.  Rate control is better.  She denies any further symptoms of dizziness or presyncope.    She denies frank syncope.   Today, she denies symptoms of chest pain, shortness of breath, orthopnea, PND, lower extremity edema,  or neurologic sequela. The patient is tolerating medications without difficulties and is otherwise without complaint today.   Past Medical History  Diagnosis Date  . DIABETES MELLITUS 09/24/2008  . HYPERLIPIDEMIA 09/24/2008  . HYPERTENSION 09/24/2008  . MI 09/24/2008  . CAD 09/24/2008    [facility] in 1998 tx with PCI to LAD;  myoview 1/12:  no ischemia, EF 45%  . CARDIOMYOPATHY 09/24/2008    ischemic;  echo 4/12:  EF 20-25%, mild MR, mod LAE, mod reduced RVSF, mod RVE, mild RAE, mild TR, PASP 40  . Persistent atrial fibrillation 09/24/2008    s/p DCCV x 4 in past;  Amiod d/c'd 2/2 abnormal PFTs;  Tikosyn load 6/12 with DCCV  . Edema 09/24/2008  . CAROTID BRUIT 09/24/2008  . Anemia   . GERD (gastroesophageal reflux disease)   . Osteopenia   . Tachycardia-bradycardia    Past Surgical History  Procedure Laterality Date  . Fracture surgery  2006    hip  . Knee surgery  2008    broken knee  . Loop recorder implant  11-06-2012    Medtronic LinQ implanted by Dr Johney Frame for palpitaitons and dizziness    Current Outpatient Prescriptions  Medication Sig Dispense Refill  . aspirin 81 MG EC tablet Take 81 mg by mouth daily.        Marland Kitchen atorvastatin (LIPITOR) 10 MG tablet TAKE ONE TABLET BY MOUTH EVERY DAY  90 tablet  1  . Blood Glucose Monitoring Suppl (ACCU-CHEK ACTIVE CARE KIT) KIT Disp 11/06/10  1 each  0  . Calcium  Carbonate-Vitamin D (CALCIUM 500 + D) 500-125 MG-UNIT TABS Take 1 tablet by mouth daily.       Marland Kitchen dofetilide (TIKOSYN) 250 MCG capsule Take 250 mcg by mouth 2 (two) times daily.      . furosemide (LASIX) 40 MG tablet Take 40 mg by mouth daily as needed for fluid.      Marland Kitchen glucose blood (ACCU-CHEK INSTANT GLUCOSE TEST) test strip Use as instructed checking BS daily  100 each  12  . metoprolol (LOPRESSOR) 100 MG tablet TAKE ONE TABLET BY MOUTH TWICE DAILY  90 tablet  3  . Multiple Vitamin (MULTIVITAMIN PO) Take 1 tablet by mouth daily.        . nitroGLYCERIN (NITROSTAT) 0.4 MG SL tablet Place 1 tablet (0.4 mg total) under the tongue every 5 (five) minutes as needed.  25 tablet  12  . potassium chloride (MICRO-K) 10 MEQ CR capsule Take 10 mEq by mouth daily as needed (for potassium repacement when taking lasix).      . ramipril (ALTACE) 10 MG capsule TAKE ONE CAPSULE BY MOUTH DAILY  90 capsule  3  . TIKOSYN 250 MCG capsule TAKE ONE CAPSULE BY MOUTH TWICE A DAY  60 capsule  5  . vitamin E 100 UNIT capsule Take 100 Units by mouth daily.        Marland Kitchen  warfarin (COUMADIN) 2.5 MG tablet TAKE ONE TABLET BY MOUTH AS DIRECTED-TAKE AS DIRECTED BY COUMADIN CLINIC  105 tablet  0   No current facility-administered medications for this visit.    Allergies  Allergen Reactions  . Simvastatin     REACTION: rash    History   Social History  . Marital Status: Widowed    Spouse Name: N/A    Number of Children: N/A  . Years of Education: N/A   Occupational History  . Not on file.   Social History Main Topics  . Smoking status: Former Smoker -- 0.50 packs/day for 20 years    Types: Cigarettes    Quit date: 07/11/1996  . Smokeless tobacco: Not on file  . Alcohol Use: No  . Drug Use: No  . Sexual Activity: Not on file   Other Topics Concern  . Not on file   Social History Narrative  . No narrative on file    Family History  Problem Relation Age of Onset  . Cancer Mother     breast  . Heart disease  Mother   . Diabetes Mother   . Alcohol abuse Father   . Cancer Sister     breast  . Alcohol abuse Brother   . Coronary artery disease    . Hypertension     Physical Exam: Filed Vitals:   04/01/13 0832  BP: 144/87  Pulse: 89  Height: 5\' 7"  (1.702 m)  Weight: 195 lb (88.451 kg)    GEN- The patient is elderly appearing, alert and oriented x 3 today.   Head- normocephalic, atraumatic Eyes-  Sclera clear, conjunctiva pink Ears- hearing intact Oropharynx- clear Neck- supple, no JVP Lymph- no cervical lymphadenopathy Lungs- Clear to ausculation bilaterally, normal work of breathing Heart- irregular rate and rhythm, no murmurs, rubs or gallops,  GI- soft, NT, ND, + BS Extremities- no clubbing, cyanosis, 1+ pedal edema  LINQ interrogation is reviewed  Assessment and Plan:  1. Dizziness, presyncope No pauses on LINQ since last visit and no presyncope Will continue to monitor  2. Persistent afib Continue anticoagulation Will follow afib burden by [facility]- presently 52.5% V rates are mostly < 100 bpm Check PFTs to follow-up on prior PFTs with amiodarone  3. HTN Above goal No change required today  4. Chronic systolic dysfunction euvolemic today Though she meets criteria for an ICD, she is clear that she would prefer to avoid this at this time.  I think that given her advanced age this is the right decision Echo results reviewed with the patient Increase ramipril to 20mg  daily bmet from 03/23/13 are reviewed  Return in 3 months

## 2013-04-03 ENCOUNTER — Ambulatory Visit: Payer: Medicare Other | Admitting: Family Medicine

## 2013-04-20 ENCOUNTER — Ambulatory Visit (INDEPENDENT_AMBULATORY_CARE_PROVIDER_SITE_OTHER): Payer: Medicare Other | Admitting: *Deleted

## 2013-04-20 DIAGNOSIS — I4891 Unspecified atrial fibrillation: Secondary | ICD-10-CM

## 2013-05-13 ENCOUNTER — Other Ambulatory Visit: Payer: Self-pay | Admitting: Cardiovascular Disease

## 2013-05-13 ENCOUNTER — Ambulatory Visit (INDEPENDENT_AMBULATORY_CARE_PROVIDER_SITE_OTHER): Payer: Medicare HMO | Admitting: Pharmacist

## 2013-05-13 DIAGNOSIS — I4891 Unspecified atrial fibrillation: Secondary | ICD-10-CM

## 2013-05-13 DIAGNOSIS — Z7901 Long term (current) use of anticoagulants: Secondary | ICD-10-CM

## 2013-05-13 LAB — POCT INR: INR: 3

## 2013-05-20 ENCOUNTER — Ambulatory Visit (INDEPENDENT_AMBULATORY_CARE_PROVIDER_SITE_OTHER): Payer: Medicare HMO | Admitting: *Deleted

## 2013-05-20 DIAGNOSIS — I4891 Unspecified atrial fibrillation: Secondary | ICD-10-CM

## 2013-06-04 ENCOUNTER — Other Ambulatory Visit: Payer: Self-pay

## 2013-06-04 ENCOUNTER — Telehealth: Payer: Self-pay | Admitting: Internal Medicine

## 2013-06-04 DIAGNOSIS — I4891 Unspecified atrial fibrillation: Secondary | ICD-10-CM

## 2013-06-04 MED ORDER — METOPROLOL TARTRATE 100 MG PO TABS: ORAL_TABLET | ORAL | Status: DC

## 2013-06-04 MED ORDER — ATORVASTATIN CALCIUM 10 MG PO TABS: ORAL_TABLET | ORAL | Status: DC

## 2013-06-04 MED ORDER — FUROSEMIDE 40 MG PO TABS: 40.0000 mg | ORAL_TABLET | Freq: Every day | ORAL | Status: DC | PRN

## 2013-06-04 MED ORDER — POTASSIUM CHLORIDE ER 10 MEQ PO CPCR: 10.0000 meq | ORAL_CAPSULE | Freq: Every day | ORAL | Status: DC | PRN

## 2013-06-04 MED ORDER — RAMIPRIL 10 MG PO CAPS: 20.0000 mg | ORAL_CAPSULE | Freq: Every day | ORAL | Status: DC

## 2013-06-04 NOTE — Telephone Encounter (Signed)
Efraim Kaufmann is going to call the daughter Melburn Popper to schedule her PFT's [phone] today close to 5pm

## 2013-06-04 NOTE — Telephone Encounter (Signed)
New problem   Pt need to talk to nurse concerning scheduling her PFT study. Please call pt.

## 2013-06-15 ENCOUNTER — Encounter (HOSPITAL_COMMUNITY): Payer: Medicare HMO

## 2013-06-16 LAB — MDC_IDC_ENUM_SESS_TYPE_REMOTE

## 2013-06-18 LAB — MDC_IDC_ENUM_SESS_TYPE_REMOTE

## 2013-06-22 ENCOUNTER — Ambulatory Visit (INDEPENDENT_AMBULATORY_CARE_PROVIDER_SITE_OTHER): Payer: Medicare HMO | Admitting: *Deleted

## 2013-06-22 ENCOUNTER — Ambulatory Visit (HOSPITAL_COMMUNITY)
Admission: RE | Admit: 2013-06-22 | Discharge: 2013-06-22 | Disposition: A | Discharge status: Home / Self Care | Payer: Medicare HMO | Source: Ambulatory Visit | Attending: Internal Medicine | Admitting: Internal Medicine

## 2013-06-22 DIAGNOSIS — I4891 Unspecified atrial fibrillation: Secondary | ICD-10-CM

## 2013-06-22 DIAGNOSIS — R55 Syncope and collapse: Secondary | ICD-10-CM

## 2013-06-22 LAB — PULMONARY FUNCTION TEST
DL/VA % PRED: 67 %
DL/VA: 3.18 ml/min/mmHg/L
DLCO COR % PRED: 46 %
DLCO COR: 11.02 ml/min/mmHg
DLCO UNC % PRED: 48 %
DLCO unc: 11.41 ml/min/mmHg
FEF 25-75 POST: 1.17 L/s
FEF 25-75 PRE: 1.58 L/s
FEF2575-%CHANGE-POST: -25 %
FEF2575-%PRED-POST: 91 %
FEF2575-%PRED-PRE: 122 %
FEV1-%CHANGE-POST: -9 %
FEV1-%PRED-PRE: 106 %
FEV1-%Pred-Post: 96 %
FEV1-PRE: 1.95 L
FEV1-Post: 1.77 L
FEV1FVC-%Change-Post: -9 %
FEV1FVC-%PRED-PRE: 104 %
FEV6-%Change-Post: 0 %
FEV6-%PRED-POST: 109 %
FEV6-%Pred-Pre: 110 %
FEV6-POST: 2.55 L
FEV6-Pre: 2.56 L
FEV6FVC-%CHANGE-POST: 0 %
FEV6FVC-%Pred-Post: 106 %
FEV6FVC-%Pred-Pre: 106 %
FVC-%CHANGE-POST: 0 %
FVC-%PRED-PRE: 103 %
FVC-%Pred-Post: 103 %
FVC-POST: 2.57 L
FVC-Pre: 2.57 L
POST FEV1/FVC RATIO: 69 %
PRE FEV1/FVC RATIO: 76 %
Post FEV6/FVC ratio: 100 %
Pre FEV6/FVC Ratio: 100 %
RV % pred: 50 %
RV: 1.22 L
TLC % pred: 79 %
TLC: 3.97 L

## 2013-06-22 MED ORDER — ALBUTEROL SULFATE (2.5 MG/3ML) 0.083% IN NEBU
2.5000 mg | INHALATION_SOLUTION | Freq: Once | RESPIRATORY_TRACT | Status: AC
  Administered 2013-06-22: 2.5 mg via RESPIRATORY_TRACT

## 2013-06-23 ENCOUNTER — Ambulatory Visit (INDEPENDENT_AMBULATORY_CARE_PROVIDER_SITE_OTHER): Payer: Commercial Managed Care - HMO | Admitting: *Deleted

## 2013-06-23 DIAGNOSIS — I4891 Unspecified atrial fibrillation: Secondary | ICD-10-CM

## 2013-06-23 DIAGNOSIS — Z7901 Long term (current) use of anticoagulants: Secondary | ICD-10-CM

## 2013-06-23 LAB — POCT INR: INR: 2.6

## 2013-07-02 ENCOUNTER — Encounter: Payer: Self-pay | Admitting: Internal Medicine

## 2013-07-15 ENCOUNTER — Other Ambulatory Visit: Payer: Self-pay | Admitting: *Deleted

## 2013-07-15 MED ORDER — WARFARIN SODIUM 2.5 MG PO TABS: ORAL_TABLET | ORAL | Status: DC

## 2013-07-22 ENCOUNTER — Encounter: Payer: Self-pay | Admitting: Internal Medicine

## 2013-07-22 ENCOUNTER — Ambulatory Visit (INDEPENDENT_AMBULATORY_CARE_PROVIDER_SITE_OTHER): Payer: Medicare HMO | Admitting: *Deleted

## 2013-07-22 ENCOUNTER — Ambulatory Visit (INDEPENDENT_AMBULATORY_CARE_PROVIDER_SITE_OTHER): Payer: Medicare HMO | Admitting: Internal Medicine

## 2013-07-22 VITALS — BP 126/78 | HR 94 | Ht 63.0 in | Wt 194.0 lb

## 2013-07-22 DIAGNOSIS — I4891 Unspecified atrial fibrillation: Secondary | ICD-10-CM

## 2013-07-22 DIAGNOSIS — J984 Other disorders of lung: Secondary | ICD-10-CM

## 2013-07-22 DIAGNOSIS — I5022 Chronic systolic (congestive) heart failure: Secondary | ICD-10-CM

## 2013-07-22 DIAGNOSIS — I10 Essential (primary) hypertension: Secondary | ICD-10-CM

## 2013-07-22 DIAGNOSIS — Z7901 Long term (current) use of anticoagulants: Secondary | ICD-10-CM

## 2013-07-22 DIAGNOSIS — R42 Dizziness and giddiness: Secondary | ICD-10-CM

## 2013-07-22 LAB — MDC_IDC_ENUM_SESS_TYPE_INCLINIC
MDC IDC SESS DTM: 20150401092218
Zone Setting Detection Interval: 2000 ms
Zone Setting Detection Interval: 3000 ms
Zone Setting Detection Interval: 400 ms

## 2013-07-22 LAB — POCT INR: INR: 2.8

## 2013-07-22 NOTE — Patient Instructions (Signed)
Your physician recommends that you schedule a follow-up appointment in: 3 months with Dr Eden Emms and 6 months with Dr Johney Frame   You have been referred to Dr Delton Coombes

## 2013-07-22 NOTE — Progress Notes (Signed)
PCP: Joanne CoveyBURCHETTE,BRUCE W, MD Primary Cardiologist:  [name]  [name] is a 80 y.o. female patient with a h/o persistent atrial fibrillation, CAD, and ischemic CM who presents today for EP follow-up.  She is doing very well. She maintains an active lifestyle.  She does not feel that her afib significant reduces her quality of life.  She is appropriately anticoagulated with coumadin.  Rate control is better.  She denies any further symptoms of dizziness, presyncope, or syncope.  She continues to think about stopping tikosyn and proceeding with rate control long term. Today, she denies symptoms of chest pain, shortness of breath, orthopnea, PND, lower extremity edema,  or neurologic sequela. The patient is tolerating medications without difficulties and is otherwise without complaint today.   Past Medical History  Diagnosis Date  . DIABETES MELLITUS 09/24/2008  . HYPERLIPIDEMIA 09/24/2008  . HYPERTENSION 09/24/2008  . MI 09/24/2008  . CAD 09/24/2008    [facility]I in 1998 tx with PCI to LAD;  myoview 1/12:  no ischemia, EF 45%  . CARDIOMYOPATHY 09/24/2008    ischemic;  echo 4/12:  EF 20-25%, mild MR, mod LAE, mod reduced RVSF, mod RVE, mild RAE, mild TR, PASP 40  . Persistent atrial fibrillation 09/24/2008    s/p DCCV x 4 in past;  Amiod d/c'd 2/2 abnormal PFTs;  Tikosyn load 6/12 with DCCV  . Edema 09/24/2008  . CAROTID BRUIT 09/24/2008  . Anemia   . GERD (gastroesophageal reflux disease)   . Osteopenia   . Tachycardia-bradycardia    Past Surgical History  Procedure Laterality Date  . Fracture surgery  2006    hip  . Knee surgery  2008    broken knee  . Loop recorder implant  11-06-2012    Medtronic LinQ implanted by [name] for palpitaitons and dizziness    Current Outpatient Prescriptions  Medication Sig Dispense Refill  . aspirin 81 MG EC tablet Take 81 mg by mouth daily.        Marland Kitchen. atorvastatin (LIPITOR) 10 MG tablet TAKE ONE TABLET BY MOUTH EVERY DAY  90 tablet  1  . Calcium Carbonate-Vitamin D  (CALCIUM 500 + D) 500-125 MG-UNIT TABS Take 1 tablet by mouth daily.       Marland Kitchen. dofetilide (TIKOSYN) 250 MCG capsule Take 250 mcg by mouth 2 (two) times daily.      . furosemide (LASIX) 40 MG tablet Take 1 tablet (40 mg total) by mouth daily as needed for fluid.  30 tablet  6  . metoprolol (LOPRESSOR) 100 MG tablet TAKE ONE TABLET BY MOUTH TWICE DAILY  90 tablet  1  . Multiple Vitamin (MULTIVITAMIN PO) Take 1 tablet by mouth daily.        . nitroGLYCERIN (NITROSTAT) 0.4 MG SL tablet Place 1 tablet (0.4 mg total) under the tongue every 5 (five) minutes as needed.  25 tablet  12  . potassium chloride (MICRO-K) 10 MEQ CR capsule Take 1 capsule (10 mEq total) by mouth daily as needed (for potassium repacement when taking lasix).  90 capsule  0  . ramipril (ALTACE) 10 MG capsule Take 2 capsules (20 mg total) by mouth daily.  180 capsule  1  . vitamin E 100 UNIT capsule Take 100 Units by mouth daily.        Marland Kitchen. warfarin (COUMADIN) 2.5 MG tablet Take as directed by coumadin clinic  105 tablet  1   No current facility-administered medications for this visit.    Allergies  Allergen Reactions  .  Simvastatin     REACTION: rash    History   Social History  . Marital Status: Widowed    Spouse Name: N/A    Number of Children: N/A  . Years of Education: N/A   Occupational History  . Not on file.   Social History Main Topics  . Smoking status: Former Smoker -- 0.50 packs/day for 20 years    Types: Cigarettes    Quit date: 07/11/1996  . Smokeless tobacco: Not on file  . Alcohol Use: No  . Drug Use: No  . Sexual Activity: Not on file   Other Topics Concern  . Not on file   Social History Narrative  . No narrative on file    Family History  Problem Relation Age of Onset  . Cancer Mother     breast  . Heart disease Mother   . Diabetes Mother   . Alcohol abuse Father   . Cancer Sister     breast  . Alcohol abuse Brother   . Coronary artery disease    . Hypertension     Physical  Exam: Filed Vitals:   07/22/13 0829  BP: 126/78  Pulse: 94  Height: 5\' 3"  (1.6 m)  Weight: 194 lb (87.998 kg)    GEN- The patient is elderly appearing, alert and oriented x 3 today.   Head- normocephalic, atraumatic Eyes-  Sclera clear, conjunctiva pink Ears- hearing intact Oropharynx- clear Neck- supple, no JVP Lymph- no cervical lymphadenopathy Lungs- Clear to ausculation bilaterally, normal work of breathing Heart- irregular rate and rhythm, no murmurs, rubs or gallops,  GI- soft, NT, ND, + BS Extremities- no clubbing, cyanosis, 1+ pedal edema  LINQ interrogation is reviewed  Assessment and Plan:  1. Dizziness, presyncope- resolved No pauses on LINQ since last visit and no presyncope Will continue to monitor  2. Persistent afib Continue anticoagulation Will follow afib burden by LINQ- presently 50% V rates are mostly < 100 bpm Continue tikosyn for now, though she may want to stop this medicine if her afib burden increases Given restriction lung disease, she is not a candidate for amiodarone in the future  3. HTN Stable No change required today  4. Chronic systolic dysfunction euvolemic today Though she meets criteria for an ICD, she is clear that she would prefer to avoid this at this time.  I think that given her advanced age this is the right decision  5. Restrictive lung disease Refer to [name] for management Avoid amiodarone in the future  Return in 3 months to see [name].  She will need bmet upon return I will see again in 6 months

## 2013-07-23 ENCOUNTER — Ambulatory Visit (INDEPENDENT_AMBULATORY_CARE_PROVIDER_SITE_OTHER): Payer: Commercial Managed Care - HMO | Admitting: *Deleted

## 2013-07-23 DIAGNOSIS — R55 Syncope and collapse: Secondary | ICD-10-CM

## 2013-07-23 LAB — MDC_IDC_ENUM_SESS_TYPE_REMOTE

## 2013-07-29 ENCOUNTER — Encounter: Payer: Self-pay | Admitting: Internal Medicine

## 2013-08-09 LAB — MDC_IDC_ENUM_SESS_TYPE_REMOTE

## 2013-08-13 ENCOUNTER — Institutional Professional Consult (permissible substitution): Payer: Medicare HMO | Admitting: Emergency Medicine

## 2013-08-18 ENCOUNTER — Other Ambulatory Visit: Payer: Self-pay | Admitting: Internal Medicine

## 2013-08-20 ENCOUNTER — Ambulatory Visit (INDEPENDENT_AMBULATORY_CARE_PROVIDER_SITE_OTHER): Payer: Commercial Managed Care - HMO | Admitting: *Deleted

## 2013-08-20 DIAGNOSIS — R55 Syncope and collapse: Secondary | ICD-10-CM

## 2013-08-25 ENCOUNTER — Encounter: Payer: Self-pay | Admitting: Internal Medicine

## 2013-09-02 ENCOUNTER — Ambulatory Visit (INDEPENDENT_AMBULATORY_CARE_PROVIDER_SITE_OTHER): Payer: Commercial Managed Care - HMO | Admitting: *Deleted

## 2013-09-02 DIAGNOSIS — Z5181 Encounter for therapeutic drug level monitoring: Secondary | ICD-10-CM | POA: Diagnosis not present

## 2013-09-02 DIAGNOSIS — I4891 Unspecified atrial fibrillation: Secondary | ICD-10-CM | POA: Diagnosis not present

## 2013-09-02 DIAGNOSIS — Z7901 Long term (current) use of anticoagulants: Secondary | ICD-10-CM

## 2013-09-02 LAB — POCT INR: INR: 2.2

## 2013-09-10 ENCOUNTER — Institutional Professional Consult (permissible substitution): Payer: Medicare HMO | Admitting: Emergency Medicine

## 2013-09-22 NOTE — Progress Notes (Signed)
Loop recorder 

## 2013-09-23 ENCOUNTER — Ambulatory Visit (INDEPENDENT_AMBULATORY_CARE_PROVIDER_SITE_OTHER): Payer: Commercial Managed Care - HMO

## 2013-09-23 DIAGNOSIS — R55 Syncope and collapse: Secondary | ICD-10-CM

## 2013-09-23 LAB — MDC_IDC_ENUM_SESS_TYPE_REMOTE

## 2013-10-07 ENCOUNTER — Encounter: Payer: Self-pay | Admitting: Internal Medicine

## 2013-10-07 ENCOUNTER — Other Ambulatory Visit: Payer: Self-pay | Admitting: Internal Medicine

## 2013-10-14 ENCOUNTER — Ambulatory Visit (INDEPENDENT_AMBULATORY_CARE_PROVIDER_SITE_OTHER): Payer: Commercial Managed Care - HMO | Admitting: *Deleted

## 2013-10-14 ENCOUNTER — Other Ambulatory Visit: Payer: Self-pay | Admitting: Internal Medicine

## 2013-10-14 DIAGNOSIS — Z5181 Encounter for therapeutic drug level monitoring: Secondary | ICD-10-CM

## 2013-10-14 DIAGNOSIS — I4891 Unspecified atrial fibrillation: Secondary | ICD-10-CM

## 2013-10-14 DIAGNOSIS — Z7901 Long term (current) use of anticoagulants: Secondary | ICD-10-CM

## 2013-10-14 LAB — POCT INR: INR: 2.9

## 2013-10-22 ENCOUNTER — Ambulatory Visit (INDEPENDENT_AMBULATORY_CARE_PROVIDER_SITE_OTHER): Payer: Commercial Managed Care - HMO | Admitting: *Deleted

## 2013-10-22 DIAGNOSIS — R55 Syncope and collapse: Secondary | ICD-10-CM

## 2013-10-27 NOTE — Progress Notes (Signed)
Loop recorder 

## 2013-11-04 LAB — MDC_IDC_ENUM_SESS_TYPE_REMOTE: MDC IDC SESS DTM: 20150618040500

## 2013-11-06 ENCOUNTER — Encounter: Payer: Self-pay | Admitting: Internal Medicine

## 2013-11-13 ENCOUNTER — Telehealth: Payer: Self-pay | Admitting: Family Medicine

## 2013-11-13 ENCOUNTER — Encounter: Payer: Self-pay | Admitting: Internal Medicine

## 2013-11-13 NOTE — Telephone Encounter (Signed)
Pt made appt w/ dr groat's office to get her eye checked out.  Pt's eye is blood shot, doesn't hurt, feels "full" ; may be bursted blood vessel? *(per pt)  Pt states she has had more occasional headaches than normal. . Pt would like to know if you think this is something she could see you about or keep her eye doctor appt?

## 2013-11-13 NOTE — Telephone Encounter (Signed)
Referral submit  [id number]- start  11/16/2013 - 05/19/2014 [facility]: Dorothyann Gibbs MD  Address: [address], [place], [place] [address]  [phone]

## 2013-11-13 NOTE — Telephone Encounter (Signed)
Pt has appt on Monday at 3:45 w/ dr groat's office. . Pt has seen him for years. [facility] [address], [place], [place] [address]  [phone] Pt needs referral, has [facility]

## 2013-11-13 NOTE — Telephone Encounter (Signed)
Sounds like benign subconjunctival hemorrhage.  We can see her.

## 2013-11-13 NOTE — Telephone Encounter (Signed)
appt scheduled

## 2013-11-16 ENCOUNTER — Ambulatory Visit (INDEPENDENT_AMBULATORY_CARE_PROVIDER_SITE_OTHER): Payer: Commercial Managed Care - HMO | Admitting: Family Medicine

## 2013-11-16 ENCOUNTER — Encounter: Payer: Self-pay | Admitting: Family Medicine

## 2013-11-16 VITALS — BP 124/82 | HR 89 | Temp 97.7°F | Wt 192.0 lb

## 2013-11-16 DIAGNOSIS — H1132 Conjunctival hemorrhage, left eye: Secondary | ICD-10-CM

## 2013-11-16 DIAGNOSIS — H1130 Conjunctival hemorrhage, unspecified eye: Secondary | ICD-10-CM

## 2013-11-16 NOTE — Progress Notes (Signed)
   Subjective:    Patient ID: [name], female    DOB: 12-15-1929, 80 y.o.   MRN: [id number]  Eye Problem  Associated symptoms include eye redness. Pertinent negatives include no eye discharge or photophobia.   2 weeks ago developed some redness and bleeding left eye. She does take Coumadin with history of atrial fibrillation. Most recent INR 2.9. She did not have any associated eye pain or any blurred vision. No other bleeding complications. Her redness is essentially resolved at this time. Denies any history of eye trauma.  Past Medical History  Diagnosis Date  . DIABETES MELLITUS 09/24/2008  . HYPERLIPIDEMIA 09/24/2008  . HYPERTENSION 09/24/2008  . MI 09/24/2008  . CAD 09/24/2008    [facility]I in 1998 tx with PCI to LAD;  myoview 1/12:  no ischemia, EF 45%  . CARDIOMYOPATHY 09/24/2008    ischemic;  echo 4/12:  EF 20-25%, mild MR, mod LAE, mod reduced RVSF, mod RVE, mild RAE, mild TR, PASP 40  . Persistent atrial fibrillation 09/24/2008    s/p DCCV x 4 in past;  Amiod d/c'd 2/2 abnormal PFTs;  Tikosyn load 6/12 with DCCV  . Edema 09/24/2008  . CAROTID BRUIT 09/24/2008  . Anemia   . GERD (gastroesophageal reflux disease)   . Osteopenia   . Tachycardia-bradycardia    Past Surgical History  Procedure Laterality Date  . Fracture surgery  2006    hip  . Knee surgery  2008    broken knee  . Loop recorder implant  11-06-2012    Medtronic LinQ implanted by Dr Johney Frame for palpitaitons and dizziness    reports that she quit smoking about 17 years ago. Her smoking use included Cigarettes. She has a 10 pack-year smoking history. She does not have any smokeless tobacco history on file. She reports that she does not drink alcohol or use illicit drugs. family history includes Alcohol abuse in her brother and father; Cancer in her mother and sister; Coronary artery disease in an other family member; Diabetes in her mother; Heart disease in her mother; Hypertension in an other family member. Allergies  Allergen  Reactions  . Simvastatin     REACTION: rash      Review of Systems  Eyes: Positive for redness. Negative for photophobia, pain, discharge and visual disturbance.  Respiratory: Negative for shortness of breath.   Cardiovascular: Negative for chest pain.       Objective:   Physical Exam  Constitutional: She appears well-developed and well-nourished.  Eyes: Conjunctivae and EOM are normal. Pupils are equal, round, and reactive to light. Right eye exhibits no discharge. No scleral icterus.  Fundi benign.  Cardiovascular: Normal rate.   Pulmonary/Chest: Effort normal and breath sounds normal. No respiratory distress. She has no wheezes. She has no rales.          Assessment & Plan:  Recent subconjunctival hemorrhage left eye. Clinically resolving. Reassurance. Continue close followup regarding her INRs.

## 2013-11-16 NOTE — Patient Instructions (Signed)
Subconjunctival Hemorrhage °A subconjunctival hemorrhage is a bright red patch covering a portion of the white of the eye. The white part of the eye is called the sclera, and it is covered by a thin membrane called the conjunctiva. This membrane is clear, except for tiny blood vessels that you can see with the naked eye. When your eye is irritated or inflamed and becomes red, it is because the vessels in the conjunctiva are swollen. °Sometimes, a blood vessel in the conjunctiva can break and bleed. When this occurs, the blood builds up between the conjunctiva and the sclera, and spreads out to create a red area. The red spot may be very small at first. It may then spread to cover a larger part of the surface of the eye, or even all of the visible white part of the eye. °In almost all cases, the blood will go away and the eye will become white again. Before completely dissolving, however, the red area may spread. It may also become brownish-yellow in color before going away. If a lot of blood collects under the conjunctiva, it may look like a bulge on the surface of the eye. This looks scary, but it will also eventually flatten out and go away. Subconjunctival hemorrhages do not cause pain, but if swollen, may cause a feeling of irritation. There is no effect on vision.  °CAUSES  °· The most common cause is mild trauma (rubbing the eye, irritation). °· Subconjunctival hemorrhages can happen because of coughing or straining (lifting heavy objects), vomiting, or sneezing. °· In some cases, your doctor may want to check your blood pressure. High blood pressure can also cause a subconjunctival hemorrhage. °· Severe trauma or blunt injuries. °· Diseases that affect blood clotting (hemophilia, leukemia). °· Abnormalities of blood vessels behind the eye (carotid cavernous sinus fistula). °· Tumors behind the eye. °· Certain drugs (aspirin, Coumadin, heparin). °· Recent eye surgery. °HOME CARE INSTRUCTIONS  °· Do not worry  about the appearance of your eye. You may continue your usual activities. °· Often, follow-up is not necessary. °SEEK MEDICAL CARE IF:  °· Your eye becomes painful. °· The bleeding does not disappear within 3 weeks. °· Bleeding occurs elsewhere, for example, under the skin, in the mouth, or in the other eye. °· You have recurring subconjunctival hemorrhages. °SEEK IMMEDIATE MEDICAL CARE IF:  °· Your vision changes or you have difficulty seeing. °· You develop a severe headache, persistent vomiting, confusion, or abnormal drowsiness (lethargy). °· Your eye seems to bulge or protrude from the eye socket. °· You notice the sudden appearance of bruises or have spontaneous bleeding elsewhere on your body. °Document Released: 04/09/2005 Document Revised: 08/24/2013 Document Reviewed: 03/07/2009 °ExitCare® Patient Information ©2015 [employer]. This information is not intended to replace advice given to you by your health care provider. Make sure you discuss any questions you have with your health care provider. ° °

## 2013-11-16 NOTE — Progress Notes (Signed)
Pre visit review using our clinic review tool, if applicable. No additional management support is needed unless otherwise documented below in the visit note. 

## 2013-11-20 ENCOUNTER — Ambulatory Visit (INDEPENDENT_AMBULATORY_CARE_PROVIDER_SITE_OTHER): Payer: Commercial Managed Care - HMO | Admitting: *Deleted

## 2013-11-20 DIAGNOSIS — R55 Syncope and collapse: Secondary | ICD-10-CM

## 2013-11-21 DIAGNOSIS — R55 Syncope and collapse: Secondary | ICD-10-CM

## 2013-11-23 ENCOUNTER — Other Ambulatory Visit: Payer: Self-pay | Admitting: Internal Medicine

## 2013-11-25 ENCOUNTER — Ambulatory Visit (INDEPENDENT_AMBULATORY_CARE_PROVIDER_SITE_OTHER): Payer: Commercial Managed Care - HMO | Admitting: Pharmacist

## 2013-11-25 DIAGNOSIS — I4891 Unspecified atrial fibrillation: Secondary | ICD-10-CM

## 2013-11-25 DIAGNOSIS — Z7901 Long term (current) use of anticoagulants: Secondary | ICD-10-CM

## 2013-11-25 DIAGNOSIS — Z5181 Encounter for therapeutic drug level monitoring: Secondary | ICD-10-CM

## 2013-11-25 LAB — POCT INR: INR: 2.6

## 2013-11-25 NOTE — Progress Notes (Signed)
Loop recorder 

## 2013-11-26 LAB — MDC_IDC_ENUM_SESS_TYPE_REMOTE

## 2013-11-30 ENCOUNTER — Encounter: Payer: Self-pay | Admitting: Cardiovascular Disease

## 2013-11-30 ENCOUNTER — Ambulatory Visit (INDEPENDENT_AMBULATORY_CARE_PROVIDER_SITE_OTHER): Payer: Commercial Managed Care - HMO | Admitting: Cardiovascular Disease

## 2013-11-30 VITALS — BP 124/80 | HR 56 | Ht 63.0 in | Wt 191.4 lb

## 2013-11-30 DIAGNOSIS — E7800 Pure hypercholesterolemia, unspecified: Secondary | ICD-10-CM

## 2013-11-30 DIAGNOSIS — E119 Type 2 diabetes mellitus without complications: Secondary | ICD-10-CM

## 2013-11-30 DIAGNOSIS — I2510 Atherosclerotic heart disease of native coronary artery without angina pectoris: Secondary | ICD-10-CM

## 2013-11-30 DIAGNOSIS — I10 Essential (primary) hypertension: Secondary | ICD-10-CM

## 2013-11-30 NOTE — Assessment & Plan Note (Signed)
Good rate control and anticoagulaiton  Low dose tikosyn  Still with some paroxysms but failed higher dose tikosyn, amiodarone and multiple DCC;s  No ablation given age  F/U Allred next month

## 2013-11-30 NOTE — Patient Instructions (Signed)
Your physician wants you to follow-up in:  6 MONTHS WITH DR NISHAN  You will receive a reminder letter in the mail two months in advance. If you don't receive a letter, please call our office to schedule the follow-up appointment. Your physician recommends that you continue on your current medications as directed. Please refer to the Current Medication list given to you today. 

## 2013-11-30 NOTE — Assessment & Plan Note (Signed)
Stable with no angina and good activity level.  Continue medical Rx  

## 2013-11-30 NOTE — Progress Notes (Signed)
Patient ID: [name], female   DOB: Feb 09, 1930, 80 y.o.   MRN: [id number] [name] is a pleasant 80 y.o. WF patient with a h/o persistent atrial fibrillation, CAD, and ischemic CM  Failed cardioversions and decreased  PFTls with amiodarone.    Also failed tikosyn.  Seen by Dr Johney Frame. ILR  placed for SSS  Rate contorl and anticoagulation chosen as long term strategy Reviewed care link reports  ? PAF 60% time.  She is unawareand rate control seems better and in NSR today       ROS: Denies fever, malais, weight loss, blurry vision, decreased visual acuity, cough, sputum, SOB, hemoptysis, pleuritic pain, palpitaitons, heartburn, abdominal pain, melena, lower extremity edema, claudication, or rash.  All other systems reviewed and negative  General: Affect appropriate Healthy:  appears stated age HEENT: normal Neck supple with no adenopathy JVP normal no bruits no thyromegaly Lungs clear with no wheezing and good diaphragmatic motion Heart:  S1/S2 no murmur, no rub, gallop or click PMI normal Abdomen: benighn, BS positve, no tenderness, no AAA no bruit.  No HSM or HJR Distal pulses intact with no bruits Plus one LLE edema with L TKR  Neuro non-focal Skin warm and dry No muscular weakness   Current Outpatient Prescriptions  Medication Sig Dispense Refill  . aspirin 81 MG EC tablet Take 81 mg by mouth daily.        Marland Kitchen atorvastatin (LIPITOR) 10 MG tablet TAKE ONE TABLET BY MOUTH EVERY DAY  90 tablet  1  . Calcium Carbonate-Vitamin D (CALCIUM 500 + D) 500-125 MG-UNIT TABS Take 1 tablet by mouth daily.       Marland Kitchen dofetilide (TIKOSYN) 250 MCG capsule Take 250 mcg by mouth 2 (two) times daily.      . furosemide (LASIX) 40 MG tablet Take 1 tablet (40 mg total) by mouth daily as needed for fluid.  30 tablet  6  . metoprolol (LOPRESSOR) 100 MG tablet TAKE 1 TABLET TWICE DAILY  180 tablet  0  . Multiple Vitamin (MULTIVITAMIN PO) Take 1 tablet by mouth daily.        . nitroGLYCERIN (NITROSTAT)  0.4 MG SL tablet Place 1 tablet (0.4 mg total) under the tongue every 5 (five) minutes as needed.  25 tablet  12  . potassium chloride (MICRO-K) 10 MEQ CR capsule Take 1 capsule (10 mEq total) by mouth daily as needed (for potassium repacement when taking lasix).  90 capsule  0  . ramipril (ALTACE) 10 MG capsule TAKE 2 CAPSULES EVERY DAY  180 capsule  0  . TIKOSYN 250 MCG capsule TAKE ONE CAPSULE BY MOUTH TWICE A DAY  60 capsule  1  . vitamin E 100 UNIT capsule Take 100 Units by mouth daily.        Marland Kitchen warfarin (COUMADIN) 2.5 MG tablet Take as directed by coumadin clinic  105 tablet  1   No current facility-administered medications for this visit.    Allergies  Simvastatin  Electrocardiogram:  afib LVH elevated rate   Assessment and Plan

## 2013-11-30 NOTE — Assessment & Plan Note (Signed)
Cholesterol is at goal.  Continue current dose of statin and diet Rx.  No myalgias or side effects.  F/U  LFT's in 6 months. Lab Results  Component Value Date   LDLCALC 107* 03/23/2013

## 2013-11-30 NOTE — Assessment & Plan Note (Signed)
Discussed low carb diet.  Target hemoglobin A1c is 6.5 or less.  Continue current medications.  

## 2013-11-30 NOTE — Assessment & Plan Note (Signed)
Well controlled.  Continue current medications and low sodium Dash type diet.    

## 2013-12-06 ENCOUNTER — Other Ambulatory Visit: Payer: Self-pay | Admitting: Internal Medicine

## 2013-12-08 ENCOUNTER — Encounter: Payer: Self-pay | Admitting: Internal Medicine

## 2013-12-14 ENCOUNTER — Ambulatory Visit (INDEPENDENT_AMBULATORY_CARE_PROVIDER_SITE_OTHER): Payer: Commercial Managed Care - HMO | Admitting: *Deleted

## 2013-12-14 DIAGNOSIS — R55 Syncope and collapse: Secondary | ICD-10-CM

## 2013-12-14 LAB — MDC_IDC_ENUM_SESS_TYPE_REMOTE

## 2013-12-30 LAB — MDC_IDC_ENUM_SESS_TYPE_REMOTE: Date Time Interrogation Session: 20150508040500

## 2013-12-30 NOTE — Progress Notes (Signed)
Loop recorder 

## 2014-01-06 ENCOUNTER — Encounter: Payer: Self-pay | Admitting: Internal Medicine

## 2014-01-07 ENCOUNTER — Encounter: Payer: Self-pay | Admitting: Internal Medicine

## 2014-01-11 ENCOUNTER — Encounter: Payer: Self-pay | Admitting: Internal Medicine

## 2014-01-11 ENCOUNTER — Ambulatory Visit (INDEPENDENT_AMBULATORY_CARE_PROVIDER_SITE_OTHER): Payer: Commercial Managed Care - HMO | Admitting: Internal Medicine

## 2014-01-11 ENCOUNTER — Ambulatory Visit (INDEPENDENT_AMBULATORY_CARE_PROVIDER_SITE_OTHER): Payer: Commercial Managed Care - HMO

## 2014-01-11 VITALS — BP 127/74 | HR 51 | Ht 63.0 in | Wt 194.2 lb

## 2014-01-11 DIAGNOSIS — Z7901 Long term (current) use of anticoagulants: Secondary | ICD-10-CM

## 2014-01-11 DIAGNOSIS — I4891 Unspecified atrial fibrillation: Secondary | ICD-10-CM | POA: Diagnosis not present

## 2014-01-11 DIAGNOSIS — I428 Other cardiomyopathies: Secondary | ICD-10-CM

## 2014-01-11 DIAGNOSIS — I480 Paroxysmal atrial fibrillation: Secondary | ICD-10-CM

## 2014-01-11 DIAGNOSIS — Z5181 Encounter for therapeutic drug level monitoring: Secondary | ICD-10-CM

## 2014-01-11 DIAGNOSIS — I509 Heart failure, unspecified: Secondary | ICD-10-CM

## 2014-01-11 DIAGNOSIS — I5022 Chronic systolic (congestive) heart failure: Secondary | ICD-10-CM

## 2014-01-11 DIAGNOSIS — I110 Hypertensive heart disease with heart failure: Secondary | ICD-10-CM

## 2014-01-11 LAB — MDC_IDC_ENUM_SESS_TYPE_INCLINIC
MDC IDC SESS DTM: 20150921104822
MDC IDC SET ZONE DETECTION INTERVAL: 2000 ms
Zone Setting Detection Interval: 3000 ms
Zone Setting Detection Interval: 400 ms

## 2014-01-11 LAB — POCT INR: INR: 3.5

## 2014-01-11 NOTE — Patient Instructions (Signed)
Your physician wants you to follow-up in: 6 months with Dr. Allred. You will receive a reminder letter in the mail two months in advance. If you don't receive a letter, please call our office to schedule the follow-up appointment.  

## 2014-01-11 NOTE — Progress Notes (Signed)
PCP: [name], MD Primary Cardiologist:  [name] is a 80 y.o. female patient with a h/o persistent atrial fibrillation, CAD, and ischemic CM who presents today for EP follow-up.  She is doing very well. She maintains an active lifestyle.   She is appropriately anticoagulated with coumadin.  Rate control is better and she is happy with this.  She denies any further symptoms of dizziness, presyncope, or syncope.  She continues to think about stopping tikosyn and proceeding with rate control long term due to costs of tikosyn. Today, she denies symptoms of chest pain, shortness of breath, orthopnea, PND, lower extremity edema,  or neurologic sequela. The patient is tolerating medications without difficulties and is otherwise without complaint today.   Past Medical History  Diagnosis Date  . DIABETES MELLITUS 09/24/2008  . HYPERLIPIDEMIA 09/24/2008  . HYPERTENSION 09/24/2008  . MI 09/24/2008  . CAD 09/24/2008    [facility] in 1998 tx with PCI to LAD;  myoview 1/12:  no ischemia, EF 45%  . CARDIOMYOPATHY 09/24/2008    ischemic;  echo 4/12:  EF 20-25%, mild MR, mod LAE, mod reduced RVSF, mod RVE, mild RAE, mild TR, PASP 40  . Persistent atrial fibrillation 09/24/2008    s/p DCCV x 4 in past;  Amiod d/c'd 2/2 abnormal PFTs;  Tikosyn load 6/12 with DCCV  . Edema 09/24/2008  . CAROTID BRUIT 09/24/2008  . Anemia   . GERD (gastroesophageal reflux disease)   . Osteopenia   . Tachycardia-bradycardia    Past Surgical History  Procedure Laterality Date  . Fracture surgery  2006    hip  . Knee surgery  2008    broken knee  . Loop recorder implant  11-06-2012    Medtronic LinQ implanted by Dr Johney Frame for palpitaitons and dizziness    Current Outpatient Prescriptions  Medication Sig Dispense Refill  . aspirin 81 MG EC tablet Take 81 mg by mouth daily.        Marland Kitchen atorvastatin (LIPITOR) 10 MG tablet TAKE ONE HALF TABLET BY MOUTH EVERY DAY      . Calcium Carbonate-Vitamin D (CALCIUM 500 + D) 500-125  MG-UNIT TABS Take 1 tablet by mouth daily.       Marland Kitchen dofetilide (TIKOSYN) 250 MCG capsule Take 250 mcg by mouth 2 (two) times daily.      . furosemide (LASIX) 40 MG tablet Take 1 tablet (40 mg total) by mouth daily as needed for fluid.  30 tablet  6  . metoprolol (LOPRESSOR) 100 MG tablet TAKE 1 TABLET TWICE DAILY  180 tablet  0  . Multiple Vitamin (MULTIVITAMIN PO) Take 1 tablet by mouth daily.        . nitroGLYCERIN (NITROSTAT) 0.4 MG SL tablet Place 1 tablet (0.4 mg total) under the tongue every 5 (five) minutes as needed.  25 tablet  12  . potassium chloride (MICRO-K) 10 MEQ CR capsule Take 1 capsule (10 mEq total) by mouth daily as needed (for potassium repacement when taking lasix).  90 capsule  0  . ramipril (ALTACE) 10 MG capsule TAKE 2 CAPSULES EVERY DAY  180 capsule  0  . vitamin E 100 UNIT capsule Take 100 Units by mouth daily.        Marland Kitchen warfarin (COUMADIN) 2.5 MG tablet Take as directed by coumadin clinic  105 tablet  1   No current facility-administered medications for this visit.    Allergies  Allergen Reactions  . Simvastatin Rash    History  Social History  . Marital Status: Widowed    Spouse Name: N/A    Number of Children: N/A  . Years of Education: N/A   Occupational History  . Not on file.   Social History Main Topics  . Smoking status: Former Smoker -- 0.50 packs/day for 20 years    Types: Cigarettes    Quit date: 07/11/1996  . Smokeless tobacco: Not on file  . Alcohol Use: No  . Drug Use: No  . Sexual Activity: Not on file   Other Topics Concern  . Not on file   Social History Narrative  . No narrative on file   ROS- all systems are reviewed and negative except as per HPI today  Family History  Problem Relation Age of Onset  . Cancer Mother     breast  . Heart disease Mother   . Diabetes Mother   . Alcohol abuse Father   . Cancer Sister     breast  . Alcohol abuse Brother   . Coronary artery disease    . Hypertension     Physical  Exam: Filed Vitals:   01/11/14 0935  BP: 127/74  Pulse: 51  Height:  (1.6 m)  Weight: 194 lb 3.2 oz (88.089 kg)    GEN- The patient is elderly appearing, alert and oriented x 3 today.   Head- normocephalic, atraumatic Eyes-  Sclera clear, conjunctiva pink Ears- hearing intact Oropharynx- clear Neck- supple, no JVP Lymph- no cervical lymphadenopathy Lungs- Clear to ausculation bilaterally, normal work of breathing Heart- irregular rate and rhythm, no murmurs, rubs or gallops,  GI- soft, NT, ND, + BS Extremities- no clubbing, cyanosis, trace pedal edema  LINQ interrogation is reviewed ekg today reveals sinus rhythm 51 bpm, PR 182, LVH, QTc 416  Assessment and Plan:  1. Dizziness, presyncope- resolved No pauses on LINQ since May Will continue to monitor  2. Persistent afib Continue anticoagulation Will follow afib burden [facility]- presently 56% V rates are mostly < 100 bpm Continue tikosyn for now, though she may want to stop this medicine if her afib burden increases Given restriction lung disease, she is not a candidate for amiodarone in the future  3. Hypertensive cardiovascular disease Stable No change required today  4. Chronic systolic dysfunction euvolemic today Though she meets criteria for an ICD, she is clear that she would prefer to avoid this at this time.  I think that given her advanced age this is the right decision  5. Restrictive lung disease Avoid amiodarone in the future  I will see again in 6 months She is aware that she is due for BMET, Mg today.   She wishes to have this done by primary care but assures me that she will arrange for this every 6 months

## 2014-01-13 ENCOUNTER — Encounter: Payer: Self-pay | Admitting: Internal Medicine

## 2014-01-22 ENCOUNTER — Ambulatory Visit (INDEPENDENT_AMBULATORY_CARE_PROVIDER_SITE_OTHER): Payer: Commercial Managed Care - HMO | Admitting: *Deleted

## 2014-01-22 DIAGNOSIS — R55 Syncope and collapse: Secondary | ICD-10-CM

## 2014-01-25 ENCOUNTER — Other Ambulatory Visit: Payer: Self-pay | Admitting: Internal Medicine

## 2014-01-29 NOTE — Progress Notes (Signed)
Loop recorder 

## 2014-01-31 ENCOUNTER — Other Ambulatory Visit: Payer: Self-pay | Admitting: Internal Medicine

## 2014-02-05 LAB — MDC_IDC_ENUM_SESS_TYPE_REMOTE

## 2014-02-08 ENCOUNTER — Ambulatory Visit (INDEPENDENT_AMBULATORY_CARE_PROVIDER_SITE_OTHER): Payer: Commercial Managed Care - HMO

## 2014-02-08 DIAGNOSIS — Z5181 Encounter for therapeutic drug level monitoring: Secondary | ICD-10-CM

## 2014-02-08 DIAGNOSIS — Z23 Encounter for immunization: Secondary | ICD-10-CM

## 2014-02-08 DIAGNOSIS — Z7901 Long term (current) use of anticoagulants: Secondary | ICD-10-CM

## 2014-02-08 DIAGNOSIS — I4891 Unspecified atrial fibrillation: Secondary | ICD-10-CM

## 2014-02-08 LAB — POCT INR: INR: 2.6

## 2014-02-10 ENCOUNTER — Telehealth: Payer: Self-pay

## 2014-02-10 ENCOUNTER — Encounter: Payer: Self-pay | Admitting: Internal Medicine

## 2014-02-10 MED ORDER — WARFARIN SODIUM 2.5 MG PO TABS: ORAL_TABLET | ORAL | Status: DC

## 2014-02-10 NOTE — Telephone Encounter (Signed)
Rx sent per pt's request.  

## 2014-02-22 ENCOUNTER — Encounter: Payer: Self-pay | Admitting: Family Medicine

## 2014-02-22 ENCOUNTER — Ambulatory Visit (INDEPENDENT_AMBULATORY_CARE_PROVIDER_SITE_OTHER): Payer: Commercial Managed Care - HMO | Admitting: *Deleted

## 2014-02-22 ENCOUNTER — Ambulatory Visit (INDEPENDENT_AMBULATORY_CARE_PROVIDER_SITE_OTHER): Payer: Commercial Managed Care - HMO | Admitting: Family Medicine

## 2014-02-22 VITALS — BP 126/66 | HR 60 | Temp 98.0°F | Wt 196.0 lb

## 2014-02-22 DIAGNOSIS — I119 Hypertensive heart disease without heart failure: Secondary | ICD-10-CM

## 2014-02-22 DIAGNOSIS — R55 Syncope and collapse: Secondary | ICD-10-CM

## 2014-02-22 DIAGNOSIS — E785 Hyperlipidemia, unspecified: Secondary | ICD-10-CM

## 2014-02-22 DIAGNOSIS — E119 Type 2 diabetes mellitus without complications: Secondary | ICD-10-CM

## 2014-02-22 DIAGNOSIS — Z23 Encounter for immunization: Secondary | ICD-10-CM

## 2014-02-22 LAB — BASIC METABOLIC PANEL
BUN: 15 mg/dL (ref 6–23)
CHLORIDE: 101 meq/L (ref 96–112)
CO2: 26 meq/L (ref 19–32)
Calcium: 9.5 mg/dL (ref 8.4–10.5)
Creatinine, Ser: 0.8 mg/dL (ref 0.4–1.2)
GFR: 75.07 mL/min (ref >60.00)
GLUCOSE: 81 mg/dL (ref 70–99)
Potassium: 4.6 mEq/L (ref 3.5–5.1)
Sodium: 136 mEq/L (ref 135–145)

## 2014-02-22 LAB — HEPATIC FUNCTION PANEL
ALBUMIN: 3.3 g/dL — AB (ref 3.5–5.2)
ALT: 15 U/L (ref 0–35)
AST: 18 U/L (ref 0–37)
Alkaline Phosphatase: 74 U/L (ref 39–117)
Bilirubin, Direct: 0.1 mg/dL (ref 0.0–0.3)
Total Bilirubin: 0.8 mg/dL (ref 0.2–1.2)
Total Protein: 7 g/dL (ref 6.0–8.3)

## 2014-02-22 LAB — LIPID PANEL
CHOL/HDL RATIO: 4
Cholesterol: 171 mg/dL (ref 0–200)
HDL: 40.2 mg/dL (ref >39.00)
LDL Cholesterol: 96 mg/dL (ref 0–99)
NONHDL: 130.8
Triglycerides: 173 mg/dL — ABNORMAL HIGH (ref 0.0–149.0)
VLDL: 34.6 mg/dL (ref 0.0–40.0)

## 2014-02-22 LAB — HEMOGLOBIN A1C: Hgb A1c MFr Bld: 5.8 % (ref 4.6–6.5)

## 2014-02-22 MED ORDER — NITROGLYCERIN 0.4 MG SL SUBL: 0.4000 mg | SUBLINGUAL_TABLET | SUBLINGUAL | Status: DC | PRN

## 2014-02-22 NOTE — Progress Notes (Signed)
Subjective:    Patient ID: [name], female    DOB: Dec 29, 1929, 80 y.o.   MRN: [id number]  HPI   80 year old female presents today for 3 month follow up.  She has extensive cardiac history, for which is followed by cardiologist.  She notes no recent changes in health.  Stays very active in her daily life.  Uses a cane for precautionary reasons.  Has not fallen recently.  Denies any joint pains.  No fevers, SOB, chest pains, dizziness or headaches.  Has already gotten a flu shot this year.  Past Medical History  Diagnosis Date  . DIABETES MELLITUS 09/24/2008  . HYPERLIPIDEMIA 09/24/2008  . HYPERTENSION 09/24/2008  . MI 09/24/2008  . CAD 09/24/2008    [facility] in 1998 tx with PCI to LAD;  myoview 1/12:  no ischemia, EF 45%  . CARDIOMYOPATHY 09/24/2008    ischemic;  echo 4/12:  EF 20-25%, mild MR, mod LAE, mod reduced RVSF, mod RVE, mild RAE, mild TR, PASP 40  . Persistent atrial fibrillation 09/24/2008    s/p DCCV x 4 in past;  Amiod d/c'd 2/2 abnormal PFTs;  Tikosyn load 6/12 with DCCV  . Edema 09/24/2008  . CAROTID BRUIT 09/24/2008  . Anemia   . GERD (gastroesophageal reflux disease)   . Osteopenia   . Tachycardia-bradycardia    Past Surgical History  Procedure Laterality Date  . Fracture surgery  2006    hip  . Knee surgery  2008    broken knee  . Loop recorder implant  11-06-2012    Medtronic LinQ implanted by Dr Johney Frame for palpitaitons and dizziness    reports that she quit smoking about 17 years ago. Her smoking use included Cigarettes. She has a 10 pack-year smoking history. She does not have any smokeless tobacco history on file. She reports that she does not drink alcohol or use illicit drugs. family history includes Alcohol abuse in her brother and father; Cancer in her mother and sister; Coronary artery disease in an other family member; Diabetes in her mother; Heart disease in her mother; Hypertension in an other family member. Allergies  Allergen Reactions  . Simvastatin Rash       Review of Systems  Constitutional: Negative for fever, activity change, appetite change, fatigue and unexpected weight change.  HENT: Negative.   Eyes: Negative for pain and visual disturbance.  Respiratory: Negative for cough, choking, chest tightness and shortness of breath.   Cardiovascular: Negative for chest pain and leg swelling.  Gastrointestinal: Negative for nausea, vomiting, abdominal pain, diarrhea and constipation.  Musculoskeletal: Negative for myalgias, joint swelling, arthralgias and gait problem.  Neurological: Negative for dizziness, weakness, light-headedness, numbness and headaches.  Psychiatric/Behavioral: Negative.        Objective:   Physical Exam  Constitutional: She is oriented to person, place, and time. She appears well-developed and well-nourished. No distress.  HENT:  Head: Normocephalic and atraumatic.  Cardiovascular: Normal rate, regular rhythm and normal heart sounds.   No murmur (Currently in NSR) heard. Pulmonary/Chest: Effort normal and breath sounds normal. No respiratory distress. She has no wheezes.  Neurological: She is alert and oriented to person, place, and time.  Skin: She is not diaphoretic.  Psychiatric: She has a normal mood and affect. Her behavior is normal. Judgment and thought content normal.  Nursing note and vitals reviewed.       Assessment & Plan:  1.  Hyperlipidemia- continue on atorvastatin as prescribed.  Ordered lipid panel today.  Last labs were in 2014.   Also check BMP today.  Prevnar 13 was given. 2.  Diabetes- well controlled.  Check A1C today.  Last A1C in 2014 was 6.0.  Continue to watch diet and lifestyle modifications.  Many years ago medication was prescribed; not needed at this time.       Luiz Ochoa, PA-S  As above.  Pt has hypertension well controlled.  All meds reviewed and compliant will all.  No side effects.  [name] MD

## 2014-02-22 NOTE — Patient Instructions (Signed)
Continue current medications as prescribed.

## 2014-02-22 NOTE — Progress Notes (Signed)
Pre visit review using our clinic review tool, if applicable. No additional management support is needed unless otherwise documented below in the visit note. 

## 2014-02-23 LAB — MDC_IDC_ENUM_SESS_TYPE_REMOTE

## 2014-02-23 NOTE — Addendum Note (Signed)
Addended by: Thomasena Edis on: 02/23/2014 08:19 AM   Modules accepted: Orders

## 2014-02-24 NOTE — Progress Notes (Signed)
Loop recorder 

## 2014-03-08 ENCOUNTER — Ambulatory Visit (INDEPENDENT_AMBULATORY_CARE_PROVIDER_SITE_OTHER): Payer: Commercial Managed Care - HMO | Admitting: *Deleted

## 2014-03-08 DIAGNOSIS — Z7901 Long term (current) use of anticoagulants: Secondary | ICD-10-CM

## 2014-03-08 DIAGNOSIS — I4891 Unspecified atrial fibrillation: Secondary | ICD-10-CM

## 2014-03-08 DIAGNOSIS — Z5181 Encounter for therapeutic drug level monitoring: Secondary | ICD-10-CM

## 2014-03-08 LAB — POCT INR: INR: 2.8

## 2014-03-10 ENCOUNTER — Encounter: Payer: Self-pay | Admitting: Internal Medicine

## 2014-03-15 ENCOUNTER — Ambulatory Visit (INDEPENDENT_AMBULATORY_CARE_PROVIDER_SITE_OTHER): Payer: Commercial Managed Care - HMO | Admitting: *Deleted

## 2014-03-15 DIAGNOSIS — R55 Syncope and collapse: Secondary | ICD-10-CM

## 2014-03-15 LAB — MDC_IDC_ENUM_SESS_TYPE_REMOTE

## 2014-03-26 NOTE — Progress Notes (Signed)
Loop recorder 

## 2014-04-01 ENCOUNTER — Encounter (HOSPITAL_COMMUNITY): Payer: Self-pay | Admitting: Internal Medicine

## 2014-04-05 ENCOUNTER — Other Ambulatory Visit: Payer: Self-pay | Admitting: Internal Medicine

## 2014-04-19 ENCOUNTER — Ambulatory Visit (INDEPENDENT_AMBULATORY_CARE_PROVIDER_SITE_OTHER): Payer: Commercial Managed Care - HMO | Admitting: Pharmacist

## 2014-04-19 DIAGNOSIS — Z7901 Long term (current) use of anticoagulants: Secondary | ICD-10-CM

## 2014-04-19 DIAGNOSIS — I4891 Unspecified atrial fibrillation: Secondary | ICD-10-CM

## 2014-04-19 DIAGNOSIS — Z5181 Encounter for therapeutic drug level monitoring: Secondary | ICD-10-CM

## 2014-04-19 LAB — POCT INR: INR: 3.3

## 2014-04-26 ENCOUNTER — Encounter: Payer: Self-pay | Admitting: Internal Medicine

## 2014-04-29 ENCOUNTER — Other Ambulatory Visit: Payer: Self-pay | Admitting: *Deleted

## 2014-04-29 MED ORDER — ATORVASTATIN CALCIUM 10 MG PO TABS: 5.0000 mg | ORAL_TABLET | Freq: Every day | ORAL | Status: DC

## 2014-04-29 MED ORDER — METOPROLOL TARTRATE 100 MG PO TABS: 100.0000 mg | ORAL_TABLET | Freq: Two times a day (BID) | ORAL | Status: DC

## 2014-04-29 MED ORDER — DOFETILIDE 250 MCG PO CAPS: 250.0000 ug | ORAL_CAPSULE | Freq: Two times a day (BID) | ORAL | Status: DC

## 2014-04-29 MED ORDER — WARFARIN SODIUM 2.5 MG PO TABS: ORAL_TABLET | ORAL | Status: DC

## 2014-04-29 MED ORDER — POTASSIUM CHLORIDE ER 10 MEQ PO CPCR: 10.0000 meq | ORAL_CAPSULE | Freq: Every day | ORAL | Status: DC | PRN

## 2014-04-29 MED ORDER — RAMIPRIL 10 MG PO CAPS: 20.0000 mg | ORAL_CAPSULE | Freq: Every day | ORAL | Status: DC

## 2014-04-29 MED ORDER — FUROSEMIDE 40 MG PO TABS: 40.0000 mg | ORAL_TABLET | Freq: Every day | ORAL | Status: DC | PRN

## 2014-05-11 ENCOUNTER — Encounter: Payer: Self-pay | Admitting: Cardiovascular Disease

## 2014-05-11 ENCOUNTER — Encounter: Payer: Self-pay | Admitting: Cardiology

## 2014-05-24 ENCOUNTER — Ambulatory Visit (INDEPENDENT_AMBULATORY_CARE_PROVIDER_SITE_OTHER): Payer: PPO

## 2014-05-24 DIAGNOSIS — Z5181 Encounter for therapeutic drug level monitoring: Secondary | ICD-10-CM

## 2014-05-24 DIAGNOSIS — I4891 Unspecified atrial fibrillation: Secondary | ICD-10-CM

## 2014-05-24 DIAGNOSIS — Z7901 Long term (current) use of anticoagulants: Secondary | ICD-10-CM

## 2014-05-24 LAB — POCT INR: INR: 2.9

## 2014-06-08 ENCOUNTER — Encounter: Payer: Self-pay | Admitting: Cardiology

## 2014-06-08 ENCOUNTER — Telehealth: Payer: Self-pay | Admitting: Cardiology

## 2014-06-08 NOTE — Telephone Encounter (Signed)
LMOVM requesting that pt send manual transmission b/c home monitor has been disconnected.   

## 2014-06-15 ENCOUNTER — Encounter: Payer: Self-pay | Admitting: Internal Medicine

## 2014-06-18 ENCOUNTER — Encounter: Payer: Self-pay | Admitting: Cardiology

## 2014-06-20 NOTE — Progress Notes (Signed)
Patient ID: [name], female   DOB: 1930-02-16, 81 y.o.   MRN: [id number] [name] is a pleasant 81 y.o. . WF patient with a h/o persistent atrial fibrillation, CAD, and ischemic CM  Failed cardioversions and decreased  PFTls with amiodarone.    Also failed tikosyn.  Seen by Dr Johney Frame. ILR  placed for SSS  Rate contorl and anticoagulation chosen as long term strategy Reviewed care link reports  ? PAF 79% time.  Saw Dr Johney Frame and considering stopping Tikosyn due to cost.  Rate control and anticoagulation would be reasonable Has restrictive lung disease and not a candidate for amiodarone  Discussed with daughter and patient will continue but may stop during donut hole period   ROS: Denies fever, malais, weight loss, blurry vision, decreased visual acuity, cough, sputum, SOB, hemoptysis, pleuritic pain, palpitaitons, heartburn, abdominal pain, melena, lower extremity edema, claudication, or rash.  All other systems reviewed and negative  General: Affect appropriate Healthy:  appears stated age HEENT: normal Neck supple with no adenopathy JVP normal no bruits no thyromegaly Lungs clear with no wheezing and good diaphragmatic motion Heart:  S1/S2 no murmur, no rub, gallop or click PMI normal Abdomen: benighn, BS positve, no tenderness, no AAA no bruit.  No HSM or HJR Distal pulses intact with no bruits Plus one LLE edema with L TKR  Neuro non-focal Skin warm and dry No muscular weakness   Current Outpatient Prescriptions  Medication Sig Dispense Refill  . aspirin 81 MG EC tablet Take 81 mg by mouth daily.      Marland Kitchen atorvastatin (LIPITOR) 10 MG tablet Take 0.5 tablets (5 mg total) by mouth daily. 45 tablet 1  . Calcium Carbonate-Vitamin D (CALCIUM 500 + D) 500-125 MG-UNIT TABS Take 1 tablet by mouth daily.     Marland Kitchen dofetilide (TIKOSYN) 250 MCG capsule Take 1 capsule (250 mcg total) by mouth 2 (two) times daily. 180 capsule 1  . furosemide (LASIX) 40 MG tablet Take 1 tablet (40 mg total) by  mouth daily as needed for fluid. 90 tablet 1  . metoprolol (LOPRESSOR) 100 MG tablet Take 1 tablet (100 mg total) by mouth 2 (two) times daily. 180 tablet 1  . Multiple Vitamin (MULTIVITAMIN PO) Take 1 tablet by mouth daily.      . nitroGLYCERIN (NITROSTAT) 0.4 MG SL tablet Place 1 tablet (0.4 mg total) under the tongue every 5 (five) minutes as needed. 25 tablet 12  . potassium chloride (MICRO-K) 10 MEQ CR capsule Take 1 capsule (10 mEq total) by mouth daily as needed (for potassium repacement when taking lasix). 90 capsule 1  . ramipril (ALTACE) 10 MG capsule Take 2 capsules (20 mg total) by mouth daily. 180 capsule 1  . vitamin E 100 UNIT capsule Take 100 Units by mouth daily.      Marland Kitchen warfarin (COUMADIN) 2.5 MG tablet Take as directed by coumadin clinic 105 tablet 1   No current facility-administered medications for this visit.    Allergies  Simvastatin  Electrocardiogram:  afib LVH elevated rate 4/15  On 01/11/14  afib rate 51 V pacing   Assessment and Plan

## 2014-06-21 ENCOUNTER — Ambulatory Visit (INDEPENDENT_AMBULATORY_CARE_PROVIDER_SITE_OTHER): Payer: PPO | Admitting: Cardiovascular Disease

## 2014-06-21 ENCOUNTER — Ambulatory Visit (INDEPENDENT_AMBULATORY_CARE_PROVIDER_SITE_OTHER): Payer: PPO | Admitting: *Deleted

## 2014-06-21 ENCOUNTER — Encounter: Payer: Self-pay | Admitting: Cardiovascular Disease

## 2014-06-21 VITALS — BP 134/70 | HR 60 | Ht 65.0 in | Wt 201.8 lb

## 2014-06-21 DIAGNOSIS — I480 Paroxysmal atrial fibrillation: Secondary | ICD-10-CM

## 2014-06-21 DIAGNOSIS — E7800 Pure hypercholesterolemia, unspecified: Secondary | ICD-10-CM

## 2014-06-21 DIAGNOSIS — R55 Syncope and collapse: Secondary | ICD-10-CM

## 2014-06-21 DIAGNOSIS — I4891 Unspecified atrial fibrillation: Secondary | ICD-10-CM

## 2014-06-21 DIAGNOSIS — Z7901 Long term (current) use of anticoagulants: Secondary | ICD-10-CM

## 2014-06-21 DIAGNOSIS — Z5181 Encounter for therapeutic drug level monitoring: Secondary | ICD-10-CM

## 2014-06-21 DIAGNOSIS — I119 Hypertensive heart disease without heart failure: Secondary | ICD-10-CM

## 2014-06-21 DIAGNOSIS — I2510 Atherosclerotic heart disease of native coronary artery without angina pectoris: Secondary | ICD-10-CM

## 2014-06-21 LAB — POCT INR: INR: 2.7

## 2014-06-21 NOTE — Assessment & Plan Note (Signed)
Cholesterol is at goal.  Continue current dose of statin and diet Rx.  No myalgias or side effects.  F/U  LFT's in 6 months. Lab Results  Component Value Date   LDLCALC 96 02/22/2014

## 2014-06-21 NOTE — Patient Instructions (Signed)
Your physician wants you to follow-up in:  6 MONTHS WITH DR NISHAN  You will receive a reminder letter in the mail two months in advance. If you don't receive a letter, please call our office to schedule the follow-up appointment. Your physician recommends that you continue on your current medications as directed. Please refer to the Current Medication list given to you today. 

## 2014-06-21 NOTE — Assessment & Plan Note (Signed)
Stable with no angina and good activity level.  Continue medical Rx  

## 2014-06-21 NOTE — Assessment & Plan Note (Addendum)
Continue tikosyn  On Rx anticoagulation  F/U Dr Johney Frame for further interrogation of loop recorder

## 2014-06-21 NOTE — Assessment & Plan Note (Signed)
Well controlled.  Continue current medications and low sodium Dash type diet.    

## 2014-06-22 LAB — MDC_IDC_ENUM_SESS_TYPE_REMOTE
Date Time Interrogation Session: 20160209035417
Zone Setting Detection Interval: 2000 ms
Zone Setting Detection Interval: 3000 ms
Zone Setting Detection Interval: 400 ms

## 2014-06-24 NOTE — Progress Notes (Signed)
Loop recorder 

## 2014-07-16 ENCOUNTER — Encounter: Payer: Self-pay | Admitting: Cardiology

## 2014-07-19 ENCOUNTER — Ambulatory Visit (INDEPENDENT_AMBULATORY_CARE_PROVIDER_SITE_OTHER): Payer: PPO | Admitting: *Deleted

## 2014-07-19 ENCOUNTER — Encounter: Payer: Self-pay | Admitting: Internal Medicine

## 2014-07-19 ENCOUNTER — Ambulatory Visit (INDEPENDENT_AMBULATORY_CARE_PROVIDER_SITE_OTHER): Payer: PPO | Admitting: Internal Medicine

## 2014-07-19 VITALS — BP 132/76 | HR 55 | Ht 65.0 in | Wt 202.0 lb

## 2014-07-19 DIAGNOSIS — Z79899 Other long term (current) drug therapy: Secondary | ICD-10-CM | POA: Diagnosis not present

## 2014-07-19 DIAGNOSIS — Z7901 Long term (current) use of anticoagulants: Secondary | ICD-10-CM | POA: Diagnosis not present

## 2014-07-19 DIAGNOSIS — Z5181 Encounter for therapeutic drug level monitoring: Secondary | ICD-10-CM | POA: Diagnosis not present

## 2014-07-19 DIAGNOSIS — I4891 Unspecified atrial fibrillation: Secondary | ICD-10-CM

## 2014-07-19 DIAGNOSIS — I119 Hypertensive heart disease without heart failure: Secondary | ICD-10-CM

## 2014-07-19 DIAGNOSIS — I5022 Chronic systolic (congestive) heart failure: Secondary | ICD-10-CM | POA: Diagnosis not present

## 2014-07-19 LAB — MDC_IDC_ENUM_SESS_TYPE_INCLINIC

## 2014-07-19 LAB — BASIC METABOLIC PANEL
BUN: 14 mg/dL (ref 6–23)
CHLORIDE: 102 meq/L (ref 96–112)
CO2: 34 meq/L — AB (ref 19–32)
Calcium: 9.9 mg/dL (ref 8.4–10.5)
Creatinine, Ser: 0.7 mg/dL (ref 0.40–1.20)
GFR: 84.97 mL/min (ref >60.00)
Glucose, Bld: 98 mg/dL (ref 70–99)
Potassium: 4.4 mEq/L (ref 3.5–5.1)
SODIUM: 137 meq/L (ref 135–145)

## 2014-07-19 LAB — MAGNESIUM: MAGNESIUM: 2 mg/dL (ref 1.5–2.5)

## 2014-07-19 LAB — POCT INR: INR: 2.8

## 2014-07-19 NOTE — Progress Notes (Deleted)
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  She is doing very well. She maintains an active lifestyle.   She is appropriately anticoagulated with coumadin.  Rate control is better, per Linq report afib burden is less, now at 43%. She denies any fast heart beat. and she is happy with this.  She denies any further symptoms of dizziness, presyncope, or syncope.  She continues to think about stopping tikosyn and proceeding with rate control long term due to costs of tikosyn, but so far content on tikosyn.. Today, she denies symptoms of chest pain, shortness of breath, orthopnea, PND, lower extremity edema,  or neurologic sequela. The patient is tolerating medications without difficulties and is otherwise without complaint today.   Past Medical History  Diagnosis Date  . DIABETES MELLITUS 09/24/2008  . HYPERLIPIDEMIA 09/24/2008  . HYPERTENSION 09/24/2008  . MI 09/24/2008  . CAD 09/24/2008    [facility] in 1998 tx with PCI to LAD;  myoview 1/12:  no ischemia, EF 45%  . CARDIOMYOPATHY 09/24/2008    ischemic;  echo 4/12:  EF 20-25%, mild MR, mod LAE, mod reduced RVSF, mod RVE, mild RAE, mild TR, PASP 40  . Persistent atrial fibrillation 09/24/2008    s/p DCCV x 4 in past;  Amiod d/c'd 2/2 abnormal PFTs;  Tikosyn load 6/12 with DCCV  . Edema 09/24/2008  . CAROTID BRUIT 09/24/2008  . Anemia   . GERD (gastroesophageal reflux disease)   . Osteopenia   . Tachycardia-bradycardia    Past Surgical History  Procedure Laterality Date  . Fracture surgery  2006    hip  . Knee surgery  2008    broken knee  . Loop recorder implant  11-06-2012    Medtronic LinQ implanted by Dr Johney Frame for palpitaitons and dizziness  . Loop recorder implant N/A 11/06/2012    Procedure: LOOP RECORDER IMPLANT;  Surgeon: Hillis Range, MD;  Location: [facility] CATH LAB;  Service: Cardiovascular;   Laterality: N/A;    Current Outpatient Prescriptions  Medication Sig Dispense Refill  . aspirin 81 MG EC tablet Take 81 mg by mouth daily.      Marland Kitchen atorvastatin (LIPITOR) 10 MG tablet Take 0.5 tablets (5 mg total) by mouth daily. 45 tablet 1  . Calcium Carbonate-Vitamin D (CALCIUM 500 + D) 500-125 MG-UNIT TABS Take 1 tablet by mouth daily.     Marland Kitchen dofetilide (TIKOSYN) 250 MCG capsule Take 1 capsule (250 mcg total) by mouth 2 (two) times daily. 180 capsule 1  . furosemide (LASIX) 40 MG tablet Take 1 tablet (40 mg total) by mouth daily as needed for fluid. 90 tablet 1  . metoprolol (LOPRESSOR) 100 MG tablet Take 1 tablet (100 mg total) by mouth 2 (two) times daily. 180 tablet 1  . Multiple Vitamin (MULTIVITAMIN PO) Take 1 tablet by mouth daily.      . nitroGLYCERIN (NITROSTAT) 0.4 MG SL tablet Place 1 tablet (0.4 mg total) under the tongue every 5 (five) minutes as needed. (Patient taking differently: Place 0.4 mg under the tongue every 5 (five) minutes as needed for chest pain (MAX 3 TABLETS). ) 25 tablet 12  . potassium chloride (MICRO-K) 10 MEQ CR capsule Take 1 capsule (10 mEq total) by mouth daily as needed (for potassium repacement when taking lasix). 90 capsule 1  . ramipril (ALTACE) 10 MG capsule Take 2  capsules (20 mg total) by mouth daily. 180 capsule 1  . vitamin E 100 UNIT capsule Take 100 Units by mouth daily.      Marland Kitchen warfarin (COUMADIN) 2.5 MG tablet Take as directed by coumadin clinic 105 tablet 1   No current facility-administered medications for this visit.    Allergies  Allergen Reactions  . Simvastatin Rash    History   Social History  . Marital Status: Widowed    Spouse Name: N/A  . Number of Children: N/A  . Years of Education: N/A   Occupational History  . Not on file.   Social History Main Topics  . Smoking status: Former Smoker -- 0.50 packs/day for 20 years    Types: Cigarettes    Quit date: 07/11/1996  . Smokeless tobacco: Not on file  . Alcohol Use: No   . Drug Use: No  . Sexual Activity: Not on file   Other Topics Concern  . Not on file   Social History Narrative   ROS- all systems are reviewed and negative except as per HPI today  Family History  Problem Relation Age of Onset  . Cancer Mother     breast  . Heart disease Mother   . Diabetes Mother   . Alcohol abuse Father   . Cancer Sister     breast  . Brain cancer Sister     cause of death at 51 years old  . Alcohol abuse Brother   . Heart disease Brother   . Coronary artery disease    . Hypertension    . Heart disease Brother   . Cancer Brother     unknown type   Physical Exam: Filed Vitals:   07/19/14 1117  BP: 132/76  Pulse: 55  Height: 5\' 5"  (1.651 m)  Weight: 202 lb (91.627 kg)    GEN- The patient is elderly appearing, alert and oriented x 3 today.   Head- normocephalic, atraumatic Eyes-  Sclera clear, conjunctiva pink Ears- hearing intact Oropharynx- clear Neck- supple, no JVP Lymph- no cervical lymphadenopathy Lungs- Clear to ausculation bilaterally, normal work of breathing Heart- irregular rate and rhythm, no murmurs, rubs or gallops,  GI- soft, NT, ND, + BS Extremities- no clubbing, cyanosis, trace pedal edema  LINQ interrogation is reviewed, afib burden 43.9% ekg today reveals sinus brady, 55 bpm with repolariaztion abnormality, unchanged from previous  Assessment and Plan:  1. Dizziness, presyncope- resolved No pauses on LINQ since May Will continue to monitor  2. Persistent afib Continue anticoagulation Will follow afib burden by LINQ- presently 43% Continue tikosyn for now, though she may want to stop this medicine if her afib burden increases Bmet/mag today Given restriction lung disease, she is not a candidate for amiodarone in the future  3. Hypertensive cardiovascular disease Stable No change required today  4. Chronic systolic dysfunction euvolemic today Though she meets criteria for an ICD, she is clear that she would  prefer to avoid this at this time.  I think that given her advanced age this is the right decision  5. Restrictive lung disease Avoid amiodarone in the future  F/u 6 months in afib clinic Dr. Johney Frame in one year

## 2014-07-19 NOTE — Patient Instructions (Addendum)
Your physician wants you to follow-up in: 12 months with Dr Jacquiline Doe will receive a reminder letter in the mail two months in advance. If you don't receive a letter, please call our office to schedule the follow-up appointment.  Your physician wants you to follow-up in: 6 months with Rudi Coco NP in A. Fib clinic.  Patient prefers AM appointment. You will receive a reminder letter in the mail two months in advance. If you don't receive a letter, please call our office to schedule the follow-up appointment.  Your physician recommends that you have lab work today Mg and BMET.

## 2014-07-20 NOTE — Progress Notes (Signed)
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  She is doing very well. She maintains an active lifestyle.   She is appropriately anticoagulated with coumadin.  Rate control is better, per Linq report afib burden is less, now at 43%. She denies any fast heart beat. and she is happy with this.  She denies any further symptoms of dizziness, presyncope, or syncope.  She continues to think about stopping tikosyn and proceeding with rate control long term due to costs of tikosyn, but so far content on tikosyn.. Today, she denies symptoms of chest pain, shortness of breath, orthopnea, PND, lower extremity edema,  or neurologic sequela. The patient is tolerating medications without difficulties and is otherwise without complaint today.   Past Medical History  Diagnosis Date  . DIABETES MELLITUS 09/24/2008  . HYPERLIPIDEMIA 09/24/2008  . HYPERTENSION 09/24/2008  . MI 09/24/2008  . CAD 09/24/2008    [facility] in 1998 tx with PCI to LAD;  myoview 1/12:  no ischemia, EF 45%  . CARDIOMYOPATHY 09/24/2008    ischemic;  echo 4/12:  EF 20-25%, mild MR, mod LAE, mod reduced RVSF, mod RVE, mild RAE, mild TR, PASP 40  . Persistent atrial fibrillation 09/24/2008    s/p DCCV x 4 in past;  Amiod d/c'd 2/2 abnormal PFTs;  Tikosyn load 6/12 with DCCV  . Edema 09/24/2008  . CAROTID BRUIT 09/24/2008  . Anemia   . GERD (gastroesophageal reflux disease)   . Osteopenia   . Tachycardia-bradycardia    Past Surgical History  Procedure Laterality Date  . Fracture surgery  2006    hip  . Knee surgery  2008    broken knee  . Loop recorder implant  11-06-2012    Medtronic LinQ implanted by Dr Johney Frame for palpitaitons and dizziness  . Loop recorder implant N/A 11/06/2012    Procedure: LOOP RECORDER IMPLANT;  Surgeon: Hillis Range, MD;  Location: [facility] C[facility] LAB;  Service: Cardiovascular;   Laterality: N/A;    Current Outpatient Prescriptions  Medication Sig Dispense Refill  . aspirin 81 MG EC tablet Take 81 mg by mouth daily.      Marland Kitchen atorvastatin (LIPITOR) 10 MG tablet Take 0.5 tablets (5 mg total) by mouth daily. 45 tablet 1  . Calcium Carbonate-Vitamin D (CALCIUM 500 + D) 500-125 MG-UNIT TABS Take 1 tablet by mouth daily.     Marland Kitchen dofetilide (TIKOSYN) 250 MCG capsule Take 1 capsule (250 mcg total) by mouth 2 (two) times daily. 180 capsule 1  . furosemide (LASIX) 40 MG tablet Take 1 tablet (40 mg total) by mouth daily as needed for fluid. 90 tablet 1  . metoprolol (LOPRESSOR) 100 MG tablet Take 1 tablet (100 mg total) by mouth 2 (two) times daily. 180 tablet 1  . Multiple Vitamin (MULTIVITAMIN PO) Take 1 tablet by mouth daily.      . nitroGLYCERIN (NITROSTAT) 0.4 MG SL tablet Place 1 tablet (0.4 mg total) under the tongue every 5 (five) minutes as needed. (Patient taking differently: Place 0.4 mg under the tongue every 5 (five) minutes as needed for chest pain (MAX 3 TABLETS). ) 25 tablet 12  . potassium chloride (MICRO-K) 10 MEQ CR capsule Take 1 capsule (10 mEq total) by mouth daily as needed (for potassium repacement when taking lasix). 90 capsule 1  . ramipril (ALTACE) 10 MG capsule Take 2  capsules (20 mg total) by mouth daily. 180 capsule 1  . vitamin E 100 UNIT capsule Take 100 Units by mouth daily.      Marland Kitchen warfarin (COUMADIN) 2.5 MG tablet Take as directed by coumadin clinic 105 tablet 1   No current facility-administered medications for this visit.    Allergies  Allergen Reactions  . Simvastatin Rash    History   Social History  . Marital Status: Widowed    Spouse Name: N/A  . Number of Children: N/A  . Years of Education: N/A   Occupational History  . Not on file.   Social History Main Topics  . Smoking status: Former Smoker -- 0.50 packs/day for 20 years    Types: Cigarettes    Quit date: 07/11/1996  . Smokeless tobacco: Not on file  . Alcohol Use: No   . Drug Use: No  . Sexual Activity: Not on file   Other Topics Concern  . Not on file   Social History Narrative   ROS- all systems are reviewed and negative except as per HPI today  Family History  Problem Relation Age of Onset  . Cancer Mother     breast  . Heart disease Mother   . Diabetes Mother   . Alcohol abuse Father   . Cancer Sister     breast  . Brain cancer Sister     cause of death at 72 years old  . Alcohol abuse Brother   . Heart disease Brother   . Coronary artery disease    . Hypertension    . Heart disease Brother   . Cancer Brother     unknown type   Physical Exam: Filed Vitals:   07/19/14 1117  BP: 132/76  Pulse: 55  Height: 5\' 5"  (1.651 m)  Weight: 202 lb (91.627 kg)    GEN- The patient is elderly appearing, alert and oriented x 3 today.   Head- normocephalic, atraumatic Eyes-  Sclera clear, conjunctiva pink Ears- hearing intact Oropharynx- clear Neck- supple, no JVP Lymph- no cervical lymphadenopathy Lungs- Clear to ausculation bilaterally, normal work of breathing Heart- irregular rate and rhythm, no murmurs, rubs or gallops,  GI- soft, NT, ND, + BS Extremities- no clubbing, cyanosis, trace pedal edema  LINQ interrogation is reviewed, afib burden 43.9% ekg today reveals sinus brady, 55 bpm with repolariaztion abnormality, unchanged from previous  Assessment and Plan:  1. Dizziness, presyncope- resolved No pauses on LINQ since May Will continue to monitor  2. Persistent afib Continue anticoagulation Will follow afib burden by LINQ- presently 43% Continue tikosyn for now, though she may want to stop this medicine if her afib burden increases Bmet/mag today Given restriction lung disease, she is not a candidate for amiodarone in the future  3. Hypertensive cardiovascular disease Stable No change required today  4. Chronic systolic dysfunction euvolemic today Though she meets criteria for an ICD, she is clear that she would  prefer to avoid this at this time.  I think that given her advanced age this is the right decision  5. Restrictive lung disease Avoid amiodarone in the future  F/u 6 months in afib clinic I will see in one year

## 2014-07-21 ENCOUNTER — Encounter: Payer: Self-pay | Admitting: Internal Medicine

## 2014-07-21 ENCOUNTER — Ambulatory Visit (INDEPENDENT_AMBULATORY_CARE_PROVIDER_SITE_OTHER): Payer: PPO | Admitting: *Deleted

## 2014-07-21 DIAGNOSIS — R55 Syncope and collapse: Secondary | ICD-10-CM

## 2014-07-22 NOTE — Progress Notes (Signed)
Loop recorder 

## 2014-07-23 ENCOUNTER — Encounter: Payer: Self-pay | Admitting: Cardiology

## 2014-07-25 ENCOUNTER — Encounter: Payer: Self-pay | Admitting: Internal Medicine

## 2014-08-10 LAB — MDC_IDC_ENUM_SESS_TYPE_REMOTE
MDC IDC SESS DTM: 20160404011637
MDC IDC SET ZONE DETECTION INTERVAL: 3000 ms
Zone Setting Detection Interval: 2000 ms
Zone Setting Detection Interval: 400 ms

## 2014-08-16 ENCOUNTER — Ambulatory Visit (INDEPENDENT_AMBULATORY_CARE_PROVIDER_SITE_OTHER): Payer: PPO | Admitting: *Deleted

## 2014-08-16 DIAGNOSIS — Z7901 Long term (current) use of anticoagulants: Secondary | ICD-10-CM

## 2014-08-16 DIAGNOSIS — Z5181 Encounter for therapeutic drug level monitoring: Secondary | ICD-10-CM

## 2014-08-16 DIAGNOSIS — I4891 Unspecified atrial fibrillation: Secondary | ICD-10-CM

## 2014-08-16 LAB — POCT INR: INR: 3.6

## 2014-08-20 ENCOUNTER — Encounter: Payer: Self-pay | Admitting: Internal Medicine

## 2014-08-20 ENCOUNTER — Ambulatory Visit (INDEPENDENT_AMBULATORY_CARE_PROVIDER_SITE_OTHER): Payer: PPO | Admitting: *Deleted

## 2014-08-20 DIAGNOSIS — R55 Syncope and collapse: Secondary | ICD-10-CM | POA: Diagnosis not present

## 2014-08-23 NOTE — Progress Notes (Signed)
Loop recorder 

## 2014-08-25 ENCOUNTER — Encounter: Payer: Self-pay | Admitting: Internal Medicine

## 2014-08-30 ENCOUNTER — Telehealth: Payer: Self-pay | Admitting: *Deleted

## 2014-08-30 ENCOUNTER — Ambulatory Visit (INDEPENDENT_AMBULATORY_CARE_PROVIDER_SITE_OTHER): Payer: PPO | Admitting: *Deleted

## 2014-08-30 DIAGNOSIS — Z5181 Encounter for therapeutic drug level monitoring: Secondary | ICD-10-CM

## 2014-08-30 DIAGNOSIS — I4891 Unspecified atrial fibrillation: Secondary | ICD-10-CM

## 2014-08-30 DIAGNOSIS — Z7901 Long term (current) use of anticoagulants: Secondary | ICD-10-CM | POA: Diagnosis not present

## 2014-08-30 LAB — POCT INR: INR: 3.1

## 2014-08-30 NOTE — Telephone Encounter (Signed)
JA reviewed pause episode from 08-27-14 with symptoms. Pt was called to offer early appointment to discuss device implantation. Pt would like to talk with daughter, Darl Pikes. Direct line was given for daughter to call to ask questions. Pt works at night time so may not call until Wednesday.

## 2014-09-01 NOTE — Telephone Encounter (Signed)
Spoke w/pt and daughter is suppose to call office today. Pt to try and get in touch with daughter to remind daughter to call.

## 2014-09-08 LAB — CUP PACEART REMOTE DEVICE CHECK: Date Time Interrogation Session: 20160508040500

## 2014-09-08 NOTE — Telephone Encounter (Signed)
Spoke w/daughter and appt was made with JA.

## 2014-09-13 ENCOUNTER — Ambulatory Visit (INDEPENDENT_AMBULATORY_CARE_PROVIDER_SITE_OTHER): Payer: PPO

## 2014-09-13 DIAGNOSIS — Z7901 Long term (current) use of anticoagulants: Secondary | ICD-10-CM | POA: Diagnosis not present

## 2014-09-13 DIAGNOSIS — I4891 Unspecified atrial fibrillation: Secondary | ICD-10-CM | POA: Diagnosis not present

## 2014-09-13 DIAGNOSIS — Z5181 Encounter for therapeutic drug level monitoring: Secondary | ICD-10-CM | POA: Diagnosis not present

## 2014-09-13 LAB — POCT INR: INR: 3.2

## 2014-09-15 ENCOUNTER — Encounter: Payer: Self-pay | Admitting: Internal Medicine

## 2014-09-15 ENCOUNTER — Encounter: Payer: Self-pay | Admitting: *Deleted

## 2014-09-15 ENCOUNTER — Ambulatory Visit (INDEPENDENT_AMBULATORY_CARE_PROVIDER_SITE_OTHER): Payer: PPO | Admitting: Internal Medicine

## 2014-09-15 VITALS — BP 118/90 | HR 95 | Ht 64.0 in | Wt 202.2 lb

## 2014-09-15 DIAGNOSIS — I5022 Chronic systolic (congestive) heart failure: Secondary | ICD-10-CM

## 2014-09-15 DIAGNOSIS — I495 Sick sinus syndrome: Secondary | ICD-10-CM | POA: Diagnosis not present

## 2014-09-15 DIAGNOSIS — I480 Paroxysmal atrial fibrillation: Secondary | ICD-10-CM | POA: Diagnosis not present

## 2014-09-15 DIAGNOSIS — I119 Hypertensive heart disease without heart failure: Secondary | ICD-10-CM | POA: Diagnosis not present

## 2014-09-15 LAB — CUP PACEART INCLINIC DEVICE CHECK
Date Time Interrogation Session: 20160525174811
MDC IDC SET ZONE DETECTION INTERVAL: 3000 ms
Zone Setting Detection Interval: 2000 ms
Zone Setting Detection Interval: 400 ms

## 2014-09-15 NOTE — Patient Instructions (Addendum)
Medication Instructions:  Your physician recommends that you continue on your current medications as directed. Please refer to the Current Medication list given to you today.   Labwork: Will need weekly INR's starting next week and will get pre-op labs on 10/04/14 when you have INR checked  Testing/Procedures: Your physician has recommended that you have an ablation. Catheter ablation is a medical procedure used to treat some cardiac arrhythmias (irregular heartbeats). During catheter ablation, a long, thin, flexible tube is put into a blood vessel in your groin (upper thigh), or neck. This tube is called an ablation catheter. It is then guided to your heart through the blood vessel. Radio frequency waves destroy small areas of heart tissue where abnormal heartbeats may cause an arrhythmia to start. Please see the instruction sheet given to you today.    Follow-Up: Your physician recommends that you schedule a follow-up appointment in: 4 weeks from 10/15/14 post procedure with Rudi Coco, NP     Any Other Special Instructions Will Be Listed Below (If Applicable).  Cardiac Ablation Cardiac ablation is a procedure to disable a small amount of heart tissue in very specific places. The heart has many electrical connections. Sometimes these connections are abnormal and can cause the heart to beat very fast or irregularly. By disabling some of the problem areas, heart rhythm can be improved or made normal. Ablation is done for people who:   Have Wolff-Parkinson-White syndrome.   Have other fast heart rhythms (tachycardia).   Have taken medicines for an abnormal heart rhythm (arrhythmia) that resulted in:   No success.   Side effects.   May have a high-risk heartbeat that could result in death.  LET [facility] CARE PROVIDER KNOW ABOUT:   Any allergies you have or any previous reactions you have had to X-ray dye, food (such as seafood), medicine, or tape.   All medicines you  are taking, including vitamins, herbs, eye drops, creams, and over-the-counter medicines.   Previous problems you or members of your family have had with the use of anesthetics.   Any blood disorders you have.   Previous surgeries or procedures (such as a kidney transplant) you have had.   Medical conditions you have (such as kidney failure).  RISKS AND COMPLICATIONS Generally, cardiac ablation is a safe procedure. However, problems can occur and include:   Increased risk of cancer. Depending on how long it takes to do the ablation, the dose of radiation can be high.  Bruising and bleeding where a thin, flexible tube (catheter) was inserted during the procedure.   Bleeding into the chest, especially into the sac that surrounds the heart (serious).  Need for a permanent pacemaker if the normal electrical system is damaged.   The procedure may not be fully effective, and this may not be recognized for months. Repeat ablation procedures are sometimes required. BEFORE THE PROCEDURE   Follow any instructions from your health care provider regarding eating and drinking before the procedure.   Take your medicines as directed at regular times with water, unless instructed otherwise by your health care provider. If you are taking diabetes medicine, including insulin, ask how you are to take it and if there are any special instructions you should follow. It is common to adjust insulin dosing the day of the ablation.  PROCEDURE  An ablation is usually performed in a catheterization laboratory with the guidance of fluoroscopy. Fluoroscopy is a type of X-ray that helps your health care provider see images of your heart  during the procedure.   An ablation is a minimally invasive procedure. This means a small cut (incision) is made in either your neck or groin. Your health care provider will decide where to make the incision based on your medical history and physical exam.  An IV tube  will be started before the procedure begins. You will be given an anesthetic or medicine to help you relax (sedative).  The skin on your neck or groin will be numbed. A needle will be inserted into a large vein in your neck or groin and catheters will be threaded to your heart.  A special dye that shows up on fluoroscopy pictures may be injected through the catheter. The dye helps your health care provider see the area of the heart that needs treatment.  The catheter has electrodes on the tip. When the area of heart tissue that is causing the arrhythmia is found, the catheter tip will send an electrical current to the area and "scar" the tissue. Three types of energy can be used to ablate the heart tissue:   Heat (radiofrequency energy).   Laser energy.   Extreme cold (cryoablation).   When the area of the heart has been ablated, the catheter will be taken out. Pressure will be held on the insertion site. This will help the insertion site clot and keep it from bleeding. A bandage will be placed on the insertion site.  AFTER THE PROCEDURE   After the procedure, you will be taken to a recovery area where your vital signs (blood pressure, heart rate, and breathing) will be monitored. The insertion site will also be monitored for bleeding.   You will need to lie still for 4-6 hours. This is to ensure you do not bleed from the catheter insertion site.  Document Released: 08/26/2008 Document Revised: 08/24/2013 Document Reviewed: 09/01/2012 [facility] Patient Information 2015 [place], [place]. This information is not intended to replace advice given to you by your health care provider. Make sure you discuss any questions you have with your health care provider.

## 2014-09-15 NOTE — Progress Notes (Signed)
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  She maintains an active lifestyle.   She is appropriately anticoagulated with coumadin. She continues to have increasing frequency and duration of atrial fibrillation.  She reports symptoms of fatigue.  She also has post termination pauses and symptoms of dizziness.  She denies frank syncope.  She remains on tikosyn. Today, she denies symptoms of chest pain, shortness of breath, orthopnea, PND, lower extremity edema,  or neurologic sequela. The patient is tolerating medications without difficulties and is otherwise without complaint today.   Past Medical History  Diagnosis Date  . DIABETES MELLITUS 09/24/2008  . HYPERLIPIDEMIA 09/24/2008  . HYPERTENSION 09/24/2008  . MI 09/24/2008  . CAD 09/24/2008    [facility]I in 1998 tx with PCI to LAD;  myoview 1/12:  no ischemia, EF 45%  . CARDIOMYOPATHY 09/24/2008    ischemic;  echo 4/12:  EF 20-25%, mild MR, mod LAE, mod reduced RVSF, mod RVE, mild RAE, mild TR, PASP 40  . Persistent atrial fibrillation 09/24/2008    s/p DCCV x 4 in past;  Amiod d/c'd 2/2 abnormal PFTs;  Tikosyn load 6/12 with DCCV  . Edema 09/24/2008  . CAROTID BRUIT 09/24/2008  . Anemia   . GERD (gastroesophageal reflux disease)   . Osteopenia   . Tachycardia-bradycardia    Past Surgical History  Procedure Laterality Date  . Fracture surgery  2006    hip  . Knee surgery  2008    broken knee  . Loop recorder implant  11-06-2012    Medtronic LinQ implanted by Dr Johney Frame for palpitaitons and dizziness  . Loop recorder implant N/A 11/06/2012    Procedure: LOOP RECORDER IMPLANT;  Surgeon: Hillis Range, MD;  Location: [facility] CATH LAB;  Service: Cardiovascular;  Laterality: N/A;    Current Outpatient Prescriptions  Medication Sig Dispense Refill  . aspirin 81 MG EC tablet Take 81 mg by mouth daily.      Marland Kitchen  atorvastatin (LIPITOR) 10 MG tablet Take 0.5 tablets (5 mg total) by mouth daily. 45 tablet 1  . Calcium Carbonate-Vitamin D (CALCIUM 500 + D) 500-125 MG-UNIT TABS Take 1 tablet by mouth daily.     Marland Kitchen dofetilide (TIKOSYN) 250 MCG capsule Take 1 capsule (250 mcg total) by mouth 2 (two) times daily. 180 capsule 1  . furosemide (LASIX) 40 MG tablet Take 1 tablet (40 mg total) by mouth daily as needed for fluid. 90 tablet 1  . metoprolol (LOPRESSOR) 100 MG tablet Take 1 tablet (100 mg total) by mouth 2 (two) times daily. 180 tablet 1  . Multiple Vitamin (MULTIVITAMIN PO) Take 1 tablet by mouth daily.      . nitroGLYCERIN (NITROSTAT) 0.4 MG SL tablet Place 1 tablet (0.4 mg total) under the tongue every 5 (five) minutes as needed. (Patient taking differently: Place 0.4 mg under the tongue every 5 (five) minutes as needed for chest pain (MAX 3 TABLETS). ) 25 tablet 12  . potassium chloride (MICRO-K) 10 MEQ CR capsule Take 1 capsule (10 mEq total) by mouth daily as needed (for potassium repacement when taking lasix). 90 capsule 1  . ramipril (ALTACE) 10 MG capsule Take 2 capsules (20 mg total) by mouth daily. 180 capsule 1  . vitamin E 100 UNIT capsule Take 100 Units by mouth daily.      Marland Kitchen warfarin (COUMADIN)  2.5 MG tablet Take as directed by coumadin clinic 105 tablet 1   No current facility-administered medications for this visit.    Allergies  Allergen Reactions  . Simvastatin Rash    History   Social History  . Marital Status: Widowed    Spouse Name: N/A  . Number of Children: N/A  . Years of Education: N/A   Occupational History  . Not on file.   Social History Main Topics  . Smoking status: Former Smoker -- 0.50 packs/day for 20 years    Types: Cigarettes    Quit date: 07/11/1996  . Smokeless tobacco: Not on file  . Alcohol Use: No  . Drug Use: No  . Sexual Activity: Not on file   Other Topics Concern  . Not on file   Social History Narrative   ROS- all systems are reviewed  and negative except as per HPI today  Family History  Problem Relation Age of Onset  . Cancer Mother     breast  . Heart disease Mother   . Diabetes Mother   . Alcohol abuse Father   . Cancer Sister     breast  . Brain cancer Sister     cause of death at 51 years old  . Alcohol abuse Brother   . Heart disease Brother   . Coronary artery disease    . Hypertension    . Heart disease Brother   . Cancer Brother     unknown type   Physical Exam: Filed Vitals:   07/19/14 1117  BP: 132/76  Pulse: 55  Height: 5\' 5"  (1.651 m)  Weight: 202 lb (91.627 kg)    GEN- The patient is elderly appearing, alert and oriented x 3 today.   Head- normocephalic, atraumatic Eyes-  Sclera clear, conjunctiva pink Ears- hearing intact Oropharynx- clear Neck- supple, no JVP Lymph- no cervical lymphadenopathy Lungs- Clear to ausculation bilaterally, normal work of breathing Heart- irregular rate and rhythm, no murmurs, rubs or gallops,  GI- soft, NT, ND, + BS Extremities- no clubbing, cyanosis, trace pedal edema  LINQ interrogation is reviewed, afib burden 43.9% ekg today reveals afib, V rate 95 bpm, LVH Echo 2014 reveals biventricular failure (EF 20%), mild MR, LA 40 mm  Assessment and Plan:  1. Dizziness, presyncope  LINQ reviewed and reveals post termination pauses of up to 5 seconds. Options including pacing or ablation (See below)  2. Persistent afib Chads2vasc score is at least 7.  Continue coumadin afib burden by LINQ- presently 56.8% She has likely rate related cardiomyopathy, post termination pauses, and symptomatic AF.  She has failed medical therapy with tikosyn.  She has chronic restrictive lung disease and therefore amiodarone is really not a good option.  I do not have any good rhythm controlling medicines but did discuss GENETIC AF study.   Therapeutic strategies for afib including BIV pacemaker + AV nodal ablation vs pulmonary vein isolation ablation were discussed in detail  with the patient today. Risk, benefits, and alternatives to EP study and radiofrequency ablation for afib were also discussed in detail today. These risks include but are not limited to stroke, bleeding, vascular damage, tamponade, perforation, damage to the esophagus, lungs, and other structures, pulmonary vein stenosis, worsening renal function, and death. The patient understands these risk and wishes to proceed.  We will therefore proceed with catheter ablation at the next available time.  3. Hypertensive cardiovascular disease Stable No change required today  4. Chronic systolic dysfunction euvolemic today Could consider CRT-P  and AV nodal ablation if she fails catheter ablation for her afib Continue medical therapy  5. Restrictive lung disease Avoid amiodarone in the future

## 2014-09-17 ENCOUNTER — Ambulatory Visit (INDEPENDENT_AMBULATORY_CARE_PROVIDER_SITE_OTHER): Payer: PPO | Admitting: *Deleted

## 2014-09-17 DIAGNOSIS — R55 Syncope and collapse: Secondary | ICD-10-CM | POA: Diagnosis not present

## 2014-09-24 ENCOUNTER — Ambulatory Visit (INDEPENDENT_AMBULATORY_CARE_PROVIDER_SITE_OTHER): Payer: PPO | Admitting: *Deleted

## 2014-09-24 DIAGNOSIS — I4891 Unspecified atrial fibrillation: Secondary | ICD-10-CM

## 2014-09-24 DIAGNOSIS — Z5181 Encounter for therapeutic drug level monitoring: Secondary | ICD-10-CM | POA: Diagnosis not present

## 2014-09-24 DIAGNOSIS — Z7901 Long term (current) use of anticoagulants: Secondary | ICD-10-CM | POA: Diagnosis not present

## 2014-09-24 DIAGNOSIS — I480 Paroxysmal atrial fibrillation: Secondary | ICD-10-CM | POA: Diagnosis not present

## 2014-09-24 LAB — POCT INR: INR: 2.5

## 2014-09-28 ENCOUNTER — Ambulatory Visit (INDEPENDENT_AMBULATORY_CARE_PROVIDER_SITE_OTHER): Payer: PPO | Admitting: *Deleted

## 2014-09-28 DIAGNOSIS — I4891 Unspecified atrial fibrillation: Secondary | ICD-10-CM | POA: Diagnosis not present

## 2014-09-28 DIAGNOSIS — Z5181 Encounter for therapeutic drug level monitoring: Secondary | ICD-10-CM | POA: Diagnosis not present

## 2014-09-28 DIAGNOSIS — Z7901 Long term (current) use of anticoagulants: Secondary | ICD-10-CM

## 2014-09-28 DIAGNOSIS — I480 Paroxysmal atrial fibrillation: Secondary | ICD-10-CM

## 2014-09-28 LAB — CUP PACEART REMOTE DEVICE CHECK: Date Time Interrogation Session: 20160607101010

## 2014-09-28 LAB — POCT INR: INR: 1.8

## 2014-09-28 NOTE — Progress Notes (Signed)
Loop recorder 

## 2014-10-04 ENCOUNTER — Ambulatory Visit (INDEPENDENT_AMBULATORY_CARE_PROVIDER_SITE_OTHER): Payer: PPO | Admitting: Pharmacist

## 2014-10-04 ENCOUNTER — Other Ambulatory Visit (INDEPENDENT_AMBULATORY_CARE_PROVIDER_SITE_OTHER): Payer: PPO | Admitting: *Deleted

## 2014-10-04 DIAGNOSIS — I480 Paroxysmal atrial fibrillation: Secondary | ICD-10-CM

## 2014-10-04 DIAGNOSIS — I4891 Unspecified atrial fibrillation: Secondary | ICD-10-CM | POA: Diagnosis not present

## 2014-10-04 DIAGNOSIS — Z5181 Encounter for therapeutic drug level monitoring: Secondary | ICD-10-CM | POA: Diagnosis not present

## 2014-10-04 DIAGNOSIS — Z7901 Long term (current) use of anticoagulants: Secondary | ICD-10-CM | POA: Diagnosis not present

## 2014-10-04 LAB — CBC WITH DIFFERENTIAL/PLATELET
Basophils Absolute: 0 10*3/uL (ref 0.0–0.1)
Basophils Relative: 0.5 % (ref 0.0–3.0)
EOS PCT: 1.4 % (ref 0.0–5.0)
Eosinophils Absolute: 0.1 10*3/uL (ref 0.0–0.7)
HEMATOCRIT: 43 % (ref 36.0–46.0)
HEMOGLOBIN: 14.2 g/dL (ref 12.0–15.0)
LYMPHS ABS: 2.6 10*3/uL (ref 0.7–4.0)
Lymphocytes Relative: 29.4 % (ref 12.0–46.0)
MCHC: 33.1 g/dL (ref 30.0–36.0)
MCV: 92.5 fl (ref 78.0–100.0)
MONOS PCT: 10 % (ref 3.0–12.0)
Monocytes Absolute: 0.9 10*3/uL (ref 0.1–1.0)
Neutro Abs: 5.2 10*3/uL (ref 1.4–7.7)
Neutrophils Relative %: 58.7 % (ref 43.0–77.0)
PLATELETS: 298 10*3/uL (ref 150.0–400.0)
RBC: 4.64 Mil/uL (ref 3.87–5.11)
RDW: 14.9 % (ref 11.5–15.5)
WBC: 8.8 10*3/uL (ref 4.0–10.5)

## 2014-10-04 LAB — POCT INR: INR: 2.9

## 2014-10-04 LAB — BASIC METABOLIC PANEL
BUN: 11 mg/dL (ref 6–23)
CALCIUM: 9.5 mg/dL (ref 8.4–10.5)
CHLORIDE: 103 meq/L (ref 96–112)
CO2: 31 meq/L (ref 19–32)
CREATININE: 0.7 mg/dL (ref 0.40–1.20)
GFR: 84.93 mL/min (ref >60.00)
Glucose, Bld: 91 mg/dL (ref 70–99)
Potassium: 4.4 mEq/L (ref 3.5–5.1)
Sodium: 138 mEq/L (ref 135–145)

## 2014-10-06 ENCOUNTER — Encounter: Payer: Self-pay | Admitting: Cardiology

## 2014-10-11 ENCOUNTER — Other Ambulatory Visit: Payer: Self-pay

## 2014-10-11 ENCOUNTER — Ambulatory Visit (INDEPENDENT_AMBULATORY_CARE_PROVIDER_SITE_OTHER): Payer: PPO | Admitting: *Deleted

## 2014-10-11 DIAGNOSIS — I4891 Unspecified atrial fibrillation: Secondary | ICD-10-CM | POA: Diagnosis not present

## 2014-10-11 DIAGNOSIS — Z7901 Long term (current) use of anticoagulants: Secondary | ICD-10-CM

## 2014-10-11 DIAGNOSIS — I480 Paroxysmal atrial fibrillation: Secondary | ICD-10-CM

## 2014-10-11 DIAGNOSIS — Z5181 Encounter for therapeutic drug level monitoring: Secondary | ICD-10-CM

## 2014-10-11 LAB — POCT INR: INR: 3.7

## 2014-10-11 MED ORDER — RAMIPRIL 10 MG PO CAPS: 20.0000 mg | ORAL_CAPSULE | Freq: Every day | ORAL | Status: DC

## 2014-10-11 MED ORDER — WARFARIN SODIUM 2.5 MG PO TABS: ORAL_TABLET | ORAL | Status: DC

## 2014-10-11 MED ORDER — ATORVASTATIN CALCIUM 10 MG PO TABS: 5.0000 mg | ORAL_TABLET | Freq: Every day | ORAL | Status: DC

## 2014-10-12 ENCOUNTER — Other Ambulatory Visit: Payer: Self-pay | Admitting: Internal Medicine

## 2014-10-12 ENCOUNTER — Encounter: Payer: Self-pay | Admitting: Internal Medicine

## 2014-10-14 ENCOUNTER — Ambulatory Visit (INDEPENDENT_AMBULATORY_CARE_PROVIDER_SITE_OTHER): Payer: PPO

## 2014-10-14 DIAGNOSIS — Z5181 Encounter for therapeutic drug level monitoring: Secondary | ICD-10-CM

## 2014-10-14 DIAGNOSIS — I4891 Unspecified atrial fibrillation: Secondary | ICD-10-CM

## 2014-10-14 DIAGNOSIS — I480 Paroxysmal atrial fibrillation: Secondary | ICD-10-CM | POA: Diagnosis not present

## 2014-10-14 DIAGNOSIS — Z7901 Long term (current) use of anticoagulants: Secondary | ICD-10-CM | POA: Diagnosis not present

## 2014-10-14 LAB — POCT INR: INR: 2.3

## 2014-10-15 ENCOUNTER — Encounter (HOSPITAL_COMMUNITY): Payer: Self-pay

## 2014-10-15 ENCOUNTER — Ambulatory Visit (HOSPITAL_COMMUNITY): Payer: PPO | Admitting: Certified Registered"

## 2014-10-15 ENCOUNTER — Ambulatory Visit (HOSPITAL_COMMUNITY)
Admission: RE | Admit: 2014-10-15 | Discharge: 2014-10-16 | Disposition: A | Discharge status: Home / Self Care | Payer: PPO | Source: Ambulatory Visit | Attending: Cardiovascular Disease | Admitting: Cardiovascular Disease

## 2014-10-15 ENCOUNTER — Encounter (HOSPITAL_COMMUNITY)
Admission: RE | Disposition: A | Discharge status: Home / Self Care | Payer: Self-pay | Source: Ambulatory Visit | Attending: Cardiovascular Disease

## 2014-10-15 ENCOUNTER — Ambulatory Visit (HOSPITAL_BASED_OUTPATIENT_CLINIC_OR_DEPARTMENT_OTHER)
Admission: RE | Admit: 2014-10-15 | Discharge: 2014-10-15 | Disposition: A | Discharge status: Home / Self Care | Payer: PPO | Source: Ambulatory Visit | Attending: Nurse Practitioner | Admitting: Nurse Practitioner

## 2014-10-15 DIAGNOSIS — E785 Hyperlipidemia, unspecified: Secondary | ICD-10-CM | POA: Diagnosis not present

## 2014-10-15 DIAGNOSIS — Z7982 Long term (current) use of aspirin: Secondary | ICD-10-CM | POA: Diagnosis not present

## 2014-10-15 DIAGNOSIS — I340 Nonrheumatic mitral (valve) insufficiency: Secondary | ICD-10-CM | POA: Diagnosis not present

## 2014-10-15 DIAGNOSIS — I2510 Atherosclerotic heart disease of native coronary artery without angina pectoris: Secondary | ICD-10-CM | POA: Insufficient documentation

## 2014-10-15 DIAGNOSIS — I484 Atypical atrial flutter: Secondary | ICD-10-CM | POA: Insufficient documentation

## 2014-10-15 DIAGNOSIS — J984 Other disorders of lung: Secondary | ICD-10-CM | POA: Diagnosis not present

## 2014-10-15 DIAGNOSIS — I4891 Unspecified atrial fibrillation: Secondary | ICD-10-CM | POA: Diagnosis present

## 2014-10-15 DIAGNOSIS — Z87891 Personal history of nicotine dependence: Secondary | ICD-10-CM | POA: Insufficient documentation

## 2014-10-15 DIAGNOSIS — I481 Persistent atrial fibrillation: Secondary | ICD-10-CM | POA: Diagnosis not present

## 2014-10-15 DIAGNOSIS — I5022 Chronic systolic (congestive) heart failure: Secondary | ICD-10-CM | POA: Diagnosis not present

## 2014-10-15 DIAGNOSIS — I252 Old myocardial infarction: Secondary | ICD-10-CM | POA: Insufficient documentation

## 2014-10-15 DIAGNOSIS — I119 Hypertensive heart disease without heart failure: Secondary | ICD-10-CM | POA: Diagnosis not present

## 2014-10-15 DIAGNOSIS — I4892 Unspecified atrial flutter: Secondary | ICD-10-CM | POA: Diagnosis not present

## 2014-10-15 DIAGNOSIS — E119 Type 2 diabetes mellitus without complications: Secondary | ICD-10-CM | POA: Insufficient documentation

## 2014-10-15 DIAGNOSIS — I480 Paroxysmal atrial fibrillation: Secondary | ICD-10-CM | POA: Diagnosis not present

## 2014-10-15 HISTORY — PX: TEE WITHOUT CARDIOVERSION: SHX5443

## 2014-10-15 HISTORY — PX: ELECTROPHYSIOLOGIC STUDY: SHX172A

## 2014-10-15 LAB — PROTIME-INR
INR: 2.09 — ABNORMAL HIGH (ref 0.00–1.49)
PROTHROMBIN TIME: 23.4 s — AB (ref 11.6–15.2)

## 2014-10-15 LAB — POCT ACTIVATED CLOTTING TIME
ACTIVATED CLOTTING TIME: 313 s
ACTIVATED CLOTTING TIME: 147 s
Activated Clotting Time: 331 seconds
Activated Clotting Time: 319 seconds

## 2014-10-15 LAB — MRSA PCR SCREENING: MRSA by PCR: NEGATIVE

## 2014-10-15 SURGERY — ATRIAL FIBRILLATION ABLATION
Anesthesia: Monitor Anesthesia Care

## 2014-10-15 SURGERY — ECHOCARDIOGRAM, TRANSESOPHAGEAL
Anesthesia: Moderate Sedation

## 2014-10-15 MED ORDER — BUPIVACAINE HCL (PF) 0.25 % IJ SOLN
INTRAMUSCULAR | Status: DC | PRN
  Administered 2014-10-15: 10 mL

## 2014-10-15 MED ORDER — METOPROLOL TARTRATE 50 MG PO TABS
50.0000 mg | ORAL_TABLET | Freq: Two times a day (BID) | ORAL | Status: DC
  Administered 2014-10-15 – 2014-10-16 (×2): 50 mg via ORAL
  Filled 2014-10-15 (×3): qty 1

## 2014-10-15 MED ORDER — SODIUM CHLORIDE 0.9 % IV SOLN
INTRAVENOUS | Status: DC
Start: 2014-10-15 — End: 2014-10-15

## 2014-10-15 MED ORDER — HYDROCODONE-ACETAMINOPHEN 5-325 MG PO TABS: 1.0000 | ORAL_TABLET | ORAL | Status: DC | PRN

## 2014-10-15 MED ORDER — SODIUM CHLORIDE 0.9 % IV SOLN
INTRAVENOUS | Status: DC | PRN
  Administered 2014-10-15: 12:00:00 via INTRAVENOUS

## 2014-10-15 MED ORDER — WARFARIN 1.25 MG HALF TABLET: 1.2500 mg | ORAL_TABLET | ORAL | Status: DC

## 2014-10-15 MED ORDER — MIDAZOLAM HCL 10 MG/2ML IJ SOLN
INTRAMUSCULAR | Status: DC | PRN
  Administered 2014-10-15: 2 mg via INTRAVENOUS

## 2014-10-15 MED ORDER — PROPOFOL INFUSION 10 MG/ML OPTIME
INTRAVENOUS | Status: DC | PRN
  Administered 2014-10-15: 50 ug/kg/min via INTRAVENOUS

## 2014-10-15 MED ORDER — SODIUM CHLORIDE 0.9 % IJ SOLN
3.0000 mL | Freq: Two times a day (BID) | INTRAMUSCULAR | Status: DC
  Administered 2014-10-15: 3 mL via INTRAVENOUS
  Administered 2014-10-16: 10 mL via INTRAVENOUS

## 2014-10-15 MED ORDER — HEPARIN SODIUM (PORCINE) 1000 UNIT/ML IJ SOLN
INTRAMUSCULAR | Status: AC
  Filled 2014-10-15: qty 1

## 2014-10-15 MED ORDER — MIDAZOLAM HCL 5 MG/ML IJ SOLN
INTRAMUSCULAR | Status: AC
  Filled 2014-10-15: qty 2

## 2014-10-15 MED ORDER — ONDANSETRON HCL 4 MG/2ML IJ SOLN: 4.0000 mg | Freq: Four times a day (QID) | INTRAMUSCULAR | Status: DC | PRN

## 2014-10-15 MED ORDER — FENTANYL CITRATE (PF) 100 MCG/2ML IJ SOLN
INTRAMUSCULAR | Status: AC
  Filled 2014-10-15: qty 2

## 2014-10-15 MED ORDER — DIPHENHYDRAMINE HCL 50 MG/ML IJ SOLN
INTRAMUSCULAR | Status: DC | PRN
  Administered 2014-10-15: 25 mg via INTRAVENOUS

## 2014-10-15 MED ORDER — IOHEXOL 350 MG/ML SOLN
INTRAVENOUS | Status: DC | PRN
  Administered 2014-10-15: 100 mL via INTRAVENOUS
  Administered 2014-10-15: 1 mL via INTRACARDIAC

## 2014-10-15 MED ORDER — DIPHENHYDRAMINE HCL 50 MG/ML IJ SOLN
INTRAMUSCULAR | Status: AC
  Filled 2014-10-15: qty 1

## 2014-10-15 MED ORDER — HEPARIN SODIUM (PORCINE) 1000 UNIT/ML IJ SOLN
INTRAMUSCULAR | Status: DC | PRN
  Administered 2014-10-15: 10000 [IU] via INTRAVENOUS
  Administered 2014-10-15: 1000 [IU] via INTRAVENOUS

## 2014-10-15 MED ORDER — BUPIVACAINE HCL (PF) 0.25 % IJ SOLN
INTRAMUSCULAR | Status: AC
  Filled 2014-10-15: qty 30

## 2014-10-15 MED ORDER — FENTANYL CITRATE (PF) 100 MCG/2ML IJ SOLN
INTRAMUSCULAR | Status: DC | PRN
  Administered 2014-10-15: 25 ug via INTRAVENOUS
  Administered 2014-10-15: 50 ug via INTRAVENOUS
  Administered 2014-10-15: 25 ug via INTRAVENOUS

## 2014-10-15 MED ORDER — WARFARIN - PHARMACIST DOSING INPATIENT: Freq: Every day | Status: DC

## 2014-10-15 MED ORDER — SODIUM CHLORIDE 0.9 % IV SOLN: INTRAVENOUS | Status: DC

## 2014-10-15 MED ORDER — DOFETILIDE 250 MCG PO CAPS
250.0000 ug | ORAL_CAPSULE | Freq: Two times a day (BID) | ORAL | Status: DC
  Administered 2014-10-15 – 2014-10-16 (×2): 250 ug via ORAL
  Filled 2014-10-15 (×3): qty 1

## 2014-10-15 MED ORDER — DOBUTAMINE IN D5W 4-5 MG/ML-% IV SOLN
INTRAVENOUS | Status: AC
  Filled 2014-10-15: qty 250

## 2014-10-15 MED ORDER — WARFARIN SODIUM 2.5 MG PO TABS
2.5000 mg | ORAL_TABLET | ORAL | Status: DC
  Administered 2014-10-15: 2.5 mg via ORAL
  Filled 2014-10-15 (×2): qty 1

## 2014-10-15 MED ORDER — FENTANYL CITRATE (PF) 100 MCG/2ML IJ SOLN
25.0000 ug | Freq: Once | INTRAMUSCULAR | Status: AC
Start: 2014-10-15 — End: 2014-10-15
  Administered 2014-10-15 (×4): 25 ug via INTRAVENOUS

## 2014-10-15 MED ORDER — SODIUM CHLORIDE 0.9 % IV SOLN: 250.0000 mL | INTRAVENOUS | Status: DC | PRN

## 2014-10-15 MED ORDER — ACETAMINOPHEN 325 MG PO TABS: 650.0000 mg | ORAL_TABLET | ORAL | Status: DC | PRN

## 2014-10-15 MED ORDER — SODIUM CHLORIDE 0.9 % IJ SOLN: 3.0000 mL | INTRAMUSCULAR | Status: DC | PRN

## 2014-10-15 MED ORDER — FENTANYL CITRATE (PF) 100 MCG/2ML IJ SOLN
INTRAMUSCULAR | Status: DC | PRN
  Administered 2014-10-15: 50 ug via INTRAVENOUS

## 2014-10-15 MED ORDER — PROTAMINE SULFATE 10 MG/ML IV SOLN
INTRAVENOUS | Status: DC | PRN
  Administered 2014-10-15: 10 mg via INTRAVENOUS
  Administered 2014-10-15: 20 mg via INTRAVENOUS

## 2014-10-15 SURGICAL SUPPLY — 22 items
BAG SNAP BAND KOVER 36X36 (MISCELLANEOUS) ×3 IMPLANT
BLANKET WARM UNDERBOD FULL ACC (MISCELLANEOUS) ×3 IMPLANT
CATH DIAG 6FR PIGTAIL (CATHETERS) ×3 IMPLANT
CATH NAVISTAR SMARTTOUCH DF (ABLATOR) ×3 IMPLANT
CATH SOUNDSTAR 3D IMAGING (CATHETERS) ×3 IMPLANT
CATH VARIABLE LASSO NAV 2515 (CATHETERS) ×2 IMPLANT
CATH WEBSTER BI DIR CS D-F CRV (CATHETERS) ×3 IMPLANT
COVER SWIFTLINK CONNECTOR (BAG) ×3 IMPLANT
NDL TRANSEP BRK 71CM 407200 (NEEDLE) ×1 IMPLANT
NEEDLE TRANSEP BRK 71CM 407200 (NEEDLE) ×3 IMPLANT
PACK EP LATEX FREE (CUSTOM PROCEDURE TRAY) ×3
PACK EP LF (CUSTOM PROCEDURE TRAY) ×1 IMPLANT
PAD DEFIB LIFELINK (PAD) ×3 IMPLANT
PATCH CARTO3 (PAD) ×3 IMPLANT
SHEATH AVANTI 11F 11CM (SHEATH) ×3 IMPLANT
SHEATH PINNACLE 7F 10CM (SHEATH) ×5 IMPLANT
SHEATH PINNACLE 9F 10CM (SHEATH) ×3 IMPLANT
SHEATH SWARTZ TS SL2 63CM 8.5F (SHEATH) ×3 IMPLANT
SHIELD RADPAD SCOOP 12X17 (MISCELLANEOUS) ×3 IMPLANT
SYR MEDRAD MARK V 150ML (SYRINGE) ×3 IMPLANT
TUBING CONTRAST HIGH PRESS 48 (TUBING) ×3 IMPLANT
TUBING SMART ABLATE COOLFLOW (TUBING) ×3 IMPLANT

## 2014-10-15 NOTE — Interval H&P Note (Signed)
History and Physical Interval Note:  10/15/2014 8:03 AM  [name]  has presented today for surgery, with the diagnosis of afib  The various methods of treatment have been discussed with the patient and family. After consideration of risks, benefits and other options for treatment, the patient has consented to  Procedure(s): Atrial Fibrillation Ablation (N/A) as a surgical intervention .  The patient's history has been reviewed, patient examined, no change in status, stable for surgery.  I have reviewed the patient's chart and labs.  Questions were answered to the patient's satisfaction.     Hillis Range

## 2014-10-15 NOTE — Progress Notes (Signed)
Site area: right groin a 11,9,7 french venous  Sheaths were removed  Site Prior to Removal:  Level 0  Pressure Applied For 20 MINUTES    Minutes Beginning at 1700  Manual:   Yes.    Patient Status During Pull:  stable  Post Pull Groin Site:  Level 0  Post Pull Instructions Given:  Yes.    Post Pull Pulses Present:  Yes.    Dressing Applied:  Yes.    Comments:  VS remain stable during sheath pull.  Pt given pain med for back pain on a scale of 10/10.  Pain at level 2 at this time.  No pain or discomfort at right groin site at this time.

## 2014-10-15 NOTE — Progress Notes (Signed)
  Echocardiogram Echocardiogram Transesophageal has been performed.  Delcie Roch 10/15/2014, 10:45 AM

## 2014-10-15 NOTE — Progress Notes (Signed)
ANTICOAGULATION CONSULT NOTE - Initial Consult  Pharmacy Consult for coumadin Indication: atrial fibrillation  Allergies  Allergen Reactions  . Simvastatin Rash    Patient Measurements: Height: 5\' 4"  (162.6 cm) Weight: 200 lb (90.719 kg) IBW/kg (Calculated) : 54.7 Heparin Dosing Weight:   Vital Signs: Temp: 97.5 F (36.4 C) (06/24 0938) Temp Source: Oral (06/24 0938) BP: 174/93 mmHg (06/24 1815) Pulse Rate: 57 (06/24 1815)  Labs:  Recent Labs  10/14/14 0948 10/15/14 0814  LABPROT  --  23.4*  INR 2.3 2.09*    Estimated Creatinine Clearance: 58.1 mL/min (by C-G formula based on Cr of 0.7).   Medical History: Past Medical History  Diagnosis Date  . DIABETES MELLITUS 09/24/2008  . HYPERLIPIDEMIA 09/24/2008  . HYPERTENSION 09/24/2008  . MI 09/24/2008  . CAD 09/24/2008    AMI in 1998 tx with PCI to LAD;  myoview 1/12:  no ischemia, EF 45%  . CARDIOMYOPATHY 09/24/2008    ischemic;  echo 4/12:  EF 20-25%, mild MR, mod LAE, mod reduced RVSF, mod RVE, mild RAE, mild TR, PASP 40  . Persistent atrial fibrillation 09/24/2008    s/p DCCV x 4 in past;  Amiod d/c'd 2/2 abnormal PFTs;  Tikosyn load 6/12 with DCCV  . Edema 09/24/2008  . CAROTID BRUIT 09/24/2008  . Anemia   . GERD (gastroesophageal reflux disease)   . Osteopenia   . Tachycardia-bradycardia     Medications:  Scheduled:  . dofetilide  250 mcg Oral BID  . metoprolol  50 mg Oral BID  . sodium chloride  3 mL Intravenous Q12H    Assessment: 81 yo who was admitted for an ablation. She has been on coumadin for afib. INR on admission was 2.09 today.   PTA = Coumadin 2.5mg  PO qday except 1.25mg  Tue  Goal of Therapy:  INR 2-3 Monitor platelets by anticoagulation protocol: Yes   Plan:   Resume coumadin 2.5mg  PO qday except 1.25mg  PO Tue Daily INR  Ulyses Southward, PharmD Pager: [phone] 10/15/2014 6:47 PM

## 2014-10-15 NOTE — Discharge Summary (Signed)
ELECTROPHYSIOLOGY PROCEDURE DISCHARGE SUMMARY    Patient ID: [name],  MRN: [id number], DOB/AGE: 79-Aug-1931 79 y.o.  Admit date: 10/15/2014 Discharge date: 10/16/2014  Primary Care Physician: [name], MD Primary Cardiologist: Eden Emms Electrophysiologist: Hillis Range, MD  Primary Discharge Diagnosis:  1.  Persistent atrial fibrillation and atrial flutter status post ablation this admission  Secondary Discharge Diagnosis:  1.  Hypertension 2.  Hyperlipidemia 3.  Diabetes 4.  CAD 5.  Ischemic cardiomyopathy 6.  Tachy/brady syndrome  Procedures This Admission:  1.  Electrophysiology study and radiofrequency catheter ablation on 10/15/14 by Dr Hillis Range.  This study demonstrated sinus rhythm upon presentation; rotational Angiography reveals a moderate sized left atrium with four separate pulmonary veins without evidence of pulmonary vein stenosis; successful electrical isolation and anatomical encircling of all four pulmonary veins with radiofrequency current; cavo-tricuspid isthmus ablation was performed with complete bidirectional isthmus block achieved; multiple atypical atrial flutter circuits were demonstrated but were not suitable for mapping/ ablation today; no early apparent complications.    Brief HPI: [name] is a 81 y.o. female  with a history of persistent atrial fibrillation.  They have failed medical therapy with Tikosyn. Risks, benefits, and alternatives to catheter ablation of atrial fibrillation were reviewed with the patient who wished to proceed.  The patient underwent TEE prior to the procedure which demonstrated chronically depressed LV function and no LAA thrombus.    Hospital Course:  The patient was admitted and underwent EPS/RFCA of atrial fibrillation with details as outlined above.  They were monitored on telemetry overnight which demonstrated sinus rhythm.  Groin was without complication on the day of discharge.  The patient was examined  and considered to be stable for discharge.  Wound care and restrictions were reviewed with the patient.  The patient will be seen back by Rudi Coco, NP in 4 weeks and Dr Johney Frame in 12 weeks for post ablation follow up.   This patients CHA2DS2-VASc Score and unadjusted Ischemic Stroke Rate (% per year) is equal to 11.2 % stroke rate/year from a score of 7 Above score calculated as 1 point each if present [CHF, HTN, DM, Vascular=MI/PAD/Aortic Plaque, Age if 51-74, or Female] Above score calculated as 2 points each if present [Age > 75, or Stroke/TIA/TE]   Physical Exam: Filed Vitals:   10/16/14 0000 10/16/14 0200 10/16/14 0400 10/16/14 0800  BP: 109/56 109/54 117/47 147/62  Pulse: 57 58 58 61  Temp:   98.8 F (37.1 C) 98.2 F (36.8 C)  TempSrc:   Oral Oral  Resp: 10 10 12 15   Height:      Weight:      SpO2: 98% 97% 98% 98%    GEN- The patient is well appearing, alert and oriented x 3 today.   HEENT: normocephalic, atraumatic; sclera clear, conjunctiva pink; hearing intact; oropharynx clear; neck supple, no JVP Lymph- no cervical lymphadenopathy Lungs- Clear to ausculation bilaterally, normal work of breathing.  No wheezes, rales, rhonchi Heart- Regular rate and rhythm, no murmurs, rubs or gallops, PMI not laterally displaced GI- soft, non-tender, non-distended, bowel sounds present, no hepatosplenomegaly Extremities- no clubbing, cyanosis, or edema; DP/PT/radial pulses 2+ bilaterally, groin without hematoma/bruit MS- no significant deformity or atrophy Skin- warm and dry, no rash or lesion Psych- euthymic mood, full affect Neuro- strength and sensation are intact   Labs:   Lab Results  Component Value Date   WBC 8.8 10/04/2014   HGB 14.2 10/04/2014   HCT 43.0 10/04/2014  MCV 92.5 10/04/2014   PLT 298.0 10/04/2014     Recent Labs Lab 10/16/14 0232  NA 139  K 4.3  CL 107  CO2 26  BUN 11  CREATININE 0.71  CALCIUM 7.9*  GLUCOSE 114*     Discharge Medications:     Medication List    TAKE these medications        aspirin 81 MG EC tablet  Take 81 mg by mouth daily.     atorvastatin 10 MG tablet  Commonly known as:  LIPITOR  Take 0.5 tablets (5 mg total) by mouth daily.     CALCIUM 500 + D 500-125 MG-UNIT Tabs  Generic drug:  Calcium Carbonate-Vitamin D  Take 1 tablet by mouth daily.     dofetilide 250 MCG capsule  Commonly known as:  TIKOSYN  Take 1 capsule (250 mcg total) by mouth 2 (two) times daily.     furosemide 40 MG tablet  Commonly known as:  LASIX  Take 1 tablet (40 mg total) by mouth daily as needed for fluid.     metoprolol 100 MG tablet  Commonly known as:  LOPRESSOR  Take 0.5 tablets (50 mg total) by mouth 2 (two) times daily.     MULTIVITAMIN PO  Take 1 tablet by mouth daily.     nitroGLYCERIN 0.4 MG SL tablet  Commonly known as:  NITROSTAT  Place 1 tablet (0.4 mg total) under the tongue every 5 (five) minutes as needed.     pantoprazole 40 MG tablet  Commonly known as:  PROTONIX  Take 1 tablet (40 mg total) by mouth daily.     potassium chloride 10 MEQ CR capsule  Commonly known as:  MICRO-K  Take 1 capsule (10 mEq total) by mouth daily as needed (for potassium repacement when taking lasix).     ramipril 10 MG capsule  Commonly known as:  ALTACE  Take 2 capsules (20 mg total) by mouth daily.     vitamin E 100 UNIT capsule  Take 100 Units by mouth daily.     warfarin 2.5 MG tablet  Commonly known as:  COUMADIN  Take as directed by coumadin clinic        Disposition:   Follow-up Information    Follow up with [facility] On 10/22/2014.   Why:  at 9:15AM for coumadin check   Contact information:   [address] [place] [address]       Follow up with [facility] On 11/15/2014.   Why:  at 10:30AM   Contact information:   [address] [place] [phone]      Follow up with Hillis Range, MD On 01/17/2015.   Specialty:   Cardiology   Why:  at 9:30AM   Contact information:   [address] [place] [address] [phone]       Duration of Discharge Encounter: Greater than 30 minutes including physician time.  Signed, Hillis Range MD

## 2014-10-15 NOTE — CV Procedure (Signed)
    Transesophageal Echocardiogram Note  [name] [id number] 01-14-1930  Procedure: Transesophageal Echocardiogram Indications: atrial fib, pre- Afib ablation   Procedure Details Consent: Obtained Time Out: Verified patient identification, verified procedure, site/side was marked, verified correct patient position, special equipment/implants available, Radiology Safety Procedures followed,  medications/allergies/relevent history reviewed, required imaging and test results available.  Performed  Medications: Fentanyl: 50 mcg iv  Versed: 2 mg iv   Left Ventrical:  Markedly reduced LV function. EF 20-25%  Mitral Valve: moderate MR   Aortic Valve: mild thickening and calcification , no AS or AS   Tricuspid Valve: mild - moderate TR   Pulmonic Valve: trivial PI.  There is a 2nd jet of  PI  At the border of the aorta and PA  Left Atrium/ Left atrial appendage: no thrombi,  Extensive spontaneous contrast ( "smoke")   Atrial septum: intact by color flow   Aorta: mild - moderate calcification    Complications: No apparent complications Patient did tolerate procedure well.   Vesta Mixer, Montez Hageman., MD, [facility] 10/15/2014, 9:29 AM

## 2014-10-15 NOTE — H&P (View-Only) (Signed)
PCP: [name],BRUCE W, MD Primary Cardiologist:  Dr Nishan  [name] is a 81 y.o. female patient with a h/o persistent atrial fibrillation, CAD, and ischemic CM who presents today for EP follow-up.  She maintains an active lifestyle.   She is appropriately anticoagulated with coumadin. She continues to have increasing frequency and duration of atrial fibrillation.  She reports symptoms of fatigue.  She also has post termination pauses and symptoms of dizziness.  She denies frank syncope.  She remains on tikosyn. Today, she denies symptoms of chest pain, shortness of breath, orthopnea, PND, lower extremity edema,  or neurologic sequela. The patient is tolerating medications without difficulties and is otherwise without complaint today.   Past Medical History  Diagnosis Date  . DIABETES MELLITUS 09/24/2008  . HYPERLIPIDEMIA 09/24/2008  . HYPERTENSION 09/24/2008  . MI 09/24/2008  . CAD 09/24/2008    AMI in 1998 tx with PCI to LAD;  myoview 1/12:  no ischemia, EF 45%  . CARDIOMYOPATHY 09/24/2008    ischemic;  echo 4/12:  EF 20-25%, mild MR, mod LAE, mod reduced RVSF, mod RVE, mild RAE, mild TR, PASP 40  . Persistent atrial fibrillation 09/24/2008    s/p DCCV x 4 in past;  Amiod d/c'd 2/2 abnormal PFTs;  Tikosyn load 6/12 with DCCV  . Edema 09/24/2008  . CAROTID BRUIT 09/24/2008  . Anemia   . GERD (gastroesophageal reflux disease)   . Osteopenia   . Tachycardia-bradycardia    Past Surgical History  Procedure Laterality Date  . Fracture surgery  2006    hip  . Knee surgery  2008    broken knee  . Loop recorder implant  11-06-2012    Medtronic LinQ implanted by Dr Shamanda Len for palpitaitons and dizziness  . Loop recorder implant N/A 11/06/2012    Procedure: LOOP RECORDER IMPLANT;  Surgeon: Saleema Weppler, MD;  Location: [facility] CATH [facility];  Service: Cardiovascular;  Laterality: N/A;    Current Outpatient Prescriptions  Medication Sig Dispense Refill  . aspirin 81 MG EC tablet Take 81 mg by mouth daily.      .  atorvastatin (LIPITOR) 10 MG tablet Take 0.5 tablets (5 mg total) by mouth daily. 45 tablet 1  . Calcium Carbonate-Vitamin D (CALCIUM 500 + D) 500-125 MG-UNIT TABS Take 1 tablet by mouth daily.     . dofetilide (TIKOSYN) 250 MCG capsule Take 1 capsule (250 mcg total) by mouth 2 (two) times daily. 180 capsule 1  . furosemide (LASIX) 40 MG tablet Take 1 tablet (40 mg total) by mouth daily as needed for fluid. 90 tablet 1  . metoprolol (LOPRESSOR) 100 MG tablet Take 1 tablet (100 mg total) by mouth 2 (two) times daily. 180 tablet 1  . Multiple Vitamin (MULTIVITAMIN PO) Take 1 tablet by mouth daily.      . nitroGLYCERIN (NITROSTAT) 0.4 MG SL tablet Place 1 tablet (0.4 mg total) under the tongue every 5 (five) minutes as needed. (Patient taking differently: Place 0.4 mg under the tongue every 5 (five) minutes as needed for chest pain (MAX 3 TABLETS). ) 25 tablet 12  . potassium chloride (MICRO-K) 10 MEQ CR capsule Take 1 capsule (10 mEq total) by mouth daily as needed (for potassium repacement when taking lasix). 90 capsule 1  . ramipril (ALTACE) 10 MG capsule Take 2 capsules (20 mg total) by mouth daily. 180 capsule 1  . vitamin E 100 UNIT capsule Take 100 Units by mouth daily.      . warfarin (COUMADIN)   2.5 MG tablet Take as directed by coumadin clinic 105 tablet 1   No current facility-administered medications for this visit.    Allergies  Allergen Reactions  . Simvastatin Rash    History   Social History  . Marital Status: Widowed    Spouse Name: N/A  . Number of Children: N/A  . Years of Education: N/A   Occupational History  . Not on file.   Social History Main Topics  . Smoking status: Former Smoker -- 0.50 packs/day for 20 years    Types: Cigarettes    Quit date: 07/11/1996  . Smokeless tobacco: Not on file  . Alcohol Use: No  . Drug Use: No  . Sexual Activity: Not on file   Other Topics Concern  . Not on file   Social History Narrative   ROS- all systems are reviewed  and negative except as per HPI today  Family History  Problem Relation Age of Onset  . Cancer Mother     breast  . Heart disease Mother   . Diabetes Mother   . Alcohol abuse Father   . Cancer Sister     breast  . Brain cancer Sister     cause of death at 46 years old  . Alcohol abuse Brother   . Heart disease Brother   . Coronary artery disease    . Hypertension    . Heart disease Brother   . Cancer Brother     unknown type   Physical Exam: Filed Vitals:   07/19/14 1117  BP: 132/76  Pulse: 55  Height: 5' 5" (1.651 m)  Weight: 202 lb (91.627 kg)    GEN- The patient is elderly appearing, alert and oriented x 3 today.   Head- normocephalic, atraumatic Eyes-  Sclera clear, conjunctiva pink Ears- hearing intact Oropharynx- clear Neck- supple, no JVP Lymph- no cervical lymphadenopathy Lungs- Clear to ausculation bilaterally, normal work of breathing Heart- irregular rate and rhythm, no murmurs, rubs or gallops,  GI- soft, NT, ND, + BS Extremities- no clubbing, cyanosis, trace pedal edema  LINQ interrogation is reviewed, afib burden 43.9% ekg today reveals afib, V rate 95 bpm, LVH Echo 2014 reveals biventricular failure (EF 20%), mild MR, LA 40 mm  Assessment and Plan:  1. Dizziness, presyncope  LINQ reviewed and reveals post termination pauses of up to 5 seconds. Options including pacing or ablation (See below)  2. Persistent afib Chads2vasc score is at least 7.  Continue coumadin afib burden by LINQ- presently 56.8% She has likely rate related cardiomyopathy, post termination pauses, and symptomatic AF.  She has failed medical therapy with tikosyn.  She has chronic restrictive lung disease and therefore amiodarone is really not a good option.  I do not have any good rhythm controlling medicines but did discuss GENETIC AF study.   Therapeutic strategies for afib including BIV pacemaker + AV nodal ablation vs pulmonary vein isolation ablation were discussed in detail  with the patient today. Risk, benefits, and alternatives to EP study and radiofrequency ablation for afib were also discussed in detail today. These risks include but are not limited to stroke, bleeding, vascular damage, tamponade, perforation, damage to the esophagus, lungs, and other structures, pulmonary vein stenosis, worsening renal function, and death. The patient understands these risk and wishes to proceed.  We will therefore proceed with catheter ablation at the next available time.  3. Hypertensive cardiovascular disease Stable No change required today  4. Chronic systolic dysfunction euvolemic today Could consider CRT-P   and AV nodal ablation if she fails catheter ablation for her afib Continue medical therapy  5. Restrictive lung disease Avoid amiodarone in the future  

## 2014-10-15 NOTE — Discharge Instructions (Signed)
No driving for 4 days. No lifting over 5 lbs for 1 week. No sexual activity for 1 week.  Keep procedure site clean & dry. If you notice increased pain, swelling, bleeding or pus, call/return!  You may shower, but no soaking baths/hot tubs/pools for 1 week.  ° ° °

## 2014-10-15 NOTE — Anesthesia Preprocedure Evaluation (Addendum)
Anesthesia Evaluation  Patient identified by MRN, date of birth, ID band Patient awake    Reviewed: Allergy & Precautions, NPO status , Patient's Chart, lab work & pertinent test results  History of Anesthesia Complications Negative for: history of anesthetic complications  Airway Mallampati: II  TM Distance: >3 FB Neck ROM: Full    Dental  (+) Dental Advisory Given, Poor Dentition, Teeth Intact   Pulmonary sleep apnea , former smoker,  breath sounds clear to auscultation        Cardiovascular hypertension, + CAD, + Past MI and +CHF Rhythm:Regular Rate:Normal     Neuro/Psych    GI/Hepatic GERD-  ,  Endo/Other  diabetesMorbid obesity  Renal/GU      Musculoskeletal   Abdominal (+) + obese,   Peds  Hematology   Anesthesia Other Findings   Reproductive/Obstetrics                            Anesthesia Physical Anesthesia Plan  ASA: IV  Anesthesia Plan: MAC   Post-op Pain Management:    Induction: Inhalational  Airway Management Planned: Natural Airway and Simple Face Mask  Additional Equipment:   Intra-op Plan:   Post-operative Plan:   Informed Consent: I have reviewed the patients History and Physical, chart, labs and discussed the procedure including the risks, benefits and alternatives for the proposed anesthesia with the patient or authorized representative who has indicated his/her understanding and acceptance.     Plan Discussed with:   Anesthesia Plan Comments:         Anesthesia Quick Evaluation

## 2014-10-15 NOTE — Transfer of Care (Signed)
Immediate Anesthesia Transfer of Care Note  Patient: [name]  Procedure(s) Performed: Procedure(s): Atrial Fibrillation Ablation (N/A)  Patient Location: PACU  Anesthesia Type:MAC  Level of Consciousness: awake and patient cooperative  Airway & Oxygen Therapy: Patient Spontanous Breathing and Patient connected to face mask oxygen  Post-op Assessment: Report given to RN and Post -op Vital signs reviewed and stable  Post vital signs: Reviewed and stable  Last Vitals:  Filed Vitals:   10/15/14 1136  BP:   Pulse:   Temp:   Resp: 14    Complications: No apparent anesthesia complications

## 2014-10-15 NOTE — Interval H&P Note (Signed)
History and Physical Interval Note:  10/15/2014 9:02 AM  [name]  has presented today for surgery, with the diagnosis of A FIB   The various methods of treatment have been discussed with the patient and family. After consideration of risks, benefits and other options for treatment, the patient has consented to  Procedure(s): TRANSESOPHAGEAL ECHOCARDIOGRAM (TEE) (N/A) as a surgical intervention .  The patient's history has been reviewed, patient examined, no change in status, stable for surgery.  I have reviewed the patient's chart and labs.  Questions were answered to the patient's satisfaction.     Nahser, Deloris Ping

## 2014-10-15 NOTE — Progress Notes (Deleted)
  Echocardiogram 2D Echocardiogram has been performed.  Delcie Roch 10/15/2014, 9:40 AM

## 2014-10-16 DIAGNOSIS — I480 Paroxysmal atrial fibrillation: Secondary | ICD-10-CM | POA: Diagnosis not present

## 2014-10-16 DIAGNOSIS — E785 Hyperlipidemia, unspecified: Secondary | ICD-10-CM | POA: Diagnosis not present

## 2014-10-16 DIAGNOSIS — E119 Type 2 diabetes mellitus without complications: Secondary | ICD-10-CM | POA: Diagnosis not present

## 2014-10-16 DIAGNOSIS — I119 Hypertensive heart disease without heart failure: Secondary | ICD-10-CM | POA: Diagnosis not present

## 2014-10-16 DIAGNOSIS — I481 Persistent atrial fibrillation: Secondary | ICD-10-CM | POA: Diagnosis not present

## 2014-10-16 LAB — BASIC METABOLIC PANEL
ANION GAP: 6 (ref 5–15)
BUN: 11 mg/dL (ref 6–20)
CALCIUM: 7.9 mg/dL — AB (ref 8.9–10.3)
CO2: 26 mmol/L (ref 22–32)
Chloride: 107 mmol/L (ref 101–111)
Creatinine, Ser: 0.71 mg/dL (ref 0.44–1.00)
GFR calc Af Amer: >60 mL/min (ref >60)
GLUCOSE: 114 mg/dL — AB (ref 65–99)
POTASSIUM: 4.3 mmol/L (ref 3.5–5.1)
Sodium: 139 mmol/L (ref 135–145)

## 2014-10-16 LAB — PROTIME-INR
INR: 2.39 — ABNORMAL HIGH (ref 0.00–1.49)
Prothrombin Time: 25.8 seconds — ABNORMAL HIGH (ref 11.6–15.2)

## 2014-10-16 MED ORDER — PANTOPRAZOLE SODIUM 40 MG PO TBEC: 40.0000 mg | DELAYED_RELEASE_TABLET | Freq: Every day | ORAL | Status: DC

## 2014-10-16 MED ORDER — METOPROLOL TARTRATE 100 MG PO TABS: 50.0000 mg | ORAL_TABLET | Freq: Two times a day (BID) | ORAL | Status: DC

## 2014-10-16 NOTE — Progress Notes (Signed)
Patient discharged with all personal belongings, daughter present while discussing all discharge instructions and medication.  She is alert and oriented, ambulated in hallway prior to discharge to monitor o2 sats, never dropped below 92% while walking and talking.  Removed IV access out of left forearm without difficulty, femoral vascular site remains at level 0 pulses strong.  Took patient to her daughters personal vehicle via wheelchair.

## 2014-10-16 NOTE — Progress Notes (Signed)
Patient Name: [name] Date of Encounter: 10/16/2014   SUBJECTIVE  Denies chest pain, sob or palpitation.   CURRENT MEDS . dofetilide  250 mcg Oral BID  . metoprolol  50 mg Oral BID  . sodium chloride  3 mL Intravenous Q12H  . [START ON 10/19/2014] warfarin  1.25 mg Oral Once per day on Tue  . warfarin  2.5 mg Oral Once per day on Sun Mon Wed Thu Fri Sat  . Warfarin - Pharmacist Dosing Inpatient   Does not apply q1800    OBJECTIVE  Filed Vitals:   10/15/14 2300 10/16/14 0000 10/16/14 0200 10/16/14 0400  BP:  109/56 109/54 117/47  Pulse: 57 57 58 58  Temp: 98.1 F (36.7 C)   98.8 F (37.1 C)  TempSrc: Oral   Oral  Resp: Height:      Weight:      SpO2: 98% 98% 97% 98%    Intake/Output Summary (Last 24 hours) at 10/16/14 0759 Last data filed at 10/15/14 1316  Gross per 24 hour  Intake    250 ml  Output      0 ml  Net    250 ml   Filed Weights   10/15/14 0758  Weight: 200 lb (90.719 kg)    PHYSICAL EXAM  General: Pleasant, NAD. Neuro: Alert and oriented X 3. Moves all extremities spontaneously. Psych: Normal affect. HEENT:  Normal  Neck: Supple without bruits or JVD. Lungs:  Resp regular and unlabored, CTA. Heart: RRR no s3, s4, or murmurs. Abdomen: Soft, non-tender, non-distended, BS + x 4.  Extremities: No clubbing, cyanosis or edema. DP/PT/Radials 2+ and equal bilaterally.  Accessory Clinical Findings  Basic Metabolic Panel  Recent Labs  10/16/14 0232  NA 139  K 4.3  CL 107  CO2 26  GLUCOSE 114*  BUN 11  CREATININE 0.71  CALCIUM 7.9*    TELE  NSR at rate of 60s. Occasional PVCs.   Radiology/Studies TEE 10/15/14 LV EF: 20% -  25%  ------------------------------------------------------------------- Indications:   Atrial fibrillation - 427.31.  ------------------------------------------------------------------- Study Conclusions  - Left ventricle: Systolic function was severely reduced. The estimated  ejection fraction was in the range of 20% to 25%. - Aortic valve: No evidence of vegetation. - Mitral valve: There was moderate regurgitation. - Left atrium: No evidence of thrombus in the atrial cavity or appendage. There was spontaneous echo contrast (&quot;smoke&quot;). - Right ventricle: The cavity size was mildly dilated. Systolic function was mildly to moderately reduced. - Tricuspid valve: No evidence of vegetation. There was moderate regurgitation.  Atrial Fibrillation Ablation 10/15/14 CONCLUSIONS: 1. Sinus rhythm upon presentation.  2. Rotational Angiography reveals a moderate sized left atrium with four separate pulmonary veins without evidence of pulmonary vein stenosis. 3. Successful electrical isolation and anatomical encircling of all four pulmonary veins with radiofrequency current.  4. Cavo-tricuspid isthmus ablation was performed with complete bidirectional isthmus block achieved.  5. Multiple atypical atrial flutter circuits were demonstrated but were not suitable for mapping/ ablation today 6. No early apparent complications.   ASSESSMENT AND PLAN Active Problems:   A-fib    1. Persistent afib - Had successful  AF ablation. TEE without vegetation or evidence of thrombus; LV EF 20-25%; moderate mitral reg.  - Continue Tikosyn, Coumadin - on tele rate in 60s. Home dose of lopressor  BID. However in hospital her dose is  BID. Likely to low rate, low 50s. Her blood pressure in low (109/54)  to high range (147/62) today.    2. Chronic systolic dysfunction - Euvolemic - Continue lopressor, ramipril, PRN lasix and supplement potassium  Signed, Bhagat,Bhavinkumar PA-C Pager [phone]  I have seen, examined the patient, and reviewed the above assessment and plan. On exam, RRR.  Changes to above are made where necessary.   Will reduce metoprolol to 50mg  bid.  Can decreased further in follow-up in AF clinic to metoprolol 25mg  BID in 4 weeks if afib is  controlled. DC to home today.  Add PPI x 6 weeks.  Co Sign: Hillis Range, MD 10/16/2014 9:33 AM

## 2014-10-18 ENCOUNTER — Telehealth: Payer: Self-pay | Admitting: *Deleted

## 2014-10-18 ENCOUNTER — Encounter (HOSPITAL_COMMUNITY): Payer: Self-pay | Admitting: Cardiovascular Disease

## 2014-10-18 NOTE — Anesthesia Postprocedure Evaluation (Signed)
  Anesthesia Post-op Note  Patient: [name]  Procedure(s) Performed: Procedure(s): Atrial Fibrillation Ablation (N/A)  Patient Location: PACU  Anesthesia Type:General  Level of Consciousness: awake and alert   Airway and Oxygen Therapy: Patient Spontanous Breathing  Post-op Pain: none  Post-op Assessment: Post-op Vital signs reviewed              Post-op Vital Signs: stable  Last Vitals:  Filed Vitals:   10/16/14 0800  BP: 147/62  Pulse: 61  Temp: 36.8 C  Resp: 15    Complications: No apparent anesthesia complications

## 2014-10-18 NOTE — Telephone Encounter (Signed)
tcm

## 2014-10-18 NOTE — Telephone Encounter (Signed)
Transition Care Management Follow-up Telephone Call  How have you been since you were released from the hospital? Tired and weak    Do you understand why you were in the hospital? yes   Do you understand the discharge instrcutions? yes  Items Reviewed:  Medications reviewed: yes  Allergies reviewed: yes  Dietary changes reviewed: yes  Referrals reviewed: yes   Functional Questionnaire:   Activities of Daily Living (ADLs):   She states they are independent in the following: ambulation, bathing and hygiene, feeding, continence, grooming, toileting and dressing States they require assistance with the following: none   Any transportation issues/concerns?: yes - daughter driving   Any patient concerns? no   Confirmed importance and date/time of follow-up visits scheduled: yes   Confirmed with patient if condition begins to worsen call PCP or go to the ER.  Patient was given the Call-a-Nurse line [phone]: yes

## 2014-10-18 NOTE — Telephone Encounter (Signed)
Unable to reach daughter by phone to schedule a follow up hospital visit. Left message on machine for daughter to return our call.

## 2014-10-19 ENCOUNTER — Ambulatory Visit (INDEPENDENT_AMBULATORY_CARE_PROVIDER_SITE_OTHER): Payer: PPO | Admitting: *Deleted

## 2014-10-19 ENCOUNTER — Encounter: Payer: Self-pay | Admitting: Internal Medicine

## 2014-10-19 DIAGNOSIS — R55 Syncope and collapse: Secondary | ICD-10-CM | POA: Diagnosis not present

## 2014-10-19 LAB — CUP PACEART REMOTE DEVICE CHECK
Date Time Interrogation Session: 20160621193112
MDC IDC SET ZONE DETECTION INTERVAL: 400 ms
Zone Setting Detection Interval: 2000 ms
Zone Setting Detection Interval: 3000 ms

## 2014-10-20 LAB — CUP PACEART REMOTE DEVICE CHECK
Date Time Interrogation Session: 20160621193112
MDC IDC SET ZONE DETECTION INTERVAL: 2000 ms
Zone Setting Detection Interval: 3000 ms
Zone Setting Detection Interval: 400 ms

## 2014-10-20 NOTE — Progress Notes (Signed)
Loop recorder 

## 2014-10-22 ENCOUNTER — Ambulatory Visit (INDEPENDENT_AMBULATORY_CARE_PROVIDER_SITE_OTHER): Payer: PPO | Admitting: *Deleted

## 2014-10-22 DIAGNOSIS — Z7901 Long term (current) use of anticoagulants: Secondary | ICD-10-CM

## 2014-10-22 DIAGNOSIS — I480 Paroxysmal atrial fibrillation: Secondary | ICD-10-CM

## 2014-10-22 DIAGNOSIS — Z5181 Encounter for therapeutic drug level monitoring: Secondary | ICD-10-CM | POA: Diagnosis not present

## 2014-10-22 DIAGNOSIS — I4891 Unspecified atrial fibrillation: Secondary | ICD-10-CM

## 2014-10-22 LAB — POCT INR: INR: 1.8

## 2014-10-22 MED ORDER — DOFETILIDE 250 MCG PO CAPS: 250.0000 ug | ORAL_CAPSULE | Freq: Two times a day (BID) | ORAL | Status: DC

## 2014-10-29 ENCOUNTER — Ambulatory Visit (INDEPENDENT_AMBULATORY_CARE_PROVIDER_SITE_OTHER): Payer: PPO | Admitting: Family Medicine

## 2014-10-29 ENCOUNTER — Encounter: Payer: Self-pay | Admitting: Family Medicine

## 2014-10-29 VITALS — BP 128/80 | HR 58 | Temp 98.0°F | Wt 197.0 lb

## 2014-10-29 DIAGNOSIS — I119 Hypertensive heart disease without heart failure: Secondary | ICD-10-CM

## 2014-10-29 DIAGNOSIS — I480 Paroxysmal atrial fibrillation: Secondary | ICD-10-CM

## 2014-10-29 DIAGNOSIS — E785 Hyperlipidemia, unspecified: Secondary | ICD-10-CM

## 2014-10-29 NOTE — Progress Notes (Signed)
Pre visit review using our clinic review tool, if applicable. No additional management support is needed unless otherwise documented below in the visit note. 

## 2014-10-29 NOTE — Progress Notes (Signed)
Subjective:    Patient ID: [name], female    DOB: [birth year], 81 y.o.   MRN: [id number]  HPI  Patient is seen for follow-up regarding recent hospitalization. She has history of atrial fibrillation and atrial flutter and underwent cardiac ablation procedure on 10/15/2014 without complication.  Her other medical problems include history of hypertension, hyperlipidemia, CAD. Cha2DS-VASc score of 7. She has not had any chest pain, dizziness, or palpitations since hospitalization. She remains on Tikosyn, metoprolol, Coumadin, and all her other regular medications. Denies any side effects.    Past Medical History  Diagnosis Date  . DIABETES MELLITUS 09/24/2008  . HYPERLIPIDEMIA 09/24/2008  . HYPERTENSION 09/24/2008  . MI 09/24/2008  . CAD 09/24/2008    [facility] in 1998 tx with PCI to LAD;  myoview 1/12:  no ischemia, EF 45%  . CARDIOMYOPATHY 09/24/2008    ischemic;  echo 4/12:  EF 20-25%, mild MR, mod LAE, mod reduced RVSF, mod RVE, mild RAE, mild TR, PASP 40  . Persistent atrial fibrillation 09/24/2008    s/p DCCV x 4 in past;  Amiod d/c'd 2/2 abnormal PFTs;  Tikosyn load 6/12 with DCCV  . Edema 09/24/2008  . CAROTID BRUIT 09/24/2008  . Anemia   . GERD (gastroesophageal reflux disease)   . Osteopenia   . Tachycardia-bradycardia    Past Surgical History  Procedure Laterality Date  . Fracture surgery  2006    hip  . Knee surgery  2008    broken knee  . Loop recorder implant  11-06-2012    Medtronic LinQ implanted by Dr Johney Frame for palpitaitons and dizziness  . Loop recorder implant N/A 11/06/2012    Procedure: LOOP RECORDER IMPLANT;  Surgeon: Hillis Range, MD;  Location: [facility] CATH LAB;  Service: Cardiovascular;  Laterality: N/A;  . Tee without cardioversion N/A 10/15/2014    Procedure: TRANSESOPHAGEAL ECHOCARDIOGRAM (TEE);  Surgeon: Vesta Mixer, MD;  Location: [facility] ENDOSCOPY;  Service: Cardiovascular;  Laterality: N/A;  . Electrophysiologic study N/A 10/15/2014    Procedure: Atrial Fibrillation  Ablation;  Surgeon: Hillis Range, MD;  Location: [facility] INVASIVE CV LAB;  Service: Cardiovascular;  Laterality: N/A;    reports that she quit smoking about 18 years ago. Her smoking use included Cigarettes. She has a 10 pack-year smoking history. She does not have any smokeless tobacco history on file. She reports that she does not drink alcohol or use illicit drugs. family history includes Alcohol abuse in her brother and father; Brain cancer in her sister; Cancer in her brother, mother, and sister; Coronary artery disease in an other family member; Diabetes in her mother; Heart disease in her brother, brother, and mother; Hypertension in an other family member. Allergies  Allergen Reactions  . Simvastatin Rash     Review of Systems  Constitutional: Negative for fatigue.  Eyes: Negative for visual disturbance.  Respiratory: Negative for cough, chest tightness, shortness of breath and wheezing.   Cardiovascular: Negative for chest pain, palpitations and leg swelling.  Neurological: Negative for dizziness, seizures, syncope, weakness, light-headedness and headaches.  Hematological: Does not bruise/bleed easily.       Objective:   Physical Exam  Constitutional: She appears well-developed and well-nourished.  Cardiovascular: Normal rate and regular rhythm.   Pulmonary/Chest: Effort normal and breath sounds normal. No respiratory distress. She has no wheezes. She has no rales.  Abdominal: Soft. There is no tenderness.  Musculoskeletal: She exhibits no edema.  Neurological: She is alert.  Assessment & Plan:  #1 atrial fibrillation with recent ablation as above. Clinically, appears to be in sinus rhythm today. Continue close follow-up with cardiology. #2 hypertension stable and well controlled #3 hyperlipidemia. Lipids were stable when checked back in November and plan repeat at routine follow-up

## 2014-11-03 ENCOUNTER — Encounter: Payer: Self-pay | Admitting: Cardiology

## 2014-11-04 ENCOUNTER — Ambulatory Visit (INDEPENDENT_AMBULATORY_CARE_PROVIDER_SITE_OTHER): Payer: PPO | Admitting: Surgery

## 2014-11-04 DIAGNOSIS — I480 Paroxysmal atrial fibrillation: Secondary | ICD-10-CM | POA: Diagnosis not present

## 2014-11-04 DIAGNOSIS — Z7901 Long term (current) use of anticoagulants: Secondary | ICD-10-CM

## 2014-11-04 DIAGNOSIS — Z5181 Encounter for therapeutic drug level monitoring: Secondary | ICD-10-CM

## 2014-11-04 DIAGNOSIS — I4891 Unspecified atrial fibrillation: Secondary | ICD-10-CM | POA: Diagnosis not present

## 2014-11-04 LAB — POCT INR: INR: 2.9

## 2014-11-10 ENCOUNTER — Encounter: Payer: Self-pay | Admitting: Cardiology

## 2014-11-11 ENCOUNTER — Encounter: Payer: Self-pay | Admitting: Internal Medicine

## 2014-11-15 ENCOUNTER — Encounter (HOSPITAL_COMMUNITY): Payer: Self-pay | Admitting: Nurse Practitioner

## 2014-11-15 ENCOUNTER — Ambulatory Visit (HOSPITAL_COMMUNITY)
Admission: RE | Admit: 2014-11-15 | Discharge: 2014-11-15 | Disposition: A | Discharge status: Home / Self Care | Payer: PPO | Source: Ambulatory Visit | Attending: Nurse Practitioner | Admitting: Nurse Practitioner

## 2014-11-15 VITALS — BP 150/80 | HR 58 | Ht 68.0 in | Wt 198.8 lb

## 2014-11-15 DIAGNOSIS — I481 Persistent atrial fibrillation: Secondary | ICD-10-CM | POA: Diagnosis present

## 2014-11-15 DIAGNOSIS — I4819 Other persistent atrial fibrillation: Secondary | ICD-10-CM

## 2014-11-15 LAB — BASIC METABOLIC PANEL
Anion gap: 6 (ref 5–15)
BUN: 7 mg/dL (ref 6–20)
CO2: 31 mmol/L (ref 22–32)
Calcium: 9.2 mg/dL (ref 8.9–10.3)
Chloride: 101 mmol/L (ref 101–111)
Creatinine, Ser: 0.65 mg/dL (ref 0.44–1.00)
GFR calc Af Amer: >60 mL/min (ref >60)
GFR calc non Af Amer: >60 mL/min (ref >60)
GLUCOSE: 106 mg/dL — AB (ref 65–99)
Potassium: 4.5 mmol/L (ref 3.5–5.1)
Sodium: 138 mmol/L (ref 135–145)

## 2014-11-15 LAB — MAGNESIUM: MAGNESIUM: 1.8 mg/dL (ref 1.7–2.4)

## 2014-11-15 MED ORDER — MAGNESIUM OXIDE 400 MG PO CAPS: 400.0000 mg | ORAL_CAPSULE | Freq: Every day | ORAL | Status: DC

## 2014-11-15 NOTE — Progress Notes (Signed)
Patient ID: [name], female   DOB: 1929-05-27, 81 y.o.   MRN: [id number]     Primary Care Physician: [name], MD Referring Physician: Dr. Jenel Lucks [name] is a 81 y.o. female with a h/o persistent afib, , anticoagulated with coumadin,with LINQ, post termination pauses up to 5 seconds. She had failed tikosyn therapy,device shows afib burden  56.8%. Not a good candidate for amiodarone due to lung disease. Decision was made to pursue PVI. She reports that she did well and denies any rt groin pain or swallowing difficulties. She denies having any awareness of afib but LinQ shows that she still is having some afib episodes and one 5 second pause was noted that she was unaware.  Today, she denies symptoms of palpitations, chest pain, shortness of breath, orthopnea, PND, lower extremity edema, dizziness, presyncope, syncope, or neurologic sequela. The patient is tolerating medications without difficulties and is otherwise without complaint today.   Past Medical History  Diagnosis Date  . DIABETES MELLITUS 09/24/2008  . HYPERLIPIDEMIA 09/24/2008  . HYPERTENSION 09/24/2008  . MI 09/24/2008  . CAD 09/24/2008    [facility]I in 1998 tx with PCI to LAD;  myoview 1/12:  no ischemia, EF 45%  . CARDIOMYOPATHY 09/24/2008    ischemic;  echo 4/12:  EF 20-25%, mild MR, mod LAE, mod reduced RVSF, mod RVE, mild RAE, mild TR, PASP 40  . Persistent atrial fibrillation 09/24/2008    s/p DCCV x 4 in past;  Amiod d/c'd 2/2 abnormal PFTs;  Tikosyn load 6/12 with DCCV  . Edema 09/24/2008  . CAROTID BRUIT 09/24/2008  . Anemia   . GERD (gastroesophageal reflux disease)   . Osteopenia   . Tachycardia-bradycardia    Past Surgical History  Procedure Laterality Date  . Fracture surgery  2006    hip  . Knee surgery  2008    broken knee  . Loop recorder implant  11-06-2012    Medtronic LinQ implanted by Dr Johney Frame for palpitaitons and dizziness  . Loop recorder implant N/A 11/06/2012    Procedure: LOOP RECORDER IMPLANT;   Surgeon: Hillis Range, MD;  Location: [facility] CATH LAB;  Service: Cardiovascular;  Laterality: N/A;  . Tee without cardioversion N/A 10/15/2014    Procedure: TRANSESOPHAGEAL ECHOCARDIOGRAM (TEE);  Surgeon: Vesta Mixer, MD;  Location: [facility] ENDOSCOPY;  Service: Cardiovascular;  Laterality: N/A;  . Electrophysiologic study N/A 10/15/2014    Procedure: Atrial Fibrillation Ablation;  Surgeon: Hillis Range, MD;  Location: [facility] INVASIVE CV LAB;  Service: Cardiovascular;  Laterality: N/A;    Current Outpatient Prescriptions  Medication Sig Dispense Refill  . aspirin 81 MG EC tablet Take 81 mg by mouth daily.      Marland Kitchen atorvastatin (LIPITOR) 10 MG tablet Take 0.5 tablets (5 mg total) by mouth daily. 45 tablet 1  . dofetilide (TIKOSYN) 250 MCG capsule Take 1 capsule (250 mcg total) by mouth 2 (two) times daily. 180 capsule 1  . furosemide (LASIX) 40 MG tablet Take 1 tablet (40 mg total) by mouth daily as needed for fluid. 90 tablet 1  . metoprolol (LOPRESSOR) 100 MG tablet Take 0.5 tablets (50 mg total) by mouth 2 (two) times daily. 180 tablet 0  . Multiple Vitamin (MULTIVITAMIN PO) Take 1 tablet by mouth daily.      . nitroGLYCERIN (NITROSTAT) 0.4 MG SL tablet Place 1 tablet (0.4 mg total) under the tongue every 5 (five) minutes as needed. (Patient taking differently: Place 0.4 mg under the tongue every 5 (  five) minutes as needed for chest pain (MAX 3 TABLETS). ) 25 tablet 12  . pantoprazole (PROTONIX) 40 MG tablet Take 1 tablet (40 mg total) by mouth daily. 45 tablet 0  . potassium chloride (MICRO-K) 10 MEQ CR capsule Take 1 capsule (10 mEq total) by mouth daily as needed (for potassium repacement when taking lasix). 90 capsule 1  . ramipril (ALTACE) 10 MG capsule Take 2 capsules (20 mg total) by mouth daily. 180 capsule 1  . vitamin E 100 UNIT capsule Take 100 Units by mouth daily.      Marland Kitchen warfarin (COUMADIN) 2.5 MG tablet Take as directed by coumadin clinic 105 tablet 3  . Magnesium Oxide 400 MG CAPS Take 1  capsule (400 mg total) by mouth daily.  0   No current facility-administered medications for this encounter.    Allergies  Allergen Reactions  . Simvastatin Rash    History   Social History  . Marital Status: Widowed    Spouse Name: N/A  . Number of Children: N/A  . Years of Education: N/A   Occupational History  . Not on file.   Social History Main Topics  . Smoking status: Former Smoker -- 0.50 packs/day for 20 years    Types: Cigarettes    Quit date: 07/11/1996  . Smokeless tobacco: Not on file  . Alcohol Use: No  . Drug Use: No  . Sexual Activity: Not on file   Other Topics Concern  . Not on file   Social History Narrative    Family History  Problem Relation Age of Onset  . Cancer Mother     breast  . Heart disease Mother   . Diabetes Mother   . Alcohol abuse Father   . Cancer Sister     breast  . Brain cancer Sister     cause of death at 79 years old  . Alcohol abuse Brother   . Heart disease Brother   . Coronary artery disease    . Hypertension    . Heart disease Brother   . Cancer Brother     unknown type    ROS- All systems are reviewed and negative except as per the HPI above  Physical Exam: Filed Vitals:   11/15/14 1052  BP: 150/80  Pulse: 58  Height: 5\' 8"  (1.727 m)  Weight: 198 lb 12.8 oz (90.175 kg)    GEN- The patient is well appearing, alert and oriented x 3 today.   Head- normocephalic, atraumatic Eyes-  Sclera clear, conjunctiva pink Ears- hearing intact Oropharynx- clear Neck- supple, no JVP Lymph- no cervical lymphadenopathy Lungs- Clear to ausculation bilaterally, normal work of breathing Heart- Regular rate and rhythm, no murmurs, rubs or gallops, PMI not laterally displaced GI- soft, NT, ND, + BS Extremities- no clubbing, cyanosis, or edema MS- no significant deformity or atrophy Skin- no rash or lesion Psych- euthymic mood, full affect Neuro- strength and sensation are intact  EKG- Sinus brady at 58 bpm, LVH  with repolarization abnormality Epic records   Assessment and Plan:  1. Persistent afib/flutter s/p ablation 6/24 SBrady today,fells well. Linq reviewed and did show reduction in afib burden to 15% One 5 second pause for which pt was asymptomatic Continue tikosyn, bmet/mag today. Continue coumadin for chadsvasc score of 7   2. Post termination pauses Continue to monitor via Linq Currently asymptomatic  F/u Dr. Johney Frame 9/26 as scheduled. F/u afib clinic per direction of Dr. Johney Frame after above appointment

## 2014-11-18 ENCOUNTER — Ambulatory Visit (INDEPENDENT_AMBULATORY_CARE_PROVIDER_SITE_OTHER): Payer: PPO | Admitting: *Deleted

## 2014-11-18 DIAGNOSIS — R55 Syncope and collapse: Secondary | ICD-10-CM | POA: Diagnosis not present

## 2014-11-24 ENCOUNTER — Ambulatory Visit (INDEPENDENT_AMBULATORY_CARE_PROVIDER_SITE_OTHER): Payer: PPO | Admitting: Surgery

## 2014-11-24 DIAGNOSIS — Z7901 Long term (current) use of anticoagulants: Secondary | ICD-10-CM | POA: Diagnosis not present

## 2014-11-24 DIAGNOSIS — I4891 Unspecified atrial fibrillation: Secondary | ICD-10-CM | POA: Diagnosis not present

## 2014-11-24 DIAGNOSIS — I481 Persistent atrial fibrillation: Secondary | ICD-10-CM | POA: Diagnosis not present

## 2014-11-24 DIAGNOSIS — Z5181 Encounter for therapeutic drug level monitoring: Secondary | ICD-10-CM

## 2014-11-24 DIAGNOSIS — I4819 Other persistent atrial fibrillation: Secondary | ICD-10-CM

## 2014-11-24 LAB — POCT INR: INR: 2.8

## 2014-11-29 ENCOUNTER — Ambulatory Visit (HOSPITAL_COMMUNITY)
Admission: RE | Admit: 2014-11-29 | Discharge: 2014-11-29 | Disposition: A | Discharge status: Home / Self Care | Payer: PPO | Source: Ambulatory Visit | Attending: Nurse Practitioner | Admitting: Nurse Practitioner

## 2014-11-29 DIAGNOSIS — I481 Persistent atrial fibrillation: Secondary | ICD-10-CM

## 2014-11-29 DIAGNOSIS — I480 Paroxysmal atrial fibrillation: Secondary | ICD-10-CM | POA: Diagnosis not present

## 2014-11-29 LAB — MAGNESIUM: MAGNESIUM: 1.9 mg/dL (ref 1.7–2.4)

## 2014-11-29 NOTE — Progress Notes (Signed)
Loop recorder 

## 2014-12-02 ENCOUNTER — Encounter: Payer: Self-pay | Admitting: Internal Medicine

## 2014-12-03 LAB — CUP PACEART REMOTE DEVICE CHECK: Date Time Interrogation Session: 20160812165615

## 2014-12-08 ENCOUNTER — Encounter: Payer: Self-pay | Admitting: Cardiology

## 2014-12-09 ENCOUNTER — Encounter: Payer: Self-pay | Admitting: Internal Medicine

## 2014-12-16 ENCOUNTER — Encounter: Payer: Self-pay | Admitting: Cardiology

## 2014-12-17 ENCOUNTER — Ambulatory Visit (INDEPENDENT_AMBULATORY_CARE_PROVIDER_SITE_OTHER): Payer: PPO | Admitting: *Deleted

## 2014-12-17 DIAGNOSIS — R55 Syncope and collapse: Secondary | ICD-10-CM | POA: Diagnosis not present

## 2014-12-22 ENCOUNTER — Ambulatory Visit (INDEPENDENT_AMBULATORY_CARE_PROVIDER_SITE_OTHER): Payer: PPO | Admitting: *Deleted

## 2014-12-22 ENCOUNTER — Telehealth (HOSPITAL_COMMUNITY): Payer: Self-pay | Admitting: *Deleted

## 2014-12-22 ENCOUNTER — Encounter: Payer: Self-pay | Admitting: *Deleted

## 2014-12-22 ENCOUNTER — Other Ambulatory Visit: Payer: Self-pay | Admitting: Internal Medicine

## 2014-12-22 DIAGNOSIS — I481 Persistent atrial fibrillation: Secondary | ICD-10-CM | POA: Diagnosis not present

## 2014-12-22 DIAGNOSIS — I4891 Unspecified atrial fibrillation: Secondary | ICD-10-CM | POA: Diagnosis not present

## 2014-12-22 DIAGNOSIS — Z7901 Long term (current) use of anticoagulants: Secondary | ICD-10-CM

## 2014-12-22 DIAGNOSIS — I4819 Other persistent atrial fibrillation: Secondary | ICD-10-CM

## 2014-12-22 DIAGNOSIS — Z5181 Encounter for therapeutic drug level monitoring: Secondary | ICD-10-CM

## 2014-12-22 LAB — POCT INR: INR: 2.1

## 2014-12-22 NOTE — Telephone Encounter (Addendum)
Raul Del, RN  Newman Nip, NP Cc: Shona Simpson, RN           Pt seen in Coumadin Clinic today, while coming down hall to clinic she c/o of feeling funny and very tired. B/P check 110/70, O2 sat 97%, Glucose 102, HR 46 after several minutes, pulse remained 46. Pt denied Chest pain. Pt also states after last lab work she didn't know what to do so she stop taking her magnesium. Instructed her to resume that today when she gets home and continue that daily until further instruction from her providers. Please follow up with pt as she is on her way home. She has pulse oximeter at home and will recheck her pulse and call immediately if is drops lower.      1030: Was called by coumadin clinic regarding above episode. Offered patient appointment to come be assessed while at coumadin clinic this morning but patient declined.  Discussed with Rudi Coco, NP and would like to decrease metoprolol to 25mg  BID and recheck pt in 1 week.  Left message for patient to call me to go over changes and to make appointment.     1355: talked with patient who claims since being home she has felt fine she's checked her vital signs several times and HR 90s and BP 110/70s.  States she has been doing housework since coming home.    Patient states she has not felt the way she did this morning at coumadin clinic since having her ablation so does not really want to change anything unless it happens again. Discussed with Rudi Coco NP to see if she still wants to change metoprolol since HR is consistently staying in 90s since being home and will keep on normal dose of metoprolol 50mg  BID for now. Patient to call if any further problems or concerns.

## 2014-12-22 NOTE — Progress Notes (Signed)
Loop recorder 

## 2014-12-23 ENCOUNTER — Encounter: Payer: Self-pay | Admitting: Cardiology

## 2014-12-31 LAB — CUP PACEART REMOTE DEVICE CHECK: Date Time Interrogation Session: 20160909121051

## 2014-12-31 NOTE — Progress Notes (Signed)
Carelink summary report received. Battery status OK. Normal device function. No new symptom episodes, tachy episodes, brady, or pause episodes. No new AF episodes, +warfarin. Monthly summary reports and ROV with JA on 01/17/2015 at 9:30am.

## 2015-01-11 ENCOUNTER — Ambulatory Visit (HOSPITAL_COMMUNITY): Payer: PPO | Admitting: Nurse Practitioner

## 2015-01-12 ENCOUNTER — Encounter: Payer: Self-pay | Admitting: *Deleted

## 2015-01-17 ENCOUNTER — Encounter: Payer: PPO | Admitting: *Deleted

## 2015-01-17 ENCOUNTER — Ambulatory Visit (INDEPENDENT_AMBULATORY_CARE_PROVIDER_SITE_OTHER): Payer: PPO | Admitting: Internal Medicine

## 2015-01-17 ENCOUNTER — Ambulatory Visit (INDEPENDENT_AMBULATORY_CARE_PROVIDER_SITE_OTHER): Payer: PPO | Admitting: *Deleted

## 2015-01-17 ENCOUNTER — Encounter: Payer: Self-pay | Admitting: Internal Medicine

## 2015-01-17 ENCOUNTER — Encounter: Payer: Self-pay | Admitting: *Deleted

## 2015-01-17 VITALS — BP 126/72 | HR 94 | Ht 65.0 in | Wt 205.2 lb

## 2015-01-17 DIAGNOSIS — I4891 Unspecified atrial fibrillation: Secondary | ICD-10-CM

## 2015-01-17 DIAGNOSIS — R55 Syncope and collapse: Secondary | ICD-10-CM | POA: Diagnosis not present

## 2015-01-17 DIAGNOSIS — I484 Atypical atrial flutter: Secondary | ICD-10-CM | POA: Insufficient documentation

## 2015-01-17 DIAGNOSIS — Z5181 Encounter for therapeutic drug level monitoring: Secondary | ICD-10-CM

## 2015-01-17 DIAGNOSIS — I480 Paroxysmal atrial fibrillation: Secondary | ICD-10-CM | POA: Diagnosis not present

## 2015-01-17 DIAGNOSIS — Z7901 Long term (current) use of anticoagulants: Secondary | ICD-10-CM | POA: Diagnosis not present

## 2015-01-17 DIAGNOSIS — I119 Hypertensive heart disease without heart failure: Secondary | ICD-10-CM

## 2015-01-17 DIAGNOSIS — I4819 Other persistent atrial fibrillation: Secondary | ICD-10-CM

## 2015-01-17 DIAGNOSIS — I481 Persistent atrial fibrillation: Secondary | ICD-10-CM

## 2015-01-17 LAB — CUP PACEART INCLINIC DEVICE CHECK: Date Time Interrogation Session: 20160926105742

## 2015-01-17 LAB — POCT INR: INR: 3.7

## 2015-01-17 NOTE — Patient Instructions (Signed)
Medication Instructions: - no changes  Labwork: - Your physician recommends that you return for lab work the day of your next Coumadin Check- Monday 01/31/15: BMP/ CBC  Procedures/Testing: - Your physician has recommended that you have a Cardioversion (DCCV). Electrical Cardioversion uses a jolt of electricity to your heart either through paddles or wired patches attached to your chest. This is a controlled, usually prescheduled, procedure. Defibrillation is done under light anesthesia in the hospital, and you usually go home the day of the procedure. This is done to get your heart back into a normal rhythm. You are not awake for the procedure. Please see the instruction sheet given to you today.  Follow-Up: - Your physician recommends that you schedule a follow-up appointment in: 4 weeks, after your Cardioversion, with Rudi Coco, NP (A-fib clinic)- around the 03/03/15  - Your physician recommends that you schedule a follow-up appointment in: 3 months with Dr. Johney Frame.  Any Additional Special Instructions Will Be Listed Below (If Applicable). - none

## 2015-01-17 NOTE — Progress Notes (Signed)
Electrophysiology Office Note   Date:  01/17/2015   ID:  [name], DOB [birth year], MRN [id number]  PCP:  [name], MD  Cardiologist:  Dr Eden Emms Primary Electrophysiologist: Hillis Range, MD    Chief Complaint  Patient presents with  . Persistent AFIB     History of Present Illness: [name] is a 81 y.o. female who presents today for electrophysiology evaluation.   She has done very well since her afib ablation.  She reports feeling "great for about a month" afterwards but has noticed increased heart rates and fatigue since.  Her dizziness is resolved.  Today, she denies symptoms of chest pain, shortness of breath, orthopnea, PND, lower extremity edema, claudication, presyncope, syncope, bleeding, or neurologic sequela. The patient is tolerating medications without difficulties and is otherwise without complaint today.    Past Medical History  Diagnosis Date  . DIABETES MELLITUS 09/24/2008  . HYPERLIPIDEMIA 09/24/2008  . HYPERTENSION 09/24/2008  . MI 09/24/2008  . CAD 09/24/2008    [facility]I in 1998 tx with PCI to LAD;  myoview 1/12:  no ischemia, EF 45%  . CARDIOMYOPATHY 09/24/2008    ischemic;  echo 4/12:  EF 20-25%, mild MR, mod LAE, mod reduced RVSF, mod RVE, mild RAE, mild TR, PASP 40  . Persistent atrial fibrillation 09/24/2008    s/p DCCV x 4 in past;  Amiod d/c'd 2/2 abnormal PFTs;  Tikosyn load 6/12 with DCCV  . Edema 09/24/2008  . CAROTID BRUIT 09/24/2008  . Anemia   . GERD (gastroesophageal reflux disease)   . Osteopenia   . Tachycardia-bradycardia    Past Surgical History  Procedure Laterality Date  . Hip fracture surgery  2006  . Knee surgery  2008    broken knee  . Loop recorder implant N/A 11/06/2012    Procedure: LOOP RECORDER IMPLANT;  Surgeon: Hillis Range, MD;  Location: [facility] CATH LAB;  Service: Cardiovascular;  Laterality: N/A;Medtronic LinQ implanted by Dr Johney Frame for palpitaitons and dizziness  . Tee without cardioversion N/A 10/15/2014    Procedure:  TRANSESOPHAGEAL ECHOCARDIOGRAM (TEE);  Surgeon: Vesta Mixer, MD;  Location: [facility] ENDOSCOPY;  Service: Cardiovascular;  Laterality: N/A;  . Electrophysiologic study N/A 10/15/2014    PVI and CTI ablation by Dr Johney Frame     Current Outpatient Prescriptions  Medication Sig Dispense Refill  . aspirin 81 MG EC tablet Take 81 mg by mouth daily.      Marland Kitchen atorvastatin (LIPITOR) 10 MG tablet Take 0.5 tablets (5 mg total) by mouth daily. 45 tablet 1  . dofetilide (TIKOSYN) 250 MCG capsule Take 1 capsule (250 mcg total) by mouth 2 (two) times daily. 180 capsule 1  . furosemide (LASIX) 40 MG tablet Take 1 tablet (40 mg total) by mouth daily as needed for fluid. 90 tablet 1  . Magnesium Oxide 400 MG CAPS Take 1 capsule (400 mg total) by mouth daily.  0  . metoprolol (LOPRESSOR) 100 MG tablet Take 0.5 tablets (50 mg total) by mouth 2 (two) times daily. 180 tablet 0  . Multiple Vitamin (MULTIVITAMIN PO) Take 1 tablet by mouth daily.      . nitroGLYCERIN (NITROSTAT) 0.4 MG SL tablet Place 0.4 mg under the tongue every 5 (five) minutes x 3 doses as needed for chest pain.    . pantoprazole (PROTONIX) 40 MG tablet Take 1 tablet (40 mg total) by mouth daily. 45 tablet 0  . potassium chloride (MICRO-K) 10 MEQ CR capsule Take 1 capsule (10 mEq total) by  mouth daily as needed (for potassium repacement when taking lasix). 90 capsule 1  . ramipril (ALTACE) 10 MG capsule Take 2 capsules (20 mg total) by mouth daily. 180 capsule 1  . vitamin E 100 UNIT capsule Take 100 Units by mouth daily.      Marland Kitchen warfarin (COUMADIN) 2.5 MG tablet Take as directed by coumadin clinic 105 tablet 3   No current facility-administered medications for this visit.    Allergies:   Simvastatin   Social History:  The patient  reports that she quit smoking about 18 years ago. Her smoking use included Cigarettes. She has a 10 pack-year smoking history. She does not have any smokeless tobacco history on file. She reports that she does not drink  alcohol or use illicit drugs.   Family History:  The patient's  family history includes Alcohol abuse in her brother and father; Brain cancer in her sister; Breast cancer in her mother and sister; Cancer in her brother; Coronary artery disease in an other family member; Diabetes in her mother; Heart attack in her brother and brother; Heart disease in her brother, brother, and mother; Hypertension in an other family member.    ROS:  Please see the history of present illness.   All other systems are reviewed and negative.    PHYSICAL EXAM: VS:  BP 126/72 mmHg  Pulse 94  Ht  (1.651 m)  Wt 205 lb 3.2 oz (93.078 kg)  BMI 34.15 kg/m2 , BMI Body mass index is 34.15 kg/(m^2). GEN: Well nourished, well developed, in no acute distress HEENT: normal Neck: no JVD, carotid bruits, or masses Cardiac: iRRR; no murmurs, rubs, or gallops,no edema  Respiratory:  clear to auscultation bilaterally, normal work of breathing GI: soft, nontender, nondistended, + BS MS: no deformity or atrophy Skin: warm and dry  Neuro:  Strength and sensation are intact Psych: euthymic mood, full affect  EKG:  EKG is ordered today. The ekg ordered today shows atypical atrial flutter  ILR is reviewed and reveals 6% AF (down from 56% preablation)   Recent Labs: 02/22/2014: ALT 15 10/04/2014: Hemoglobin 14.2; Platelets 298.0 11/15/2014: BUN 7; Creatinine, Ser 0.65; Potassium 4.5; Sodium 138 11/29/2014: Magnesium 1.9    Lipid Panel     Component Value Date/Time   CHOL 171 02/22/2014 1054   TRIG 173.0* 02/22/2014 1054   HDL 40.20 02/22/2014 1054   CHOLHDL 4 02/22/2014 1054   VLDL 34.6 02/22/2014 1054   LDLCALC 96 02/22/2014 1054     Wt Readings from Last 3 Encounters:  01/17/15 205 lb 3.2 oz (93.078 kg)  11/15/14 198 lb 12.8 oz (90.175 kg)  10/29/14 197 lb (89.359 kg)     ASSESSMENT AND PLAN:  1.  Atypical atrial flutter She is symptomatic with this She has had multiple atypical atrial flutter  circuits at recent ablation.  I am concerned that she is at risks for additional atrial flutters in the future.  Will proceed with cardioversion.  Risks of procedure discussed with patient.  She has been therapeutic on coumadin. Continue tikosyn. Would be reluctant to consider amiodarone given restrictive lung disease, but could reassess with PFTs if needed   2. Atrial fibrillation Much improved post ablation I am optimistic Continue coumadin Chads2vasc score is at least 7.    3. Hypertensive cardiovascular disease Stable No change required today  4. Chronic systolic dysfunction euvolemic today Could consider CRT-P and AV nodal ablation down the road if needed.  For now would prefer to avoid devices  if possible Continue medical therapy  5. Restrictive lung disease As above  6. Post termination pauses/ dizziness Improved Followed with ILR   Follow-up in the AF clinic in 4 weeks I will see again in 3 months  Current medicines are reviewed at length with the patient today.   The patient does not have concerns regarding her medicines.  The following changes were made today:  none    Signed, Hillis Range, MD  01/17/2015 10:26 AM     [facility] [address] [place] [address] [phone] (office) [phone] (fax)

## 2015-01-20 NOTE — Progress Notes (Signed)
Loop recorder 

## 2015-01-24 ENCOUNTER — Encounter: Payer: Self-pay | Admitting: Internal Medicine

## 2015-01-27 LAB — CUP PACEART REMOTE DEVICE CHECK: MDC IDC SESS DTM: 20160926210726

## 2015-01-27 NOTE — Progress Notes (Signed)
Carelink summary report received. Battery status OK. Normal device function. No new symptom, brady, or pause episodes. 1 tachy episode--RVR. 332 AF episodes (burden 4.2%), +warfarin and Tikosyn. Monthly summary reports, plan for cardioversion on 02/03/15 and ROV with AF clinic on 02/28/15 at 8:30am.

## 2015-01-31 ENCOUNTER — Other Ambulatory Visit (INDEPENDENT_AMBULATORY_CARE_PROVIDER_SITE_OTHER): Payer: PPO | Admitting: *Deleted

## 2015-01-31 ENCOUNTER — Ambulatory Visit (INDEPENDENT_AMBULATORY_CARE_PROVIDER_SITE_OTHER): Payer: PPO

## 2015-01-31 DIAGNOSIS — I484 Atypical atrial flutter: Secondary | ICD-10-CM

## 2015-01-31 DIAGNOSIS — Z5181 Encounter for therapeutic drug level monitoring: Secondary | ICD-10-CM | POA: Diagnosis not present

## 2015-01-31 DIAGNOSIS — I4891 Unspecified atrial fibrillation: Secondary | ICD-10-CM

## 2015-01-31 DIAGNOSIS — I481 Persistent atrial fibrillation: Secondary | ICD-10-CM | POA: Diagnosis not present

## 2015-01-31 DIAGNOSIS — Z7901 Long term (current) use of anticoagulants: Secondary | ICD-10-CM | POA: Diagnosis not present

## 2015-01-31 DIAGNOSIS — I119 Hypertensive heart disease without heart failure: Secondary | ICD-10-CM

## 2015-01-31 DIAGNOSIS — I4819 Other persistent atrial fibrillation: Secondary | ICD-10-CM

## 2015-01-31 DIAGNOSIS — I480 Paroxysmal atrial fibrillation: Secondary | ICD-10-CM

## 2015-01-31 LAB — CBC WITH DIFFERENTIAL/PLATELET
BASOS ABS: 0.1 10*3/uL (ref 0.0–0.1)
BASOS PCT: 1 % (ref 0–1)
EOS ABS: 0.1 10*3/uL (ref 0.0–0.7)
Eosinophils Relative: 1 % (ref 0–5)
HEMATOCRIT: 39.5 % (ref 36.0–46.0)
Hemoglobin: 13.7 g/dL (ref 12.0–15.0)
Lymphocytes Relative: 21 % (ref 12–46)
Lymphs Abs: 2.1 10*3/uL (ref 0.7–4.0)
MCH: 31.1 pg (ref 26.0–34.0)
MCHC: 34.7 g/dL (ref 30.0–36.0)
MCV: 89.6 fL (ref 78.0–100.0)
MONO ABS: 1.3 10*3/uL — AB (ref 0.1–1.0)
MPV: 9.6 fL (ref 8.6–12.4)
Monocytes Relative: 13 % — ABNORMAL HIGH (ref 3–12)
Neutro Abs: 6.4 10*3/uL (ref 1.7–7.7)
Neutrophils Relative %: 64 % (ref 43–77)
PLATELETS: 317 10*3/uL (ref 150–400)
RBC: 4.41 MIL/uL (ref 3.87–5.11)
RDW: 14.2 % (ref 11.5–15.5)
WBC: 10 10*3/uL (ref 4.0–10.5)

## 2015-01-31 LAB — BASIC METABOLIC PANEL
BUN: 12 mg/dL (ref 7–25)
CALCIUM: 9.5 mg/dL (ref 8.6–10.4)
CO2: 26 mmol/L (ref 20–31)
CREATININE: 0.81 mg/dL (ref 0.60–0.88)
Chloride: 102 mmol/L (ref 98–110)
GLUCOSE: 78 mg/dL (ref 65–99)
POTASSIUM: 4.5 mmol/L (ref 3.5–5.3)
Sodium: 138 mmol/L (ref 135–146)

## 2015-01-31 LAB — POCT INR: INR: 2.5

## 2015-01-31 NOTE — Addendum Note (Signed)
Addended by: Tonita Phoenix on: 01/31/2015 09:17 AM   Modules accepted: Orders

## 2015-01-31 NOTE — Addendum Note (Signed)
Addended by: Brittie Whisnant K on: 01/31/2015 09:17 AM   Modules accepted: Orders  

## 2015-01-31 NOTE — Addendum Note (Signed)
Addended by: Tonita Phoenix on: 01/31/2015 09:16 AM   Modules accepted: Orders

## 2015-02-03 ENCOUNTER — Ambulatory Visit (HOSPITAL_COMMUNITY): Payer: PPO | Admitting: Certified Registered Nurse Anesthetist

## 2015-02-03 ENCOUNTER — Encounter (HOSPITAL_COMMUNITY)
Admission: RE | Disposition: A | Discharge status: Home / Self Care | Payer: Self-pay | Source: Ambulatory Visit | Attending: Cardiology

## 2015-02-03 ENCOUNTER — Encounter (HOSPITAL_COMMUNITY): Payer: Self-pay | Admitting: Certified Registered Nurse Anesthetist

## 2015-02-03 ENCOUNTER — Ambulatory Visit (HOSPITAL_COMMUNITY)
Admission: RE | Admit: 2015-02-03 | Discharge: 2015-02-03 | Disposition: A | Discharge status: Home / Self Care | Payer: PPO | Source: Ambulatory Visit | Attending: Cardiology | Admitting: Cardiology

## 2015-02-03 DIAGNOSIS — I484 Atypical atrial flutter: Secondary | ICD-10-CM | POA: Diagnosis not present

## 2015-02-03 DIAGNOSIS — I481 Persistent atrial fibrillation: Secondary | ICD-10-CM | POA: Diagnosis not present

## 2015-02-03 DIAGNOSIS — Z87891 Personal history of nicotine dependence: Secondary | ICD-10-CM | POA: Diagnosis not present

## 2015-02-03 DIAGNOSIS — I10 Essential (primary) hypertension: Secondary | ICD-10-CM | POA: Insufficient documentation

## 2015-02-03 DIAGNOSIS — I4891 Unspecified atrial fibrillation: Secondary | ICD-10-CM | POA: Diagnosis not present

## 2015-02-03 DIAGNOSIS — Z8249 Family history of ischemic heart disease and other diseases of the circulatory system: Secondary | ICD-10-CM | POA: Insufficient documentation

## 2015-02-03 DIAGNOSIS — Z7982 Long term (current) use of aspirin: Secondary | ICD-10-CM | POA: Insufficient documentation

## 2015-02-03 DIAGNOSIS — Z7901 Long term (current) use of anticoagulants: Secondary | ICD-10-CM | POA: Insufficient documentation

## 2015-02-03 DIAGNOSIS — I252 Old myocardial infarction: Secondary | ICD-10-CM | POA: Insufficient documentation

## 2015-02-03 DIAGNOSIS — I119 Hypertensive heart disease without heart failure: Secondary | ICD-10-CM | POA: Diagnosis not present

## 2015-02-03 DIAGNOSIS — E785 Hyperlipidemia, unspecified: Secondary | ICD-10-CM | POA: Diagnosis not present

## 2015-02-03 DIAGNOSIS — E119 Type 2 diabetes mellitus without complications: Secondary | ICD-10-CM | POA: Insufficient documentation

## 2015-02-03 HISTORY — PX: CARDIOVERSION: SHX1299

## 2015-02-03 SURGERY — CARDIOVERSION
Anesthesia: General

## 2015-02-03 MED ORDER — PROPOFOL 500 MG/50ML IV EMUL
INTRAVENOUS | Status: DC | PRN
  Administered 2015-02-03: 50 ug/kg/min via INTRAVENOUS

## 2015-02-03 MED ORDER — SODIUM CHLORIDE 0.9 % IV SOLN
INTRAVENOUS | Status: DC
  Administered 2015-02-03: 12:00:00 via INTRAVENOUS

## 2015-02-03 MED ORDER — LIDOCAINE HCL 2 % IJ SOLN
INTRAMUSCULAR | Status: DC | PRN
  Administered 2015-02-03: 40 mg via INTRADERMAL

## 2015-02-03 NOTE — Transfer of Care (Signed)
Immediate Anesthesia Transfer of Care Note  Patient: [name]  Procedure(s) Performed: Procedure(s): CARDIOVERSION (N/A)  Patient Location: Endoscopy Unit  Anesthesia Type:General  Level of Consciousness: awake  Airway & Oxygen Therapy: Patient Spontanous Breathing and Patient connected to face mask oxygen  Post-op Assessment: Report given to RN and Post -op Vital signs reviewed and stable  Post vital signs: Reviewed and stable  Last Vitals:  Filed Vitals:   02/03/15 1100  BP: 143/86  Pulse: 111  Temp: 36.9 C  Resp: 19    Complications: No apparent anesthesia complications

## 2015-02-03 NOTE — Anesthesia Preprocedure Evaluation (Addendum)
Anesthesia Evaluation  Patient identified by MRN, date of birth, ID band Patient awake    Reviewed: Allergy & Precautions, NPO status , Patient's Chart, lab work & pertinent test results  History of Anesthesia Complications Negative for: history of anesthetic complications  Airway Mallampati: II  TM Distance: >3 FB Neck ROM: Full    Dental  (+) Dental Advisory Given   Pulmonary former smoker (quit 1998),    breath sounds clear to auscultation       Cardiovascular hypertension, Pt. on medications and Pt. on home beta blockers (-) angina+ CAD  + dysrhythmias Atrial Fibrillation  Rhythm:Irregular Rate:Normal  6/16 ECHO: 20% to 25%. There is severe hypokinesis of the entire anteroseptal myocardium, mod MR   Neuro/Psych negative neurological ROS     GI/Hepatic Neg liver ROS, GERD  Medicated and Controlled,  Endo/Other  diabetes (no meds)Morbid obesity  Renal/GU negative Renal ROS     Musculoskeletal   Abdominal (+) + obese,   Peds  Hematology  (+) Blood dyscrasia (coumadin), ,   Anesthesia Other Findings   Reproductive/Obstetrics                         Anesthesia Physical Anesthesia Plan  ASA: III  Anesthesia Plan: General   Post-op Pain Management:    Induction: Intravenous  Airway Management Planned: Mask  Additional Equipment:   Intra-op Plan:   Post-operative Plan:   Informed Consent: I have reviewed the patients History and Physical, chart, labs and discussed the procedure including the risks, benefits and alternatives for the proposed anesthesia with the patient or authorized representative who has indicated his/her understanding and acceptance.   Dental advisory given  Plan Discussed with: Anesthesiologist, Surgeon and CRNA  Anesthesia Plan Comments: (Plan routine monitors, GA for cardioversion)       Anesthesia Quick Evaluation

## 2015-02-03 NOTE — Anesthesia Postprocedure Evaluation (Signed)
  Anesthesia Post-op Note  Patient: [name]  Procedure(s) Performed: Procedure(s): CARDIOVERSION (N/A)  Patient Location: Endoscopy Unit  Anesthesia Type:General  Level of Consciousness: awake  Airway and Oxygen Therapy: Patient Spontanous Breathing and Patient connected to face mask oxygen  Post-op Pain: none  Post-op Assessment: Post-op Vital signs reviewed, Patient's Cardiovascular Status Stable, Respiratory Function Stable and Patent Airway              Post-op Vital Signs: Reviewed and stable  Last Vitals:  Filed Vitals:   02/03/15 1253  BP: 133/74  Pulse: 63  Temp:   Resp: 12    Complications: No apparent anesthesia complications

## 2015-02-03 NOTE — Discharge Instructions (Signed)
Electrical Cardioversion, Care After °Refer to this sheet in the next few weeks. These instructions provide you with information on caring for yourself after your procedure. Your health care provider may also give you more specific instructions. Your treatment has been planned according to current medical practices, but problems sometimes occur. Call your health care provider if you have any problems or questions after your procedure. °WHAT TO EXPECT AFTER THE PROCEDURE °After your procedure, it is typical to have the following sensations: °· Some redness on the skin where the shocks were delivered. If this is tender, a sunburn lotion or hydrocortisone cream may help. °· Possible return of an abnormal heart rhythm within hours or days after the procedure. °HOME CARE INSTRUCTIONS °· Take medicines only as directed by your health care provider. Be sure you understand how and when to take your medicine. °· Learn how to feel your pulse and check it often. °· Limit your activity for 48 hours after the procedure or as directed by your health care provider. °· Avoid or minimize caffeine and other stimulants as directed by your health care provider. °SEEK MEDICAL CARE IF: °· You feel like your heart is beating too fast or your pulse is not regular. °· You have any questions about your medicines. °· You have bleeding that will not stop. °SEEK IMMEDIATE MEDICAL CARE IF: °· You are dizzy or feel faint. °· It is hard to breathe or you feel short of breath. °· There is a change in discomfort in your chest. °· Your speech is slurred or you have trouble moving an arm or leg on one side of your body. °· You get a serious muscle cramp that does not go away. °· Your fingers or toes turn cold or blue. °  °This information is not intended to replace advice given to you by your health care provider. Make sure you discuss any questions you have with your health care provider. °  °Document Released: 01/28/2013 Document Revised: 04/30/2014  Document Reviewed: 01/28/2013 °Elsevier Interactive Patient Education ©2016 [employer] ° °

## 2015-02-03 NOTE — H&P (View-Only) (Signed)
Electrophysiology Office Note   Date:  01/17/2015   ID:  [name], DOB [birth year], MRN [id number]  PCP:  [name], MD  Cardiologist:  Dr Eden Emms Primary Electrophysiologist: Hillis Range, MD    Chief Complaint  Patient presents with  . Persistent AFIB     History of Present Illness: [name] is a 81 y.o. female who presents today for electrophysiology evaluation.   She has done very well since her afib ablation.  She reports feeling "great for about a month" afterwards but has noticed increased heart rates and fatigue since.  Her dizziness is resolved.  Today, she denies symptoms of chest pain, shortness of breath, orthopnea, PND, lower extremity edema, claudication, presyncope, syncope, bleeding, or neurologic sequela. The patient is tolerating medications without difficulties and is otherwise without complaint today.    Past Medical History  Diagnosis Date  . DIABETES MELLITUS 09/24/2008  . HYPERLIPIDEMIA 09/24/2008  . HYPERTENSION 09/24/2008  . MI 09/24/2008  . CAD 09/24/2008    [facility]I in 1998 tx with PCI to LAD;  myoview 1/12:  no ischemia, EF 45%  . CARDIOMYOPATHY 09/24/2008    ischemic;  echo 4/12:  EF 20-25%, mild MR, mod LAE, mod reduced RVSF, mod RVE, mild RAE, mild TR, PASP 40  . Persistent atrial fibrillation 09/24/2008    s/p DCCV x 4 in past;  Amiod d/c'd 2/2 abnormal PFTs;  Tikosyn load 6/12 with DCCV  . Edema 09/24/2008  . CAROTID BRUIT 09/24/2008  . Anemia   . GERD (gastroesophageal reflux disease)   . Osteopenia   . Tachycardia-bradycardia    Past Surgical History  Procedure Laterality Date  . Hip fracture surgery  2006  . Knee surgery  2008    broken knee  . Loop recorder implant N/A 11/06/2012    Procedure: LOOP RECORDER IMPLANT;  Surgeon: Hillis Range, MD;  Location: [facility] CATH LAB;  Service: Cardiovascular;  Laterality: N/A;Medtronic LinQ implanted by Dr Johney Frame for palpitaitons and dizziness  . Tee without cardioversion N/A 10/15/2014    Procedure:  TRANSESOPHAGEAL ECHOCARDIOGRAM (TEE);  Surgeon: Vesta Mixer, MD;  Location: [facility] ENDOSCOPY;  Service: Cardiovascular;  Laterality: N/A;  . Electrophysiologic study N/A 10/15/2014    PVI and CTI ablation by Dr Johney Frame     Current Outpatient Prescriptions  Medication Sig Dispense Refill  . aspirin 81 MG EC tablet Take 81 mg by mouth daily.      Marland Kitchen atorvastatin (LIPITOR) 10 MG tablet Take 0.5 tablets (5 mg total) by mouth daily. 45 tablet 1  . dofetilide (TIKOSYN) 250 MCG capsule Take 1 capsule (250 mcg total) by mouth 2 (two) times daily. 180 capsule 1  . furosemide (LASIX) 40 MG tablet Take 1 tablet (40 mg total) by mouth daily as needed for fluid. 90 tablet 1  . Magnesium Oxide 400 MG CAPS Take 1 capsule (400 mg total) by mouth daily.  0  . metoprolol (LOPRESSOR) 100 MG tablet Take 0.5 tablets (50 mg total) by mouth 2 (two) times daily. 180 tablet 0  . Multiple Vitamin (MULTIVITAMIN PO) Take 1 tablet by mouth daily.      . nitroGLYCERIN (NITROSTAT) 0.4 MG SL tablet Place 0.4 mg under the tongue every 5 (five) minutes x 3 doses as needed for chest pain.    . pantoprazole (PROTONIX) 40 MG tablet Take 1 tablet (40 mg total) by mouth daily. 45 tablet 0  . potassium chloride (MICRO-K) 10 MEQ CR capsule Take 1 capsule (10 mEq total) by  mouth daily as needed (for potassium repacement when taking lasix). 90 capsule 1  . ramipril (ALTACE) 10 MG capsule Take 2 capsules (20 mg total) by mouth daily. 180 capsule 1  . vitamin E 100 UNIT capsule Take 100 Units by mouth daily.      Marland Kitchen warfarin (COUMADIN) 2.5 MG tablet Take as directed by coumadin clinic 105 tablet 3   No current facility-administered medications for this visit.    Allergies:   Simvastatin   Social History:  The patient  reports that she quit smoking about 18 years ago. Her smoking use included Cigarettes. She has a 10 pack-year smoking history. She does not have any smokeless tobacco history on file. She reports that she does not drink  alcohol or use illicit drugs.   Family History:  The patient's  family history includes Alcohol abuse in her brother and father; Brain cancer in her sister; Breast cancer in her mother and sister; Cancer in her brother; Coronary artery disease in an other family member; Diabetes in her mother; Heart attack in her brother and brother; Heart disease in her brother, brother, and mother; Hypertension in an other family member.    ROS:  Please see the history of present illness.   All other systems are reviewed and negative.    PHYSICAL EXAM: VS:  BP 126/72 mmHg  Pulse 94  Ht  (1.651 m)  Wt 205 lb 3.2 oz (93.078 kg)  BMI 34.15 kg/m2 , BMI Body mass index is 34.15 kg/(m^2). GEN: Well nourished, well developed, in no acute distress HEENT: normal Neck: no JVD, carotid bruits, or masses Cardiac: iRRR; no murmurs, rubs, or gallops,no edema  Respiratory:  clear to auscultation bilaterally, normal work of breathing GI: soft, nontender, nondistended, + BS MS: no deformity or atrophy Skin: warm and dry  Neuro:  Strength and sensation are intact Psych: euthymic mood, full affect  EKG:  EKG is ordered today. The ekg ordered today shows atypical atrial flutter  ILR is reviewed and reveals 6% AF (down from 56% preablation)   Recent Labs: 02/22/2014: ALT 15 10/04/2014: Hemoglobin 14.2; Platelets 298.0 11/15/2014: BUN 7; Creatinine, Ser 0.65; Potassium 4.5; Sodium 138 11/29/2014: Magnesium 1.9    Lipid Panel     Component Value Date/Time   CHOL 171 02/22/2014 1054   TRIG 173.0* 02/22/2014 1054   HDL 40.20 02/22/2014 1054   CHOLHDL 4 02/22/2014 1054   VLDL 34.6 02/22/2014 1054   LDLCALC 96 02/22/2014 1054     Wt Readings from Last 3 Encounters:  01/17/15 205 lb 3.2 oz (93.078 kg)  11/15/14 198 lb 12.8 oz (90.175 kg)  10/29/14 197 lb (89.359 kg)     ASSESSMENT AND PLAN:  1.  Atypical atrial flutter She is symptomatic with this She has had multiple atypical atrial flutter  circuits at recent ablation.  I am concerned that she is at risks for additional atrial flutters in the future.  Will proceed with cardioversion.  Risks of procedure discussed with patient.  She has been therapeutic on coumadin. Continue tikosyn. Would be reluctant to consider amiodarone given restrictive lung disease, but could reassess with PFTs if needed   2. Atrial fibrillation Much improved post ablation I am optimistic Continue coumadin Chads2vasc score is at least 7.    3. Hypertensive cardiovascular disease Stable No change required today  4. Chronic systolic dysfunction euvolemic today Could consider CRT-P and AV nodal ablation down the road if needed.  For now would prefer to avoid devices  if possible Continue medical therapy  5. Restrictive lung disease As above  6. Post termination pauses/ dizziness Improved Followed with ILR   Follow-up in the AF clinic in 4 weeks I will see again in 3 months  Current medicines are reviewed at length with the patient today.   The patient does not have concerns regarding her medicines.  The following changes were made today:  none    Signed, Hillis Range, MD  01/17/2015 10:26 AM     [facility] [address] [place] [address] [phone] (office) [phone] (fax)

## 2015-02-03 NOTE — Interval H&P Note (Signed)
History and Physical Interval Note:  02/03/2015 11:09 AM  [name]  has presented today for surgery, with the diagnosis of AFLUTTER  The various methods of treatment have been discussed with the patient and family. After consideration of risks, benefits and other options for treatment, the patient has consented to  Procedure(s): CARDIOVERSION (N/A) as a surgical intervention .  The patient's history has been reviewed, patient examined, no change in status, stable for surgery.  I have reviewed the patient's chart and labs.  Questions were answered to the patient's satisfaction.     Lars Masson

## 2015-02-03 NOTE — CV Procedure (Signed)
    Cardioversion Note  [name] [id number] 07/16/1929  Procedure: DC Cardioversion Indications: atrial fibrillation  Procedure Details Consent: Obtained Time Out: Verified patient identification, verified procedure, site/side was marked, verified correct patient position, special equipment/implants available, Radiology Safety Procedures followed,  medications/allergies/relevent history reviewed, required imaging and test results available.  Performed  The patient has been on adequate anticoagulation.  The patient received IV propofol administered by anesthesia staff for sedation.  Synchronous cardioversion was performed at 120 joules.  The cardioversion was successful.   Complications: No apparent complications Patient did tolerate procedure well.   Lars Masson, MD, [facility] 02/03/2015, 11:10 AM

## 2015-02-04 ENCOUNTER — Encounter (HOSPITAL_COMMUNITY): Payer: Self-pay | Admitting: Cardiology

## 2015-02-10 ENCOUNTER — Encounter: Payer: Self-pay | Admitting: Cardiology

## 2015-02-10 ENCOUNTER — Ambulatory Visit (INDEPENDENT_AMBULATORY_CARE_PROVIDER_SITE_OTHER): Payer: PPO

## 2015-02-10 DIAGNOSIS — I4891 Unspecified atrial fibrillation: Secondary | ICD-10-CM | POA: Diagnosis not present

## 2015-02-10 DIAGNOSIS — Z7901 Long term (current) use of anticoagulants: Secondary | ICD-10-CM | POA: Diagnosis not present

## 2015-02-10 DIAGNOSIS — I481 Persistent atrial fibrillation: Secondary | ICD-10-CM | POA: Diagnosis not present

## 2015-02-10 DIAGNOSIS — Z5181 Encounter for therapeutic drug level monitoring: Secondary | ICD-10-CM

## 2015-02-10 DIAGNOSIS — I4819 Other persistent atrial fibrillation: Secondary | ICD-10-CM

## 2015-02-10 LAB — POCT INR: INR: 2.4

## 2015-02-16 ENCOUNTER — Ambulatory Visit (INDEPENDENT_AMBULATORY_CARE_PROVIDER_SITE_OTHER): Payer: PPO | Admitting: *Deleted

## 2015-02-16 DIAGNOSIS — R55 Syncope and collapse: Secondary | ICD-10-CM

## 2015-02-17 ENCOUNTER — Encounter: Payer: Self-pay | Admitting: Cardiology

## 2015-02-17 NOTE — Progress Notes (Signed)
Loop recorder 

## 2015-02-25 ENCOUNTER — Encounter: Payer: Self-pay | Admitting: Cardiology

## 2015-02-28 ENCOUNTER — Encounter (HOSPITAL_COMMUNITY): Payer: Self-pay | Admitting: Nurse Practitioner

## 2015-02-28 ENCOUNTER — Ambulatory Visit (HOSPITAL_COMMUNITY)
Admission: RE | Admit: 2015-02-28 | Discharge: 2015-02-28 | Disposition: A | Discharge status: Home / Self Care | Payer: PPO | Source: Ambulatory Visit | Attending: Nurse Practitioner | Admitting: Nurse Practitioner

## 2015-02-28 VITALS — BP 106/70 | HR 102 | Ht 65.0 in | Wt 204.4 lb

## 2015-02-28 DIAGNOSIS — I481 Persistent atrial fibrillation: Secondary | ICD-10-CM | POA: Insufficient documentation

## 2015-02-28 DIAGNOSIS — I4819 Other persistent atrial fibrillation: Secondary | ICD-10-CM

## 2015-02-28 MED ORDER — NITROGLYCERIN 0.4 MG SL SUBL: 0.4000 mg | SUBLINGUAL_TABLET | SUBLINGUAL | Status: DC | PRN

## 2015-02-28 NOTE — Progress Notes (Addendum)
Patient ID: [name], female   DOB: 1929-09-15, 81 y.o.   MRN: [id number]     Primary Care Physician: [name], MD Referring Physician: Dr. Mia Creek is a 81 y.o. female with a h/o persistent afib, anticoagulated with coumadin,with LINQ, post termination pauses up to 5 seconds. She had failed tikosyn therapy,device shows afib burden  56.8%. Not a good candidate for amiodarone due to lung disease. Decision was made to pursue PVI, which she did 6/16. She  did well and denied any rt groin pain or swallowing difficulties. She was found to be in atypical aflutter when seen by Dr. Johney Frame in September and underwent successful cardioversion. Today,ekg shows sinus tachycardia with first degree AV block vrs atypical flutter at 102 bpm. Pt feels ok but did admit to forgetting her tikosyn and BB Saturday pm and being aware of mild chest discomfort Sunday that did not prevent her from doing her usual activities and did not worsen with exertion. It is not present today. Dr. Jenel Lucks note mentioned if she continued to have arrhythmia, she may have to be taken off tikosyn and started on amiodarone, although she did have questionable PFT's on amiodarone and was the reason it was discontinued..  Today, she denies symptoms of palpitations, chest pain, shortness of breath, orthopnea, PND, lower extremity edema, dizziness, presyncope, syncope, or neurologic sequela. The patient is tolerating medications without difficulties and is otherwise without complaint today.   Past Medical History  Diagnosis Date  . DIABETES MELLITUS 09/24/2008  . HYPERLIPIDEMIA 09/24/2008  . HYPERTENSION 09/24/2008  . MI 09/24/2008  . CAD 09/24/2008    [facility]I in 1998 tx with PCI to LAD;  myoview 1/12:  no ischemia, EF 45%  . CARDIOMYOPATHY 09/24/2008    ischemic;  echo 4/12:  EF 20-25%, mild MR, mod LAE, mod reduced RVSF, mod RVE, mild RAE, mild TR, PASP 40  . Persistent atrial fibrillation (HCC) 09/24/2008    s/p DCCV x 4 in  past;  Amiod d/c'd 2/2 abnormal PFTs;  Tikosyn load 6/12 with DCCV  . Edema 09/24/2008  . CAROTID BRUIT 09/24/2008  . Anemia   . GERD (gastroesophageal reflux disease)   . Osteopenia   . Tachycardia-bradycardia [facility])    Past Surgical History  Procedure Laterality Date  . Hip fracture surgery  2006  . Knee surgery  2008    broken knee  . Loop recorder implant N/A 11/06/2012    Procedure: LOOP RECORDER IMPLANT;  Surgeon: Hillis Range, MD;  Location: [facility] CATH LAB;  Service: Cardiovascular;  Laterality: N/A;Medtronic LinQ implanted by Dr Johney Frame for palpitaitons and dizziness  . Tee without cardioversion N/A 10/15/2014    Procedure: TRANSESOPHAGEAL ECHOCARDIOGRAM (TEE);  Surgeon: Vesta Mixer, MD;  Location: [facility] ENDOSCOPY;  Service: Cardiovascular;  Laterality: N/A;  . Electrophysiologic study N/A 10/15/2014    PVI and CTI ablation by Dr Johney Frame  . Cardioversion N/A 02/03/2015    Procedure: CARDIOVERSION;  Surgeon: Lars Masson, MD;  Location: [facility] ENDOSCOPY;  Service: Cardiovascular;  Laterality: N/A;    Current Outpatient Prescriptions  Medication Sig Dispense Refill  . aspirin 81 MG EC tablet Take 81 mg by mouth daily.      Marland Kitchen atorvastatin (LIPITOR) 10 MG tablet Take 0.5 tablets (5 mg total) by mouth daily. 45 tablet 1  . Calcium Citrate-Vitamin D (CALCIUM + D PO) Take 1 tablet by mouth daily.    Marland Kitchen dofetilide (TIKOSYN) 250 MCG capsule Take 1 capsule (250 mcg total) by  mouth 2 (two) times daily. 180 capsule 1  . furosemide (LASIX) 40 MG tablet Take 1 tablet (40 mg total) by mouth daily as needed for fluid. 90 tablet 1  . Magnesium Oxide 400 MG CAPS Take 1 capsule (400 mg total) by mouth daily.  0  . metoprolol (LOPRESSOR) 100 MG tablet Take 0.5 tablets (50 mg total) by mouth 2 (two) times daily. 180 tablet 0  . Multiple Vitamin (MULTIVITAMIN PO) Take 1 tablet by mouth daily.      . nitroGLYCERIN (NITROSTAT) 0.4 MG SL tablet Place 1 tablet (0.4 mg total) under the tongue every 5 (five)  minutes x 3 doses as needed for chest pain. 30 tablet 3  . pantoprazole (PROTONIX) 40 MG tablet Take 1 tablet (40 mg total) by mouth daily. 45 tablet 0  . potassium chloride (MICRO-K) 10 MEQ CR capsule Take 1 capsule (10 mEq total) by mouth daily as needed (for potassium repacement when taking lasix). 90 capsule 1  . ramipril (ALTACE) 10 MG capsule Take 2 capsules (20 mg total) by mouth daily. 180 capsule 1  . vitamin E 100 UNIT capsule Take 100 Units by mouth daily.      Marland Kitchen warfarin (COUMADIN) 2.5 MG tablet Take as directed by coumadin clinic (Patient taking differently: Take 1.25-2.5 mg by mouth daily. Take 2.5 mg everyday expect for Tuesday take 1.25 mg) 105 tablet 3   No current facility-administered medications for this encounter.    Allergies  Allergen Reactions  . Simvastatin Rash    Social History   Social History  . Marital Status: Widowed    Spouse Name: N/A  . Number of Children: N/A  . Years of Education: N/A   Occupational History  . Not on file.   Social History Main Topics  . Smoking status: Former Smoker -- 0.50 packs/day for 20 years    Types: Cigarettes    Quit date: 07/11/1996  . Smokeless tobacco: Not on file  . Alcohol Use: No  . Drug Use: No  . Sexual Activity: Not on file   Other Topics Concern  . Not on file   Social History Narrative    Family History  Problem Relation Age of Onset  . Breast cancer Mother   . Heart disease Mother   . Diabetes Mother   . Alcohol abuse Father   . Breast cancer Sister   . Brain cancer Sister     cause of death at 35 years old  . Alcohol abuse Brother   . Heart disease Brother   . Coronary artery disease    . Hypertension    . Heart disease Brother   . Cancer Brother     unknown type  . Heart attack Brother   . Heart attack Brother     ROS- All systems are reviewed and negative except as per the HPI above  Physical Exam: Filed Vitals:   02/28/15 0849  BP: 106/70  Pulse: 102  Height:  (1.651  m)  Weight: 204 lb 6.4 oz (92.715 kg)    GEN- The patient is well appearing, alert and oriented x 3 today.   Head- normocephalic, atraumatic Eyes-  Sclera clear, conjunctiva pink Ears- hearing intact Oropharynx- clear Neck- supple, no JVP Lymph- no cervical lymphadenopathy Lungs- Clear to ausculation bilaterally, normal work of breathing Heart- Regular rate and rhythm, no murmurs, rubs or gallops, PMI not laterally displaced GI- soft, NT, ND, + BS Extremities- no clubbing, cyanosis, or edema MS- no significant deformity  or atrophy Skin- no rash or lesion Psych- euthymic mood, full affect Neuro- strength and sensation are intact  EKG- Atrial tach vrs, vrs atypical aflutter, pr int 236 ms, qrs 118 ms, qtc 427 ms. Epic records   Assessment and Plan:  1. Persistent afib/flutter s/p ablation 6/24 S/p successful cardioversion 10/13 but not  sure pt is in SR today Will review EKG with Dr. Johney Frame and if any needed change in treatment One 5 second pause for which pt was asymptomatic Continue tikosyn, bmet/mag today. Continue coumadin for chadsvasc score of 7  2. Post termination pauses Continue to monitor via Linq Currently asymptomatic  F/u Dr. Johney Frame 1/4 as scheduled. F/u afib clinic as needed per review of current EKG  Lupita Leash [facility] [address] [place], [place] [address] [phone]  Addendum: EKG reviewed with Dr. Johney Frame who agreed with the above interpretation. The pt was asymptomatic and she just had an cardioversion with ERAF. He said for now he would not change treatment. I will f/u with her in one month.

## 2015-03-01 ENCOUNTER — Ambulatory Visit: Payer: PPO

## 2015-03-01 NOTE — Addendum Note (Signed)
Encounter addended by: Newman Nip, NP on: 03/01/2015  4:30 PM<BR>     Documentation filed: Notes Section

## 2015-03-03 ENCOUNTER — Encounter: Payer: Self-pay | Admitting: Cardiology

## 2015-03-03 ENCOUNTER — Ambulatory Visit (INDEPENDENT_AMBULATORY_CARE_PROVIDER_SITE_OTHER): Payer: PPO

## 2015-03-03 DIAGNOSIS — Z23 Encounter for immunization: Secondary | ICD-10-CM

## 2015-03-10 ENCOUNTER — Encounter: Payer: Self-pay | Admitting: Cardiology

## 2015-03-10 ENCOUNTER — Ambulatory Visit (INDEPENDENT_AMBULATORY_CARE_PROVIDER_SITE_OTHER): Payer: PPO

## 2015-03-10 DIAGNOSIS — Z7901 Long term (current) use of anticoagulants: Secondary | ICD-10-CM

## 2015-03-10 DIAGNOSIS — Z5181 Encounter for therapeutic drug level monitoring: Secondary | ICD-10-CM

## 2015-03-10 DIAGNOSIS — I481 Persistent atrial fibrillation: Secondary | ICD-10-CM

## 2015-03-10 DIAGNOSIS — I4819 Other persistent atrial fibrillation: Secondary | ICD-10-CM

## 2015-03-10 DIAGNOSIS — I4891 Unspecified atrial fibrillation: Secondary | ICD-10-CM | POA: Diagnosis not present

## 2015-03-10 LAB — POCT INR: INR: 2.5

## 2015-03-18 LAB — CUP PACEART REMOTE DEVICE CHECK: MDC IDC SESS DTM: 20161026213833

## 2015-03-18 NOTE — Progress Notes (Signed)
[facility] summary report received. Battery status OK. Normal device function. No new symptom episodes, tachy episodes, brady, or pause episodes. 23 AF episodes (burden 3.0%), +warfarin, avg V rate moderately controlled. Monthly summary reports, ROV with AF clinic on 03/31/15 and with JA on 04/27/15.

## 2015-03-21 ENCOUNTER — Ambulatory Visit (INDEPENDENT_AMBULATORY_CARE_PROVIDER_SITE_OTHER): Payer: PPO | Admitting: *Deleted

## 2015-03-21 DIAGNOSIS — R55 Syncope and collapse: Secondary | ICD-10-CM

## 2015-03-23 NOTE — Progress Notes (Signed)
LOOP RECORDER  

## 2015-03-25 ENCOUNTER — Other Ambulatory Visit (HOSPITAL_COMMUNITY): Payer: Self-pay | Admitting: *Deleted

## 2015-03-25 MED ORDER — DOFETILIDE 250 MCG PO CAPS: 250.0000 ug | ORAL_CAPSULE | Freq: Two times a day (BID) | ORAL | Status: DC

## 2015-03-31 ENCOUNTER — Ambulatory Visit (HOSPITAL_COMMUNITY)
Admission: RE | Admit: 2015-03-31 | Discharge: 2015-03-31 | Disposition: A | Discharge status: Home / Self Care | Payer: PPO | Source: Ambulatory Visit | Attending: Nurse Practitioner | Admitting: Nurse Practitioner

## 2015-03-31 ENCOUNTER — Encounter (HOSPITAL_COMMUNITY): Payer: Self-pay | Admitting: Nurse Practitioner

## 2015-03-31 VITALS — BP 110/66 | HR 91 | Ht 65.0 in | Wt 205.2 lb

## 2015-03-31 DIAGNOSIS — I252 Old myocardial infarction: Secondary | ICD-10-CM | POA: Insufficient documentation

## 2015-03-31 DIAGNOSIS — Z7901 Long term (current) use of anticoagulants: Secondary | ICD-10-CM | POA: Insufficient documentation

## 2015-03-31 DIAGNOSIS — E119 Type 2 diabetes mellitus without complications: Secondary | ICD-10-CM | POA: Insufficient documentation

## 2015-03-31 DIAGNOSIS — I10 Essential (primary) hypertension: Secondary | ICD-10-CM | POA: Insufficient documentation

## 2015-03-31 DIAGNOSIS — I4892 Unspecified atrial flutter: Secondary | ICD-10-CM | POA: Diagnosis not present

## 2015-03-31 DIAGNOSIS — I4819 Other persistent atrial fibrillation: Secondary | ICD-10-CM

## 2015-03-31 DIAGNOSIS — I2510 Atherosclerotic heart disease of native coronary artery without angina pectoris: Secondary | ICD-10-CM | POA: Insufficient documentation

## 2015-03-31 DIAGNOSIS — Z8249 Family history of ischemic heart disease and other diseases of the circulatory system: Secondary | ICD-10-CM | POA: Diagnosis not present

## 2015-03-31 DIAGNOSIS — Z87891 Personal history of nicotine dependence: Secondary | ICD-10-CM | POA: Insufficient documentation

## 2015-03-31 DIAGNOSIS — E785 Hyperlipidemia, unspecified: Secondary | ICD-10-CM | POA: Insufficient documentation

## 2015-03-31 DIAGNOSIS — Z833 Family history of diabetes mellitus: Secondary | ICD-10-CM | POA: Insufficient documentation

## 2015-03-31 DIAGNOSIS — K219 Gastro-esophageal reflux disease without esophagitis: Secondary | ICD-10-CM | POA: Diagnosis not present

## 2015-03-31 DIAGNOSIS — Z79899 Other long term (current) drug therapy: Secondary | ICD-10-CM | POA: Diagnosis not present

## 2015-03-31 DIAGNOSIS — I481 Persistent atrial fibrillation: Secondary | ICD-10-CM | POA: Insufficient documentation

## 2015-03-31 NOTE — Progress Notes (Signed)
Patient ID: [name], female   DOB: 1930-03-01, 81 y.o.   MRN: [id number]     Primary Care Physician: [name], MD Referring Physician: Dr. Mia Creek is a 81 y.o. female with a h/o persistent afib, anticoagulated with coumadin,with LINQ, post termination pauses up to 5 seconds. She had failed tikosyn therapy,device shows afib burden  56.8%.Decision was made to pursue PVI, which she did 6/16. She  did well and denied any rt groin pain or swallowing difficulties. She was found to be in atypical aflutter when seen by Dr. Johney Frame in September and underwent successful cardioversion. On last visit, ekg shows sinus tachycardia with first degree AV block vrs atypical flutter at 102 bpm. Reviewed with Dr. Johney Frame and he did not recommend to change therapy  Dr. Jenel Lucks note mentioned if she continued to have arrhythmia, she may have to be taken off tikosyn and started on amiodarone, although she did have questionable PFT's on amiodarone and was the reason it was discontinued.  On return visit, 12/8, EKG continues to show aflutter with controlled rate. Pt states she feels well but daughter thinks she is a little more winded with activity. She continues on tikosyn.  Today, she denies symptoms of palpitations, chest pain, shortness of breath, orthopnea, PND, lower extremity edema, dizziness, presyncope, syncope, or neurologic sequela. The patient is tolerating medications without difficulties and is otherwise without complaint today.   Past Medical History  Diagnosis Date  . DIABETES MELLITUS 09/24/2008  . HYPERLIPIDEMIA 09/24/2008  . HYPERTENSION 09/24/2008  . MI 09/24/2008  . CAD 09/24/2008    AMI in 1998 tx with PCI to LAD;  myoview 1/12:  no ischemia, EF 45%  . CARDIOMYOPATHY 09/24/2008    ischemic;  echo 4/12:  EF 20-25%, mild MR, mod LAE, mod reduced RVSF, mod RVE, mild RAE, mild TR, PASP 40  . Persistent atrial fibrillation (HCC) 09/24/2008    s/p DCCV x 4 in past;  Amiod d/c'd 2/2  abnormal PFTs;  Tikosyn load 6/12 with DCCV  . Edema 09/24/2008  . CAROTID BRUIT 09/24/2008  . Anemia   . GERD (gastroesophageal reflux disease)   . Osteopenia   . Tachycardia-bradycardia [facility])    Past Surgical History  Procedure Laterality Date  . Hip fracture surgery  2006  . Knee surgery  2008    broken knee  . Loop recorder implant N/A 11/06/2012    Procedure: LOOP RECORDER IMPLANT;  Surgeon: Hillis Range, MD;  Location: [facility] CATH LAB;  Service: Cardiovascular;  Laterality: N/A;Medtronic LinQ implanted by Dr Johney Frame for palpitaitons and dizziness  . Tee without cardioversion N/A 10/15/2014    Procedure: TRANSESOPHAGEAL ECHOCARDIOGRAM (TEE);  Surgeon: Vesta Mixer, MD;  Location: [facility] ENDOSCOPY;  Service: Cardiovascular;  Laterality: N/A;  . Electrophysiologic study N/A 10/15/2014    PVI and CTI ablation by Dr Johney Frame  . Cardioversion N/A 02/03/2015    Procedure: CARDIOVERSION;  Surgeon: Lars Masson, MD;  Location: [facility] ENDOSCOPY;  Service: Cardiovascular;  Laterality: N/A;    Current Outpatient Prescriptions  Medication Sig Dispense Refill  . aspirin 81 MG EC tablet Take 81 mg by mouth daily.      Marland Kitchen atorvastatin (LIPITOR) 10 MG tablet Take 0.5 tablets (5 mg total) by mouth daily. 45 tablet 1  . Calcium Citrate-Vitamin D (CALCIUM + D PO) Take 1 tablet by mouth daily.    Marland Kitchen dofetilide (TIKOSYN) 250 MCG capsule Take 1 capsule (250 mcg total) by mouth 2 (two) times daily.  180 capsule 3  . furosemide (LASIX) 40 MG tablet Take 1 tablet (40 mg total) by mouth daily as needed for fluid. 90 tablet 1  . Magnesium Oxide 400 MG CAPS Take 1 capsule (400 mg total) by mouth daily.  0  . metoprolol (LOPRESSOR) 100 MG tablet Take 0.5 tablets (50 mg total) by mouth 2 (two) times daily. 180 tablet 0  . Multiple Vitamin (MULTIVITAMIN PO) Take 1 tablet by mouth daily.      . nitroGLYCERIN (NITROSTAT) 0.4 MG SL tablet Place 1 tablet (0.4 mg total) under the tongue every 5 (five) minutes x 3 doses as needed  for chest pain. 30 tablet 3  . pantoprazole (PROTONIX) 40 MG tablet Take 1 tablet (40 mg total) by mouth daily. 45 tablet 0  . potassium chloride (MICRO-K) 10 MEQ CR capsule Take 1 capsule (10 mEq total) by mouth daily as needed (for potassium repacement when taking lasix). 90 capsule 1  . ramipril (ALTACE) 10 MG capsule Take 2 capsules (20 mg total) by mouth daily. 180 capsule 1  . vitamin E 100 UNIT capsule Take 100 Units by mouth daily.      Marland Kitchen warfarin (COUMADIN) 2.5 MG tablet Take as directed by coumadin clinic (Patient taking differently: Take 1.25-2.5 mg by mouth daily. Take 2.5 mg everyday expect for Tuesday take 1.25 mg) 105 tablet 3   No current facility-administered medications for this encounter.    Allergies  Allergen Reactions  . Simvastatin Rash    Social History   Social History  . Marital Status: Widowed    Spouse Name: N/A  . Number of Children: N/A  . Years of Education: N/A   Occupational History  . Not on file.   Social History Main Topics  . Smoking status: Former Smoker -- 0.50 packs/day for 20 years    Types: Cigarettes    Quit date: 07/11/1996  . Smokeless tobacco: Not on file  . Alcohol Use: No  . Drug Use: No  . Sexual Activity: Not on file   Other Topics Concern  . Not on file   Social History Narrative    Family History  Problem Relation Age of Onset  . Breast cancer Mother   . Heart disease Mother   . Diabetes Mother   . Alcohol abuse Father   . Breast cancer Sister   . Brain cancer Sister     cause of death at 32 years old  . Alcohol abuse Brother   . Heart disease Brother   . Coronary artery disease    . Hypertension    . Heart disease Brother   . Cancer Brother     unknown type  . Heart attack Brother   . Heart attack Brother     ROS- All systems are reviewed and negative except as per the HPI above  Physical Exam: Filed Vitals:   03/31/15 0951  BP: 110/66  Pulse: 91  Height:  (1.651 m)  Weight: 205 lb 3.2 oz  (93.078 kg)    GEN- The patient is well appearing, alert and oriented x 3 today.   Head- normocephalic, atraumatic Eyes-  Sclera clear, conjunctiva pink Ears- hearing intact Oropharynx- clear Neck- supple, no JVP Lymph- no cervical lymphadenopathy Lungs- Clear to ausculation bilaterally, normal work of breathing Heart- Regular rate and rhythm, no murmurs, rubs or gallops, PMI not laterally displaced GI- soft, NT, ND, + BS Extremities- no clubbing, cyanosis, or edema MS- no significant deformity or atrophy Skin- no rash  or lesion Psych- euthymic mood, full affect Neuro- strength and sensation are intact  EKG- A flutter with variable AV block with PVC's vrs aberrant PVC's, at 91 bpm. QRS int 100 ms, Qtc 442 ms. Epic records reviewed  Assessment and Plan:  1. Persistent afib/flutter s/p ablation 6/24 S/p successful cardioversion 10/13 but not  maintaining SR, minimally symptomatic. Continue tikosyn  Continue coumadin for chadsvasc score of 7  2. Post termination pauses Continue to monitor via Linq Currently asymptomatic  F/u Dr. Johney Frame 1/4 as scheduled. F/u afib clinic as needed   Elvina Sidle. [facility] [address] [place], [place] [address] [phone]

## 2015-04-07 ENCOUNTER — Ambulatory Visit (INDEPENDENT_AMBULATORY_CARE_PROVIDER_SITE_OTHER): Payer: PPO | Admitting: Surgery

## 2015-04-07 DIAGNOSIS — I4891 Unspecified atrial fibrillation: Secondary | ICD-10-CM | POA: Diagnosis not present

## 2015-04-07 DIAGNOSIS — Z5181 Encounter for therapeutic drug level monitoring: Secondary | ICD-10-CM | POA: Diagnosis not present

## 2015-04-07 DIAGNOSIS — I481 Persistent atrial fibrillation: Secondary | ICD-10-CM | POA: Diagnosis not present

## 2015-04-07 DIAGNOSIS — I4819 Other persistent atrial fibrillation: Secondary | ICD-10-CM

## 2015-04-07 DIAGNOSIS — Z7901 Long term (current) use of anticoagulants: Secondary | ICD-10-CM

## 2015-04-07 LAB — POCT INR: INR: 2.7

## 2015-04-19 ENCOUNTER — Ambulatory Visit (INDEPENDENT_AMBULATORY_CARE_PROVIDER_SITE_OTHER): Payer: PPO | Admitting: *Deleted

## 2015-04-19 DIAGNOSIS — R55 Syncope and collapse: Secondary | ICD-10-CM

## 2015-04-19 NOTE — Progress Notes (Signed)
Carelink Summary Report / Loop Recorder 

## 2015-04-27 ENCOUNTER — Encounter: Payer: Self-pay | Admitting: Internal Medicine

## 2015-04-27 ENCOUNTER — Ambulatory Visit (INDEPENDENT_AMBULATORY_CARE_PROVIDER_SITE_OTHER): Payer: PPO | Admitting: Internal Medicine

## 2015-04-27 VITALS — BP 122/68 | HR 63 | Ht 65.0 in | Wt 206.8 lb

## 2015-04-27 DIAGNOSIS — I481 Persistent atrial fibrillation: Secondary | ICD-10-CM

## 2015-04-27 DIAGNOSIS — I5022 Chronic systolic (congestive) heart failure: Secondary | ICD-10-CM | POA: Diagnosis not present

## 2015-04-27 DIAGNOSIS — I484 Atypical atrial flutter: Secondary | ICD-10-CM | POA: Diagnosis not present

## 2015-04-27 DIAGNOSIS — I4819 Other persistent atrial fibrillation: Secondary | ICD-10-CM

## 2015-04-27 DIAGNOSIS — I480 Paroxysmal atrial fibrillation: Secondary | ICD-10-CM

## 2015-04-27 LAB — CUP PACEART INCLINIC DEVICE CHECK: MDC IDC SESS DTM: 20170104094110

## 2015-04-27 LAB — BASIC METABOLIC PANEL
BUN: 13 mg/dL (ref 7–25)
CALCIUM: 9.4 mg/dL (ref 8.6–10.4)
CHLORIDE: 99 mmol/L (ref 98–110)
CO2: 27 mmol/L (ref 20–31)
CREATININE: 0.69 mg/dL (ref 0.60–0.88)
Glucose, Bld: 81 mg/dL (ref 65–99)
Potassium: 4.7 mmol/L (ref 3.5–5.3)
Sodium: 135 mmol/L (ref 135–146)

## 2015-04-27 LAB — MAGNESIUM: Magnesium: 2.1 mg/dL (ref 1.5–2.5)

## 2015-04-27 MED ORDER — BENZONATATE 100 MG PO CAPS: 100.0000 mg | ORAL_CAPSULE | Freq: Three times a day (TID) | ORAL | Status: DC | PRN

## 2015-04-27 NOTE — Progress Notes (Signed)
Electrophysiology Office Note   Date:  04/27/2015   ID:  [name], DOB [birth year], MRN [id number]  PCP:  [name], MD  Cardiologist:  Dr Eden Emms Primary Electrophysiologist: Hillis Range, MD    Chief Complaint  Patient presents with  . Persistent AFIB  . Atypical atrial flutter     History of Present Illness: [name] is a 82 y.o. female who presents today for electrophysiology evaluation.   She is doing very well.  Her dizziness is resolved.  Very rare palpitations.  Today, she denies symptoms of chest pain, shortness of breath, orthopnea, PND, lower extremity edema, claudication, presyncope, syncope, bleeding, or neurologic sequela. The patient is tolerating medications without difficulties and is otherwise without complaint today.    Past Medical History  Diagnosis Date  . DIABETES MELLITUS 09/24/2008  . HYPERLIPIDEMIA 09/24/2008  . HYPERTENSION 09/24/2008  . MI 09/24/2008  . CAD 09/24/2008    AMI in 1998 tx with PCI to LAD;  myoview 1/12:  no ischemia, EF 45%  . CARDIOMYOPATHY 09/24/2008    ischemic;  echo 4/12:  EF 20-25%, mild MR, mod LAE, mod reduced RVSF, mod RVE, mild RAE, mild TR, PASP 40  . Persistent atrial fibrillation (HCC) 09/24/2008    s/p DCCV x 4 in past;  Amiod d/c'd 2/2 abnormal PFTs;  Tikosyn load 6/12 with DCCV  . Edema 09/24/2008  . CAROTID BRUIT 09/24/2008  . Anemia   . GERD (gastroesophageal reflux disease)   . Osteopenia   . Tachycardia-bradycardia [facility])    Past Surgical History  Procedure Laterality Date  . Hip fracture surgery  2006  . Knee surgery  2008    broken knee  . Loop recorder implant N/A 11/06/2012    Procedure: LOOP RECORDER IMPLANT;  Surgeon: Hillis Range, MD;  Location: [facility] CATH LAB;  Service: Cardiovascular;  Laterality: N/A;Medtronic LinQ implanted by Dr Johney Frame for palpitaitons and dizziness  . Tee without cardioversion N/A 10/15/2014    Procedure: TRANSESOPHAGEAL ECHOCARDIOGRAM (TEE);  Surgeon: Vesta Mixer, MD;  Location:  [facility] ENDOSCOPY;  Service: Cardiovascular;  Laterality: N/A;  . Electrophysiologic study N/A 10/15/2014    PVI and CTI ablation by Dr Johney Frame  . Cardioversion N/A 02/03/2015    Procedure: CARDIOVERSION;  Surgeon: Lars Masson, MD;  Location: [facility] ENDOSCOPY;  Service: Cardiovascular;  Laterality: N/A;     Current Outpatient Prescriptions  Medication Sig Dispense Refill  . aspirin 81 MG EC tablet Take 81 mg by mouth daily.      Marland Kitchen atorvastatin (LIPITOR) 10 MG tablet Take 0.5 tablets (5 mg total) by mouth daily. 45 tablet 1  . Calcium Citrate-Vitamin D (CALCIUM + D PO) Take 1 tablet by mouth daily.    Marland Kitchen dofetilide (TIKOSYN) 250 MCG capsule Take 1 capsule (250 mcg total) by mouth 2 (two) times daily. 180 capsule 3  . furosemide (LASIX) 40 MG tablet Take 1 tablet (40 mg total) by mouth daily as needed for fluid. 90 tablet 1  . Magnesium Oxide 400 MG CAPS Take 1 capsule (400 mg total) by mouth daily.  0  . metoprolol (LOPRESSOR) 100 MG tablet Take 0.5 tablets (50 mg total) by mouth 2 (two) times daily. 180 tablet 0  . Multiple Vitamin (MULTIVITAMIN PO) Take 1 tablet by mouth daily.      . nitroGLYCERIN (NITROSTAT) 0.4 MG SL tablet Place 1 tablet (0.4 mg total) under the tongue every 5 (five) minutes x 3 doses as needed for chest pain. 30 tablet 3  .  pantoprazole (PROTONIX) 40 MG tablet Take 1 tablet (40 mg total) by mouth daily. 45 tablet 0  . potassium chloride (MICRO-K) 10 MEQ CR capsule Take 1 capsule (10 mEq total) by mouth daily as needed (for potassium repacement when taking lasix). 90 capsule 1  . ramipril (ALTACE) 10 MG capsule Take 2 capsules (20 mg total) by mouth daily. 180 capsule 1  . vitamin E 100 UNIT capsule Take 100 Units by mouth daily.      Marland Kitchen warfarin (COUMADIN) 2.5 MG tablet Take as directed by coumadin clinic (Patient taking differently: Take 1.25-2.5 mg by mouth daily. Take 2.5 mg everyday expect for Tuesday take 1.25 mg) 105 tablet 3   No current facility-administered  medications for this visit.    Allergies:   Simvastatin   Social History:  The patient  reports that she quit smoking about 18 years ago. Her smoking use included Cigarettes. She has a 10 pack-year smoking history. She does not have any smokeless tobacco history on file. She reports that she does not drink alcohol or use illicit drugs.   Family History:  The patient's  family history includes Alcohol abuse in her brother and father; Brain cancer in her sister; Breast cancer in her mother and sister; Cancer in her brother; Diabetes in her mother; Heart attack in her brother and brother; Heart disease in her brother, brother, and mother.    ROS:  Please see the history of present illness.   All other systems are reviewed and negative.    PHYSICAL EXAM: VS:  BP 122/68 mmHg  Pulse 63  Ht 5\' 5"  (1.651 m)  Wt 206 lb 12.8 oz (93.804 kg)  BMI 34.41 kg/m2 , BMI Body mass index is 34.41 kg/(m^2). GEN: Well nourished, well developed, in no acute distress HEENT: normal Neck: no JVD, carotid bruits, or masses Cardiac: iRRR; no murmurs, rubs, or gallops,no edema  Respiratory:  clear to auscultation bilaterally, normal work of breathing GI: soft, nontender, nondistended, + BS MS: no deformity or atrophy Skin: warm and dry  Neuro:  Strength and sensation are intact Psych: euthymic mood, full affect  EKG:  EKG is ordered today. The ekg ordered today shows sinus rhythm, nonspecifivc ST/T chnages  ILR is reviewed and reveals 4% AF (down from 56% preablation)   Recent Labs: 11/29/2014: Magnesium 1.9 01/31/2015: BUN 12; Creat 0.81; Hemoglobin 13.7; Platelets 317; Potassium 4.5; Sodium 138    Lipid Panel     Component Value Date/Time   CHOL 171 02/22/2014 1054   TRIG 173.0* 02/22/2014 1054   HDL 40.20 02/22/2014 1054   CHOLHDL 4 02/22/2014 1054   VLDL 34.6 02/22/2014 1054   LDLCALC 96 02/22/2014 1054     Wt Readings from Last 3 Encounters:  04/27/15 206 lb 12.8 oz (93.804 kg)  03/31/15  205 lb 3.2 oz (93.078 kg)  02/28/15 204 lb 6.4 oz (92.715 kg)     ASSESSMENT AND PLAN:  1.  Atypical atrial flutter/ atrial fibrillation Doing very well at this time Check bmet, mg today Continue coumadin, Chads2vasc score is at least 7.    Would be reluctant to consider amiodarone given restrictive lung disease, but could reassess with PFTs if needed  2. Hypertensive cardiovascular disease Stable No change required today  3. Chronic systolic dysfunction euvolemic today Continue medical therapy  4. Restrictive lung disease As above  5. Post termination pauses/ dizziness Resolved s/p ablation Followed with ILR  I will see again in 6 months  Current medicines are reviewed  at length with the patient today.   The patient does not have concerns regarding her medicines.  The following changes were made today:  none    Signed, Hillis Range, MD  04/27/2015 9:38 AM     [facility] [address] [place] [address] [phone] (office) [phone] (fax)

## 2015-04-27 NOTE — Patient Instructions (Signed)
Medication Instructions:  Your physician recommends that you continue on your current medications as directed. Please refer to the Current Medication list given to you today.   Labwork: Your physician recommends that you return for lab work today: BMP/MAG   Testing/Procedures: None ordered   Follow-Up: Your physician wants you to follow-up in: 6 months with Dr Johney Frame Bonita Quin will receive a reminder letter in the mail two months in advance. If you don't receive a letter, please call our office to schedule the follow-up appointment.   Any Other Special Instructions Will Be Listed Below (If Applicable).     If you need a refill on your cardiac medications before your next appointment, please call your pharmacy.

## 2015-05-01 LAB — CUP PACEART REMOTE DEVICE CHECK: MDC IDC SESS DTM: 20161125213806

## 2015-05-17 ENCOUNTER — Ambulatory Visit (INDEPENDENT_AMBULATORY_CARE_PROVIDER_SITE_OTHER): Payer: PPO | Admitting: *Deleted

## 2015-05-17 DIAGNOSIS — R55 Syncope and collapse: Secondary | ICD-10-CM | POA: Diagnosis not present

## 2015-05-18 NOTE — Progress Notes (Signed)
Carelink Summary Report / Loop Recorder 

## 2015-05-20 ENCOUNTER — Ambulatory Visit (INDEPENDENT_AMBULATORY_CARE_PROVIDER_SITE_OTHER): Payer: PPO | Admitting: *Deleted

## 2015-05-20 DIAGNOSIS — I4891 Unspecified atrial fibrillation: Secondary | ICD-10-CM

## 2015-05-20 DIAGNOSIS — Z7901 Long term (current) use of anticoagulants: Secondary | ICD-10-CM

## 2015-05-20 DIAGNOSIS — I481 Persistent atrial fibrillation: Secondary | ICD-10-CM

## 2015-05-20 DIAGNOSIS — I4819 Other persistent atrial fibrillation: Secondary | ICD-10-CM

## 2015-05-20 DIAGNOSIS — Z5181 Encounter for therapeutic drug level monitoring: Secondary | ICD-10-CM

## 2015-05-20 LAB — POCT INR: INR: 2.9

## 2015-05-28 LAB — CUP PACEART REMOTE DEVICE CHECK: Date Time Interrogation Session: 20161225132009

## 2015-05-30 ENCOUNTER — Other Ambulatory Visit: Payer: Self-pay | Admitting: Internal Medicine

## 2015-06-16 ENCOUNTER — Ambulatory Visit (INDEPENDENT_AMBULATORY_CARE_PROVIDER_SITE_OTHER): Payer: PPO | Admitting: *Deleted

## 2015-06-16 DIAGNOSIS — R55 Syncope and collapse: Secondary | ICD-10-CM | POA: Diagnosis not present

## 2015-06-17 NOTE — Progress Notes (Signed)
Carelink Summary Report / Loop Recorder 

## 2015-07-01 ENCOUNTER — Ambulatory Visit (INDEPENDENT_AMBULATORY_CARE_PROVIDER_SITE_OTHER): Payer: PPO | Admitting: Surgery

## 2015-07-01 DIAGNOSIS — I481 Persistent atrial fibrillation: Secondary | ICD-10-CM

## 2015-07-01 DIAGNOSIS — I4891 Unspecified atrial fibrillation: Secondary | ICD-10-CM | POA: Diagnosis not present

## 2015-07-01 DIAGNOSIS — I4819 Other persistent atrial fibrillation: Secondary | ICD-10-CM

## 2015-07-01 DIAGNOSIS — Z7901 Long term (current) use of anticoagulants: Secondary | ICD-10-CM

## 2015-07-01 DIAGNOSIS — Z5181 Encounter for therapeutic drug level monitoring: Secondary | ICD-10-CM

## 2015-07-01 LAB — POCT INR: INR: 2.5

## 2015-07-03 LAB — CUP PACEART REMOTE DEVICE CHECK: MDC IDC SESS DTM: 20170124220835

## 2015-07-18 ENCOUNTER — Ambulatory Visit (INDEPENDENT_AMBULATORY_CARE_PROVIDER_SITE_OTHER): Payer: PPO | Admitting: *Deleted

## 2015-07-18 DIAGNOSIS — R55 Syncope and collapse: Secondary | ICD-10-CM

## 2015-07-18 NOTE — Progress Notes (Signed)
Carelink Summary Report / Loop Recorder 

## 2015-08-01 ENCOUNTER — Other Ambulatory Visit: Payer: Self-pay | Admitting: Internal Medicine

## 2015-08-12 ENCOUNTER — Ambulatory Visit (INDEPENDENT_AMBULATORY_CARE_PROVIDER_SITE_OTHER): Payer: PPO | Admitting: Surgery

## 2015-08-12 DIAGNOSIS — Z7901 Long term (current) use of anticoagulants: Secondary | ICD-10-CM | POA: Diagnosis not present

## 2015-08-12 DIAGNOSIS — Z5181 Encounter for therapeutic drug level monitoring: Secondary | ICD-10-CM

## 2015-08-12 DIAGNOSIS — I4891 Unspecified atrial fibrillation: Secondary | ICD-10-CM | POA: Diagnosis not present

## 2015-08-12 DIAGNOSIS — I481 Persistent atrial fibrillation: Secondary | ICD-10-CM | POA: Diagnosis not present

## 2015-08-12 DIAGNOSIS — I4819 Other persistent atrial fibrillation: Secondary | ICD-10-CM

## 2015-08-12 LAB — POCT INR: INR: 2.1

## 2015-08-15 ENCOUNTER — Ambulatory Visit (INDEPENDENT_AMBULATORY_CARE_PROVIDER_SITE_OTHER): Payer: PPO | Admitting: *Deleted

## 2015-08-15 DIAGNOSIS — R55 Syncope and collapse: Secondary | ICD-10-CM | POA: Diagnosis not present

## 2015-08-16 NOTE — Progress Notes (Signed)
Carelink Summary Report / Loop Recorder 

## 2015-09-02 LAB — CUP PACEART REMOTE DEVICE CHECK: Date Time Interrogation Session: 20170223220946

## 2015-09-02 NOTE — Progress Notes (Signed)
Carelink summary report received. Battery status OK. Normal device function. No new symptom episodes,  brady, or pause episodes. 4 tachy, 3 with available ECGs appear irregular VS ?AF with RVR. 26 AF 7.7 %  +Warfarin Monthly summary reports and ROV/PRN

## 2015-09-13 ENCOUNTER — Ambulatory Visit (INDEPENDENT_AMBULATORY_CARE_PROVIDER_SITE_OTHER): Payer: PPO | Admitting: Family Medicine

## 2015-09-13 VITALS — BP 120/80 | HR 65 | Temp 97.5°F | Ht 65.0 in | Wt 200.4 lb

## 2015-09-13 DIAGNOSIS — R059 Cough, unspecified: Secondary | ICD-10-CM

## 2015-09-13 DIAGNOSIS — R05 Cough: Secondary | ICD-10-CM | POA: Diagnosis not present

## 2015-09-13 MED ORDER — ACCU-CHEK SOFTCLIX LANCET DEV MISC: Status: DC

## 2015-09-13 MED ORDER — GLUCOSE BLOOD VI STRP: ORAL_STRIP | Status: DC

## 2015-09-13 MED ORDER — BENZONATATE 200 MG PO CAPS: 200.0000 mg | ORAL_CAPSULE | Freq: Three times a day (TID) | ORAL | Status: DC | PRN

## 2015-09-13 MED ORDER — CEFUROXIME AXETIL 500 MG PO TABS: 500.0000 mg | ORAL_TABLET | Freq: Two times a day (BID) | ORAL | Status: DC

## 2015-09-13 NOTE — Patient Instructions (Signed)
Follow up for any fever or increased shortness of breath. 

## 2015-09-13 NOTE — Progress Notes (Signed)
Subjective:    Patient ID: [name], female    DOB: [birth year], 82 y.o.   MRN: [id number]  HPI Patient here with cough over the past week. Onset initially of cough with laryngitis and sore throat. Possible low-grade fever initially though none for the past couple days. Increased fatigue. Cough productive of yellow to green sputum. Denies any dyspnea at rest.  She has history of atrial flutter and is on Tikosyn as well as Coumadin.  Past Medical History  Diagnosis Date  . DIABETES MELLITUS 09/24/2008  . HYPERLIPIDEMIA 09/24/2008  . HYPERTENSION 09/24/2008  . MI 09/24/2008  . CAD 09/24/2008    AMI in 1998 tx with PCI to LAD;  myoview 1/12:  no ischemia, EF 45%  . CARDIOMYOPATHY 09/24/2008    ischemic;  echo 4/12:  EF 20-25%, mild MR, mod LAE, mod reduced RVSF, mod RVE, mild RAE, mild TR, PASP 40  . Persistent atrial fibrillation (HCC) 09/24/2008    s/p DCCV x 4 in past;  Amiod d/c'd 2/2 abnormal PFTs;  Tikosyn load 6/12 with DCCV  . Edema 09/24/2008  . CAROTID BRUIT 09/24/2008  . Anemia   . GERD (gastroesophageal reflux disease)   . Osteopenia   . Tachycardia-bradycardia [facility])    Past Surgical History  Procedure Laterality Date  . Hip fracture surgery  2006  . Knee surgery  2008    broken knee  . Loop recorder implant N/A 11/06/2012    Procedure: LOOP RECORDER IMPLANT;  Surgeon: Hillis Range, MD;  Location: [facility] CATH LAB;  Service: Cardiovascular;  Laterality: N/A;Medtronic LinQ implanted by Dr Johney Frame for palpitaitons and dizziness  . Tee without cardioversion N/A 10/15/2014    Procedure: TRANSESOPHAGEAL ECHOCARDIOGRAM (TEE);  Surgeon: Vesta Mixer, MD;  Location: [facility] ENDOSCOPY;  Service: Cardiovascular;  Laterality: N/A;  . Electrophysiologic study N/A 10/15/2014    PVI and CTI ablation by Dr Johney Frame  . Cardioversion N/A 02/03/2015    Procedure: CARDIOVERSION;  Surgeon: Lars Masson, MD;  Location: [facility] ENDOSCOPY;  Service: Cardiovascular;  Laterality: N/A;    reports that she quit  smoking about 19 years ago. Her smoking use included Cigarettes. She has a 10 pack-year smoking history. She does not have any smokeless tobacco history on file. She reports that she does not drink alcohol or use illicit drugs. family history includes Alcohol abuse in her brother and father; Brain cancer in her sister; Breast cancer in her mother and sister; Cancer in her brother; Diabetes in her mother; Heart attack in her brother and brother; Heart disease in her brother, brother, and mother. Allergies  Allergen Reactions  . Simvastatin Rash      Review of Systems  Constitutional: Positive for fatigue. Negative for chills.  HENT: Positive for congestion and voice change.   Respiratory: Positive for cough. Negative for shortness of breath and wheezing.   Cardiovascular: Negative for chest pain.       Objective:   Physical Exam  Constitutional: She appears well-developed and well-nourished.  HENT:  Right Ear: External ear normal.  Left Ear: External ear normal.  Mouth/Throat: Oropharynx is clear and moist.  Neck: Neck supple.  Cardiovascular: Normal rate and regular rhythm.   Pulmonary/Chest:  No wheezes. She has some faint crackles/rales left base. Right lung is clear. Normal respiratory rate. No respiratory distress. Pulse oximetry 94%  Lymphadenopathy:    She has no cervical adenopathy.          Assessment & Plan:  Cough. Patient has no fever  at this point but does have some slightly asymmetric sounds with a few rales left base. Given her age and other comorbidities will cover with Ceftin 500 mg twice daily for 10 days. Would avoid quinolone with Tikosyn and increased risk of prolonged QT interval. Monitor Coumadin closely  [name] MD [facility]

## 2015-09-13 NOTE — Progress Notes (Signed)
Pre visit review using our clinic review tool, if applicable. No additional management support is needed unless otherwise documented below in the visit note. 

## 2015-09-14 ENCOUNTER — Ambulatory Visit (INDEPENDENT_AMBULATORY_CARE_PROVIDER_SITE_OTHER): Payer: PPO | Admitting: *Deleted

## 2015-09-14 DIAGNOSIS — R55 Syncope and collapse: Secondary | ICD-10-CM

## 2015-09-15 NOTE — Progress Notes (Signed)
Carelink Summary Report / Loop Recorder 

## 2015-09-17 LAB — CUP PACEART REMOTE DEVICE CHECK: MDC IDC SESS DTM: 20170325223700

## 2015-09-17 NOTE — Progress Notes (Signed)
Carelink summary report received. Battery status OK. Normal device function. No new symptom episodes, tachy episodes, brady, or pause episodes. 2.9% AF burden (rate underestimated 2/2 atrial flutter not detected as AF - see presenting rhythm and HR trend). +Tikosyn and Warfarin. Due JA 10/2015

## 2015-09-19 LAB — CUP PACEART REMOTE DEVICE CHECK: Date Time Interrogation Session: 20170424230852

## 2015-09-19 NOTE — Progress Notes (Signed)
Carelink summary report received. Battery status OK. Normal device function. No new symptom episodes, tachy episodes, brady, or pause episodes. 6.4% AF, +Tikosyn and Warfarin. V rates not well controlled. Due JA 10/2015

## 2015-09-23 ENCOUNTER — Ambulatory Visit (INDEPENDENT_AMBULATORY_CARE_PROVIDER_SITE_OTHER): Payer: PPO | Admitting: Pharmacist

## 2015-09-23 DIAGNOSIS — I4891 Unspecified atrial fibrillation: Secondary | ICD-10-CM

## 2015-09-23 DIAGNOSIS — Z7901 Long term (current) use of anticoagulants: Secondary | ICD-10-CM

## 2015-09-23 DIAGNOSIS — Z5181 Encounter for therapeutic drug level monitoring: Secondary | ICD-10-CM | POA: Diagnosis not present

## 2015-09-23 DIAGNOSIS — I4819 Other persistent atrial fibrillation: Secondary | ICD-10-CM

## 2015-09-23 DIAGNOSIS — I481 Persistent atrial fibrillation: Secondary | ICD-10-CM | POA: Diagnosis not present

## 2015-09-23 LAB — POCT INR: INR: 2.4

## 2015-09-29 ENCOUNTER — Telehealth: Payer: Self-pay | Admitting: General Practice

## 2015-09-29 ENCOUNTER — Other Ambulatory Visit: Payer: Self-pay | Admitting: General Practice

## 2015-09-30 ENCOUNTER — Other Ambulatory Visit: Payer: Self-pay | Admitting: *Deleted

## 2015-09-30 MED ORDER — GLUCOSE BLOOD VI STRP: 1.0000 | ORAL_STRIP | Status: DC | PRN

## 2015-09-30 MED ORDER — ONETOUCH ULTRA SYSTEM W/DEVICE KIT: 1.0000 | PACK | Freq: Once | Status: DC

## 2015-09-30 NOTE — Telephone Encounter (Signed)
Notified pt that new glucometer rx sent to pharmacy

## 2015-09-30 NOTE — Telephone Encounter (Signed)
Notified pt that new kit and supplies will be at [facility]

## 2015-09-30 NOTE — Telephone Encounter (Signed)
OK to change?

## 2015-10-14 ENCOUNTER — Ambulatory Visit (INDEPENDENT_AMBULATORY_CARE_PROVIDER_SITE_OTHER): Payer: PPO | Admitting: *Deleted

## 2015-10-14 DIAGNOSIS — R55 Syncope and collapse: Secondary | ICD-10-CM

## 2015-10-17 NOTE — Progress Notes (Signed)
Carelink Summary Report / Loop Recorder 

## 2015-10-19 ENCOUNTER — Other Ambulatory Visit: Payer: Self-pay | Admitting: Cardiovascular Disease

## 2015-10-25 LAB — CUP PACEART REMOTE DEVICE CHECK: MDC IDC SESS DTM: 20170524230824

## 2015-10-25 NOTE — Progress Notes (Signed)
Carelink summary report received. Battery status OK. Normal device function. No new symptom episodes or pause episodes. 2 tachy- 1 with EGM appears same morphology as presenting ?PAT/SVT. 1 brady- previously addressed. 105 AF 2% +Tikosyn +Warfarin. Monthly summary reports and ROV/PRN

## 2015-11-04 ENCOUNTER — Ambulatory Visit (INDEPENDENT_AMBULATORY_CARE_PROVIDER_SITE_OTHER): Payer: PPO

## 2015-11-04 DIAGNOSIS — Z5181 Encounter for therapeutic drug level monitoring: Secondary | ICD-10-CM | POA: Diagnosis not present

## 2015-11-04 DIAGNOSIS — I4891 Unspecified atrial fibrillation: Secondary | ICD-10-CM

## 2015-11-04 DIAGNOSIS — I481 Persistent atrial fibrillation: Secondary | ICD-10-CM

## 2015-11-04 DIAGNOSIS — Z7901 Long term (current) use of anticoagulants: Secondary | ICD-10-CM | POA: Diagnosis not present

## 2015-11-04 DIAGNOSIS — I4819 Other persistent atrial fibrillation: Secondary | ICD-10-CM

## 2015-11-04 LAB — CUP PACEART REMOTE DEVICE CHECK: MDC IDC SESS DTM: 20170623233738

## 2015-11-04 LAB — POCT INR: INR: 2.4

## 2015-11-14 ENCOUNTER — Ambulatory Visit (INDEPENDENT_AMBULATORY_CARE_PROVIDER_SITE_OTHER): Payer: PPO | Admitting: *Deleted

## 2015-11-14 DIAGNOSIS — R55 Syncope and collapse: Secondary | ICD-10-CM | POA: Diagnosis not present

## 2015-11-14 NOTE — Progress Notes (Signed)
Carelink Summary Report / Loop Recorder 

## 2015-11-30 ENCOUNTER — Encounter: Payer: Self-pay | Admitting: Internal Medicine

## 2015-12-10 LAB — CUP PACEART REMOTE DEVICE CHECK: Date Time Interrogation Session: 20170724000905

## 2015-12-10 NOTE — Progress Notes (Signed)
Carelink summary report received. Battery status OK. Normal device function. No new symptom episodes, or brady episodes. 23 tachy- No ECGs. 1 new pause- ECG appears undersensing. 235 AF 8.2% +Tikosyn +Warfarin. Monthly summary reports and ROV/PRN

## 2015-12-13 ENCOUNTER — Ambulatory Visit (INDEPENDENT_AMBULATORY_CARE_PROVIDER_SITE_OTHER): Payer: PPO | Admitting: *Deleted

## 2015-12-13 DIAGNOSIS — R55 Syncope and collapse: Secondary | ICD-10-CM

## 2015-12-14 NOTE — Progress Notes (Signed)
Carelink Summary Report / Loop Recorder 

## 2015-12-15 ENCOUNTER — Encounter: Payer: PPO | Admitting: Internal Medicine

## 2015-12-28 ENCOUNTER — Ambulatory Visit (INDEPENDENT_AMBULATORY_CARE_PROVIDER_SITE_OTHER): Payer: PPO | Admitting: *Deleted

## 2015-12-28 DIAGNOSIS — Z7901 Long term (current) use of anticoagulants: Secondary | ICD-10-CM | POA: Diagnosis not present

## 2015-12-28 DIAGNOSIS — Z5181 Encounter for therapeutic drug level monitoring: Secondary | ICD-10-CM

## 2015-12-28 DIAGNOSIS — I4891 Unspecified atrial fibrillation: Secondary | ICD-10-CM | POA: Diagnosis not present

## 2015-12-28 LAB — POCT INR: INR: 1.9

## 2016-01-11 LAB — CUP PACEART REMOTE DEVICE CHECK: Date Time Interrogation Session: 20170823003944

## 2016-01-11 NOTE — Progress Notes (Signed)
Carelink summary report received. Battery status OK. Normal device function. Elevated histograms. No new symptom episodes, brady, or pause episodes. 13 tachy- 3 w/ ECGs appear irregular VS (AF w/ RVR). 98 AF 1.5% +Tikosyn +Warfarin Monthly summary reports and ROV/PRN

## 2016-01-12 ENCOUNTER — Ambulatory Visit (INDEPENDENT_AMBULATORY_CARE_PROVIDER_SITE_OTHER): Payer: PPO | Admitting: *Deleted

## 2016-01-12 DIAGNOSIS — R55 Syncope and collapse: Secondary | ICD-10-CM | POA: Diagnosis not present

## 2016-01-13 NOTE — Progress Notes (Signed)
Carelink Summary Report / Loop Recorder 

## 2016-01-23 ENCOUNTER — Encounter: Payer: Self-pay | Admitting: Internal Medicine

## 2016-01-23 ENCOUNTER — Encounter: Payer: Self-pay | Admitting: *Deleted

## 2016-01-23 ENCOUNTER — Ambulatory Visit (INDEPENDENT_AMBULATORY_CARE_PROVIDER_SITE_OTHER): Payer: PPO | Admitting: *Deleted

## 2016-01-23 ENCOUNTER — Ambulatory Visit (INDEPENDENT_AMBULATORY_CARE_PROVIDER_SITE_OTHER): Payer: PPO | Admitting: Internal Medicine

## 2016-01-23 VITALS — BP 138/76 | HR 110 | Ht 64.0 in | Wt 195.1 lb

## 2016-01-23 DIAGNOSIS — I480 Paroxysmal atrial fibrillation: Secondary | ICD-10-CM | POA: Diagnosis not present

## 2016-01-23 DIAGNOSIS — Z7901 Long term (current) use of anticoagulants: Secondary | ICD-10-CM

## 2016-01-23 DIAGNOSIS — I4891 Unspecified atrial fibrillation: Secondary | ICD-10-CM

## 2016-01-23 DIAGNOSIS — I5022 Chronic systolic (congestive) heart failure: Secondary | ICD-10-CM

## 2016-01-23 DIAGNOSIS — Z5181 Encounter for therapeutic drug level monitoring: Secondary | ICD-10-CM

## 2016-01-23 LAB — BASIC METABOLIC PANEL
BUN: 12 mg/dL (ref 7–25)
CHLORIDE: 102 mmol/L (ref 98–110)
CO2: 27 mmol/L (ref 20–31)
CREATININE: 0.76 mg/dL (ref 0.60–0.88)
Calcium: 9.9 mg/dL (ref 8.6–10.4)
Glucose, Bld: 104 mg/dL — ABNORMAL HIGH (ref 65–99)
POTASSIUM: 4.8 mmol/L (ref 3.5–5.3)
Sodium: 139 mmol/L (ref 135–146)

## 2016-01-23 LAB — CUP PACEART INCLINIC DEVICE CHECK: Date Time Interrogation Session: 20171002161643

## 2016-01-23 LAB — POCT INR: INR: 1.9

## 2016-01-23 LAB — MAGNESIUM: Magnesium: 1.7 mg/dL (ref 1.5–2.5)

## 2016-01-23 NOTE — Patient Instructions (Signed)
Medication Instructions:  Your physician recommends that you continue on your current medications as directed. Please refer to the Current Medication list given to you today.   Labwork: Your physician recommends that you return for lab work today: BMP/Mag   Testing/Procedures: None ordered   Follow-Up: Your physician wants you to follow-up in: 6 months with Dr Johney Frame Bonita Quin will receive a reminder letter in the mail two months in advance. If you don't receive a letter, please call our office to schedule the follow-up appointment.   Any Other Special Instructions Will Be Listed Below (If Applicable).     If you need a refill on your cardiac medications before your next appointment, please call your pharmacy.

## 2016-01-23 NOTE — Progress Notes (Signed)
Electrophysiology Office Note   Date:  01/23/2016   ID:  [name], DOB 09-Sep-1929, MRN [id number]  PCP:  Joanne Post, MD  Cardiologist:  Dr Johnsie Cancel Primary Electrophysiologist: Joanne Grayer, MD    CC: AF   History of Present Illness: [name] is a 82 y.o. female who presents today for electrophysiology evaluation.   She is doing very well.  Her dizziness is resolved.  Very rare palpitations.  Stable SOB.  Today, she denies symptoms of chest pain,  orthopnea, PND, lower extremity edema, claudication, presyncope, syncope, bleeding, or neurologic sequela. The patient is tolerating medications without difficulties and is otherwise without complaint today.    Past Medical History:  Diagnosis Date  . Anemia   . CAD 09/24/2008   AMI in 1998 tx with PCI to LAD;  myoview 1/12:  no ischemia, EF 45%  . CARDIOMYOPATHY 09/24/2008   ischemic;  echo 4/12:  EF 20-25%, mild MR, mod LAE, mod reduced RVSF, mod RVE, mild RAE, mild TR, PASP 40  . CAROTID BRUIT 09/24/2008  . DIABETES MELLITUS 09/24/2008  . Edema 09/24/2008  . GERD (gastroesophageal reflux disease)   . HYPERLIPIDEMIA 09/24/2008  . HYPERTENSION 09/24/2008  . MI 09/24/2008  . Osteopenia   . Persistent atrial fibrillation ([place]) 09/24/2008   s/p DCCV x 4 in past;  Amiod d/c'd 2/2 abnormal PFTs;  Tikosyn load 6/12 with DCCV  . Tachycardia-bradycardia [facility])    Past Surgical History:  Procedure Laterality Date  . CARDIOVERSION N/A 02/03/2015   Procedure: CARDIOVERSION;  Surgeon: Dorothy Spark, MD;  Location: [place];  Service: Cardiovascular;  Laterality: N/A;  . ELECTROPHYSIOLOGIC STUDY N/A 10/15/2014   PVI and CTI ablation by Dr [name]  . HIP FRACTURE SURGERY  2006  . KNEE SURGERY  2008   broken knee  . LOOP RECORDER IMPLANT N/A 11/06/2012   Procedure: LOOP RECORDER IMPLANT;  Surgeon: Joanne Grayer, MD;  Location: [facility] CATH LAB;  Service: Cardiovascular;  Laterality: N/A;Medtronic LinQ implanted by Dr [name] for palpitaitons  and dizziness  . TEE WITHOUT CARDIOVERSION N/A 10/15/2014   Procedure: TRANSESOPHAGEAL ECHOCARDIOGRAM (TEE);  Surgeon: Joanne Headings, MD;  Location: [facility] ENDOSCOPY;  Service: Cardiovascular;  Laterality: N/A;     Current Outpatient Prescriptions  Medication Sig Dispense Refill  . aspirin 81 MG EC tablet Take 81 mg by mouth daily.      Marland Kitchen atorvastatin (LIPITOR) 10 MG tablet Take 1/2 tablet (5 mg total) by mouth daily. 45 tablet 1  . Blood Glucose Monitoring Suppl (ONE TOUCH ULTRA SYSTEM KIT) w/Device KIT 1 kit by Does not apply route once. 1 each 0  . Calcium Citrate-Vitamin D (CALCIUM + D PO) Take 1 tablet by mouth daily.    Marland Kitchen dofetilide (TIKOSYN) 250 MCG capsule Take 1 capsule (250 mcg total) by mouth 2 (two) times daily. 180 capsule 3  . furosemide (LASIX) 40 MG tablet Take 1 tablet (40 mg total) by mouth daily as needed for fluid. 90 tablet 1  . glucose blood test strip 1 each by Other route as needed for other. Use as instructed with One Touch glucometer. 100 each 11  . Magnesium Oxide 400 MG CAPS Take 1 capsule (400 mg total) by mouth daily.  0  . metoprolol (LOPRESSOR) 100 MG tablet Take 0.5 tablets (50 mg total) by mouth 2 (two) times daily. 180 tablet 0  . Multiple Vitamin (MULTIVITAMIN PO) Take 1 tablet by mouth daily.      . nitroGLYCERIN (NITROSTAT) 0.4  MG SL tablet Place 1 tablet (0.4 mg total) under the tongue every 5 (five) minutes x 3 doses as needed for chest pain. 30 tablet 3  . potassium chloride (MICRO-K) 10 MEQ CR capsule Take 1 capsule (10 mEq total) by mouth daily as needed (for potassium repacement when taking lasix). 90 capsule 1  . ramipril (ALTACE) 10 MG capsule Take 2 capsules (20 mg total) by mouth daily. 180 capsule 3  . vitamin E 100 UNIT capsule Take 100 Units by mouth daily.      Marland Kitchen warfarin (COUMADIN) 2.5 MG tablet Take' po 'as directed by Coumadin clinic 105 tablet 1   No current facility-administered medications for this visit.     Allergies:   Simvastatin     Social History:  The patient  reports that she quit smoking about 19 years ago. Her smoking use included Cigarettes. She has a 10.00 pack-year smoking history. She has never used smokeless tobacco. She reports that she does not drink alcohol or use drugs.   Family History:  The patient's  family history includes Alcohol abuse in her brother and father; Brain cancer in her sister; Breast cancer in her mother and sister; Cancer in her brother; Diabetes in her mother; Heart attack in her brother and brother; Heart disease in her brother, brother, and mother.    ROS:  Please see the history of present illness.   All other systems are reviewed and negative.    PHYSICAL EXAM: VS:  BP 138/76   Pulse (!) 110   Ht _0  (1.626 m)   Wt 195 lb 1.9 oz (88.5 kg)   BMI 33.49 kg/m  , BMI Body mass index is 33.49 kg/m. GEN: Well nourished, well developed, in no acute distress  HEENT: normal  Neck: no JVD, carotid bruits, or masses Cardiac: iRRR; no murmurs, rubs, or gallops,no edema  Respiratory:  clear to auscultation bilaterally, normal work of breathing GI: soft, nontender, nondistended, + BS MS: no deformity or atrophy  Skin: warm and dry  Neuro:  Strength and sensation are intact Psych: euthymic mood, full affect  EKG:  EKG is ordered today. The ekg ordered today shows atypical atrial flutter, V rate 110 bpm, LVH, nonspecific St/ [place]  ILR is reviewed and reveals 4% AF (down from 56% preablation)   Recent Labs: 01/31/2015: Hemoglobin 13.7; Platelets 317 04/27/2015: BUN 13; Creat 0.69; Magnesium 2.1; Potassium 4.7; Sodium 135    Lipid Panel     Component Value Date/Time   CHOL 171 02/22/2014 1054   TRIG 173.0 (H) 02/22/2014 1054   HDL 40.20 02/22/2014 1054   CHOLHDL 4 02/22/2014 1054   VLDL 34.6 02/22/2014 1054   LDLCALC 96 02/22/2014 1054     Wt Readings from Last 3 Encounters:  01/23/16 195 lb 1.9 oz (88.5 kg)  09/13/15 200 lb 6.4 oz (90.9 kg)  04/27/15 206 lb 12.8 oz  (93.8 kg)     ASSESSMENT AND PLAN:  1.  Atypical atrial flutter/ atrial fibrillation Doing very well at this time Check bmet, mg today Continue coumadin, Chads2vasc score is at least 7.    Would be reluctant to consider amiodarone given restrictive lung disease, but could reassess with PFTs if needed  2. Hypertensive cardiovascular disease Stable No change required today  3. Chronic systolic dysfunction euvolemic today Continue medical therapy  4. Restrictive lung disease As above  5. Cruz termination pauses/ dizziness Resolved s/p ablation Followed with ILR  Her ILR is approaching ERI.  She wishes  to keep the device in place long term.  I will see again in 6 months  Current medicines are reviewed at length with the patient today.   The patient does not have concerns regarding her medicines.  The following changes were made today:  none    Signed, Joanne Grayer, MD  01/23/2016 1:55 PM     [address] [place] [address] [phone] (office) [phone] (fax)

## 2016-02-10 ENCOUNTER — Other Ambulatory Visit: Payer: Self-pay | Admitting: Internal Medicine

## 2016-02-10 ENCOUNTER — Other Ambulatory Visit: Payer: Self-pay | Admitting: Cardiovascular Disease

## 2016-02-10 MED ORDER — ATORVASTATIN CALCIUM 10 MG PO TABS: 5.0000 mg | ORAL_TABLET | Freq: Every day | ORAL | 3 refills | Status: DC

## 2016-02-13 ENCOUNTER — Encounter (INDEPENDENT_AMBULATORY_CARE_PROVIDER_SITE_OTHER): Payer: Self-pay

## 2016-02-13 ENCOUNTER — Ambulatory Visit (INDEPENDENT_AMBULATORY_CARE_PROVIDER_SITE_OTHER): Payer: PPO | Admitting: *Deleted

## 2016-02-13 DIAGNOSIS — I4891 Unspecified atrial fibrillation: Secondary | ICD-10-CM | POA: Diagnosis not present

## 2016-02-13 DIAGNOSIS — H35363 Drusen (degenerative) of macula, bilateral: Secondary | ICD-10-CM | POA: Diagnosis not present

## 2016-02-13 DIAGNOSIS — Z5181 Encounter for therapeutic drug level monitoring: Secondary | ICD-10-CM

## 2016-02-13 DIAGNOSIS — Z7901 Long term (current) use of anticoagulants: Secondary | ICD-10-CM | POA: Diagnosis not present

## 2016-02-13 DIAGNOSIS — Z23 Encounter for immunization: Secondary | ICD-10-CM

## 2016-02-13 DIAGNOSIS — R55 Syncope and collapse: Secondary | ICD-10-CM | POA: Diagnosis not present

## 2016-02-13 DIAGNOSIS — H43813 Vitreous degeneration, bilateral: Secondary | ICD-10-CM | POA: Diagnosis not present

## 2016-02-13 DIAGNOSIS — Z961 Presence of intraocular lens: Secondary | ICD-10-CM | POA: Diagnosis not present

## 2016-02-13 LAB — POCT INR: INR: 2.6

## 2016-02-13 NOTE — Progress Notes (Signed)
Carelink Summary Report / Loop Recorder 

## 2016-02-15 ENCOUNTER — Other Ambulatory Visit: Payer: Self-pay | Admitting: Internal Medicine

## 2016-02-15 MED ORDER — DOFETILIDE 250 MCG PO CAPS
250.0000 ug | ORAL_CAPSULE | Freq: Two times a day (BID) | ORAL | 3 refills | Status: DC
Start: 2016-02-15 — End: 2017-01-17

## 2016-03-07 ENCOUNTER — Ambulatory Visit (INDEPENDENT_AMBULATORY_CARE_PROVIDER_SITE_OTHER): Payer: PPO | Admitting: *Deleted

## 2016-03-07 DIAGNOSIS — Z7901 Long term (current) use of anticoagulants: Secondary | ICD-10-CM

## 2016-03-07 DIAGNOSIS — Z5181 Encounter for therapeutic drug level monitoring: Secondary | ICD-10-CM

## 2016-03-07 DIAGNOSIS — I4891 Unspecified atrial fibrillation: Secondary | ICD-10-CM | POA: Diagnosis not present

## 2016-03-07 LAB — POCT INR: INR: 2

## 2016-03-09 ENCOUNTER — Telehealth: Payer: Self-pay | Admitting: Cardiology

## 2016-03-09 NOTE — Telephone Encounter (Signed)
Spoke w/ pt and requested that she send a manual transmission b/c her home monitor has not updated in at least 14 days.   

## 2016-03-10 LAB — CUP PACEART REMOTE DEVICE CHECK
Date Time Interrogation Session: 20171022024206
MDC IDC PG IMPLANT DT: 20140717

## 2016-03-10 NOTE — Progress Notes (Signed)
Carelink summary report received. Battery status OK. Normal device function. No new symptom episodes, brady, or pause episodes. 1.4% AF, +Warfarin.  Monthly summary reports and ROV/PRN

## 2016-03-12 ENCOUNTER — Ambulatory Visit (INDEPENDENT_AMBULATORY_CARE_PROVIDER_SITE_OTHER): Payer: PPO | Admitting: *Deleted

## 2016-03-12 DIAGNOSIS — R55 Syncope and collapse: Secondary | ICD-10-CM | POA: Diagnosis not present

## 2016-03-13 NOTE — Progress Notes (Signed)
Carelink Summary Report / Loop Recorder 

## 2016-03-28 ENCOUNTER — Ambulatory Visit (INDEPENDENT_AMBULATORY_CARE_PROVIDER_SITE_OTHER): Payer: PPO | Admitting: *Deleted

## 2016-03-28 DIAGNOSIS — Z5181 Encounter for therapeutic drug level monitoring: Secondary | ICD-10-CM | POA: Diagnosis not present

## 2016-03-28 DIAGNOSIS — Z7901 Long term (current) use of anticoagulants: Secondary | ICD-10-CM

## 2016-03-28 DIAGNOSIS — I4891 Unspecified atrial fibrillation: Secondary | ICD-10-CM

## 2016-03-28 LAB — POCT INR: INR: 3.3

## 2016-04-11 ENCOUNTER — Ambulatory Visit (INDEPENDENT_AMBULATORY_CARE_PROVIDER_SITE_OTHER): Payer: PPO | Admitting: *Deleted

## 2016-04-11 DIAGNOSIS — R55 Syncope and collapse: Secondary | ICD-10-CM | POA: Diagnosis not present

## 2016-04-12 NOTE — Progress Notes (Signed)
Carelink Summary Report / Loop Recorder 

## 2016-04-18 ENCOUNTER — Ambulatory Visit (INDEPENDENT_AMBULATORY_CARE_PROVIDER_SITE_OTHER): Payer: PPO | Admitting: *Deleted

## 2016-04-18 DIAGNOSIS — Z5181 Encounter for therapeutic drug level monitoring: Secondary | ICD-10-CM | POA: Diagnosis not present

## 2016-04-18 DIAGNOSIS — I4891 Unspecified atrial fibrillation: Secondary | ICD-10-CM

## 2016-04-18 LAB — POCT INR: INR: 2.9

## 2016-04-21 LAB — CUP PACEART REMOTE DEVICE CHECK
Implantable Pulse Generator Implant Date: 20140717
MDC IDC SESS DTM: 20171121041057

## 2016-04-21 NOTE — Progress Notes (Signed)
Carelink summary report received. Battery status OK. Normal device function. No new symptom episodes, brady, or pause episodes. 52 AF 4.6% +warfarin +tikosyn 1 tachy- ECG appears AF w/ RVR. Monthly summary reports and ROV/PRN

## 2016-04-27 ENCOUNTER — Telehealth: Payer: Self-pay | Admitting: *Deleted

## 2016-04-27 LAB — CUP PACEART REMOTE DEVICE CHECK
Implantable Pulse Generator Implant Date: 20140717
MDC IDC SESS DTM: 20171221044208

## 2016-04-27 NOTE — Telephone Encounter (Signed)
LINQ at RRT as of 03/15/16.  Patient made aware.  She wishes to keep LINQ in place at this time.  Plan for follow-up in 06/2016 as planned.  Patient is aware that she will receive a Carelink monitor return kit.  She is appreciative of call and denies additional questions or concerns at this time.

## 2016-05-16 ENCOUNTER — Ambulatory Visit (INDEPENDENT_AMBULATORY_CARE_PROVIDER_SITE_OTHER): Payer: PPO | Admitting: Pharmacist

## 2016-05-16 DIAGNOSIS — Z5181 Encounter for therapeutic drug level monitoring: Secondary | ICD-10-CM

## 2016-05-16 DIAGNOSIS — I4891 Unspecified atrial fibrillation: Secondary | ICD-10-CM

## 2016-05-16 LAB — POCT INR: INR: 2.1

## 2016-05-29 ENCOUNTER — Other Ambulatory Visit: Payer: Self-pay | Admitting: Internal Medicine

## 2016-06-13 ENCOUNTER — Ambulatory Visit (INDEPENDENT_AMBULATORY_CARE_PROVIDER_SITE_OTHER): Payer: PPO | Admitting: *Deleted

## 2016-06-13 DIAGNOSIS — I4891 Unspecified atrial fibrillation: Secondary | ICD-10-CM | POA: Diagnosis not present

## 2016-06-13 DIAGNOSIS — Z5181 Encounter for therapeutic drug level monitoring: Secondary | ICD-10-CM | POA: Diagnosis not present

## 2016-06-13 LAB — POCT INR: INR: 3

## 2016-06-25 ENCOUNTER — Other Ambulatory Visit: Payer: Self-pay | Admitting: *Deleted

## 2016-06-25 MED ORDER — RAMIPRIL 10 MG PO CAPS: ORAL_CAPSULE | ORAL | 1 refills | Status: DC

## 2016-07-25 ENCOUNTER — Ambulatory Visit (INDEPENDENT_AMBULATORY_CARE_PROVIDER_SITE_OTHER): Payer: PPO | Admitting: *Deleted

## 2016-07-25 DIAGNOSIS — I4891 Unspecified atrial fibrillation: Secondary | ICD-10-CM | POA: Diagnosis not present

## 2016-07-25 DIAGNOSIS — Z5181 Encounter for therapeutic drug level monitoring: Secondary | ICD-10-CM | POA: Diagnosis not present

## 2016-07-25 LAB — POCT INR: INR: 2.4

## 2016-07-27 ENCOUNTER — Encounter: Payer: Self-pay | Admitting: *Deleted

## 2016-08-12 NOTE — Progress Notes (Deleted)
Patient ID: [name], female   DOB: January 20, 1930, 83 y.o.   MRN: [id number] [name] is a pleasant 83 y.o. . WF patient with a h/o persistent atrial fibrillation, CAD, and ischemic CM  Failed cardioversions and decreased  PFTls with amiodarone.    Had ILR placed and ablation Primarily seen by Dr Rayann Heman last 2 years PAF decreased from 56% to 4% post ablation.on low dose tikosyn  Continues anticoagulation. ILR is end of life has not wanted to have it removed   ROS: Denies fever, malais, weight loss, blurry vision, decreased visual acuity, cough, sputum, SOB, hemoptysis, pleuritic pain, palpitaitons, heartburn, abdominal pain, melena, lower extremity edema, claudication, or rash.  All other systems reviewed and negative  General: Affect appropriate Healthy:  appears stated age 6: normal Neck supple with no adenopathy JVP normal no bruits no thyromegaly Lungs clear with no wheezing and good diaphragmatic motion Heart:  S1/S2 no murmur, no rub, gallop or click PMI normal Abdomen: benighn, BS positve, no tenderness, no AAA no bruit.  No HSM or HJR Distal pulses intact with no bruits Plus one LLE edema with L TKR  Neuro non-focal Skin warm and dry No muscular weakness   Current Outpatient Prescriptions  Medication Sig Dispense Refill  . aspirin 81 MG EC tablet Take 81 mg by mouth daily.      Marland Kitchen atorvastatin (LIPITOR) 10 MG tablet Take 0.5 tablets (5 mg total) by mouth daily at 6 PM. 45 tablet 3  . Blood Glucose Monitoring Suppl (ONE TOUCH ULTRA SYSTEM KIT) w/Device KIT 1 kit by Does not apply route once. 1 each 0  . Calcium Citrate-Vitamin D (CALCIUM + D PO) Take 1 tablet by mouth daily.    Marland Kitchen dofetilide (TIKOSYN) 250 MCG capsule Take 1 capsule (250 mcg total) by mouth 2 (two) times daily. 180 capsule 3  . furosemide (LASIX) 40 MG tablet Take 1 tablet (40 mg total) by mouth daily as needed for fluid. 90 tablet 1  . glucose blood test strip 1 each by Other route as needed for other.  Use as instructed with One Touch glucometer. 100 each 11  . Magnesium Oxide 400 MG CAPS Take 1 capsule (400 mg total) by mouth daily.  0  . metoprolol (LOPRESSOR) 100 MG tablet Take 1 tablet by mouth twice a day 180 tablet 3  . Multiple Vitamin (MULTIVITAMIN PO) Take 1 tablet by mouth daily.      . nitroGLYCERIN (NITROSTAT) 0.4 MG SL tablet Place 1 tablet (0.4 mg total) under the tongue every 5 (five) minutes x 3 doses as needed for chest pain. 30 tablet 3  . potassium chloride (MICRO-K) 10 MEQ CR capsule Take 1 capsule (10 mEq total) by mouth daily as needed (for potassium repacement when taking lasix). 90 capsule 1  . ramipril (ALTACE) 10 MG capsule Take 2 capsules (20 mg total) by mouth daily. 180 capsule 1  . vitamin E 100 UNIT capsule Take 100 Units by mouth daily.      Marland Kitchen warfarin (COUMADIN) 2.5 MG tablet Take by mouth as directed by Coumadin clinic. 105 tablet 1   No current facility-administered medications for this visit.     Allergies  Simvastatin  Electrocardiogram:  afib LVH elevated rate 4/15  On 01/11/14  afib rate 51 V pacing   Assessment and Plan CAD: PAF: HTN: Cholesterol Pulmonary     [name]

## 2016-08-15 ENCOUNTER — Ambulatory Visit: Payer: PPO | Admitting: Cardiovascular Disease

## 2016-09-05 ENCOUNTER — Ambulatory Visit (INDEPENDENT_AMBULATORY_CARE_PROVIDER_SITE_OTHER): Payer: PPO | Admitting: Pharmacist

## 2016-09-05 DIAGNOSIS — I4891 Unspecified atrial fibrillation: Secondary | ICD-10-CM

## 2016-09-05 DIAGNOSIS — Z5181 Encounter for therapeutic drug level monitoring: Secondary | ICD-10-CM | POA: Diagnosis not present

## 2016-09-05 LAB — POCT INR: INR: 2.2

## 2016-10-12 ENCOUNTER — Encounter: Payer: Self-pay | Admitting: Family Medicine

## 2016-10-12 ENCOUNTER — Telehealth: Payer: Self-pay

## 2016-10-12 ENCOUNTER — Ambulatory Visit (INDEPENDENT_AMBULATORY_CARE_PROVIDER_SITE_OTHER): Payer: PPO | Admitting: Family Medicine

## 2016-10-12 VITALS — BP 120/70 | HR 66 | Temp 98.4°F

## 2016-10-12 DIAGNOSIS — R059 Cough, unspecified: Secondary | ICD-10-CM

## 2016-10-12 DIAGNOSIS — R05 Cough: Secondary | ICD-10-CM | POA: Diagnosis not present

## 2016-10-12 MED ORDER — CEFUROXIME AXETIL 500 MG PO TABS: 500.0000 mg | ORAL_TABLET | Freq: Two times a day (BID) | ORAL | 0 refills | Status: DC

## 2016-10-12 NOTE — Patient Instructions (Signed)
Drink plenty of fluids Follow up promptly for any increased shortness of breath, fever, or recurrent vomiting.

## 2016-10-12 NOTE — Telephone Encounter (Signed)
[facility]    [facility] Medical Call Center Patient Name: [name] Gender: Female DOB: May 23, 1929 Age: 83 Y 24 D Return Phone Number: [phone] (Primary), [phone] (Secondary) City/State/Zip: [place] [address] Client [facility] Night - Client Client Site [facility] - Night Physician [name] - MD Who Is Calling Patient / Member / Family / Caregiver Call Type Triage / Clinical Caller Name Tekeisha Grewal Relationship To Patient Daughter Return Phone Number [phone] (Primary) Chief Complaint Pale Skin Reason for Call Symptomatic / Request for Health Information Initial Comment Caller states her mother is is sick and not feeling well. She is pale, nauseous, has a fever (101.8) and has a nasty cough. She also has heart problems so she is unsure what she can and cannot give her.

## 2016-10-12 NOTE — Progress Notes (Signed)
Subjective:     Patient ID: [name], female   DOB: August 05, 1929, 83 y.o.   MRN: [id number]  HPI Patient seen with cough and possible fever last night. She states she had onset of illness about a week ago. She had cough productive of green sputum past few days. Last night reported fever 101.8. None documented today. She had some chills last night. No nausea or vomiting. No dyspnea. Increased fatigue.  Chronic problems include history of atrial fibrillation, hypertension, CAD, systolic heart failure, obstructive sleep apnea  Past Medical History:  Diagnosis Date  . Anemia   . CAD 09/24/2008   AMI in 1998 tx with PCI to LAD;  myoview 1/12:  no ischemia, EF 45%  . CARDIOMYOPATHY 09/24/2008   ischemic;  echo 4/12:  EF 20-25%, mild MR, mod LAE, mod reduced RVSF, mod RVE, mild RAE, mild TR, PASP 40  . CAROTID BRUIT 09/24/2008  . DIABETES MELLITUS 09/24/2008  . Edema 09/24/2008  . GERD (gastroesophageal reflux disease)   . HYPERLIPIDEMIA 09/24/2008  . HYPERTENSION 09/24/2008  . MI 09/24/2008  . Osteopenia   . Persistent atrial fibrillation ([facility]) 09/24/2008   s/p DCCV x 4 in past;  Amiod d/c'd 2/2 abnormal PFTs;  Tikosyn load 6/12 with DCCV  . Tachycardia-bradycardia [facility])    Past Surgical History:  Procedure Laterality Date  . CARDIOVERSION N/A 02/03/2015   Procedure: CARDIOVERSION;  Surgeon: Lars Masson, MD;  Location: [facility] ENDOSCOPY;  Service: Cardiovascular;  Laterality: N/A;  . ELECTROPHYSIOLOGIC STUDY N/A 10/15/2014   PVI and CTI ablation by Dr Johney Frame  . HIP FRACTURE SURGERY  2006  . KNEE SURGERY  2008   broken knee  . LOOP RECORDER IMPLANT N/A 11/06/2012   Procedure: LOOP RECORDER IMPLANT;  Surgeon: Hillis Range, MD;  Location: [facility] CATH LAB;  Service: Cardiovascular;  Laterality: N/A;Medtronic LinQ implanted by Dr Johney Frame for palpitaitons and dizziness  . TEE WITHOUT CARDIOVERSION N/A 10/15/2014   Procedure: TRANSESOPHAGEAL ECHOCARDIOGRAM (TEE);  Surgeon: Vesta Mixer, MD;  Location: [facility]  ENDOSCOPY;  Service: Cardiovascular;  Laterality: N/A;    reports that she quit smoking about 20 years ago. Her smoking use included Cigarettes. She has a 10.00 pack-year smoking history. She has never used smokeless tobacco. She reports that she does not drink alcohol or use drugs. family history includes Alcohol abuse in her brother and father; Brain cancer in her sister; Breast cancer in her mother and sister; Cancer in her brother; Diabetes in her mother; Heart attack in her brother and brother; Heart disease in her brother, brother, and mother. Allergies  Allergen Reactions  . Simvastatin Rash     Review of Systems  Constitutional: Positive for fever.  Respiratory: Positive for cough. Negative for shortness of breath.   Cardiovascular: Negative for chest pain.  Gastrointestinal: Negative for abdominal pain, nausea and vomiting.       Objective:   Physical Exam  Constitutional: She appears well-developed and well-nourished.  HENT:  Right Ear: External ear normal.  Left Ear: External ear normal.  Mouth/Throat: Oropharynx is clear and moist.  Neck: Neck supple.  Cardiovascular: Normal rate.   Pulmonary/Chest: Effort normal and breath sounds normal. No respiratory distress. She has no wheezes. She has no rales.  Musculoskeletal: She exhibits no edema.  Lymphadenopathy:    She has no cervical adenopathy.       Assessment:     Cough. Patient is afebrile currently but with reported fever last night which is concerning since she is more than  one week into illness.  She is in no respiratory distress.    Plan:     -We elected to go and cover with Ceftin 500 mg twice a day for 10 days -Follow-up immediately for any vomiting, increased shortness of breath, or persistent fever  [name] MD [facility]

## 2016-10-12 NOTE — Telephone Encounter (Signed)
Pt seen by Dr. Caryl Never today.

## 2016-10-13 ENCOUNTER — Emergency Department (HOSPITAL_COMMUNITY)
Admission: EM | Admit: 2016-10-13 | Discharge: 2016-10-13 | Disposition: A | Discharge status: Home / Self Care | Payer: PPO | Attending: Emergency Medicine | Admitting: Emergency Medicine

## 2016-10-13 ENCOUNTER — Encounter (HOSPITAL_COMMUNITY): Payer: Self-pay

## 2016-10-13 ENCOUNTER — Emergency Department (HOSPITAL_COMMUNITY): Payer: PPO

## 2016-10-13 DIAGNOSIS — I10 Essential (primary) hypertension: Secondary | ICD-10-CM | POA: Diagnosis not present

## 2016-10-13 DIAGNOSIS — Z87891 Personal history of nicotine dependence: Secondary | ICD-10-CM | POA: Diagnosis not present

## 2016-10-13 DIAGNOSIS — I252 Old myocardial infarction: Secondary | ICD-10-CM | POA: Insufficient documentation

## 2016-10-13 DIAGNOSIS — E119 Type 2 diabetes mellitus without complications: Secondary | ICD-10-CM | POA: Diagnosis not present

## 2016-10-13 DIAGNOSIS — R05 Cough: Secondary | ICD-10-CM | POA: Diagnosis not present

## 2016-10-13 DIAGNOSIS — I2510 Atherosclerotic heart disease of native coronary artery without angina pectoris: Secondary | ICD-10-CM | POA: Diagnosis not present

## 2016-10-13 DIAGNOSIS — Z7982 Long term (current) use of aspirin: Secondary | ICD-10-CM | POA: Diagnosis not present

## 2016-10-13 DIAGNOSIS — R059 Cough, unspecified: Secondary | ICD-10-CM

## 2016-10-13 MED ORDER — PREDNISONE 10 MG PO TABS: 10.0000 mg | ORAL_TABLET | Freq: Every day | ORAL | 0 refills | Status: DC

## 2016-10-13 MED ORDER — ALBUTEROL SULFATE HFA 108 (90 BASE) MCG/ACT IN AERS: 1.0000 | INHALATION_SPRAY | Freq: Four times a day (QID) | RESPIRATORY_TRACT | 1 refills | Status: DC | PRN

## 2016-10-13 MED ORDER — ONDANSETRON HCL 4 MG PO TABS: 4.0000 mg | ORAL_TABLET | Freq: Three times a day (TID) | ORAL | 0 refills | Status: DC | PRN

## 2016-10-13 MED ORDER — IPRATROPIUM BROMIDE 0.02 % IN SOLN
0.5000 mg | Freq: Once | RESPIRATORY_TRACT | Status: AC
  Administered 2016-10-13: 0.5 mg via RESPIRATORY_TRACT
  Filled 2016-10-13: qty 2.5

## 2016-10-13 MED ORDER — PREDNISONE 20 MG PO TABS
20.0000 mg | ORAL_TABLET | Freq: Once | ORAL | Status: AC
  Administered 2016-10-13: 20 mg via ORAL
  Filled 2016-10-13: qty 1

## 2016-10-13 NOTE — ED Notes (Signed)
Dr. Adriana Simas suggested to hold off on EKG.

## 2016-10-13 NOTE — ED Provider Notes (Signed)
[facility] DEPT Provider Note   CSN: [id number] Arrival date & time: 10/13/16  0801     History   Chief Complaint Chief Complaint  Patient presents with  . Cough    HPI [name] is a 83 y.o. female.  Patient presents with cough for 6 weeks, worse for the past 2 days with associated nausea and poor appetite. She was seen by her primary care doctor yesterday and placed on cefuroxime. Review of systems positive for productive sputum, fever. She is able to walk about her house without dyspnea or chest pain. Severity of symptoms is moderate. Nothing makes symptoms better or worse.      Past Medical History:  Diagnosis Date  . Anemia   . CAD 09/24/2008   AMI in 1998 tx with PCI to LAD;  myoview 1/12:  no ischemia, EF 45%  . CARDIOMYOPATHY 09/24/2008   ischemic;  echo 4/12:  EF 20-25%, mild MR, mod LAE, mod reduced RVSF, mod RVE, mild RAE, mild TR, PASP 40  . CAROTID BRUIT 09/24/2008  . DIABETES MELLITUS 09/24/2008  . Edema 09/24/2008  . GERD (gastroesophageal reflux disease)   . HYPERLIPIDEMIA 09/24/2008  . HYPERTENSION 09/24/2008  . MI 09/24/2008  . Osteopenia   . Persistent atrial fibrillation ([place]) 09/24/2008   s/p DCCV x 4 in past;  Amiod d/c'd 2/2 abnormal PFTs;  Tikosyn load 6/12 with DCCV  . Tachycardia-bradycardia [facility])     Patient Active Problem List   Diagnosis Date Noted  . Atypical atrial flutter ([place]) 01/17/2015  . Tachycardia-bradycardia ([place]) 09/15/2014  . Encounter for therapeutic drug monitoring 09/02/2013  . OSA on CPAP 03/23/2013  . Obesity (BMI 30-39.9) 03/23/2013  . Chronic systolic heart failure ([place]) 11/02/2012  . Dizziness 11/02/2012  . A-fib ([place]) 08/10/2011  . Osteopenia   . Long term (current) use of anticoagulants 09/21/2010  . DIABETES MELLITUS 09/24/2008  . HYPERCHOLESTEROLEMIA 09/24/2008  . Hyperlipidemia 09/24/2008  . Hypertensive cardiovascular disease 09/24/2008  . MI 09/24/2008  . Coronary atherosclerosis 09/24/2008  . CARDIOMYOPATHY  09/24/2008  . PERSISTENT ATRIAL FIBRILLATION 09/24/2008  . EDEMA 09/24/2008  . CAROTID BRUIT 09/24/2008    Past Surgical History:  Procedure Laterality Date  . CARDIOVERSION N/A 02/03/2015   Procedure: CARDIOVERSION;  Surgeon: Dorothy Spark, MD;  Location: [place];  Service: Cardiovascular;  Laterality: N/A;  . ELECTROPHYSIOLOGIC STUDY N/A 10/15/2014   PVI and CTI ablation by Dr Rayann Heman  . HIP FRACTURE SURGERY  2006  . KNEE SURGERY  2008   broken knee  . LOOP RECORDER IMPLANT N/A 11/06/2012   Procedure: LOOP RECORDER IMPLANT;  Surgeon: Thompson Grayer, MD;  Location: [facility] CATH LAB;  Service: Cardiovascular;  Laterality: N/A;Medtronic LinQ implanted by Dr Rayann Heman for palpitaitons and dizziness  . TEE WITHOUT CARDIOVERSION N/A 10/15/2014   Procedure: TRANSESOPHAGEAL ECHOCARDIOGRAM (TEE);  Surgeon: Thayer Headings, MD;  Location: [place];  Service: Cardiovascular;  Laterality: N/A;    OB History    No data available       Home Medications    Prior to Admission medications   Medication Sig Start Date End Date Taking? Authorizing Provider  aspirin 81 MG EC tablet Take 81 mg by mouth daily.     Yes [provider]  atorvastatin (LIPITOR) 10 MG tablet Take 0.5 tablets (5 mg total) by mouth daily at 6 PM. 02/10/16  Yes Allred, Jeneen Rinks, MD  benzonatate (TESSALON) 200 MG capsule Take 200 mg by mouth 3 (three) times daily as needed for cough.  Yes [provider]  Blood Glucose Monitoring Suppl (ONE TOUCH ULTRA SYSTEM KIT) w/Device KIT 1 kit by Does not apply route once. 09/30/15  Yes [name], Alinda Sierras, MD  Calcium Citrate-Vitamin D (CALCIUM + D PO) Take 1 tablet by mouth daily.   Yes [provider]  cefUROXime (CEFTIN) 500 MG tablet Take 1 tablet (500 mg total) by mouth 2 (two) times daily with a meal. 10/12/16  Yes [name], Alinda Sierras, MD  dofetilide (TIKOSYN) 250 MCG capsule Take 1 capsule (250 mcg total) by mouth 2 (two) times daily. 02/15/16  Yes Allred,  Jeneen Rinks, MD  glucose blood test strip 1 each by Other route as needed for other. Use as instructed with One Touch glucometer. 09/30/15  Yes [name], Alinda Sierras, MD  Magnesium Oxide 400 MG CAPS Take 1 capsule (400 mg total) by mouth daily. 11/15/14  Yes Sherran Needs, NP  metoprolol (LOPRESSOR) 100 MG tablet Take 1 tablet by mouth twice a day 02/10/16  Yes Allred, Jeneen Rinks, MD  Multiple Vitamin (MULTIVITAMIN PO) Take 1 tablet by mouth daily.     Yes [provider]  nitroGLYCERIN (NITROSTAT) 0.4 MG SL tablet Place 1 tablet (0.4 mg total) under the tongue every 5 (five) minutes x 3 doses as needed for chest pain. 02/28/15  Yes Sherran Needs, NP  ramipril (ALTACE) 10 MG capsule Take 2 capsules (20 mg total) by mouth daily. 06/25/16  Yes Allred, Jeneen Rinks, MD  vitamin E 100 UNIT capsule Take 100 Units by mouth daily.     Yes [provider]  warfarin (COUMADIN) 2.5 MG tablet Take by mouth as directed by Coumadin clinic. Patient taking differently: Take 2.5 mg daily except Tuesday, Take 1.25 mg on Tuesday. 02/10/16  Yes Josue Hector, MD  albuterol (PROVENTIL HFA;VENTOLIN HFA) 108 (90 Base) MCG/ACT inhaler Inhale 1-2 puffs into the lungs every 6 (six) hours as needed for wheezing or shortness of breath. 10/13/16   Nat Christen, MD  furosemide (LASIX) 40 MG tablet Take 1 tablet (40 mg total) by mouth daily as needed for fluid. Patient not taking: Reported on 10/13/2016 04/29/14   Thompson Grayer, MD  ondansetron (ZOFRAN) 4 MG tablet Take 1 tablet (4 mg total) by mouth every 8 (eight) hours as needed for nausea or vomiting. 10/13/16   Nat Christen, MD  potassium chloride (MICRO-K) 10 MEQ CR capsule Take 1 capsule (10 mEq total) by mouth daily as needed (for potassium repacement when taking lasix). Patient not taking: Reported on 10/13/2016 04/29/14   Thompson Grayer, MD  predniSONE (DELTASONE) 10 MG tablet Take 1 tablet (10 mg total) by mouth daily with breakfast. 10/13/16   Nat Christen, MD    Family  History Family History  Problem Relation Age of Onset  . Breast cancer Mother   . Heart disease Mother   . Diabetes Mother   . Alcohol abuse Father   . Breast cancer Sister   . Brain cancer Sister        cause of death at 83 years old  . Alcohol abuse Brother   . Heart disease Brother   . Heart disease Brother   . Cancer Brother        unknown type  . Coronary artery disease Unknown   . Hypertension Unknown   . Heart attack Brother   . Heart attack Brother     Social History Social History  Substance Use Topics  . Smoking status: Former Smoker    Packs/day: 0.50  Years: 20.00    Types: Cigarettes    Quit date: 07/11/1996  . Smokeless tobacco: Never Used  . Alcohol use No     Allergies   Simvastatin   Review of Systems Review of Systems  All other systems reviewed and are negative.    Physical Exam Updated Vital Signs BP 116/63 (BP Location: Left Arm)   Pulse 93   Temp 98.1 F (36.7 C) (Oral)   Resp 18   SpO2 91%   Physical Exam  Constitutional: She is oriented to person, place, and time. She appears well-developed and well-nourished.  No acute distress  HENT:  Head: Normocephalic and atraumatic.  Eyes: Conjunctivae are normal.  Neck: Neck supple.  Cardiovascular: Normal rate and regular rhythm.   Pulmonary/Chest: Effort normal and breath sounds normal.  Abdominal: Soft. Bowel sounds are normal.  Musculoskeletal: Normal range of motion.  Neurological: She is alert and oriented to person, place, and time.  Skin: Skin is warm and dry.  Psychiatric: She has a normal mood and affect. Her behavior is normal.  Nursing note and vitals reviewed.    ED Treatments / Results  Labs (all labs ordered are listed, but only abnormal results are displayed) Labs Reviewed - No data to display  EKG  EKG Interpretation None       Radiology Dg Chest 2 View  Result Date: 10/13/2016 CLINICAL DATA:  Cough 6 weeks recently worse which shortness-of-breath.  EXAM: CHEST  2 VIEW COMPARISON:  09/29/2010 FINDINGS: Lungs are adequately inflated without focal airspace consolidation or effusion. Mild stable cardiomegaly. Loop recorder projected over the left anterior lower chest. Minimal calcified plaque over the aortic arch. Degenerative change of the spine. Degenerative change of the shoulders. IMPRESSION: No acute cardiopulmonary disease. Mild stable cardiomegaly. Aortic atherosclerosis. Electronically Signed   By: Marin Olp M.D.   On: 10/13/2016 08:56    Procedures Procedures (including critical care time)  Medications Ordered in ED Medications  predniSONE (DELTASONE) tablet 20 mg (20 mg Oral Given 10/13/16 0927)  ipratropium (ATROVENT) nebulizer solution 0.5 mg (0.5 mg Nebulization Given 10/13/16 0927)     Initial Impression / Assessment and Plan / ED Course  I have reviewed the triage vital signs and the nursing notes.  Pertinent labs & imaging results that were available during my care of the patient were reviewed by me and considered in my medical decision making (see chart for details).     Patient is in no acute distress. History and physical consistent with URI. She feels better after albuterol/Atrovent nebulizer treatment. Discharge medications albuterol inhaler, Zofran 4 mg, prednisone.  Final Clinical Impressions(s) / ED Diagnoses   Final diagnoses:  Cough    New Prescriptions New Prescriptions   ALBUTEROL (PROVENTIL HFA;VENTOLIN HFA) 108 (90 BASE) MCG/ACT INHALER    Inhale 1-2 puffs into the lungs every 6 (six) hours as needed for wheezing or shortness of breath.   ONDANSETRON (ZOFRAN) 4 MG TABLET    Take 1 tablet (4 mg total) by mouth every 8 (eight) hours as needed for nausea or vomiting.   PREDNISONE (DELTASONE) 10 MG TABLET    Take 1 tablet (10 mg total) by mouth daily with breakfast.     Nat Christen, MD 10/13/16 1326

## 2016-10-13 NOTE — ED Triage Notes (Signed)
Per pt, started feeling unwell 2 days ago.  Pt has had cough for 6 weeks.  2 days ago nausea with decreased appetite.  Pt went to MD yesterday and told she may have early pna.  No xray completed.  Pt given meds yesterday and told to be seen if not improving.  Pt did not sleep last night.

## 2016-10-13 NOTE — ED Notes (Signed)
Pt in DG at present time. 

## 2016-10-13 NOTE — Discharge Instructions (Signed)
Chest x-ray showed no obvious pneumonia. Continue the antibiotic prescribed by her primary care doctor. Additional prescriptions for inhaler, prednisone, nausea medication.  Increase fluids, rest, Tylenol.

## 2016-10-17 ENCOUNTER — Ambulatory Visit (INDEPENDENT_AMBULATORY_CARE_PROVIDER_SITE_OTHER): Payer: PPO | Admitting: *Deleted

## 2016-10-17 DIAGNOSIS — Z5181 Encounter for therapeutic drug level monitoring: Secondary | ICD-10-CM

## 2016-10-17 DIAGNOSIS — I4891 Unspecified atrial fibrillation: Secondary | ICD-10-CM

## 2016-10-17 LAB — POCT INR: INR: 2.9

## 2016-11-28 ENCOUNTER — Ambulatory Visit (INDEPENDENT_AMBULATORY_CARE_PROVIDER_SITE_OTHER): Payer: PPO | Admitting: Pharmacist

## 2016-11-28 ENCOUNTER — Encounter (INDEPENDENT_AMBULATORY_CARE_PROVIDER_SITE_OTHER): Payer: Self-pay

## 2016-11-28 DIAGNOSIS — I4891 Unspecified atrial fibrillation: Secondary | ICD-10-CM

## 2016-11-28 DIAGNOSIS — Z5181 Encounter for therapeutic drug level monitoring: Secondary | ICD-10-CM

## 2016-11-28 LAB — POCT INR: INR: 2.5

## 2016-12-08 ENCOUNTER — Other Ambulatory Visit: Payer: Self-pay | Admitting: Internal Medicine

## 2017-01-09 ENCOUNTER — Ambulatory Visit (INDEPENDENT_AMBULATORY_CARE_PROVIDER_SITE_OTHER): Payer: PPO | Admitting: *Deleted

## 2017-01-09 DIAGNOSIS — I4891 Unspecified atrial fibrillation: Secondary | ICD-10-CM

## 2017-01-09 DIAGNOSIS — Z5181 Encounter for therapeutic drug level monitoring: Secondary | ICD-10-CM

## 2017-01-09 LAB — POCT INR: INR: 1.9

## 2017-01-17 ENCOUNTER — Other Ambulatory Visit: Payer: Self-pay | Admitting: Internal Medicine

## 2017-01-28 ENCOUNTER — Other Ambulatory Visit: Payer: Self-pay

## 2017-01-29 ENCOUNTER — Other Ambulatory Visit: Payer: Self-pay | Admitting: *Deleted

## 2017-01-29 MED ORDER — DOFETILIDE 250 MCG PO CAPS: 250.0000 ug | ORAL_CAPSULE | Freq: Two times a day (BID) | ORAL | 0 refills | Status: DC

## 2017-01-29 NOTE — Addendum Note (Signed)
Addended by: Valrie Hart on: 01/29/2017 02:19 PM   Modules accepted: Orders

## 2017-01-29 NOTE — Addendum Note (Signed)
Addended by: Valrie Hart on: 01/29/2017 02:12 PM   Modules accepted: Orders

## 2017-02-06 ENCOUNTER — Ambulatory Visit (INDEPENDENT_AMBULATORY_CARE_PROVIDER_SITE_OTHER): Payer: PPO

## 2017-02-06 DIAGNOSIS — Z23 Encounter for immunization: Secondary | ICD-10-CM

## 2017-02-06 DIAGNOSIS — I4891 Unspecified atrial fibrillation: Secondary | ICD-10-CM

## 2017-02-06 DIAGNOSIS — Z5181 Encounter for therapeutic drug level monitoring: Secondary | ICD-10-CM

## 2017-02-06 LAB — POCT INR: INR: 2.2

## 2017-03-04 ENCOUNTER — Other Ambulatory Visit: Payer: Self-pay

## 2017-03-04 MED ORDER — ATORVASTATIN CALCIUM 10 MG PO TABS: 5.0000 mg | ORAL_TABLET | Freq: Every day | ORAL | 0 refills | Status: DC

## 2017-03-18 NOTE — Progress Notes (Signed)
Cardiology Office Note   Date:  03/20/2017   ID:  [name], DOB March 07, 1930, MRN [id number]  PCP:  Joanne Post, MD  Cardiologist:  [name]    Chief Complaint  Patient presents with  . Follow-up    A-Fib      History of Present Illness: [name] is a 83 y.o. female who presents for atypical a flutter/atrial fib with hx of ablation and on coumadin for Chads2vasc score is at least 7. HTN, chronic systolic dysfunction, restrictive lung disease.  Last EF in 2016 was 25%.  She has a loop recorder that has reached ERI.  Last evaluated a year ago.  She does not wish it to have removed.      Today she has a cold but no fever, some cough and wants tessalon Perles renewed. No chest pain, no SOB, she takes the lasix once a month if that.  She did have more salt this Thanksgiving.  She has no blood in her stools, no bleeding.  No awareness of rapid HR.  Other than the cold she has been doing well.    Past Medical History:  Diagnosis Date  . Anemia   . CAD 09/24/2008   AMI in 1998 tx with PCI to LAD;  myoview 1/12:  no ischemia, EF 45%  . CARDIOMYOPATHY 09/24/2008   ischemic;  echo 4/12:  EF 20-25%, mild MR, mod LAE, mod reduced RVSF, mod RVE, mild RAE, mild TR, PASP 40  . CAROTID BRUIT 09/24/2008  . DIABETES MELLITUS 09/24/2008  . Edema 09/24/2008  . GERD (gastroesophageal reflux disease)   . HYPERLIPIDEMIA 09/24/2008  . HYPERTENSION 09/24/2008  . MI 09/24/2008  . Osteopenia   . Persistent atrial fibrillation ([place]) 09/24/2008   s/p DCCV x 4 in past;  Amiod d/c'd 2/2 abnormal PFTs;  Tikosyn load 6/12 with DCCV  . Tachycardia-bradycardia [facility])     Past Surgical History:  Procedure Laterality Date  . CARDIOVERSION N/A 02/03/2015   Procedure: CARDIOVERSION;  Surgeon: Joanne Spark, MD;  Location: [place];  Service: Cardiovascular;  Laterality: N/A;  . ELECTROPHYSIOLOGIC STUDY N/A 10/15/2014   PVI and CTI ablation by Dr [name]  . HIP FRACTURE SURGERY  2006  . KNEE  SURGERY  2008   broken knee  . LOOP RECORDER IMPLANT N/A 11/06/2012   Procedure: LOOP RECORDER IMPLANT;  Surgeon: Joanne Grayer, MD;  Location: [facility] CATH LAB;  Service: Cardiovascular;  Laterality: N/A;Medtronic LinQ implanted by Dr [name] for palpitaitons and dizziness  . TEE WITHOUT CARDIOVERSION N/A 10/15/2014   Procedure: TRANSESOPHAGEAL ECHOCARDIOGRAM (TEE);  Surgeon: Joanne Headings, MD;  Location: [facility] ENDOSCOPY;  Service: Cardiovascular;  Laterality: N/A;     Current Outpatient Medications  Medication Sig Dispense Refill  . albuterol (PROVENTIL HFA;VENTOLIN HFA) 108 (90 Base) MCG/ACT inhaler Inhale 1-2 puffs into the lungs every 6 (six) hours as needed for wheezing or shortness of breath. 1 Inhaler 1  . aspirin 81 MG EC tablet Take 81 mg by mouth daily.      Marland Kitchen atorvastatin (LIPITOR) 10 MG tablet Take 0.5 tablets (5 mg total) by mouth daily at 6 PM. 45 tablet 0  . Blood Glucose Monitoring Suppl (ONE TOUCH ULTRA SYSTEM KIT) w/Device KIT 1 kit by Does not apply route once. 1 each 0  . Calcium Citrate-Vitamin D (CALCIUM + D PO) Take 1 tablet by mouth daily.    Marland Kitchen dofetilide (TIKOSYN) 250 MCG capsule Take 1 capsule (250 mcg total) by mouth 2 (two)  times daily. 180 capsule 2  . furosemide (LASIX) 40 MG tablet Take 1 tablet (40 mg total) by mouth daily as needed for fluid. 90 tablet 3  . glucose blood test strip 1 each by Other route as needed for other. Use as instructed with One Touch glucometer. 100 each 11  . Magnesium Oxide 400 MG CAPS Take 1 capsule (400 mg total) by mouth daily. 90 capsule 3  . metoprolol tartrate (LOPRESSOR) 100 MG tablet Take 1 tablet (100 mg total) by mouth 2 (two) times daily. 180 tablet 3  . Multiple Vitamin (MULTIVITAMIN PO) Take 1 tablet by mouth daily.      . nitroGLYCERIN (NITROSTAT) 0.4 MG SL tablet Place 1 tablet (0.4 mg total) under the tongue every 5 (five) minutes x 3 doses as needed for chest pain. 30 tablet 3  . potassium chloride (MICRO-K) 10 MEQ CR capsule  Take 1 capsule (10 mEq total) by mouth daily as needed (for potassium repacement when taking lasix). 90 capsule 3  . ramipril (ALTACE) 10 MG capsule Take 2 capsules (20 mg total) by mouth daily. 180 capsule 3  . vitamin E 100 UNIT capsule Take 100 Units by mouth daily.      Marland Kitchen warfarin (COUMADIN) 2.5 MG tablet Take by mouth as directed by Coumadin clinic. (Patient taking differently: Take 2.5 mg daily except Tuesday, Take 1.25 mg on Tuesday.) 105 tablet 1  . benzonatate (TESSALON) 200 MG capsule Take 200 mg by mouth 3 (three) times daily as needed for cough.    . ondansetron (ZOFRAN) 4 MG tablet Take 1 tablet (4 mg total) by mouth every 8 (eight) hours as needed for nausea or vomiting. (Patient not taking: Reported on 03/20/2017) 10 tablet 0   No current facility-administered medications for this visit.     Allergies:   Simvastatin    Social History:  The patient  reports that she quit smoking about 20 years ago. Her smoking use included cigarettes. She has a 10.00 pack-year smoking history. she has never used smokeless tobacco. She reports that she does not drink alcohol or use drugs.   Family History:  The patient's family history includes Alcohol abuse in her brother and father; Brain cancer in her sister; Breast cancer in her mother and sister; Cancer in her brother; Coronary artery disease in her unknown relative; Diabetes in her mother; Heart attack in her brother and brother; Heart disease in her brother, brother, and mother; Hypertension in her unknown relative.    ROS:  General:no colds or fevers, no weight changes Skin:no rashes or ulcers HEENT:no blurred vision, no congestion CV:see HPI PUL:see HPI GI:no diarrhea constipation or melena, no indigestion GU:no hematuria, no dysuria MS:no joint pain, no claudication Neuro:no syncope, no lightheadedness Endo:no diabetes, no thyroid disease  Wt Readings from Last 3 Encounters:  03/20/17 201 lb 12.8 oz (91.5 kg)  01/23/16 195 lb 1.9  oz (88.5 kg)  09/13/15 200 lb 6.4 oz (90.9 kg)     PHYSICAL EXAM: VS:  BP 118/70   Pulse 64   Resp 16   Ht '5\' 4"'  (1.626 m)   Wt 201 lb 12.8 oz (91.5 kg)   SpO2 96%   BMI 34.64 kg/m  , BMI Body mass index is 34.64 kg/m. General:Pleasant affect, NAD Skin:Warm and dry, brisk capillary refill HEENT:normocephalic, sclera clear, mucus membranes moist Neck:supple, no JVD, no bruits  Heart:S1S2 RRR without murmur, gallup, rub or click Lungs:clear without rales, rhonchi, or wheezes HFS:FSEL, non tender, + BS, do  not palpate liver spleen or masses Ext:1+ lower ext edema, 2+ pedal pulses, 2+ radial pulses Neuro:alert and oriented X 3, MAE, follows commands, + facial symmetry    EKG:  EKG is ordered today. The ekg ordered today demonstrates NSR LVH with repol and Qtc of 444 ms improved from 454 ms last year.  Otherwise no changes.   Recent Labs: No results found for requested labs within last 8760 hours.    Lipid Panel    Component Value Date/Time   CHOL 171 02/22/2014 1054   TRIG 173.0 (H) 02/22/2014 1054   HDL 40.20 02/22/2014 1054   CHOLHDL 4 02/22/2014 1054   VLDL 34.6 02/22/2014 1054   LDLCALC 96 02/22/2014 1054       Other studies Reviewed: Additional studies/ records that were reviewed today include:. TEE 10/15/14 Study Conclusions  - Left ventricle: Systolic function was severely reduced. The   estimated ejection fraction was in the range of 20% to 25%. - Aortic valve: No evidence of vegetation. - Mitral valve: There was moderate regurgitation. - Left atrium: No evidence of thrombus in the atrial cavity or   appendage. There was spontaneous echo contrast (&quot;smoke&quot;). - Right ventricle: The cavity size was mildly dilated. Systolic   function was mildly to moderately reduced. - Tricuspid valve: No evidence of vegetation. There was moderate   regurgitation.   ASSESSMENT AND PLAN:  1.  PAF with hx ablation and maintaining SR. Her Loop has reached ERI  and she wishes to keep. No awareness of any a fib. Remains on Tikosyn and BB.  Will check CMP, Magnesium and CBC also lipids. Refilled meds  amio was stopped in past due to restrictive lung disease.  Qtc is stable.  Follow up with Dr. Rayann Cruz in 3 months.  (last saw Dr. Johnsie Cancel in 2016.  2.  Anticoagulation on coumadin with no bleeding  INR today  3.   HTN  Well controlled  4.   Chronic systolic HF with cardiomyopathy--euvolemic except mild lower ext edema --only takes lasix rarely.        Current medicines are reviewed with the patient today.  The patient Has no concerns regarding medicines.  The following changes have been made:  See above Labs/ tests ordered today include:see above  Disposition:   FU:  see above  Signed, Cecilie Kicks, NP  03/20/2017 12:47 PM    [address], [place], [place], [place] Phone: [phone]; Fax: [phone]

## 2017-03-20 ENCOUNTER — Ambulatory Visit: Payer: PPO | Admitting: Cardiology

## 2017-03-20 ENCOUNTER — Encounter (INDEPENDENT_AMBULATORY_CARE_PROVIDER_SITE_OTHER): Payer: Self-pay

## 2017-03-20 ENCOUNTER — Ambulatory Visit (INDEPENDENT_AMBULATORY_CARE_PROVIDER_SITE_OTHER): Payer: PPO

## 2017-03-20 ENCOUNTER — Telehealth: Payer: Self-pay | Admitting: *Deleted

## 2017-03-20 ENCOUNTER — Encounter: Payer: Self-pay | Admitting: Cardiology

## 2017-03-20 VITALS — BP 118/70 | HR 64 | Resp 16 | Ht 64.0 in | Wt 201.8 lb

## 2017-03-20 DIAGNOSIS — I5022 Chronic systolic (congestive) heart failure: Secondary | ICD-10-CM | POA: Diagnosis not present

## 2017-03-20 DIAGNOSIS — Z5181 Encounter for therapeutic drug level monitoring: Secondary | ICD-10-CM

## 2017-03-20 DIAGNOSIS — I4891 Unspecified atrial fibrillation: Secondary | ICD-10-CM | POA: Diagnosis not present

## 2017-03-20 DIAGNOSIS — Z79899 Other long term (current) drug therapy: Secondary | ICD-10-CM

## 2017-03-20 DIAGNOSIS — I10 Essential (primary) hypertension: Secondary | ICD-10-CM

## 2017-03-20 DIAGNOSIS — Z7901 Long term (current) use of anticoagulants: Secondary | ICD-10-CM

## 2017-03-20 DIAGNOSIS — I480 Paroxysmal atrial fibrillation: Secondary | ICD-10-CM

## 2017-03-20 LAB — CBC WITH DIFFERENTIAL/PLATELET
BASOS ABS: 0.1 10*3/uL (ref 0.0–0.2)
Basos: 0 %
EOS (ABSOLUTE): 0.2 10*3/uL (ref 0.0–0.4)
Eos: 1 %
Hematocrit: 41.9 % (ref 34.0–46.6)
Hemoglobin: 14.1 g/dL (ref 11.1–15.9)
Immature Grans (Abs): 0 10*3/uL (ref 0.0–0.1)
Immature Granulocytes: 0 %
LYMPHS ABS: 3 10*3/uL (ref 0.7–3.1)
Lymphs: 27 %
MCH: 30.7 pg (ref 26.6–33.0)
MCHC: 33.7 g/dL (ref 31.5–35.7)
MCV: 91 fL (ref 79–97)
Monocytes Absolute: 1.4 10*3/uL — ABNORMAL HIGH (ref 0.1–0.9)
Monocytes: 12 %
NEUTROS ABS: 6.6 10*3/uL (ref 1.4–7.0)
Neutrophils: 60 %
PLATELETS: 372 10*3/uL (ref 150–379)
RBC: 4.6 x10E6/uL (ref 3.77–5.28)
RDW: 13.7 % (ref 12.3–15.4)
WBC: 11.1 10*3/uL — ABNORMAL HIGH (ref 3.4–10.8)

## 2017-03-20 LAB — POCT INR: INR: 1.9

## 2017-03-20 LAB — HEPATIC FUNCTION PANEL
ALBUMIN: 3.9 g/dL (ref 3.5–4.7)
ALK PHOS: 95 IU/L (ref 39–117)
ALT: 9 IU/L (ref 0–32)
AST: 16 IU/L (ref 0–40)
BILIRUBIN TOTAL: 0.7 mg/dL (ref 0.0–1.2)
Bilirubin, Direct: 0.2 mg/dL (ref 0.00–0.40)
Total Protein: 6.9 g/dL (ref 6.0–8.5)

## 2017-03-20 LAB — LIPID PANEL
CHOLESTEROL TOTAL: 144 mg/dL (ref 100–199)
Chol/HDL Ratio: 3.5 ratio (ref 0.0–4.4)
HDL: 41 mg/dL (ref >39)
LDL Calculated: 82 mg/dL (ref 0–99)
Triglycerides: 105 mg/dL (ref 0–149)
VLDL Cholesterol Cal: 21 mg/dL (ref 5–40)

## 2017-03-20 LAB — MAGNESIUM: Magnesium: 2 mg/dL (ref 1.6–2.3)

## 2017-03-20 MED ORDER — NITROGLYCERIN 0.4 MG SL SUBL: 0.4000 mg | SUBLINGUAL_TABLET | SUBLINGUAL | 3 refills | Status: DC | PRN

## 2017-03-20 MED ORDER — MAGNESIUM OXIDE 400 MG PO CAPS: 400.0000 mg | ORAL_CAPSULE | Freq: Every day | ORAL | 3 refills | Status: AC

## 2017-03-20 MED ORDER — RAMIPRIL 10 MG PO CAPS: 20.0000 mg | ORAL_CAPSULE | Freq: Every day | ORAL | 3 refills | Status: DC

## 2017-03-20 MED ORDER — ATORVASTATIN CALCIUM 10 MG PO TABS: 5.0000 mg | ORAL_TABLET | Freq: Every day | ORAL | 0 refills | Status: DC

## 2017-03-20 MED ORDER — FUROSEMIDE 40 MG PO TABS: 40.0000 mg | ORAL_TABLET | Freq: Every day | ORAL | 3 refills | Status: DC | PRN

## 2017-03-20 MED ORDER — POTASSIUM CHLORIDE ER 10 MEQ PO CPCR: 10.0000 meq | ORAL_CAPSULE | Freq: Every day | ORAL | 3 refills | Status: DC | PRN

## 2017-03-20 MED ORDER — METOPROLOL TARTRATE 100 MG PO TABS: 100.0000 mg | ORAL_TABLET | Freq: Two times a day (BID) | ORAL | 3 refills | Status: DC

## 2017-03-20 MED ORDER — DOFETILIDE 250 MCG PO CAPS: 250.0000 ug | ORAL_CAPSULE | Freq: Two times a day (BID) | ORAL | 2 refills | Status: DC

## 2017-03-20 NOTE — Telephone Encounter (Signed)
-----   Message from Leone Brand, NP sent at 03/20/2017  4:18 PM EST ----- Labs are stable though tell her if cold does not improve to see PCP.   Unfortunately BMP was not ordered, please have her have it drawn within next 2 weeks when it is easy for her.

## 2017-03-20 NOTE — Patient Instructions (Signed)
Take an extra 1/2 tablet today, then resume same dosage 1 tablet daily except 1/2 tablet on Tuesdays.  Recheck in 6 weeks.

## 2017-03-20 NOTE — Patient Instructions (Addendum)
Your physician recommends that you continue on your current medications as directed. Please refer to the Current Medication list given to you today.   Your physician recommends that you return for lab work in:  TODAY CBC MAG LIPID AND CMET    Your physician recommends that you schedule a follow-up appointment in:  3 MONTHS WITH DR Johney Frame

## 2017-03-21 NOTE — Addendum Note (Signed)
Addended by: Osa Craver on: 03/21/2017 12:25 PM   Modules accepted: Orders

## 2017-03-22 ENCOUNTER — Encounter: Payer: Self-pay | Admitting: Family Medicine

## 2017-03-22 ENCOUNTER — Ambulatory Visit: Payer: PPO | Admitting: Family Medicine

## 2017-03-22 VITALS — BP 130/80 | HR 78 | Temp 97.5°F | Wt 201.0 lb

## 2017-03-22 DIAGNOSIS — R05 Cough: Secondary | ICD-10-CM | POA: Diagnosis not present

## 2017-03-22 DIAGNOSIS — I10 Essential (primary) hypertension: Secondary | ICD-10-CM | POA: Diagnosis not present

## 2017-03-22 DIAGNOSIS — R059 Cough, unspecified: Secondary | ICD-10-CM

## 2017-03-22 MED ORDER — AMOXICILLIN-POT CLAVULANATE 875-125 MG PO TABS: 1.0000 | ORAL_TABLET | Freq: Two times a day (BID) | ORAL | 0 refills | Status: DC

## 2017-03-22 NOTE — Progress Notes (Signed)
Subjective:     Patient ID: [name], female   DOB: October 10, 1929, 83 y.o.   MRN: [id number]  HPI Patient seen with chief complaint of 2 week history of cough. Intermittently productive. No fever. She has had some sinus congestion. No headaches. No hemoptysis. No dyspnea at rest and no dyspnea with exertion. Patient is nonsmoker.  Multiple chronic problems including history of atrial fibrillation, hypertension, CAD. Is on chronic anticoagulation  Past Medical History:  Diagnosis Date  . Anemia   . CAD 09/24/2008   AMI in 1998 tx with PCI to LAD;  myoview 1/12:  no ischemia, EF 45%  . CARDIOMYOPATHY 09/24/2008   ischemic;  echo 4/12:  EF 20-25%, mild MR, mod LAE, mod reduced RVSF, mod RVE, mild RAE, mild TR, PASP 40  . CAROTID BRUIT 09/24/2008  . DIABETES MELLITUS 09/24/2008  . Edema 09/24/2008  . GERD (gastroesophageal reflux disease)   . HYPERLIPIDEMIA 09/24/2008  . HYPERTENSION 09/24/2008  . MI 09/24/2008  . Osteopenia   . Persistent atrial fibrillation ([facility]) 09/24/2008   s/p DCCV x 4 in past;  Amiod d/c'd 2/2 abnormal PFTs;  Tikosyn load 6/12 with DCCV  . Tachycardia-bradycardia [facility])    Past Surgical History:  Procedure Laterality Date  . CARDIOVERSION N/A 02/03/2015   Procedure: CARDIOVERSION;  Surgeon: Lars Masson, MD;  Location: [facility] ENDOSCOPY;  Service: Cardiovascular;  Laterality: N/A;  . ELECTROPHYSIOLOGIC STUDY N/A 10/15/2014   PVI and CTI ablation by Dr Johney Frame  . HIP FRACTURE SURGERY  2006  . KNEE SURGERY  2008   broken knee  . LOOP RECORDER IMPLANT N/A 11/06/2012   Procedure: LOOP RECORDER IMPLANT;  Surgeon: Hillis Range, MD;  Location: [facility] CATH LAB;  Service: Cardiovascular;  Laterality: N/A;Medtronic LinQ implanted by Dr Johney Frame for palpitaitons and dizziness  . TEE WITHOUT CARDIOVERSION N/A 10/15/2014   Procedure: TRANSESOPHAGEAL ECHOCARDIOGRAM (TEE);  Surgeon: Vesta Mixer, MD;  Location: [facility] ENDOSCOPY;  Service: Cardiovascular;  Laterality: N/A;    reports that she quit  smoking about 20 years ago. Her smoking use included cigarettes. She has a 10.00 pack-year smoking history. she has never used smokeless tobacco. She reports that she does not drink alcohol or use drugs. family history includes Alcohol abuse in her brother and father; Brain cancer in her sister; Breast cancer in her mother and sister; Cancer in her brother; Coronary artery disease in her unknown relative; Diabetes in her mother; Heart attack in her brother and brother; Heart disease in her brother, brother, and mother; Hypertension in her unknown relative. Allergies  Allergen Reactions  . Simvastatin Rash     Review of Systems  Constitutional: Negative for chills and fever.  HENT: Positive for congestion.   Respiratory: Positive for cough. Negative for shortness of breath and wheezing.   Cardiovascular: Negative for chest pain.       Objective:   Physical Exam  Constitutional: She appears well-developed and well-nourished.  HENT:  Right Ear: External ear normal.  Left Ear: External ear normal.  Mouth/Throat: Oropharynx is clear and moist.  Neck: Neck supple.  Cardiovascular: Normal rate.  Pulmonary/Chest: Effort normal and breath sounds normal. No respiratory distress. She has no wheezes. She has no rales.  Lymphadenopathy:    She has no cervical adenopathy.       Assessment:     Cough. Differential is acute viral bronchitis and less likely acute sinusitis related    Plan:     -Recommended observation for now. If she develops any fever  or worsening symptoms consider Augmentin 875 mg twice daily for 10 days  Kristian CoveyBruce W [name] MD [facility]

## 2017-03-22 NOTE — Patient Instructions (Signed)
Follow up for any fever or increased shortness of breath. 

## 2017-03-23 LAB — BASIC METABOLIC PANEL
BUN / CREAT RATIO: 23 (calc) — AB (ref 6–22)
BUN: 13 mg/dL (ref 7–25)
CALCIUM: 9 mg/dL (ref 8.6–10.4)
CO2: 27 mmol/L (ref 20–32)
Chloride: 102 mmol/L (ref 98–110)
Creat: 0.57 mg/dL — ABNORMAL LOW (ref 0.60–0.88)
GLUCOSE: 139 mg/dL — AB (ref 65–99)
Potassium: 4.5 mmol/L (ref 3.5–5.3)
SODIUM: 137 mmol/L (ref 135–146)

## 2017-03-27 ENCOUNTER — Telehealth: Payer: Self-pay | Admitting: Cardiology

## 2017-03-27 NOTE — Telephone Encounter (Signed)
New message    [facility] calling to confirm frequency versus quantity is enough for the entire month. The bottle only comes in 25 or 100 tablets. Also, does the number of refills need to increase.  Please call  [phone] [name], ok to leave detailed message on voicemail (leave first and last name)

## 2017-03-27 NOTE — Telephone Encounter (Signed)
LMTCB

## 2017-04-03 NOTE — Telephone Encounter (Signed)
LMTCB

## 2017-04-04 NOTE — Telephone Encounter (Signed)
Lm to call back ./cy 

## 2017-04-09 NOTE — Telephone Encounter (Signed)
I will close encounter - three unsuccessful attempts to contact pharmacy.

## 2017-05-01 ENCOUNTER — Ambulatory Visit (INDEPENDENT_AMBULATORY_CARE_PROVIDER_SITE_OTHER): Payer: PPO | Admitting: *Deleted

## 2017-05-01 ENCOUNTER — Encounter (INDEPENDENT_AMBULATORY_CARE_PROVIDER_SITE_OTHER): Payer: Self-pay

## 2017-05-01 DIAGNOSIS — Z5181 Encounter for therapeutic drug level monitoring: Secondary | ICD-10-CM

## 2017-05-01 DIAGNOSIS — I484 Atypical atrial flutter: Secondary | ICD-10-CM | POA: Diagnosis not present

## 2017-05-01 DIAGNOSIS — I4891 Unspecified atrial fibrillation: Secondary | ICD-10-CM

## 2017-05-01 LAB — POCT INR: INR: 2

## 2017-05-01 NOTE — Patient Instructions (Signed)
Description   Continue  same dosage 1 tablet daily except 1/2 tablet on Tuesdays.  Recheck in 6 weeks.

## 2017-05-13 ENCOUNTER — Other Ambulatory Visit: Payer: Self-pay | Admitting: Pharmacist

## 2017-05-13 MED ORDER — WARFARIN SODIUM 2.5 MG PO TABS: ORAL_TABLET | ORAL | 1 refills | Status: DC

## 2017-05-29 ENCOUNTER — Ambulatory Visit (INDEPENDENT_AMBULATORY_CARE_PROVIDER_SITE_OTHER): Payer: PPO | Admitting: *Deleted

## 2017-05-29 DIAGNOSIS — Z5181 Encounter for therapeutic drug level monitoring: Secondary | ICD-10-CM | POA: Diagnosis not present

## 2017-05-29 DIAGNOSIS — I4891 Unspecified atrial fibrillation: Secondary | ICD-10-CM

## 2017-05-29 LAB — POCT INR: INR: 2.1

## 2017-05-29 MED ORDER — WARFARIN SODIUM 2.5 MG PO TABS: 2.5000 mg | ORAL_TABLET | Freq: Every day | ORAL | 0 refills | Status: DC

## 2017-05-29 NOTE — Patient Instructions (Signed)
Description   Continue  same dosage 1 tablet daily except 1/2 tablet on Tuesdays.  Recheck in 3 weeks with Md Appt

## 2017-06-19 ENCOUNTER — Ambulatory Visit: Payer: PPO | Admitting: Internal Medicine

## 2017-06-19 ENCOUNTER — Ambulatory Visit (INDEPENDENT_AMBULATORY_CARE_PROVIDER_SITE_OTHER): Payer: PPO | Admitting: *Deleted

## 2017-06-19 ENCOUNTER — Encounter: Payer: Self-pay | Admitting: Internal Medicine

## 2017-06-19 VITALS — BP 124/64 | HR 84 | Ht 64.0 in | Wt 204.0 lb

## 2017-06-19 DIAGNOSIS — I4891 Unspecified atrial fibrillation: Secondary | ICD-10-CM

## 2017-06-19 DIAGNOSIS — I484 Atypical atrial flutter: Secondary | ICD-10-CM

## 2017-06-19 DIAGNOSIS — Z5181 Encounter for therapeutic drug level monitoring: Secondary | ICD-10-CM | POA: Diagnosis not present

## 2017-06-19 DIAGNOSIS — I5022 Chronic systolic (congestive) heart failure: Secondary | ICD-10-CM | POA: Diagnosis not present

## 2017-06-19 DIAGNOSIS — I110 Hypertensive heart disease with heart failure: Secondary | ICD-10-CM

## 2017-06-19 LAB — BASIC METABOLIC PANEL
BUN/Creatinine Ratio: 20 (ref 12–28)
BUN: 13 mg/dL (ref 8–27)
CO2: 28 mmol/L (ref 20–29)
Calcium: 10.2 mg/dL (ref 8.7–10.3)
Chloride: 102 mmol/L (ref 96–106)
Creatinine, Ser: 0.66 mg/dL (ref 0.57–1.00)
GFR calc Af Amer: 93 mL/min/{1.73_m2} (ref >59)
GFR calc non Af Amer: 81 mL/min/{1.73_m2} (ref >59)
Glucose: 99 mg/dL (ref 65–99)
Potassium: 5.1 mmol/L (ref 3.5–5.2)
Sodium: 143 mmol/L (ref 134–144)

## 2017-06-19 LAB — POCT INR: INR: 2.6

## 2017-06-19 LAB — MAGNESIUM: Magnesium: 2 mg/dL (ref 1.6–2.3)

## 2017-06-19 NOTE — Patient Instructions (Signed)
Description   Continue same dosage 1 tablet daily except 1/2 tablet on Tuesdays.  Recheck in 4 weeks

## 2017-06-19 NOTE — Patient Instructions (Addendum)
Medication Instructions:  Your physician recommends that you continue on your current medications as directed. Please refer to the Current Medication list given to you today.   Labwork: TODAY: BMET, MG  Testing/Procedures: None ordered  Follow-Up: Your physician wants you to follow-up in: 6 months with Joanne Ache, NP  You will receive a reminder letter in the mail two months in advance. If you don't receive a letter, please call our office to schedule the follow-up appointment.   Your physician wants you to follow-up in: 1 year with Dr. Jacquiline Cruz will receive a reminder letter in the mail two months in advance. If you don't receive a letter, please call our office to schedule the follow-up appointment.   Any Other Special Instructions Will Be Listed Below (If Applicable).     If you need a refill on your cardiac medications before your next appointment, please call your pharmacy.

## 2017-06-19 NOTE — Progress Notes (Signed)
PCP: Eulas Post, MD Primary Cardiologist: Dr Johnsie Cancel Primary EP: Dr Rayann Heman  [name] is a 84 y.o. female who presents today for routine electrophysiology followup.  Since last being seen in our clinic, the patient reports doing very well.  Today, she denies symptoms of palpitations, chest pain, shortness of breath,  lower extremity edema, dizziness, presyncope, or syncope.  The patient is otherwise without complaint today.   Past Medical History:  Diagnosis Date  . Anemia   . CAD 09/24/2008   AMI in 1998 tx with PCI to LAD;  myoview 1/12:  no ischemia, EF 45%  . CARDIOMYOPATHY 09/24/2008   ischemic;  echo 4/12:  EF 20-25%, mild MR, mod LAE, mod reduced RVSF, mod RVE, mild RAE, mild TR, PASP 40  . CAROTID BRUIT 09/24/2008  . DIABETES MELLITUS 09/24/2008  . Edema 09/24/2008  . GERD (gastroesophageal reflux disease)   . HYPERLIPIDEMIA 09/24/2008  . HYPERTENSION 09/24/2008  . MI 09/24/2008  . Osteopenia   . Persistent atrial fibrillation ([place]) 09/24/2008   s/p DCCV x 4 in past;  Amiod d/c'd 2/2 abnormal PFTs;  Tikosyn load 6/12 with DCCV  . Tachycardia-bradycardia [facility])    Past Surgical History:  Procedure Laterality Date  . CARDIOVERSION N/A 02/03/2015   Procedure: CARDIOVERSION;  Surgeon: Dorothy Spark, MD;  Location: [place];  Service: Cardiovascular;  Laterality: N/A;  . ELECTROPHYSIOLOGIC STUDY N/A 10/15/2014   PVI and CTI ablation by Dr Rayann Heman  . HIP FRACTURE SURGERY  2006  . KNEE SURGERY  2008   broken knee  . LOOP RECORDER IMPLANT N/A 11/06/2012   Procedure: LOOP RECORDER IMPLANT;  Surgeon: Thompson Grayer, MD;  Location: [facility] CATH LAB;  Service: Cardiovascular;  Laterality: N/A;Medtronic LinQ implanted by Dr Rayann Heman for palpitaitons and dizziness  . TEE WITHOUT CARDIOVERSION N/A 10/15/2014   Procedure: TRANSESOPHAGEAL ECHOCARDIOGRAM (TEE);  Surgeon: Thayer Headings, MD;  Location: [place];  Service: Cardiovascular;  Laterality: N/A;    ROS- all systems are reviewed and  negatives except as per HPI above  Current Outpatient Medications  Medication Sig Dispense Refill  . albuterol (PROVENTIL HFA;VENTOLIN HFA) 108 (90 Base) MCG/ACT inhaler Inhale 1-2 puffs into the lungs every 6 (six) hours as needed for wheezing or shortness of breath. 1 Inhaler 1  . aspirin 81 MG EC tablet Take 81 mg by mouth daily.      Marland Kitchen atorvastatin (LIPITOR) 10 MG tablet Take 0.5 tablets (5 mg total) by mouth daily at 6 PM. 45 tablet 0  . Blood Glucose Monitoring Suppl (ONE TOUCH ULTRA SYSTEM KIT) w/Device KIT 1 kit by Does not apply route once. 1 each 0  . Calcium Citrate-Vitamin D (CALCIUM + D PO) Take 1 tablet by mouth daily.    Marland Kitchen dofetilide (TIKOSYN) 250 MCG capsule Take 1 capsule (250 mcg total) by mouth 2 (two) times daily. 180 capsule 2  . furosemide (LASIX) 40 MG tablet Take 1 tablet (40 mg total) by mouth daily as needed for fluid. 90 tablet 3  . glucose blood test strip 1 each by Other route as needed for other. Use as instructed with One Touch glucometer. 100 each 11  . Magnesium Oxide 400 MG CAPS Take 1 capsule (400 mg total) by mouth daily. 90 capsule 3  . metoprolol tartrate (LOPRESSOR) 100 MG tablet Take 1 tablet (100 mg total) by mouth 2 (two) times daily. 180 tablet 3  . Multiple Vitamin (MULTIVITAMIN PO) Take 1 tablet by mouth daily.      Marland Kitchen  nitroGLYCERIN (NITROSTAT) 0.4 MG SL tablet Place 1 tablet (0.4 mg total) under the tongue every 5 (five) minutes x 3 doses as needed for chest pain. 30 tablet 3  . ondansetron (ZOFRAN) 4 MG tablet Take 1 tablet (4 mg total) by mouth every 8 (eight) hours as needed for nausea or vomiting. 10 tablet 0  . potassium chloride (MICRO-K) 10 MEQ CR capsule Take 1 capsule (10 mEq total) by mouth daily as needed (for potassium repacement when taking lasix). 90 capsule 3  . ramipril (ALTACE) 10 MG capsule Take 2 capsules (20 mg total) by mouth daily. 180 capsule 3  . vitamin E 100 UNIT capsule Take 100 Units by mouth daily.      Marland Kitchen warfarin  (COUMADIN) 2.5 MG tablet Take 1/2 to 1 tablet by mouth daily as directed by Coumadin clinic. 100 tablet 1  . warfarin (COUMADIN) 2.5 MG tablet Take 1 tablet (2.5 mg total) by mouth daily. 10 tablet 0  . amoxicillin-clavulanate (AUGMENTIN) 875-125 MG tablet Take 1 tablet by mouth 2 (two) times daily. (Patient not taking: Reported on 06/19/2017) 20 tablet 0  . benzonatate (TESSALON) 200 MG capsule Take 200 mg by mouth 3 (three) times daily as needed for cough.     No current facility-administered medications for this visit.     Physical Exam: Vitals:   06/19/17 1029  BP: 124/64  Pulse: 84  Weight: 204 lb (92.5 kg)  Height: '5\' 4"'  (1.626 m)    GEN- The patient is well appearing, alert and oriented x 3 today.   Head- normocephalic, atraumatic Eyes-  Sclera clear, conjunctiva pink Ears- hearing intact Oropharynx- clear Lungs- Clear to ausculation bilaterally, normal work of breathing Heart- Regular rate and rhythm, no murmurs, rubs or gallops, PMI not laterally displaced GI- soft, NT, ND, + BS Extremities- no clubbing, cyanosis, or edema  EKG tracing ordered today is personally reviewed and shows ectopic atrial tachycardia 100 bpm with conversion to sinus rhythm, Qtc 439 msec  Assessment and Plan:  1. Atypical atrial flutter, ectopic atrial tachycardia/ afib Doing reasonably well Bmet, mg today On coumadin for chads2vasc score of 7  2. Hypertensive cardiovascular disease Stable No change required today  3. Chronic systolic dysfunction euvolemic No changes  4. Post terminatino pauses/ dizziness Improved ILR has reached ERI.  She wishes to keep the device in place long term.  Return in 6 months to AF clinic Return to see me in a year  Thompson Grayer MD, [facility] 06/19/2017 11:04 AM

## 2017-06-28 ENCOUNTER — Telehealth: Payer: Self-pay

## 2017-06-28 NOTE — Telephone Encounter (Signed)
-----   Message from Henrietta Dine, RN sent at 06/28/2017  9:32 AM EST ----- Will you take a look at this please?  Thank you! ----- Message ----- From: Leone Brand, NP Sent: 06/28/2017   8:34 AM To: Henrietta Dine, RN  Can you please check with this pt about need for authorization for medication,  I have no idea what medicine but was sent to me from [facility].  Thanks.  I believe it may be Tikosyn.

## 2017-06-28 NOTE — Telephone Encounter (Signed)
I called the pts pharmacy, [facility], and s/w Nedra Hai. Per Nedra Hai the pt has requested a refill on several of her medication too soon and that is why the cost was high. Per Nedra Hai these medications are due for refill on 06/29/17. I was on call for more than 30 mins.  I called the pt and made her aware. She states that this is not what she and her daughter was told by [employer]. She states that she is calling them back to get a better understanding of what is going on with the cost of her Dofetilide. She is advised to call me back if there is anything I can do to help. She verbalized understanding and thanked me for calling her back.

## 2017-07-02 ENCOUNTER — Telehealth: Payer: Self-pay

## 2017-07-02 NOTE — Telephone Encounter (Signed)
Dofetilide tier exception form received via fax from [employer]. I have completed the form and placed it in Dr Amedeo Plenty mail bin awaiting his signature.

## 2017-07-03 NOTE — Telephone Encounter (Signed)
We received a denial on the pts Dofetilide tier exception via fax from [facility]. Reason: A request to cover a higher tier drug at a lower tier cost-sharing level can be submitted if there is a lower tier drug approved for treating the same DX that the requested, higher tier, drug is being used to treat. The pt must try and fail medications that are in the lower  Tier prior to approving a tier exception.  I have done an urgent appeal on this decision.

## 2017-07-04 NOTE — Telephone Encounter (Signed)
Faxed to [employer] tier exception for DOFETILIDE signed by Dr Johney Frame.

## 2017-07-05 NOTE — Telephone Encounter (Signed)
Received a call from Gardiner Rhyme at [employer]. He had 1 question that needed to be clarified for the pts Dofetilide tier exception which was why could the pt not take prior tired and failed medications before switching to Dofetilide. I advised him that those medications was ineffective.  He thanked me for my help and states that we should be receiving their decision soon.

## 2017-07-08 NOTE — Telephone Encounter (Signed)
Approval for Dofetilide received via fax from [facility]. Approval good until 04/22/18.  I have notified the pts pharmacy.

## 2017-07-17 ENCOUNTER — Ambulatory Visit: Payer: PPO | Admitting: *Deleted

## 2017-07-17 DIAGNOSIS — I4891 Unspecified atrial fibrillation: Secondary | ICD-10-CM

## 2017-07-17 DIAGNOSIS — Z5181 Encounter for therapeutic drug level monitoring: Secondary | ICD-10-CM | POA: Diagnosis not present

## 2017-07-17 DIAGNOSIS — I484 Atypical atrial flutter: Secondary | ICD-10-CM | POA: Diagnosis not present

## 2017-07-17 LAB — POCT INR: INR: 2.3

## 2017-07-17 NOTE — Patient Instructions (Signed)
Description   Continue same dosage 1 tablet daily except 1/2 tablet on Tuesdays.  Recheck in 4 weeks  Call if needed or any new medications of procedures [phone]     

## 2017-08-14 ENCOUNTER — Ambulatory Visit: Payer: PPO | Admitting: *Deleted

## 2017-08-14 DIAGNOSIS — Z5181 Encounter for therapeutic drug level monitoring: Secondary | ICD-10-CM

## 2017-08-14 DIAGNOSIS — I484 Atypical atrial flutter: Secondary | ICD-10-CM

## 2017-08-14 DIAGNOSIS — I4891 Unspecified atrial fibrillation: Secondary | ICD-10-CM

## 2017-08-14 LAB — POCT INR: INR: 2.2

## 2017-08-14 NOTE — Patient Instructions (Signed)
Description   Continue same dosage 1 tablet daily except 1/2 tablet on Tuesdays.  Recheck in 4 weeks  Call if needed or any new medications of procedures [phone]     

## 2017-08-29 ENCOUNTER — Other Ambulatory Visit: Payer: Self-pay | Admitting: Cardiology

## 2017-09-04 ENCOUNTER — Ambulatory Visit (INDEPENDENT_AMBULATORY_CARE_PROVIDER_SITE_OTHER): Payer: PPO | Admitting: Family Medicine

## 2017-09-04 VITALS — BP 130/70 | Temp 98.1°F | Wt 203.0 lb

## 2017-09-04 DIAGNOSIS — M25471 Effusion, right ankle: Secondary | ICD-10-CM | POA: Diagnosis not present

## 2017-09-04 DIAGNOSIS — I5022 Chronic systolic (congestive) heart failure: Secondary | ICD-10-CM | POA: Diagnosis not present

## 2017-09-04 DIAGNOSIS — I480 Paroxysmal atrial fibrillation: Secondary | ICD-10-CM | POA: Diagnosis not present

## 2017-09-04 DIAGNOSIS — R609 Edema, unspecified: Secondary | ICD-10-CM

## 2017-09-04 DIAGNOSIS — M25472 Effusion, left ankle: Secondary | ICD-10-CM | POA: Diagnosis not present

## 2017-09-04 MED ORDER — FUROSEMIDE 40 MG PO TABS: 40.0000 mg | ORAL_TABLET | Freq: Every day | ORAL | 5 refills | Status: DC | PRN

## 2017-09-04 MED ORDER — POTASSIUM CHLORIDE ER 10 MEQ PO CPCR: 10.0000 meq | ORAL_CAPSULE | Freq: Every day | ORAL | 5 refills | Status: DC | PRN

## 2017-09-04 NOTE — Patient Instructions (Signed)
Get back on the Furosemide 40 mg - take one half tablet daily for 3-5 days  May increase to 40 mg daily if no improvement in edema with the 20 mg  Elevate legs frequently.    Weigh daily and be in touch if weight increases > 3 pounds in a day or > 5 pounds in a week.

## 2017-09-04 NOTE — Progress Notes (Signed)
Subjective:     Patient ID: [name], female   DOB: January 08, 1930, 84 y.o.   MRN: [id number]  HPI  Patient has chronic problems including hypertensive cardiovascular disease, history of CAD, chronic systolic heart failure, atrial fibrillation, obstructive sleep apnea, hyperlipidemia. She is seen with about 2 week history of some increasing edema ankles and feet bilaterally. Her weight is actually fairly stable. She's not weighing consistently at home but in comparing with previous weights here and at cardiology her weight seems stable. She denies any orthopnea. She has some chronic mild dyspnea with activity but unchanged.   Medications reviewed. She is on several medications for heart failure and is compliant with all those with exception that she's not been taking her Lasix for several months now. Has previously taken Lasix 40 mg daily along with potassium supplement. She denies any recent chest pains. She had recent renal profile through cardiology and she has good renal function. Electrolytes have been stable. Most recent albumin normal  Patient had echocardiogram 6/16 with ejection fraction 20-25%  Social history is that her daughter passed away from metastatic ovarian cancer few days ago. She's been on her feet a lot more than usual related to that. Possibly slightly more sodium intake than usual past week.  Very supportive family and coping fairly well  Past Medical History:  Diagnosis Date  . Anemia   . CAD 09/24/2008   AMI in 1998 tx with PCI to LAD;  myoview 1/12:  no ischemia, EF 45%  . CARDIOMYOPATHY 09/24/2008   ischemic;  echo 4/12:  EF 20-25%, mild MR, mod LAE, mod reduced RVSF, mod RVE, mild RAE, mild TR, PASP 40  . CAROTID BRUIT 09/24/2008  . DIABETES MELLITUS 09/24/2008  . Edema 09/24/2008  . GERD (gastroesophageal reflux disease)   . HYPERLIPIDEMIA 09/24/2008  . HYPERTENSION 09/24/2008  . MI 09/24/2008  . Osteopenia   . Persistent atrial fibrillation ([facility]) 09/24/2008   s/p DCCV x 4  in past;  Amiod d/c'd 2/2 abnormal PFTs;  Tikosyn load 6/12 with DCCV  . Tachycardia-bradycardia [facility])    Past Surgical History:  Procedure Laterality Date  . CARDIOVERSION N/A 02/03/2015   Procedure: CARDIOVERSION;  Surgeon: Lars Masson, MD;  Location: [facility] ENDOSCOPY;  Service: Cardiovascular;  Laterality: N/A;  . ELECTROPHYSIOLOGIC STUDY N/A 10/15/2014   PVI and CTI ablation by Dr Johney Frame  . HIP FRACTURE SURGERY  2006  . KNEE SURGERY  2008   broken knee  . LOOP RECORDER IMPLANT N/A 11/06/2012   Procedure: LOOP RECORDER IMPLANT;  Surgeon: Hillis Range, MD;  Location: [facility] CATH LAB;  Service: Cardiovascular;  Laterality: N/A;Medtronic LinQ implanted by Dr Johney Frame for palpitaitons and dizziness  . TEE WITHOUT CARDIOVERSION N/A 10/15/2014   Procedure: TRANSESOPHAGEAL ECHOCARDIOGRAM (TEE);  Surgeon: Vesta Mixer, MD;  Location: [facility] ENDOSCOPY;  Service: Cardiovascular;  Laterality: N/A;    reports that she quit smoking about 21 years ago. Her smoking use included cigarettes. She has a 10.00 pack-year smoking history. She has never used smokeless tobacco. She reports that she does not drink alcohol or use drugs. family history includes Alcohol abuse in her brother and father; Brain cancer in her sister; Breast cancer in her mother and sister; Cancer in her brother; Coronary artery disease in her unknown relative; Diabetes in her mother; Heart attack in her brother and brother; Heart disease in her brother, brother, and mother; Hypertension in her unknown relative. Allergies  Allergen Reactions  . Simvastatin Rash     Review  of Systems  Constitutional: Negative for appetite change, chills, fever and unexpected weight change.  Respiratory: Negative for cough and wheezing.   Cardiovascular: Positive for leg swelling. Negative for chest pain and palpitations.  Gastrointestinal: Negative for abdominal pain.  Neurological: Negative for dizziness.       Objective:   Physical Exam   Constitutional: She appears well-developed and well-nourished.  Neck: Neck supple. No thyromegaly present.  Cardiovascular: Normal rate.  Pulmonary/Chest: Effort normal and breath sounds normal. No respiratory distress. She has no wheezes. She has no rales.  Musculoskeletal:  Patient has trace pitting edema around both ankles and lower legs.       Assessment:     Patient has history of chronic systolic heart failure and presents with 2 week history of some mild increased edema ankles and lower legs bilaterally. Otherwise fairly asymptomatic. Her weight is actually stable compared to previous weights and lung exam unremarkable.    Plan:     -Elevate legs frequently -Recommend daily weights and be in touch if weight increases more than 3 pounds in 1 day or 5 pounds in one week -Get back on furosemide 40 mg one half tablet daily for the next 3-5 days and if no improvement with that increase to 40 mg daily. We have suggested she treat for about 5 days and if getting back to baseline we'll try leaving off that point. Be in touch if no improvement in edema with furosemide -Also refilled potassium supplement to take concomitant with furosemide -Follow-up immediately for any increased shortness of breath, increasing edema, or other concerns  [name] MD [facility]

## 2017-09-11 ENCOUNTER — Ambulatory Visit: Payer: PPO | Admitting: *Deleted

## 2017-09-11 DIAGNOSIS — I4891 Unspecified atrial fibrillation: Secondary | ICD-10-CM | POA: Diagnosis not present

## 2017-09-11 DIAGNOSIS — I484 Atypical atrial flutter: Secondary | ICD-10-CM

## 2017-09-11 DIAGNOSIS — Z5181 Encounter for therapeutic drug level monitoring: Secondary | ICD-10-CM

## 2017-09-11 LAB — POCT INR: INR: 2.5 (ref 2.0–3.0)

## 2017-09-11 NOTE — Patient Instructions (Signed)
Description   Continue same dosage 1 tablet daily except 1/2 tablet on Tuesdays.  Recheck in 4 weeks  Call if needed or any new medications of procedures [phone]

## 2017-10-09 ENCOUNTER — Ambulatory Visit: Payer: PPO | Admitting: *Deleted

## 2017-10-09 DIAGNOSIS — I4891 Unspecified atrial fibrillation: Secondary | ICD-10-CM | POA: Diagnosis not present

## 2017-10-09 DIAGNOSIS — Z5181 Encounter for therapeutic drug level monitoring: Secondary | ICD-10-CM

## 2017-10-09 LAB — POCT INR: INR: 2.5 (ref 2.0–3.0)

## 2017-10-09 NOTE — Patient Instructions (Signed)
Description   Continue same dosage 1 tablet daily except 1/2 tablet on Tuesdays.  Recheck in 5 weeks  Call if needed or any new medications or procedures [phone]

## 2017-10-14 ENCOUNTER — Other Ambulatory Visit: Payer: Self-pay | Admitting: Cardiovascular Disease

## 2017-10-15 ENCOUNTER — Other Ambulatory Visit: Payer: Self-pay | Admitting: *Deleted

## 2017-11-12 ENCOUNTER — Ambulatory Visit: Payer: PPO

## 2017-11-12 DIAGNOSIS — Z5181 Encounter for therapeutic drug level monitoring: Secondary | ICD-10-CM

## 2017-11-12 DIAGNOSIS — I4891 Unspecified atrial fibrillation: Secondary | ICD-10-CM

## 2017-11-12 LAB — POCT INR: INR: 2.4 (ref 2.0–3.0)

## 2017-11-12 NOTE — Patient Instructions (Signed)
Description   Continue same dosage 1 tablet daily except 1/2 tablet on Tuesdays.  Recheck in 6 weeks  Call if needed or any new medications or procedures [phone]     

## 2017-11-15 ENCOUNTER — Ambulatory Visit (INDEPENDENT_AMBULATORY_CARE_PROVIDER_SITE_OTHER): Payer: PPO | Admitting: Family Medicine

## 2017-11-15 ENCOUNTER — Encounter: Payer: Self-pay | Admitting: Family Medicine

## 2017-11-15 VITALS — BP 130/84 | HR 63 | Temp 97.9°F | Ht 64.0 in | Wt 203.2 lb

## 2017-11-15 DIAGNOSIS — R600 Localized edema: Secondary | ICD-10-CM | POA: Diagnosis not present

## 2017-11-15 DIAGNOSIS — I5022 Chronic systolic (congestive) heart failure: Secondary | ICD-10-CM

## 2017-11-15 LAB — BASIC METABOLIC PANEL
BUN: 20 mg/dL (ref 6–23)
CHLORIDE: 102 meq/L (ref 96–112)
CO2: 31 meq/L (ref 19–32)
Calcium: 9.7 mg/dL (ref 8.4–10.5)
Creatinine, Ser: 0.79 mg/dL (ref 0.40–1.20)
GFR: 73.31 mL/min (ref >60.00)
Glucose, Bld: 90 mg/dL (ref 70–99)
POTASSIUM: 4.6 meq/L (ref 3.5–5.1)
Sodium: 141 mEq/L (ref 135–145)

## 2017-11-15 MED ORDER — POTASSIUM CHLORIDE ER 10 MEQ PO CPCR: 10.0000 meq | ORAL_CAPSULE | Freq: Every day | ORAL | 5 refills | Status: DC | PRN

## 2017-11-15 MED ORDER — FUROSEMIDE 40 MG PO TABS
40.0000 mg | ORAL_TABLET | Freq: Every day | ORAL | 5 refills | Status: DC | PRN
Start: 2017-11-15 — End: 2019-06-17

## 2017-11-15 NOTE — Progress Notes (Signed)
Subjective:     Patient ID: [name], female   DOB: [birth year], 84 y.o.   MRN: [id number]  HPI Patient has known systolic heart failure. She presents with a few day history of increasing edema ankles feet and lower legs. She apparently ran out of her Lasix 4 days ago. She was placed back on this couple months ago. Denies any orthopnea. No chest pain. Compliant with other medications.  She has been eating perhaps a little more sodium than usual recently with some fresh garden vegetables such as tomatoes.  Not doing daily weights at home  Past Medical History:  Diagnosis Date  . Anemia   . CAD 09/24/2008   AMI in 1998 tx with PCI to LAD;  myoview 1/12:  no ischemia, EF 45%  . CARDIOMYOPATHY 09/24/2008   ischemic;  echo 4/12:  EF 20-25%, mild MR, mod LAE, mod reduced RVSF, mod RVE, mild RAE, mild TR, PASP 40  . CAROTID BRUIT 09/24/2008  . DIABETES MELLITUS 09/24/2008  . Edema 09/24/2008  . GERD (gastroesophageal reflux disease)   . HYPERLIPIDEMIA 09/24/2008  . HYPERTENSION 09/24/2008  . MI 09/24/2008  . Osteopenia   . Persistent atrial fibrillation ([facility]) 09/24/2008   s/p DCCV x 4 in past;  Amiod d/c'd 2/2 abnormal PFTs;  Tikosyn load 6/12 with DCCV  . Tachycardia-bradycardia [facility])    Past Surgical History:  Procedure Laterality Date  . CARDIOVERSION N/A 02/03/2015   Procedure: CARDIOVERSION;  Surgeon: Lars Masson, MD;  Location: [facility] ENDOSCOPY;  Service: Cardiovascular;  Laterality: N/A;  . ELECTROPHYSIOLOGIC STUDY N/A 10/15/2014   PVI and CTI ablation by Dr Johney Frame  . HIP FRACTURE SURGERY  2006  . KNEE SURGERY  2008   broken knee  . LOOP RECORDER IMPLANT N/A 11/06/2012   Procedure: LOOP RECORDER IMPLANT;  Surgeon: Hillis Range, MD;  Location: [facility] CATH LAB;  Service: Cardiovascular;  Laterality: N/A;Medtronic LinQ implanted by Dr Johney Frame for palpitaitons and dizziness  . TEE WITHOUT CARDIOVERSION N/A 10/15/2014   Procedure: TRANSESOPHAGEAL ECHOCARDIOGRAM (TEE);  Surgeon: Vesta Mixer, MD;   Location: [facility] ENDOSCOPY;  Service: Cardiovascular;  Laterality: N/A;    reports that she quit smoking about 21 years ago. Her smoking use included cigarettes. She has a 10.00 pack-year smoking history. She has never used smokeless tobacco. She reports that she does not drink alcohol or use drugs. family history includes Alcohol abuse in her brother and father; Brain cancer in her sister; Breast cancer in her mother and sister; Cancer in her brother; Coronary artery disease in her unknown relative; Diabetes in her mother; Heart attack in her brother and brother; Heart disease in her brother, brother, and mother; Hypertension in her unknown relative. Allergies  Allergen Reactions  . Simvastatin Rash     Review of Systems  Constitutional: Negative for unexpected weight change.  Respiratory: Negative for shortness of breath and wheezing.   Cardiovascular: Positive for leg swelling. Negative for chest pain.       Objective:   Physical Exam  Constitutional: She appears well-developed and well-nourished.  Cardiovascular: Normal rate and regular rhythm.  Pulmonary/Chest: Effort normal and breath sounds normal.  Musculoskeletal: She exhibits edema.  Trace edema feet ankles lower legs bilaterally       Assessment:     Bilateral ankle and leg edema probably related running out of Lasix few days ago    Plan:     -Refilled Lasix and potassium -Check basic metabolic panel -Recommended daily weights -Routine follow-up in 3 months  and sooner as needed  Eulas Post MD [place] at [facility]

## 2017-11-21 DIAGNOSIS — H35363 Drusen (degenerative) of macula, bilateral: Secondary | ICD-10-CM | POA: Diagnosis not present

## 2017-11-21 DIAGNOSIS — Z961 Presence of intraocular lens: Secondary | ICD-10-CM | POA: Diagnosis not present

## 2017-12-11 ENCOUNTER — Ambulatory Visit (HOSPITAL_COMMUNITY)
Admission: RE | Admit: 2017-12-11 | Discharge: 2017-12-11 | Disposition: A | Discharge status: Home / Self Care | Payer: PPO | Source: Ambulatory Visit | Attending: Nurse Practitioner | Admitting: Nurse Practitioner

## 2017-12-11 VITALS — BP 124/68 | HR 108 | Ht 65.0 in | Wt 192.4 lb

## 2017-12-11 DIAGNOSIS — M8580 Other specified disorders of bone density and structure, unspecified site: Secondary | ICD-10-CM | POA: Insufficient documentation

## 2017-12-11 DIAGNOSIS — I4891 Unspecified atrial fibrillation: Secondary | ICD-10-CM | POA: Diagnosis not present

## 2017-12-11 DIAGNOSIS — I2510 Atherosclerotic heart disease of native coronary artery without angina pectoris: Secondary | ICD-10-CM | POA: Diagnosis not present

## 2017-12-11 DIAGNOSIS — E785 Hyperlipidemia, unspecified: Secondary | ICD-10-CM | POA: Insufficient documentation

## 2017-12-11 DIAGNOSIS — I252 Old myocardial infarction: Secondary | ICD-10-CM | POA: Diagnosis not present

## 2017-12-11 DIAGNOSIS — Z79899 Other long term (current) drug therapy: Secondary | ICD-10-CM | POA: Diagnosis not present

## 2017-12-11 DIAGNOSIS — K219 Gastro-esophageal reflux disease without esophagitis: Secondary | ICD-10-CM | POA: Diagnosis not present

## 2017-12-11 DIAGNOSIS — R9431 Abnormal electrocardiogram [ECG] [EKG]: Secondary | ICD-10-CM | POA: Diagnosis not present

## 2017-12-11 DIAGNOSIS — Z7901 Long term (current) use of anticoagulants: Secondary | ICD-10-CM | POA: Diagnosis not present

## 2017-12-11 DIAGNOSIS — I481 Persistent atrial fibrillation: Secondary | ICD-10-CM | POA: Diagnosis not present

## 2017-12-11 DIAGNOSIS — I119 Hypertensive heart disease without heart failure: Secondary | ICD-10-CM | POA: Diagnosis not present

## 2017-12-11 DIAGNOSIS — E119 Type 2 diabetes mellitus without complications: Secondary | ICD-10-CM | POA: Diagnosis not present

## 2017-12-11 DIAGNOSIS — Z7982 Long term (current) use of aspirin: Secondary | ICD-10-CM | POA: Diagnosis not present

## 2017-12-11 DIAGNOSIS — Z87891 Personal history of nicotine dependence: Secondary | ICD-10-CM | POA: Diagnosis not present

## 2017-12-11 DIAGNOSIS — I4892 Unspecified atrial flutter: Secondary | ICD-10-CM | POA: Diagnosis not present

## 2017-12-11 NOTE — Progress Notes (Signed)
Primary Care Physician: Eulas Post, MD Referring Physician: Dr. Arnette Schaumann is a 84 y.o. female  that presents for f/u in the afib clinic. She is on Tikosyn for control of her afib. She did have a Linq that came to end of battery life a few months ago. She is in afib today but unaware. She feels well. Last carelink summary in December 2017 showed 21 AF episodes (1.5 % burden)   Today, she denies symptoms of palpitations, chest pain, shortness of breath, orthopnea, PND, lower extremity edema, dizziness, presyncope, syncope, or neurologic sequela. The patient is tolerating medications without difficulties and is otherwise without complaint today.   Past Medical History:  Diagnosis Date  . Anemia   . CAD 09/24/2008   AMI in 1998 tx with PCI to LAD;  myoview 1/12:  no ischemia, EF 45%  . CARDIOMYOPATHY 09/24/2008   ischemic;  echo 4/12:  EF 20-25%, mild MR, mod LAE, mod reduced RVSF, mod RVE, mild RAE, mild TR, PASP 40  . CAROTID BRUIT 09/24/2008  . DIABETES MELLITUS 09/24/2008  . Edema 09/24/2008  . GERD (gastroesophageal reflux disease)   . HYPERLIPIDEMIA 09/24/2008  . HYPERTENSION 09/24/2008  . MI 09/24/2008  . Osteopenia   . Persistent atrial fibrillation ([place]) 09/24/2008   s/p DCCV x 4 in past;  Amiod d/c'd 2/2 abnormal PFTs;  Tikosyn load 6/12 with DCCV  . Tachycardia-bradycardia [facility])    Past Surgical History:  Procedure Laterality Date  . CARDIOVERSION N/A 02/03/2015   Procedure: CARDIOVERSION;  Surgeon: Dorothy Spark, MD;  Location: [place];  Service: Cardiovascular;  Laterality: N/A;  . ELECTROPHYSIOLOGIC STUDY N/A 10/15/2014   PVI and CTI ablation by Dr Rayann Heman  . HIP FRACTURE SURGERY  2006  . KNEE SURGERY  2008   broken knee  . LOOP RECORDER IMPLANT N/A 11/06/2012   Procedure: LOOP RECORDER IMPLANT;  Surgeon: Thompson Grayer, MD;  Location: [place] CATH LAB;  Service: Cardiovascular;  Laterality: N/A;Medtronic LinQ implanted by Dr Rayann Heman for palpitaitons and dizziness   . TEE WITHOUT CARDIOVERSION N/A 10/15/2014   Procedure: TRANSESOPHAGEAL ECHOCARDIOGRAM (TEE);  Surgeon: Thayer Headings, MD;  Location: [facility] ENDOSCOPY;  Service: Cardiovascular;  Laterality: N/A;    Current Outpatient Medications  Medication Sig Dispense Refill  . aspirin 81 MG EC tablet Take 81 mg by mouth daily.      Marland Kitchen atorvastatin (LIPITOR) 10 MG tablet Take 1/2 tablet (87m total) by mouth daily at 6pm 45 tablet 1  . Calcium Citrate-Vitamin D (CALCIUM + D PO) Take 1 tablet by mouth daily.    .Marland Kitchendofetilide (TIKOSYN) 250 MCG capsule Take 1 capsule (250 mcg total) by mouth 2 (two) times daily. 180 capsule 2  . furosemide (LASIX) 40 MG tablet Take 1 tablet (40 mg total) by mouth daily as needed for fluid. 30 tablet 5  . glucose blood test strip 1 each by Other route as needed for other. Use as instructed with One Touch glucometer. 100 each 11  . Magnesium Oxide 400 MG CAPS Take 1 capsule (400 mg total) by mouth daily. 90 capsule 3  . metoprolol tartrate (LOPRESSOR) 100 MG tablet Take 1 tablet (100 mg total) by mouth 2 (two) times daily. 180 tablet 3  . Multiple Vitamin (MULTIVITAMIN PO) Take 1 tablet by mouth daily.      . nitroGLYCERIN (NITROSTAT) 0.4 MG SL tablet Place 1 tablet (0.4 mg total) under the tongue every 5 (five) minutes x 3 doses as needed  for chest pain. 30 tablet 3  . potassium chloride (MICRO-K) 10 MEQ CR capsule Take 1 capsule (10 mEq total) by mouth daily as needed (for potassium repacement when taking lasix). 30 capsule 5  . ramipril (ALTACE) 10 MG capsule Take 2 capsules (20 mg total) by mouth daily. 180 capsule 3  . vitamin E 100 UNIT capsule Take 100 Units by mouth daily.      Marland Kitchen warfarin (COUMADIN) 2.5 MG tablet Take as directed by coumadin clinic. 100 tablet 1  . albuterol (PROVENTIL HFA;VENTOLIN HFA) 108 (90 Base) MCG/ACT inhaler Inhale 1-2 puffs into the lungs every 6 (six) hours as needed for wheezing or shortness of breath. (Patient not taking: Reported on 12/11/2017) 1  Inhaler 1  . Blood Glucose Monitoring Suppl (ONE TOUCH ULTRA SYSTEM KIT) w/Device KIT 1 kit by Does not apply route once. (Patient not taking: Reported on 12/11/2017) 1 each 0  . ondansetron (ZOFRAN) 4 MG tablet Take 1 tablet (4 mg total) by mouth every 8 (eight) hours as needed for nausea or vomiting. (Patient not taking: Reported on 12/11/2017) 10 tablet 0   No current facility-administered medications for this encounter.     Allergies  Allergen Reactions  . Simvastatin Rash    Social History   Socioeconomic History  . Marital status: Widowed    Spouse name: Not on file  . Number of children: Not on file  . Years of education: Not on file  . Highest education level: Not on file  Occupational History  . Not on file  Social Needs  . Financial resource strain: Not on file  . Food insecurity:    Worry: Not on file    Inability: Not on file  . Transportation needs:    Medical: Not on file    Non-medical: Not on file  Tobacco Use  . Smoking status: Former Smoker    Packs/day: 0.50    Years: 20.00    Pack years: 10.00    Types: Cigarettes    Last attempt to quit: 07/11/1996    Years since quitting: 21.4  . Smokeless tobacco: Never Used  Substance and Sexual Activity  . Alcohol use: No  . Drug use: No  . Sexual activity: Not on file  Lifestyle  . Physical activity:    Days per week: Not on file    Minutes per session: Not on file  . Stress: Not on file  Relationships  . Social connections:    Talks on phone: Not on file    Gets together: Not on file    Attends religious service: Not on file    Active member of club or organization: Not on file    Attends meetings of clubs or organizations: Not on file    Relationship status: Not on file  . Intimate partner violence:    Fear of current or ex partner: Not on file    Emotionally abused: Not on file    Physically abused: Not on file    Forced sexual activity: Not on file  Other Topics Concern  . Not on file  Social  History Narrative  . Not on file    Family History  Problem Relation Age of Onset  . Breast cancer Mother   . Heart disease Mother   . Diabetes Mother   . Alcohol abuse Father   . Breast cancer Sister   . Brain cancer Sister        cause of death at 23 years old  .  Alcohol abuse Brother   . Heart disease Brother   . Heart disease Brother   . Cancer Brother        unknown type  . Coronary artery disease Unknown   . Hypertension Unknown   . Heart attack Brother   . Heart attack Brother     ROS- All systems are reviewed and negative except as per the HPI above  Physical Exam: Vitals:   12/11/17 1108  BP: 124/68  Pulse: (!) 108  Weight: 87.3 kg  Height: 5' 5" (1.651 m)   Wt Readings from Last 3 Encounters:  12/11/17 87.3 kg  11/15/17 92.2 kg  09/04/17 92.1 kg    Labs: Lab Results  Component Value Date   NA 141 11/15/2017   K 4.6 11/15/2017   CL 102 11/15/2017   CO2 31 11/15/2017   GLUCOSE 90 11/15/2017   BUN 20 11/15/2017   CREATININE 0.79 11/15/2017   CALCIUM 9.7 11/15/2017   MG 2.0 06/19/2017   Lab Results  Component Value Date   INR 2.4 11/12/2017   Lab Results  Component Value Date   CHOL 144 03/20/2017   HDL 41 03/20/2017   LDLCALC 82 03/20/2017   TRIG 105 03/20/2017     GEN- The patient is well appearing, alert and oriented x 3 today.   Head- normocephalic, atraumatic Eyes-  Sclera clear, conjunctiva pink Ears- hearing intact Oropharynx- clear Neck- supple, no JVP Lymph- no cervical lymphadenopathy Lungs- Clear to ausculation bilaterally, normal work of breathing Heart- irregular rate and rhythm, no murmurs, rubs or gallops, PMI not laterally displaced GI- soft, NT, ND, + BS Extremities- no clubbing, cyanosis, or edema MS- no significant deformity or atrophy Skin- no rash or lesion Psych- euthymic mood, full affect Neuro- strength and sensation are intact  EKG- afib at 86 bpm, qrs int 104 ms, qtc 442 ms Epic records  reviewed   Assessment and Plan: 1. Afib She is in afib today but unaware She had 1.5 %  afib burden by Linq on last remote check prior to ERI She is unaware, feels well Continue Tikosyn 250 mcg bid Continue metoprolol at 100 mg bid Continue warfarin 2.5 mg qd for chadsvasc score of 6  F/u in one month   C. , [facility] [address] [place], [place] [address] [phone]

## 2017-12-25 ENCOUNTER — Ambulatory Visit: Payer: PPO | Admitting: *Deleted

## 2017-12-25 DIAGNOSIS — Z5181 Encounter for therapeutic drug level monitoring: Secondary | ICD-10-CM

## 2017-12-25 DIAGNOSIS — I4891 Unspecified atrial fibrillation: Secondary | ICD-10-CM

## 2017-12-25 LAB — POCT INR: INR: 2.7 (ref 2.0–3.0)

## 2017-12-25 NOTE — Patient Instructions (Signed)
Description   Continue same dosage 1 tablet daily except 1/2 tablet on Tuesdays.  Recheck in 6 weeks  Call if needed or any new medications or procedures [phone]

## 2018-01-08 ENCOUNTER — Other Ambulatory Visit: Payer: Self-pay | Admitting: Cardiovascular Disease

## 2018-01-09 ENCOUNTER — Telehealth: Payer: Self-pay

## 2018-01-09 NOTE — Telephone Encounter (Signed)
Spoke with patient regarding dofetilide request from [employer] and patient stated she is not out of medication and does not need a refill. I told her last refill we approved said she needed an office visit with Dr. Johney Frame. Patient stated she will be seeing [facility] Fib clinic Tuesday 01/14/2018 and she will get her refills from them when she needs them. She thanked me for calling.

## 2018-01-14 ENCOUNTER — Ambulatory Visit (HOSPITAL_COMMUNITY): Payer: PPO | Admitting: Nurse Practitioner

## 2018-01-14 ENCOUNTER — Other Ambulatory Visit (HOSPITAL_COMMUNITY): Payer: Self-pay | Admitting: *Deleted

## 2018-01-14 MED ORDER — DOFETILIDE 250 MCG PO CAPS: 250.0000 ug | ORAL_CAPSULE | Freq: Two times a day (BID) | ORAL | 2 refills | Status: DC

## 2018-01-28 ENCOUNTER — Encounter (HOSPITAL_COMMUNITY): Payer: Self-pay | Admitting: Nurse Practitioner

## 2018-01-28 ENCOUNTER — Ambulatory Visit (HOSPITAL_COMMUNITY)
Admission: RE | Admit: 2018-01-28 | Discharge: 2018-01-28 | Disposition: A | Discharge status: Home / Self Care | Payer: PPO | Source: Ambulatory Visit | Attending: Nurse Practitioner | Admitting: Nurse Practitioner

## 2018-01-28 VITALS — BP 116/62 | HR 56 | Ht 65.0 in | Wt 199.0 lb

## 2018-01-28 DIAGNOSIS — E785 Hyperlipidemia, unspecified: Secondary | ICD-10-CM | POA: Insufficient documentation

## 2018-01-28 DIAGNOSIS — Z7982 Long term (current) use of aspirin: Secondary | ICD-10-CM | POA: Diagnosis not present

## 2018-01-28 DIAGNOSIS — I4891 Unspecified atrial fibrillation: Secondary | ICD-10-CM | POA: Diagnosis not present

## 2018-01-28 DIAGNOSIS — K219 Gastro-esophageal reflux disease without esophagitis: Secondary | ICD-10-CM | POA: Diagnosis not present

## 2018-01-28 DIAGNOSIS — E119 Type 2 diabetes mellitus without complications: Secondary | ICD-10-CM | POA: Diagnosis not present

## 2018-01-28 DIAGNOSIS — I2510 Atherosclerotic heart disease of native coronary artery without angina pectoris: Secondary | ICD-10-CM | POA: Diagnosis not present

## 2018-01-28 DIAGNOSIS — Z87891 Personal history of nicotine dependence: Secondary | ICD-10-CM | POA: Diagnosis not present

## 2018-01-28 DIAGNOSIS — Z79899 Other long term (current) drug therapy: Secondary | ICD-10-CM | POA: Diagnosis not present

## 2018-01-28 DIAGNOSIS — I10 Essential (primary) hypertension: Secondary | ICD-10-CM | POA: Diagnosis not present

## 2018-01-28 DIAGNOSIS — Z9889 Other specified postprocedural states: Secondary | ICD-10-CM | POA: Diagnosis not present

## 2018-01-28 DIAGNOSIS — Z7901 Long term (current) use of anticoagulants: Secondary | ICD-10-CM | POA: Diagnosis not present

## 2018-01-28 LAB — BASIC METABOLIC PANEL
Anion gap: 9 (ref 5–15)
BUN: 10 mg/dL (ref 8–23)
CALCIUM: 10 mg/dL (ref 8.9–10.3)
CO2: 28 mmol/L (ref 22–32)
Chloride: 102 mmol/L (ref 98–111)
Creatinine, Ser: 0.77 mg/dL (ref 0.44–1.00)
Glucose, Bld: 104 mg/dL — ABNORMAL HIGH (ref 70–99)
POTASSIUM: 4.3 mmol/L (ref 3.5–5.1)
Sodium: 139 mmol/L (ref 135–145)

## 2018-01-28 LAB — MAGNESIUM: Magnesium: 2 mg/dL (ref 1.7–2.4)

## 2018-01-28 NOTE — Progress Notes (Signed)
Primary Care Physician: Eulas Post, MD Referring Physician: Dr. Arnette Schaumann is a 84 y.o. female  that presents for f/u in the afib clinic. She is on Tikosyn. She did have a Linq that came to end of battery in 2017. She feels afib occassionally, more so at night.  She feels well. Last carelink summary in December 2017 showed 21 AF episodes (1.5 % burden). No issues with anticoagulation.    Today, she denies symptoms of palpitations, chest pain, intermittent  shortness of breath, orthopnea, PND, lower extremity edema, dizziness, presyncope, syncope, or neurologic sequela. The patient is tolerating medications without difficulties and is otherwise without complaint today.   Past Medical History:  Diagnosis Date  . Anemia   . CAD 09/24/2008   AMI in 1998 tx with PCI to LAD;  myoview 1/12:  no ischemia, EF 45%  . CARDIOMYOPATHY 09/24/2008   ischemic;  echo 4/12:  EF 20-25%, mild MR, mod LAE, mod reduced RVSF, mod RVE, mild RAE, mild TR, PASP 40  . CAROTID BRUIT 09/24/2008  . DIABETES MELLITUS 09/24/2008  . Edema 09/24/2008  . GERD (gastroesophageal reflux disease)   . HYPERLIPIDEMIA 09/24/2008  . HYPERTENSION 09/24/2008  . MI 09/24/2008  . Osteopenia   . Persistent atrial fibrillation 09/24/2008   s/p DCCV x 4 in past;  Amiod d/c'd 2/2 abnormal PFTs;  Tikosyn load 6/12 with DCCV  . Tachycardia-bradycardia [facility])    Past Surgical History:  Procedure Laterality Date  . CARDIOVERSION N/A 02/03/2015   Procedure: CARDIOVERSION;  Surgeon: Dorothy Spark, MD;  Location: [place];  Service: Cardiovascular;  Laterality: N/A;  . ELECTROPHYSIOLOGIC STUDY N/A 10/15/2014   PVI and CTI ablation by Dr Rayann Heman  . HIP FRACTURE SURGERY  2006  . KNEE SURGERY  2008   broken knee  . LOOP RECORDER IMPLANT N/A 11/06/2012   Procedure: LOOP RECORDER IMPLANT;  Surgeon: Thompson Grayer, MD;  Location[facility] CATH LAB;  Service: Cardiovascular;  Laterality: N/A;Medtronic LinQ implanted by Dr Rayann Heman for  palpitaitons and dizziness  . TEE WITHOUT CARDIOVERSION N/A 10/15/2014   Procedure: TRANSESOPHAGEAL ECHOCARDIOGRAM (TEE);  Surgeon: Thayer Headings, MD;  Location: [facility] ENDOSCOPY;  Service: Cardiovascular;  Laterality: N/A;    Current Outpatient Medications  Medication Sig Dispense Refill  . aspirin 81 MG EC tablet Take 81 mg by mouth daily.      Marland Kitchen atorvastatin (LIPITOR) 10 MG tablet Take 1/2 tablet by mouth every day at 6pm 45 tablet 1  . Blood Glucose Monitoring Suppl (ONE TOUCH ULTRA SYSTEM KIT) w/Device KIT 1 kit by Does not apply route once. 1 each 0  . Calcium Citrate-Vitamin D (CALCIUM + D PO) Take 1 tablet by mouth daily.    Marland Kitchen dofetilide (TIKOSYN) 250 MCG capsule Take 1 capsule (250 mcg total) by mouth 2 (two) times daily. 180 capsule 2  . furosemide (LASIX) 40 MG tablet Take 1 tablet (40 mg total) by mouth daily as needed for fluid. 30 tablet 5  . glucose blood test strip 1 each by Other route as needed for other. Use as instructed with One Touch glucometer. 100 each 11  . Magnesium Oxide 400 MG CAPS Take 1 capsule (400 mg total) by mouth daily. 90 capsule 3  . metoprolol tartrate (LOPRESSOR) 100 MG tablet Take 1 tablet (100 mg total) by mouth 2 (two) times daily. 180 tablet 3  . Multiple Vitamin (MULTIVITAMIN PO) Take 1 tablet by mouth daily.      . potassium  chloride (MICRO-K) 10 MEQ CR capsule Take 1 capsule (10 mEq total) by mouth daily as needed (for potassium repacement when taking lasix). 30 capsule 5  . ramipril (ALTACE) 10 MG capsule Take 2 capsules (20 mg total) by mouth daily. 180 capsule 3  . vitamin E 100 UNIT capsule Take 100 Units by mouth daily.      Marland Kitchen warfarin (COUMADIN) 2.5 MG tablet Take as directed by coumadin clinic 100 tablet 1  . nitroGLYCERIN (NITROSTAT) 0.4 MG SL tablet Place 1 tablet (0.4 mg total) under the tongue every 5 (five) minutes x 3 doses as needed for chest pain. (Patient not taking: Reported on 01/28/2018) 30 tablet 3  . ondansetron (ZOFRAN) 4 MG tablet  Take 1 tablet (4 mg total) by mouth every 8 (eight) hours as needed for nausea or vomiting. (Patient not taking: Reported on 12/11/2017) 10 tablet 0   No current facility-administered medications for this encounter.     Allergies  Allergen Reactions  . Simvastatin Rash    Social History   Socioeconomic History  . Marital status: Widowed    Spouse name: Not on file  . Number of children: Not on file  . Years of education: Not on file  . Highest education level: Not on file  Occupational History  . Not on file  Social Needs  . Financial resource strain: Not on file  . Food insecurity:    Worry: Not on file    Inability: Not on file  . Transportation needs:    Medical: Not on file    Non-medical: Not on file  Tobacco Use  . Smoking status: Former Smoker    Packs/day: 0.50    Years: 20.00    Pack years: 10.00    Types: Cigarettes    Last attempt to quit: 07/11/1996    Years since quitting: 21.5  . Smokeless tobacco: Never Used  Substance and Sexual Activity  . Alcohol use: No  . Drug use: No  . Sexual activity: Not on file  Lifestyle  . Physical activity:    Days per week: Not on file    Minutes per session: Not on file  . Stress: Not on file  Relationships  . Social connections:    Talks on phone: Not on file    Gets together: Not on file    Attends religious service: Not on file    Active member of club or organization: Not on file    Attends meetings of clubs or organizations: Not on file    Relationship status: Not on file  . Intimate partner violence:    Fear of current or ex partner: Not on file    Emotionally abused: Not on file    Physically abused: Not on file    Forced sexual activity: Not on file  Other Topics Concern  . Not on file  Social History Narrative  . Not on file    Family History  Problem Relation Age of Onset  . Breast cancer Mother   . Heart disease Mother   . Diabetes Mother   . Alcohol abuse Father   . Breast cancer Sister     . Brain cancer Sister        cause of death at 34 years old  . Alcohol abuse Brother   . Heart disease Brother   . Heart disease Brother   . Cancer Brother        unknown type  . Coronary artery disease Unknown   .  Hypertension Unknown   . Heart attack Brother   . Heart attack Brother     ROS- All systems are reviewed and negative except as per the HPI above  Physical Exam: Vitals:   01/28/18 1103  BP: 116/62  Pulse: (!) 56  Weight: 90.3 kg  Height: '5\' 5"'  (1.651 m)   Wt Readings from Last 3 Encounters:  01/28/18 90.3 kg  12/11/17 87.3 kg  11/15/17 92.2 kg    Labs: Lab Results  Component Value Date   NA 141 11/15/2017   K 4.6 11/15/2017   CL 102 11/15/2017   CO2 31 11/15/2017   GLUCOSE 90 11/15/2017   BUN 20 11/15/2017   CREATININE 0.79 11/15/2017   CALCIUM 9.7 11/15/2017   MG 2.0 06/19/2017   Lab Results  Component Value Date   INR 2.7 12/25/2017   Lab Results  Component Value Date   CHOL 144 03/20/2017   HDL 41 03/20/2017   LDLCALC 82 03/20/2017   TRIG 105 03/20/2017     GEN- The patient is well appearing, alert and oriented x 3 today.   Head- normocephalic, atraumatic Eyes-  Sclera clear, conjunctiva pink Ears- hearing intact Oropharynx- clear Neck- supple, no JVP Lymph- no cervical lymphadenopathy Lungs- Clear to ausculation bilaterally, normal work of breathing Heart- irregular rate and rhythm, no murmurs, rubs or gallops, PMI not laterally displaced GI- soft, NT, ND, + BS Extremities- no clubbing, cyanosis, or edema MS- no significant deformity or atrophy Skin- no rash or lesion Psych- euthymic mood, full affect Neuro- strength and sensation are intact  EKG- Sinus brady at 56 bpm, PR int 188 ms, qrs int 112 ms, qtc 457 ms( stable)  Epic records reviewed   Assessment and Plan: 1. Afib  She is in SR today and feels well.  She has intermittent Afib, majority of time in SR Continue Tikosyn 250 mcg bid Continue metoprolol at 100 mg  bid Continue warfarin 2.5 mg qd for chadsvasc score of 6 Bmet/mag pending   F/u in February as scheduled with Dr. Lawrence Marseilles C. Masa Lubin, [facility] [address] [place], [place] [address] [phone]

## 2018-02-05 ENCOUNTER — Ambulatory Visit: Payer: PPO | Admitting: *Deleted

## 2018-02-05 DIAGNOSIS — I4891 Unspecified atrial fibrillation: Secondary | ICD-10-CM | POA: Diagnosis not present

## 2018-02-05 DIAGNOSIS — Z5181 Encounter for therapeutic drug level monitoring: Secondary | ICD-10-CM | POA: Diagnosis not present

## 2018-02-05 LAB — POCT INR: INR: 2.4 (ref 2.0–3.0)

## 2018-02-05 NOTE — Patient Instructions (Signed)
Description   Continue same dosage 1 tablet daily except 1/2 tablet on Tuesdays.  Recheck in 5 weeks due to Thanksgiving Holiday.  Call if needed or any new medications or procedures [phone]

## 2018-02-21 ENCOUNTER — Ambulatory Visit (INDEPENDENT_AMBULATORY_CARE_PROVIDER_SITE_OTHER): Payer: PPO

## 2018-02-21 DIAGNOSIS — Z23 Encounter for immunization: Secondary | ICD-10-CM | POA: Diagnosis not present

## 2018-03-07 ENCOUNTER — Other Ambulatory Visit: Payer: Self-pay | Admitting: *Deleted

## 2018-03-07 MED ORDER — METOPROLOL TARTRATE 100 MG PO TABS: 100.0000 mg | ORAL_TABLET | Freq: Two times a day (BID) | ORAL | 3 refills | Status: DC

## 2018-03-07 MED ORDER — RAMIPRIL 10 MG PO CAPS: 20.0000 mg | ORAL_CAPSULE | Freq: Every day | ORAL | 3 refills | Status: DC

## 2018-03-10 ENCOUNTER — Other Ambulatory Visit: Payer: Self-pay

## 2018-03-10 NOTE — Patient Outreach (Signed)
[facility]) Care Management  03/10/2018  NELINA SCHUTH 1930/01/09 [id number]  TELEPHONE SCREENING Referral date: 02/26/18 Referral source:  HTA referral Referral reason: medication assistance Insurance: HTA Attempt #1  Telephone call to patient regarding referral. Unable to reach patient. HIPAA compliant voice message left with call back phone number.   PLAN: RNCM will attempt 2nd telephone call to patient within 4 business days. RNCM will send outreach letter.   George Ina RN,BSN, CCM [facility] Telephonic  [phone]

## 2018-03-12 ENCOUNTER — Ambulatory Visit: Payer: PPO | Admitting: *Deleted

## 2018-03-12 DIAGNOSIS — I4891 Unspecified atrial fibrillation: Secondary | ICD-10-CM

## 2018-03-12 DIAGNOSIS — Z5181 Encounter for therapeutic drug level monitoring: Secondary | ICD-10-CM

## 2018-03-12 LAB — POCT INR: INR: 2.4 (ref 2.0–3.0)

## 2018-03-12 NOTE — Patient Instructions (Addendum)
Description   Continue same dosage 1 tablet daily except 1/2 tablet on Tuesdays.  Recheck in 6 weeks  Call if needed or any new medications or procedures [phone]     

## 2018-03-13 ENCOUNTER — Other Ambulatory Visit: Payer: Self-pay

## 2018-03-13 NOTE — Patient Outreach (Signed)
[facility]) Care Management  03/13/2018  [name] October 07, 1929 [id number]   TELEPHONE SCREENING Referral date: 02/26/18 Referral source:  HTA referral Referral reason: medication assistance Insurance: HTA  Telephone call to patient regarding HTA referral. HIPAA verified with patient. Explained reason for call. Patient states she is having difficulty affording her medication, Tikosyn.  Patient states the medication cost her $475.00 for a 3 month supply.  Patient states she has difficulty getting to her doctor appointments.  She states she has good family support but her family works during the day.   Patient states her daughter passed away in 09-13-17.  Patient states her daughter helped her with a lot.  RNCM discussed and offered [facility] care management services to patient.  Patient verbally agreed to pharmacy and social worker assistance. Patient declined services related to grief counseling. Patient states she has been handling her grief on her own.   PLAN: RNCM will refer patient to pharmacy for medication assistance and to social worker for transportation assistance.   George Ina RN,BSN,CCM [facility] Telephonic  [phone]

## 2018-03-13 NOTE — Patient Outreach (Signed)
[facility]) Care Management  [place]   03/13/2018  [name] [birth year] [id number]  Reason for referral: medication assistance   Referral source: [facility] RN Referral medication(s): dofetilide  Current insurance: HTA  PMHx: Hypertension, h/o MI, cardiomyopathy, atrial fibrillation, systolic heart failure, type 2 diabetes mellitus and hyperlipidemia.   HPI:  [name] is having trouble affording dofetilide.  It is costing her ~$500 for a 3 month supply.  Per her income she would qualify for Extra Help LIS, however she owns anther property which makes her ineligible.   Objective: Allergies  Allergen Reactions  . Simvastatin Rash    Medications Reviewed Today    Reviewed by Dionne Milo, [facility] (Pharmacist) on 03/13/18 at 1510  Med List Status: <None>  Medication Order Taking? Sig Documenting Provider Last Dose Status Informant  aspirin 81 MG EC tablet [id number] Yes Take 81 mg by mouth daily.   [provider] Taking Active Self  atorvastatin (LIPITOR) 10 MG tablet [id number] Yes Take 1/2 tablet by mouth every day at 6pm  Patient taking differently:  Take 5 mg by mouth daily at 6 PM.    Josue Hector, MD Taking Active   Blood Glucose Monitoring Suppl (ONE TOUCH ULTRA SYSTEM KIT) w/Device KIT [id number] Yes 1 kit by Does not apply route once. Eulas Post, MD Taking Active Self  Calcium Citrate-Vitamin D (CALCIUM + D PO) [id number] Yes Take 1 tablet by mouth daily. [provider] Taking Active Self  dofetilide (TIKOSYN) 250 MCG capsule [id number] Yes Take 1 capsule (250 mcg total) by mouth 2 (two) times daily. Sherran Needs, NP Taking Active   furosemide (LASIX) 40 MG tablet [id number] Yes Take 1 tablet (40 mg total) by mouth daily as needed for fluid. Eulas Post, MD Taking Active   glucose blood test strip [id number] Yes 1 each by Other route as needed for other. Use as instructed with One Touch glucometer. Eulas Post, MD Taking Active Self  Magnesium Oxide 400 MG CAPS [id number] Yes Take 1 capsule (400 mg total) by mouth daily. Isaiah Serge, NP Taking Active   metoprolol tartrate (LOPRESSOR) 100 MG tablet [id number] Yes Take 1 tablet (100 mg total) by mouth 2 (two) times daily. Isaiah Serge, NP Taking Active   Multiple Vitamin (MULTIVITAMIN PO) [id number] Yes Take 1 tablet by mouth daily.   [provider] Taking Active Self  nitroGLYCERIN (NITROSTAT) 0.4 MG SL tablet [id number] No Place 1 tablet (0.4 mg total) under the tongue every 5 (five) minutes x 3 doses as needed for chest pain.  Patient not taking:  Reported on 01/28/2018   Isaiah Serge, NP Not Taking Active   ondansetron [facility]) 4 MG tablet [id number] No Take 1 tablet (4 mg total) by mouth every 8 (eight) hours as needed for nausea or vomiting.  Patient not taking:  Reported on 12/11/2017   Nat Christen, MD Not Taking Active   potassium chloride (MICRO-K) 10 MEQ CR capsule [id number] Yes Take 1 capsule (10 mEq total) by mouth daily as needed (for potassium repacement when taking lasix). Eulas Post, MD Taking Active   ramipril (ALTACE) 10 MG capsule [id number] Yes Take 2 capsules (20 mg total) by mouth daily. Isaiah Serge, NP Taking Active   vitamin E 100 UNIT capsule [id number] Yes Take 100 Units by mouth daily.   [provider] Taking Active Self  warfarin (COUMADIN) 2.5 MG tablet [id number] Yes Take as directed by  coumadin clinic Josue Hector, MD Taking Active           Assessment:  Drugs sorted by system:  Cardiovascular: aspirin, atorvastatin, dofetilide, furosemide,   metoprolol tartrate, nitroglycerin, potassium chloride, ramipril, warfarin   Gastrointestinal: ondansetron  Vitamins/Minerals/Supplements: calcium with D, magnesium oxide, MVI, vitamin E  Medication Assistance Findings:  Extra Help:   '[]'  Already receiving Full Extra Help  '[]'  Already receiving Partial Extra Help  '[]'  Eligible based on  reported income and assets  '[x]'  Not Eligible based on reported income and assets  Patient Assistance Programs: Dofetilide- patient had a tier exception earlier this year.   She is now in the coverage gap.  No assistance program available with [employer] next year.    Additional medication assistance options reviewed with patient as warranted:   RX Outreach- Consider applying. According to website  rxoutreach.org, the 90 day cash price for Dofetilide 250 mcg is $124.    Plan: Follow up with [name] to see if she is interested in proceeding with Rx Outreach.  If agreeable, will schedule a home visit and help her complete the forms.   Outreach to [employer] at her cardiologist's office to discuss getting a prescription to send with application.  Joetta Manners, PharmD Clinical Pharmacist [place] [phone]

## 2018-03-13 NOTE — Telephone Encounter (Signed)
This encounter was created in error - please disregard.

## 2018-03-17 ENCOUNTER — Ambulatory Visit: Payer: Self-pay

## 2018-03-17 ENCOUNTER — Other Ambulatory Visit: Payer: Self-pay

## 2018-03-17 NOTE — Patient Outreach (Addendum)
[facility]) Care Management  03/17/2018  [name] 09/29/1929 [id number]  Successful outreach attempt to [name].  HIPAA identifiers verified.  Medication Assistance: Spoke to [name] to see if she would like to proceed with the application for Rx Outreach, so she can get dofetilide cheaper in 2020.  She states that she would like me to speak with her granddaughter [place] on Wednesday when she is off work.    Of note, [name], LPN with [facility] states that they are also referring patients to Rx Outreach for dofetilide assistance next year.   Plan: Outreach to granddaughter, Lorre Nick @ [phone] on Wednesday.  Berlin Hun, PharmD Clinical Pharmacist [facility] [phone]

## 2018-03-19 ENCOUNTER — Other Ambulatory Visit: Payer: Self-pay

## 2018-03-19 ENCOUNTER — Ambulatory Visit: Payer: Self-pay

## 2018-03-19 NOTE — Patient Outreach (Addendum)
[facility]) Care Management  03/19/2018  [name] 05-28-1929 [id number]  Successful outreach call to Ms. Born's granddaughter, [name].  HIPAA identifiers verified.   Medication Assistance: Discussed having [name] apply for [name]'s dofetilide from [email or web address].  This non-profit pharmacy charges [phone] for a 90 day supply of her dofetilide verses ~$500, which is her price now that she is in the donut hole.  [name] states that she will complete the online application for her grandmother and help order her refills.  I requested that she call me when she has completed the application and I will contact her cardiologist's office and request that they fax in her prescription.  She verbalized understanding.   Plan: Await for [name] to complete online application.  Outreach to [employer] Via at [employer] to get prescription submitted to [email or web address].   Berlin Hun, PharmD Clinical Pharmacist [facility] [phone]

## 2018-03-24 ENCOUNTER — Other Ambulatory Visit: Payer: Self-pay

## 2018-03-24 NOTE — Patient Outreach (Signed)
[facility]) Care Management  03/24/2018  [name] [birth year] [id number]  BSW attempted to contact the patient on today's date to conduct a community resource consult. Unfortunately, today's call was unsuccessful. BSW left the patient a HIPAA compliant voice message requesting a return call.   Plan: BSW will mail the patient an unsuccessful outreach letter. BSW will attempt the patient again within the next four business days.  Bevelyn Ngo, BSW, CDP [facility] [phone]

## 2018-03-27 ENCOUNTER — Other Ambulatory Visit: Payer: Self-pay

## 2018-03-27 NOTE — Patient Outreach (Signed)
[facility]) Care Management  03/27/2018  [name] Sep 03, 1929 [id number]  Successful outreach to the patient on today's date, HIPAA identifiers confirmed. BSW introduced self to the patient and the reason for today's call, indicating the patient had been referred for assistance with transportation resources. The patient reports she currently utilizes her neighbor for transportation to physician appointments but it is not always a reliable resource.  BSW discussed options available to serve the patients home address including [employer] and [facility]. BSW informed the patient of details for each program. The patient has decided she would only like to pursue [employer] at this time.The patient denies any urgent transportation needs during today's call.  BSW to mail the patient a [employer]. BSW explained to the patient that once completed, she is to mail the application to [employer]. The patient stated understanding.   Plan: BSW to contact the patient within the next four weeks to confirm receipt of mailed resource and assist as needed.  Bevelyn Ngo, BSW, CDP [facility] [phone]

## 2018-03-31 ENCOUNTER — Ambulatory Visit: Payer: Self-pay

## 2018-03-31 ENCOUNTER — Other Ambulatory Visit: Payer: Self-pay

## 2018-03-31 NOTE — Patient Outreach (Signed)
[facility]) Care Management  [facility]  03/31/2018  [name] 26-Jan-1930 [id number]  Unsuccessful telephone call attempt # 1  to [name] to see if she had anymore questions about the online application for Rx Outreach.  Her granddaughter had agreed to assist with her application.    HIPAA compliant voicemail left requesting a return call.  Plan:  I will make another outreach attempt to patient within 3-4 business days.  Berlin Hun, PharmD Clinical Pharmacist [facility] [phone]

## 2018-04-03 ENCOUNTER — Other Ambulatory Visit: Payer: Self-pay

## 2018-04-03 ENCOUNTER — Ambulatory Visit: Payer: Self-pay

## 2018-04-03 NOTE — Patient Outreach (Signed)
[facility]) Care Management  [facility]  04/03/2018  SHITAL DEWAARD 1929-08-27 [id number]  Unsuccessful telephone call attempt # 2  to [name] to see if she had anymore questions about the online application for Rx Outreach.  Her granddaughter had agreed to assist with her application.    HIPAA compliant voicemail left requesting a return call.  Plan:  I will make another outreach attempt to patient within 3-4 business days.  Berlin Hun, PharmD Clinical Pharmacist [facility] [phone]

## 2018-04-18 ENCOUNTER — Other Ambulatory Visit: Payer: Self-pay

## 2018-04-18 NOTE — Patient Outreach (Signed)
[facility]) Care Management  04/18/2018  [name] 14-Nov-1929 [id number]  Successful outreach on today's date, HIPAA identifiers confirmed. The patient is able to confirm receipt of mailed resource. The patient indicates she has completed the senior [employer] application but has yet to submit it. The patient states her grandson brought her an envelope and stamp over the Christmas holiday and she plans to send in as soon as she feels better from a cold she currently has.   BSW discussed discipline closure with the patient as no other social work needs have been identified. The patient is in agreement with this plan. The patient acknowledges she has not returned [facility] pharmacist call due to feeling bad. The patient states she does have the pharmacists contact information and plans to call once she feels better. BSW will alert pharmacist of today's discipline closure.  Bevelyn Ngo, BSW, CDP [facility] [phone]

## 2018-04-24 ENCOUNTER — Other Ambulatory Visit: Payer: Self-pay

## 2018-04-24 ENCOUNTER — Ambulatory Visit: Payer: PPO | Admitting: Pharmacist

## 2018-04-24 DIAGNOSIS — Z5181 Encounter for therapeutic drug level monitoring: Secondary | ICD-10-CM

## 2018-04-24 DIAGNOSIS — I4891 Unspecified atrial fibrillation: Secondary | ICD-10-CM

## 2018-04-24 LAB — POCT INR: INR: 3.4 — AB (ref 2.0–3.0)

## 2018-04-24 NOTE — Patient Outreach (Signed)
[facility]) Care Management [place]  04/24/2018  MAHALIE KANNER 1930-01-22 [id number]  Reason for referral: medication assistance for dofetilide  [employer] pharmacy case is being closed due to the following reasons:  Goals have been met.  Patient's granddaughter has enrolled her with [email or web address], which will decrease the co-pays for dofetilide.  She will assist with online ordering of this medication for her grandmother.  Patient has been provided [facility] CM contact information if assistance needed in the future.    Thank you for allowing [facility] pharmacy to be involved in this patient's care.    Joetta Manners, PharmD Clinical Pharmacist [place] [phone]

## 2018-04-24 NOTE — Patient Instructions (Signed)
Description   Do not take Coumadin tomorrow. Then, continue same dosage 1 tablet daily except 1/2 tablet on Tuesdays.  Recheck in 4 weeks.  Call if needed or any new medications or procedures [phone]

## 2018-05-21 ENCOUNTER — Ambulatory Visit: Payer: PPO | Admitting: Pharmacist

## 2018-05-21 DIAGNOSIS — Z5181 Encounter for therapeutic drug level monitoring: Secondary | ICD-10-CM | POA: Diagnosis not present

## 2018-05-21 DIAGNOSIS — I4891 Unspecified atrial fibrillation: Secondary | ICD-10-CM | POA: Diagnosis not present

## 2018-05-21 LAB — POCT INR: INR: 3.1 — AB (ref 2.0–3.0)

## 2018-05-21 NOTE — Patient Instructions (Addendum)
Description   Take 1/2 tablet of Coumadin tomorrow, then continue same dosage 1 tablet daily except 1/2 tablet on Tuesdays.  Stay consistent with your greens. Recheck in 5 weeks - same day as appt with Dr Johney Frame.  Call if needed or any new medications or procedures [phone]

## 2018-05-23 ENCOUNTER — Other Ambulatory Visit: Payer: Self-pay

## 2018-05-23 MED ORDER — WARFARIN SODIUM 2.5 MG PO TABS: ORAL_TABLET | ORAL | 1 refills | Status: DC

## 2018-06-25 ENCOUNTER — Ambulatory Visit (INDEPENDENT_AMBULATORY_CARE_PROVIDER_SITE_OTHER): Payer: PPO | Admitting: Pharmacist

## 2018-06-25 ENCOUNTER — Ambulatory Visit: Payer: PPO | Admitting: Internal Medicine

## 2018-06-25 ENCOUNTER — Encounter: Payer: Self-pay | Admitting: Internal Medicine

## 2018-06-25 VITALS — BP 122/68 | HR 129 | Ht 65.0 in | Wt 198.0 lb

## 2018-06-25 DIAGNOSIS — I110 Hypertensive heart disease with heart failure: Secondary | ICD-10-CM

## 2018-06-25 DIAGNOSIS — Z5181 Encounter for therapeutic drug level monitoring: Secondary | ICD-10-CM | POA: Diagnosis not present

## 2018-06-25 DIAGNOSIS — I484 Atypical atrial flutter: Secondary | ICD-10-CM

## 2018-06-25 DIAGNOSIS — I4891 Unspecified atrial fibrillation: Secondary | ICD-10-CM | POA: Diagnosis not present

## 2018-06-25 LAB — POCT INR: INR: 2.8 (ref 2.0–3.0)

## 2018-06-25 NOTE — H&P (View-Only) (Signed)
 PCP: [name], Bruce W, MD Primary Cardiologist: Dr Nishan Primary EP: Dr Aleea Hendry  [name] is a 85 y.o. female who presents today for routine electrophysiology followup.  Since last being seen in our clinic, the patient reports doing reasonably well.  She is in afib today but unaware.  Her SOB is stable.  Today, she denies symptoms of palpitations, chest pain, lower extremity edema, dizziness, presyncope, or syncope.  The patient is otherwise without complaint today.   Past Medical History:  Diagnosis Date  . Anemia   . CAD 09/24/2008   AMI in 1998 tx with PCI to LAD;  myoview 1/12:  no ischemia, EF 45%  . CARDIOMYOPATHY 09/24/2008   ischemic;  echo 4/12:  EF 20-25%, mild MR, mod LAE, mod reduced RVSF, mod RVE, mild RAE, mild TR, PASP 40  . CAROTID BRUIT 09/24/2008  . DIABETES MELLITUS 09/24/2008  . Edema 09/24/2008  . GERD (gastroesophageal reflux disease)   . HYPERLIPIDEMIA 09/24/2008  . HYPERTENSION 09/24/2008  . MI 09/24/2008  . Osteopenia   . Persistent atrial fibrillation 09/24/2008   s/p DCCV x 4 in past;  Amiod d/c'd 2/2 abnormal PFTs;  Tikosyn load 6/12 with DCCV  . Tachycardia-bradycardia (HCC)    Past Surgical History:  Procedure Laterality Date  . CARDIOVERSION N/A 02/03/2015   Procedure: CARDIOVERSION;  Surgeon: Katarina H Nelson, MD;  Location: [facility] END[facility];  Service: Cardiovascular;  Laterality: N/A;  . ELECTROPHYSIOLOGIC STUDY N/A 10/15/2014   PVI and CTI ablation by Dr Aldric Wenzler  . HIP FRACTURE SURGERY  2006  . KNEE SURGERY  2008   broken knee  . LOOP RECORDER IMPLANT N/A 11/06/2012   Procedure: LOOP RECORDER IMPLANT;  Surgeon: Ceci Taliaferro, MD;  Location: [facility] CATH LAB;  Service: Cardiovascular;  Laterality: N/A;Medtronic LinQ implanted by Dr Ebany Bowermaster for palpitaitons and dizziness  . TEE WITHOUT CARDIOVERSION N/A 10/15/2014   Procedure: TRANSESOPHAGEAL ECHOCARDIOGRAM (TEE);  Surgeon: Philip J Nahser, MD;  Location: [facility] ENDOS[facility];  Service: Cardiovascular;  Laterality: N/A;     ROS- all systems are reviewed and negatives except as per HPI above  Current Outpatient Medications  Medication Sig Dispense Refill  . aspirin 81 MG EC tablet Take 81 mg by mouth daily.      . atorvastatin (LIPITOR) 10 MG tablet Take 1/2 tablet by mouth every day at 6pm (Patient taking differently: Take 5 mg by mouth daily at 6 PM. ) 45 tablet 1  . Blood Glucose Monitoring Suppl (ONE TOUCH ULTRA SYSTEM KIT) w/Device KIT 1 kit by Does not apply route once. 1 each 0  . Calcium Citrate-Vitamin D (CALCIUM + D PO) Take 1 tablet by mouth daily.    . dofetilide (TIKOSYN) 250 MCG capsule Take 1 capsule (250 mcg total) by mouth 2 (two) times daily. 180 capsule 2  . furosemide (LASIX) 40 MG tablet Take 1 tablet (40 mg total) by mouth daily as needed for fluid. 30 tablet 5  . glucose blood test strip 1 each by Other route as needed for other. Use as instructed with One Touch glucometer. 100 each 11  . Magnesium Oxide 400 MG CAPS Take 1 capsule (400 mg total) by mouth daily. 90 capsule 3  . metoprolol tartrate (LOPRESSOR) 100 MG tablet Take 1 tablet (100 mg total) by mouth 2 (two) times daily. 180 tablet 3  . Multiple Vitamin (MULTIVITAMIN PO) Take 1 tablet by mouth daily.      . nitroGLYCERIN (NITROSTAT) 0.4 MG SL tablet Place 1 tablet (0.4 mg   total) under the tongue every 5 (five) minutes x 3 doses as needed for chest pain. 30 tablet 3  . ondansetron (ZOFRAN) 4 MG tablet Take 1 tablet (4 mg total) by mouth every 8 (eight) hours as needed for nausea or vomiting. 10 tablet 0  . potassium chloride (MICRO-K) 10 MEQ CR capsule Take 1 capsule (10 mEq total) by mouth daily as needed (for potassium repacement when taking lasix). 30 capsule 5  . ramipril (ALTACE) 10 MG capsule Take 2 capsules (20 mg total) by mouth daily. 180 capsule 3  . vitamin E 100 UNIT capsule Take 100 Units by mouth daily.      . warfarin (COUMADIN) 2.5 MG tablet Take as directed by Coumadin Clinic 100 tablet 1   No current  facility-administered medications for this visit.     Physical Exam: Vitals:   06/25/18 1127  BP: 122/68  Pulse: (!) 129  SpO2: 95%  Weight: 198 lb (89.8 kg)  Height: 5' 5" (1.651 m)    GEN- The patient is well appearing, alert and oriented x 3 today.   Head- normocephalic, atraumatic Eyes-  Sclera clear, conjunctiva pink Ears- hearing intact Oropharynx- clear Lungs- Clear to ausculation bilaterally, normal work of breathing Heart- tachycardic irregular rhythm, no murmurs, rubs or gallops, PMI not laterally displaced GI- soft, NT, ND, + BS Extremities- no clubbing, cyanosis, or edema  Wt Readings from Last 3 Encounters:  06/25/18 198 lb (89.8 kg)  01/28/18 199 lb (90.3 kg)  12/11/17 192 lb 6.4 oz (87.3 kg)    EKG tracing ordered today is personally reviewed and shows afib, V rates 129 bpm, IVCD  Assessment and Plan:  1. Atypical atrial flutter/ afib- persistent V rates are elevated today.  We are limited in our ability to further rate control. chads2vasc score is 7.  She is on coumadin Return to AF clinic in 1 week.  If still in AF at that time, may need to consider cardioversion.  Bmet, mg to be obtained in AF clinic.  2. HTN Stable No change required today  3. Chronic systolic dysfunction Stable No change required today  AF clinic in 1 week to see if her AF is persistent  Kennidee Heyne MD, FACC 06/25/2018 12:23 PM   

## 2018-06-25 NOTE — Patient Instructions (Signed)
Description   Continue same dosage 1 tablet daily except 1/2 tablet on Tuesdays.  Stay consistent with your greens. Recheck in 4 weeks.  Call if needed or any new medications or procedures [phone]

## 2018-06-25 NOTE — Patient Instructions (Addendum)
Medication Instructions:  Your physician recommends that you continue on your current medications as directed. Please refer to the Current Medication list given to you today.  Labwork: None ordered.  Testing/Procedures: None ordered.  Follow-Up: Your physician wants you to follow-up in: AFIB clinic in one week.  Any Other Special Instructions Will Be Listed Below (If Applicable).  If you need a refill on your cardiac medications before your next appointment, please call your pharmacy.

## 2018-06-25 NOTE — Progress Notes (Signed)
PCP: Eulas Post, MD Primary Cardiologist: Dr Johnsie Cancel Primary EP: Dr Rayann Heman  [name] is a 85 y.o. female who presents today for routine electrophysiology followup.  Since last being seen in our clinic, the patient reports doing reasonably well.  She is in afib today but unaware.  Her SOB is stable.  Today, she denies symptoms of palpitations, chest pain, lower extremity edema, dizziness, presyncope, or syncope.  The patient is otherwise without complaint today.   Past Medical History:  Diagnosis Date  . Anemia   . CAD 09/24/2008   AMI in 1998 tx with PCI to LAD;  myoview 1/12:  no ischemia, EF 45%  . CARDIOMYOPATHY 09/24/2008   ischemic;  echo 4/12:  EF 20-25%, mild MR, mod LAE, mod reduced RVSF, mod RVE, mild RAE, mild TR, PASP 40  . CAROTID BRUIT 09/24/2008  . DIABETES MELLITUS 09/24/2008  . Edema 09/24/2008  . GERD (gastroesophageal reflux disease)   . HYPERLIPIDEMIA 09/24/2008  . HYPERTENSION 09/24/2008  . MI 09/24/2008  . Osteopenia   . Persistent atrial fibrillation 09/24/2008   s/p DCCV x 4 in past;  Amiod d/c'd 2/2 abnormal PFTs;  Tikosyn load 6/12 with DCCV  . Tachycardia-bradycardia [facility])    Past Surgical History:  Procedure Laterality Date  . CARDIOVERSION N/A 02/03/2015   Procedure: CARDIOVERSION;  Surgeon: Dorothy Spark, MD;  Location: [place];  Service: Cardiovascular;  Laterality: N/A;  . ELECTROPHYSIOLOGIC STUDY N/A 10/15/2014   PVI and CTI ablation by Dr Rayann Heman  . HIP FRACTURE SURGERY  2006  . KNEE SURGERY  2008   broken knee  . LOOP RECORDER IMPLANT N/A 11/06/2012   Procedure: LOOP RECORDER IMPLANT;  Surgeon: Thompson Grayer, MD;  [facility] CATH LAB;  Service: Cardiovascular;  Laterality: N/A;Medtronic LinQ implanted by Dr Rayann Heman for palpitaitons and dizziness  . TEE WITHOUT CARDIOVERSION N/A 10/15/2014   Procedure: TRANSESOPHAGEAL ECHOCARDIOGRAM (TEE);  Surgeon: Thayer Headings, MD;  Location: [place];  Service: Cardiovascular;  Laterality: N/A;     ROS- all systems are reviewed and negatives except as per HPI above  Current Outpatient Medications  Medication Sig Dispense Refill  . aspirin 81 MG EC tablet Take 81 mg by mouth daily.      Marland Kitchen atorvastatin (LIPITOR) 10 MG tablet Take 1/2 tablet by mouth every day at 6pm (Patient taking differently: Take 5 mg by mouth daily at 6 PM. ) 45 tablet 1  . Blood Glucose Monitoring Suppl (ONE TOUCH ULTRA SYSTEM KIT) w/Device KIT 1 kit by Does not apply route once. 1 each 0  . Calcium Citrate-Vitamin D (CALCIUM + D PO) Take 1 tablet by mouth daily.    Marland Kitchen dofetilide (TIKOSYN) 250 MCG capsule Take 1 capsule (250 mcg total) by mouth 2 (two) times daily. 180 capsule 2  . furosemide (LASIX) 40 MG tablet Take 1 tablet (40 mg total) by mouth daily as needed for fluid. 30 tablet 5  . glucose blood test strip 1 each by Other route as needed for other. Use as instructed with One Touch glucometer. 100 each 11  . Magnesium Oxide 400 MG CAPS Take 1 capsule (400 mg total) by mouth daily. 90 capsule 3  . metoprolol tartrate (LOPRESSOR) 100 MG tablet Take 1 tablet (100 mg total) by mouth 2 (two) times daily. 180 tablet 3  . Multiple Vitamin (MULTIVITAMIN PO) Take 1 tablet by mouth daily.      . nitroGLYCERIN (NITROSTAT) 0.4 MG SL tablet Place 1 tablet (0.4 mg  total) under the tongue every 5 (five) minutes x 3 doses as needed for chest pain. 30 tablet 3  . ondansetron (ZOFRAN) 4 MG tablet Take 1 tablet (4 mg total) by mouth every 8 (eight) hours as needed for nausea or vomiting. 10 tablet 0  . potassium chloride (MICRO-K) 10 MEQ CR capsule Take 1 capsule (10 mEq total) by mouth daily as needed (for potassium repacement when taking lasix). 30 capsule 5  . ramipril (ALTACE) 10 MG capsule Take 2 capsules (20 mg total) by mouth daily. 180 capsule 3  . vitamin E 100 UNIT capsule Take 100 Units by mouth daily.      Marland Kitchen warfarin (COUMADIN) 2.5 MG tablet Take as directed by Coumadin Clinic 100 tablet 1   No current  facility-administered medications for this visit.     Physical Exam: Vitals:   06/25/18 1127  BP: 122/68  Pulse: (!) 129  SpO2: 95%  Weight: 198 lb (89.8 kg)  Height: _0  (1.651 m)    GEN- The patient is well appearing, alert and oriented x 3 today.   Head- normocephalic, atraumatic Eyes-  Sclera clear, conjunctiva pink Ears- hearing intact Oropharynx- clear Lungs- Clear to ausculation bilaterally, normal work of breathing Heart- tachycardic irregular rhythm, no murmurs, rubs or gallops, PMI not laterally displaced GI- soft, NT, ND, + BS Extremities- no clubbing, cyanosis, or edema  Wt Readings from Last 3 Encounters:  06/25/18 198 lb (89.8 kg)  01/28/18 199 lb (90.3 kg)  12/11/17 192 lb 6.4 oz (87.3 kg)    EKG tracing ordered today is personally reviewed and shows afib, V rates 129 bpm, IVCD  Assessment and Plan:  1. Atypical atrial flutter/ afib- persistent V rates are elevated today.  We are limited in our ability to further rate control. chads2vasc score is 7.  She is on coumadin Return to AF clinic in 1 week.  If still in AF at that time, may need to consider cardioversion.  Bmet, mg to be obtained in AF clinic.  2. HTN Stable No change required today  3. Chronic systolic dysfunction Stable No change required today  AF clinic in 1 week to see if her AF is persistent  Thompson Grayer MD, [facility] 06/25/2018 12:23 PM

## 2018-07-02 ENCOUNTER — Encounter (HOSPITAL_COMMUNITY): Payer: Self-pay | Admitting: Physician Assistant

## 2018-07-02 ENCOUNTER — Other Ambulatory Visit: Payer: Self-pay

## 2018-07-02 ENCOUNTER — Ambulatory Visit (HOSPITAL_COMMUNITY)
Admission: RE | Admit: 2018-07-02 | Discharge: 2018-07-02 | Disposition: A | Discharge status: Home / Self Care | Payer: PPO | Source: Ambulatory Visit | Attending: Physician Assistant | Admitting: Physician Assistant

## 2018-07-02 VITALS — BP 124/76 | HR 100 | Ht 65.0 in | Wt 199.0 lb

## 2018-07-02 DIAGNOSIS — I4819 Other persistent atrial fibrillation: Secondary | ICD-10-CM | POA: Insufficient documentation

## 2018-07-02 DIAGNOSIS — D649 Anemia, unspecified: Secondary | ICD-10-CM | POA: Insufficient documentation

## 2018-07-02 DIAGNOSIS — I2510 Atherosclerotic heart disease of native coronary artery without angina pectoris: Secondary | ICD-10-CM | POA: Insufficient documentation

## 2018-07-02 DIAGNOSIS — I4892 Unspecified atrial flutter: Secondary | ICD-10-CM | POA: Insufficient documentation

## 2018-07-02 DIAGNOSIS — Z87891 Personal history of nicotine dependence: Secondary | ICD-10-CM | POA: Diagnosis not present

## 2018-07-02 DIAGNOSIS — Z79899 Other long term (current) drug therapy: Secondary | ICD-10-CM | POA: Diagnosis not present

## 2018-07-02 DIAGNOSIS — E118 Type 2 diabetes mellitus with unspecified complications: Secondary | ICD-10-CM | POA: Insufficient documentation

## 2018-07-02 DIAGNOSIS — I10 Essential (primary) hypertension: Secondary | ICD-10-CM | POA: Insufficient documentation

## 2018-07-02 DIAGNOSIS — E785 Hyperlipidemia, unspecified: Secondary | ICD-10-CM | POA: Diagnosis not present

## 2018-07-02 DIAGNOSIS — M8580 Other specified disorders of bone density and structure, unspecified site: Secondary | ICD-10-CM | POA: Insufficient documentation

## 2018-07-02 DIAGNOSIS — Z7901 Long term (current) use of anticoagulants: Secondary | ICD-10-CM | POA: Diagnosis not present

## 2018-07-02 DIAGNOSIS — I484 Atypical atrial flutter: Secondary | ICD-10-CM | POA: Diagnosis not present

## 2018-07-02 DIAGNOSIS — Z7982 Long term (current) use of aspirin: Secondary | ICD-10-CM | POA: Diagnosis not present

## 2018-07-02 DIAGNOSIS — R5383 Other fatigue: Secondary | ICD-10-CM | POA: Diagnosis not present

## 2018-07-02 LAB — BASIC METABOLIC PANEL
Anion gap: 8 (ref 5–15)
BUN: 23 mg/dL (ref 8–23)
CALCIUM: 10 mg/dL (ref 8.9–10.3)
CHLORIDE: 103 mmol/L (ref 98–111)
CO2: 30 mmol/L (ref 22–32)
Creatinine, Ser: 0.91 mg/dL (ref 0.44–1.00)
GFR calc Af Amer: >60 mL/min (ref >60)
GFR calc non Af Amer: 57 mL/min — ABNORMAL LOW (ref >60)
Glucose, Bld: 105 mg/dL — ABNORMAL HIGH (ref 70–99)
POTASSIUM: 4.7 mmol/L (ref 3.5–5.1)
SODIUM: 141 mmol/L (ref 135–145)

## 2018-07-02 LAB — MAGNESIUM: Magnesium: 1.9 mg/dL (ref 1.7–2.4)

## 2018-07-02 LAB — CBC
HCT: 45 % (ref 36.0–46.0)
HEMOGLOBIN: 14.1 g/dL (ref 12.0–15.0)
MCH: 29.8 pg (ref 26.0–34.0)
MCHC: 31.3 g/dL (ref 30.0–36.0)
MCV: 95.1 fL (ref 80.0–100.0)
Platelets: 285 10*3/uL (ref 150–400)
RBC: 4.73 MIL/uL (ref 3.87–5.11)
RDW: 14.3 % (ref 11.5–15.5)
WBC: 8.5 10*3/uL (ref 4.0–10.5)
nRBC: 0 % (ref 0.0–0.2)

## 2018-07-02 LAB — PROTIME-INR
INR: 2.4 — AB (ref 0.8–1.2)
PROTHROMBIN TIME: 25.4 s — AB (ref 11.4–15.2)

## 2018-07-02 NOTE — Progress Notes (Signed)
Primary Care Physician: Eulas Post, MD  Primary cardiologist: Dr Johnsie Cancel Primary EP: Dr Rayann Heman Referring Physician: Dr. Arnette Schaumann is a 85 y.o. female  that presents for f/u in the afib clinic. She is on Tikosyn. She did have a Linq that came to end of battery in 2017. Patient continues to have more fatigue over the last few weeks. Her SOB is stable, no orthopnea or PND. She is in atrial flutter today.  Today, she denies symptoms of palpitations, chest pain, orthopnea, PND, lower extremity edema, dizziness, presyncope, syncope, or neurologic sequela. The patient is tolerating medications without difficulties and is otherwise without complaint today.   Past Medical History:  Diagnosis Date  . Anemia   . CAD 09/24/2008   AMI in 1998 tx with PCI to LAD;  myoview 1/12:  no ischemia, EF 45%  . CARDIOMYOPATHY 09/24/2008   ischemic;  echo 4/12:  EF 20-25%, mild MR, mod LAE, mod reduced RVSF, mod RVE, mild RAE, mild TR, PASP 40  . CAROTID BRUIT 09/24/2008  . DIABETES MELLITUS 09/24/2008  . Edema 09/24/2008  . GERD (gastroesophageal reflux disease)   . HYPERLIPIDEMIA 09/24/2008  . HYPERTENSION 09/24/2008  . MI 09/24/2008  . Osteopenia   . Persistent atrial fibrillation 09/24/2008   s/p DCCV x 4 in past;  Amiod d/c'd 2/2 abnormal PFTs;  Tikosyn load 6/12 with DCCV  . Tachycardia-bradycardia [facility])    Past Surgical History:  Procedure Laterality Date  . CARDIOVERSION N/A 02/03/2015   Procedure: CARDIOVERSION;  Surgeon: Dorothy Spark, MD;  Location: [facility];  Service: Cardiovascular;  Laterality: N/A;  . ELECTROPHYSIOLOGIC STUDY N/A 10/15/2014   PVI and CTI ablation by Dr Rayann Heman  . HIP FRACTURE SURGERY  2006  . KNEE SURGERY  2008   broken knee  . LOOP RECORDER IMPLANT N/A 11/06/2012   Procedure: LOOP RECORDER IMPLANT;  Surgeon: Thompson Grayer, MD;  Location: [facility] CATH LAB;  Service: Cardiovascular;  Laterality: N/A;Medtronic LinQ implanted by Dr Rayann Heman for palpitaitons and dizziness   . TEE WITHOUT CARDIOVERSION N/A 10/15/2014   Procedure: TRANSESOPHAGEAL ECHOCARDIOGRAM (TEE);  Surgeon: Thayer Headings, MD;  Location: [facility] ENDOSCOPY;  Service: Cardiovascular;  Laterality: N/A;    Current Outpatient Medications  Medication Sig Dispense Refill  . aspirin 81 MG EC tablet Take 81 mg by mouth daily.      Marland Kitchen atorvastatin (LIPITOR) 10 MG tablet Take 1/2 tablet by mouth every day at 6pm (Patient taking differently: Take 5 mg by mouth daily at 6 PM. ) 45 tablet 1  . Blood Glucose Monitoring Suppl (ONE TOUCH ULTRA SYSTEM KIT) w/Device KIT 1 kit by Does not apply route once. 1 each 0  . Calcium Citrate-Vitamin D (CALCIUM + D PO) Take 1 tablet by mouth daily.    Marland Kitchen dofetilide (TIKOSYN) 250 MCG capsule Take 1 capsule (250 mcg total) by mouth 2 (two) times daily. 180 capsule 2  . furosemide (LASIX) 40 MG tablet Take 1 tablet (40 mg total) by mouth daily as needed for fluid. 30 tablet 5  . glucose blood test strip 1 each by Other route as needed for other. Use as instructed with One Touch glucometer. 100 each 11  . Magnesium Oxide 400 MG CAPS Take 1 capsule (400 mg total) by mouth daily. 90 capsule 3  . metoprolol tartrate (LOPRESSOR) 100 MG tablet Take 1 tablet (100 mg total) by mouth 2 (two) times daily. 180 tablet 3  . Multiple Vitamin (MULTIVITAMIN PO) Take 1  tablet by mouth daily.      . potassium chloride (MICRO-K) 10 MEQ CR capsule Take 1 capsule (10 mEq total) by mouth daily as needed (for potassium repacement when taking lasix). 30 capsule 5  . ramipril (ALTACE) 10 MG capsule Take 2 capsules (20 mg total) by mouth daily. 180 capsule 3  . vitamin E 100 UNIT capsule Take 100 Units by mouth daily.      Marland Kitchen warfarin (COUMADIN) 2.5 MG tablet Take as directed by [facility] Clinic 100 tablet 1  . nitroGLYCERIN (NITROSTAT) 0.4 MG SL tablet Place 1 tablet (0.4 mg total) under the tongue every 5 (five) minutes x 3 doses as needed for chest pain. (Patient not taking: Reported on 07/02/2018) 30 tablet  3  . ondansetron (ZOFRAN) 4 MG tablet Take 1 tablet (4 mg total) by mouth every 8 (eight) hours as needed for nausea or vomiting. (Patient not taking: Reported on 07/02/2018) 10 tablet 0   No current facility-administered medications for this encounter.     Allergies  Allergen Reactions  . Simvastatin Rash    Social History   Socioeconomic History  . Marital status: Widowed    Spouse name: Not on file  . Number of children: Not on file  . Years of education: Not on file  . Highest education level: Not on file  Occupational History  . Not on file  Social Needs  . Financial resource strain: Not on file  . Food insecurity:    Worry: Not on file    Inability: Not on file  . Transportation needs:    Medical: Not on file    Non-medical: Not on file  Tobacco Use  . Smoking status: Former Smoker    Packs/day: 0.50    Years: 20.00    Pack years: 10.00    Types: Cigarettes    Last attempt to quit: 07/11/1996    Years since quitting: 21.9  . Smokeless tobacco: Never Used  Substance and Sexual Activity  . Alcohol use: No  . Drug use: No  . Sexual activity: Not on file  Lifestyle  . Physical activity:    Days per week: Not on file    Minutes per session: Not on file  . Stress: Not on file  Relationships  . Social connections:    Talks on phone: Not on file    Gets together: Not on file    Attends religious service: Not on file    Active member of club or organization: Not on file    Attends meetings of clubs or organizations: Not on file    Relationship status: Not on file  . Intimate partner violence:    Fear of current or ex partner: Not on file    Emotionally abused: Not on file    Physically abused: Not on file    Forced sexual activity: Not on file  Other Topics Concern  . Not on file  Social History Narrative  . Not on file    Family History  Problem Relation Age of Onset  . Breast cancer Mother   . Heart disease Mother   . Diabetes Mother   . Alcohol  abuse Father   . Breast cancer Sister   . Brain cancer Sister        cause of death at 27 years old  . Alcohol abuse Brother   . Heart disease Brother   . Heart disease Brother   . Cancer Brother  unknown type  . Coronary artery disease Unknown   . Hypertension Unknown   . Heart attack Brother   . Heart attack Brother     ROS- All systems are reviewed and negative except as per the HPI above  Physical Exam: Vitals:   07/02/18 1019  BP: 124/76  Pulse: 100  Weight: 90.3 kg  Height: '5\' 5"'  (1.651 m)   Wt Readings from Last 3 Encounters:  07/02/18 90.3 kg  06/25/18 89.8 kg  01/28/18 90.3 kg    Labs: Lab Results  Component Value Date   NA 139 01/28/2018   K 4.3 01/28/2018   CL 102 01/28/2018   CO2 28 01/28/2018   GLUCOSE 104 (H) 01/28/2018   BUN 10 01/28/2018   CREATININE 0.77 01/28/2018   CALCIUM 10.0 01/28/2018   MG 2.0 01/28/2018   Lab Results  Component Value Date   INR 2.8 06/25/2018   Lab Results  Component Value Date   CHOL 144 03/20/2017   HDL 41 03/20/2017   LDLCALC 82 03/20/2017   TRIG 105 03/20/2017     GEN- The patient is well appearing obese, elderly female, alert and oriented x 3 today.   HEENT-head normocephalic, atraumatic, sclera clear, conjunctiva pink, hearing intact, trachea midline. Lungs- Clear to ausculation bilaterally, normal work of breathing Heart- Regular rate and rhythm, tachycardia, no murmurs, rubs or gallops  GI- soft, NT, ND, + BS Extremities- no clubbing, cyanosis, trace edema MS- no significant deformity or atrophy Skin- no rash or lesion Psych- euthymic mood, full affect Neuro- strength and sensation are intact  EKG- atrial flutter with variable block, QRS 110, QTc 420  Epic records reviewed   Assessment and Plan:  1. Persistent Afib/atypical atrial flutter Her atrial flutter appears now to be persistent. Will arrange for DCCV. Previous INR on 06/25/18 noted, will check INR today and also the day of the  procedure. Per Dr Rayann Heman, these INR checks are sufficient given her previous well controlled INRs. Continue Tikosyn 250 mcg bid. QT stable. Continue metoprolol at 100 mg bid Continue warfarin 2.5 mg qd for chadsvasc score of 7 Check Bmet/CBC/mag/INR  2. HTN Stable, no changes today.  3. Chronic systolic dysfunction No signs of acute fluid overload. Continue present therapy.   F/u in the afib clinic one week post DCCV.  [facility] [address] [place], [place] [address] [phone]

## 2018-07-02 NOTE — Patient Instructions (Signed)
Cardioversion scheduled for Tuesday, March 17th  - Have INR checked at coumadin clinic at 10:15am   Arrive at the [facility] and go to admitting at 11:30Am  -Do not eat or drink anything after midnight the night prior to your procedure.  - Take all your morning medication with a sip of water prior to arrival.  - You will not be able to drive home after your procedure.

## 2018-07-02 NOTE — H&P (View-Only) (Signed)
Primary Care Physician: Eulas Post, MD  Primary cardiologist: Dr Johnsie Cancel Primary EP: Dr Rayann Heman Referring Physician: Dr. Arnette Schaumann is a 85 y.o. female  that presents for f/u in the afib clinic. She is on Tikosyn. She did have a Linq that came to end of battery in 2017. Patient continues to have more fatigue over the last few weeks. Her SOB is stable, no orthopnea or PND. She is in atrial flutter today.  Today, she denies symptoms of palpitations, chest pain, orthopnea, PND, lower extremity edema, dizziness, presyncope, syncope, or neurologic sequela. The patient is tolerating medications without difficulties and is otherwise without complaint today.   Past Medical History:  Diagnosis Date  . Anemia   . CAD 09/24/2008   AMI in 1998 tx with PCI to LAD;  myoview 1/12:  no ischemia, EF 45%  . CARDIOMYOPATHY 09/24/2008   ischemic;  echo 4/12:  EF 20-25%, mild MR, mod LAE, mod reduced RVSF, mod RVE, mild RAE, mild TR, PASP 40  . CAROTID BRUIT 09/24/2008  . DIABETES MELLITUS 09/24/2008  . Edema 09/24/2008  . GERD (gastroesophageal reflux disease)   . HYPERLIPIDEMIA 09/24/2008  . HYPERTENSION 09/24/2008  . MI 09/24/2008  . Osteopenia   . Persistent atrial fibrillation 09/24/2008   s/p DCCV x 4 in past;  Amiod d/c'd 2/2 abnormal PFTs;  Tikosyn load 6/12 with DCCV  . Tachycardia-bradycardia [facility])    Past Surgical History:  Procedure Laterality Date  . CARDIOVERSION N/A 02/03/2015   Procedure: CARDIOVERSION;  Surgeon: Dorothy Spark, MD;  Location: [facility];  Service: Cardiovascular;  Laterality: N/A;  . ELECTROPHYSIOLOGIC STUDY N/A 10/15/2014   PVI and CTI ablation by Dr Rayann Heman  . HIP FRACTURE SURGERY  2006  . KNEE SURGERY  2008   broken knee  . LOOP RECORDER IMPLANT N/A 11/06/2012   Procedure: LOOP RECORDER IMPLANT;  Surgeon: Thompson Grayer, MD;  Location: [facility] CATH LAB;  Service: Cardiovascular;  Laterality: N/A;Medtronic LinQ implanted by Dr Rayann Heman for palpitaitons and dizziness   . TEE WITHOUT CARDIOVERSION N/A 10/15/2014   Procedure: TRANSESOPHAGEAL ECHOCARDIOGRAM (TEE);  Surgeon: Thayer Headings, MD;  Location: [facility] ENDOSCOPY;  Service: Cardiovascular;  Laterality: N/A;    Current Outpatient Medications  Medication Sig Dispense Refill  . aspirin 81 MG EC tablet Take 81 mg by mouth daily.      Marland Kitchen atorvastatin (LIPITOR) 10 MG tablet Take 1/2 tablet by mouth every day at 6pm (Patient taking differently: Take 5 mg by mouth daily at 6 PM. ) 45 tablet 1  . Blood Glucose Monitoring Suppl (ONE TOUCH ULTRA SYSTEM KIT) w/Device KIT 1 kit by Does not apply route once. 1 each 0  . Calcium Citrate-Vitamin D (CALCIUM + D PO) Take 1 tablet by mouth daily.    Marland Kitchen dofetilide (TIKOSYN) 250 MCG capsule Take 1 capsule (250 mcg total) by mouth 2 (two) times daily. 180 capsule 2  . furosemide (LASIX) 40 MG tablet Take 1 tablet (40 mg total) by mouth daily as needed for fluid. 30 tablet 5  . glucose blood test strip 1 each by Other route as needed for other. Use as instructed with One Touch glucometer. 100 each 11  . Magnesium Oxide 400 MG CAPS Take 1 capsule (400 mg total) by mouth daily. 90 capsule 3  . metoprolol tartrate (LOPRESSOR) 100 MG tablet Take 1 tablet (100 mg total) by mouth 2 (two) times daily. 180 tablet 3  . Multiple Vitamin (MULTIVITAMIN PO) Take 1  tablet by mouth daily.      . potassium chloride (MICRO-K) 10 MEQ CR capsule Take 1 capsule (10 mEq total) by mouth daily as needed (for potassium repacement when taking lasix). 30 capsule 5  . ramipril (ALTACE) 10 MG capsule Take 2 capsules (20 mg total) by mouth daily. 180 capsule 3  . vitamin E 100 UNIT capsule Take 100 Units by mouth daily.      Marland Kitchen warfarin (COUMADIN) 2.5 MG tablet Take as directed by Coumadin Clinic 100 tablet 1  . nitroGLYCERIN (NITROSTAT) 0.4 MG SL tablet Place 1 tablet (0.4 mg total) under the tongue every 5 (five) minutes x 3 doses as needed for chest pain. (Patient not taking: Reported on 07/02/2018) 30 tablet  3  . ondansetron (ZOFRAN) 4 MG tablet Take 1 tablet (4 mg total) by mouth every 8 (eight) hours as needed for nausea or vomiting. (Patient not taking: Reported on 07/02/2018) 10 tablet 0   No current facility-administered medications for this encounter.     Allergies  Allergen Reactions  . Simvastatin Rash    Social History   Socioeconomic History  . Marital status: Widowed    Spouse name: Not on file  . Number of children: Not on file  . Years of education: Not on file  . Highest education level: Not on file  Occupational History  . Not on file  Social Needs  . Financial resource strain: Not on file  . Food insecurity:    Worry: Not on file    Inability: Not on file  . Transportation needs:    Medical: Not on file    Non-medical: Not on file  Tobacco Use  . Smoking status: Former Smoker    Packs/day: 0.50    Years: 20.00    Pack years: 10.00    Types: Cigarettes    Last attempt to quit: 07/11/1996    Years since quitting: 21.9  . Smokeless tobacco: Never Used  Substance and Sexual Activity  . Alcohol use: No  . Drug use: No  . Sexual activity: Not on file  Lifestyle  . Physical activity:    Days per week: Not on file    Minutes per session: Not on file  . Stress: Not on file  Relationships  . Social connections:    Talks on phone: Not on file    Gets together: Not on file    Attends religious service: Not on file    Active member of club or organization: Not on file    Attends meetings of clubs or organizations: Not on file    Relationship status: Not on file  . Intimate partner violence:    Fear of current or ex partner: Not on file    Emotionally abused: Not on file    Physically abused: Not on file    Forced sexual activity: Not on file  Other Topics Concern  . Not on file  Social History Narrative  . Not on file    Family History  Problem Relation Age of Onset  . Breast cancer Mother   . Heart disease Mother   . Diabetes Mother   . Alcohol  abuse Father   . Breast cancer Sister   . Brain cancer Sister        cause of death at 81 years old  . Alcohol abuse Brother   . Heart disease Brother   . Heart disease Brother   . Cancer Brother  unknown type  . Coronary artery disease Unknown   . Hypertension Unknown   . Heart attack Brother   . Heart attack Brother     ROS- All systems are reviewed and negative except as per the HPI above  Physical Exam: Vitals:   07/02/18 1019  BP: 124/76  Pulse: 100  Weight: 90.3 kg  Height: '5\' 5"'  (1.651 m)   Wt Readings from Last 3 Encounters:  07/02/18 90.3 kg  06/25/18 89.8 kg  01/28/18 90.3 kg    Labs: Lab Results  Component Value Date   NA 139 01/28/2018   K 4.3 01/28/2018   CL 102 01/28/2018   CO2 28 01/28/2018   GLUCOSE 104 (H) 01/28/2018   BUN 10 01/28/2018   CREATININE 0.77 01/28/2018   CALCIUM 10.0 01/28/2018   MG 2.0 01/28/2018   Lab Results  Component Value Date   INR 2.8 06/25/2018   Lab Results  Component Value Date   CHOL 144 03/20/2017   HDL 41 03/20/2017   LDLCALC 82 03/20/2017   TRIG 105 03/20/2017     GEN- The patient is well appearing obese, elderly female, alert and oriented x 3 today.   HEENT-head normocephalic, atraumatic, sclera clear, conjunctiva pink, hearing intact, trachea midline. Lungs- Clear to ausculation bilaterally, normal work of breathing Heart- Regular rate and rhythm, tachycardia, no murmurs, rubs or gallops  GI- soft, NT, ND, + BS Extremities- no clubbing, cyanosis, trace edema MS- no significant deformity or atrophy Skin- no rash or lesion Psych- euthymic mood, full affect Neuro- strength and sensation are intact  EKG- atrial flutter with variable block, QRS 110, QTc 420  Epic records reviewed   Assessment and Plan:  1. Persistent Afib/atypical atrial flutter Her atrial flutter appears now to be persistent. Will arrange for DCCV. Previous INR on 06/25/18 noted, will check INR today and also the day of the  procedure. Per Dr Rayann Heman, these INR checks are sufficient given her previous well controlled INRs. Continue Tikosyn 250 mcg bid. QT stable. Continue metoprolol at 100 mg bid Continue warfarin 2.5 mg qd for chadsvasc score of 7 Check Bmet/CBC/mag/INR  2. HTN Stable, no changes today.  3. Chronic systolic dysfunction No signs of acute fluid overload. Continue present therapy.   F/u in the afib clinic one week post DCCV.  [facility] [address] [place], [place] [address] [phone]

## 2018-07-08 ENCOUNTER — Other Ambulatory Visit: Payer: Self-pay

## 2018-07-08 ENCOUNTER — Ambulatory Visit (HOSPITAL_COMMUNITY): Payer: PPO | Admitting: Certified Registered Nurse Anesthetist

## 2018-07-08 ENCOUNTER — Encounter (HOSPITAL_COMMUNITY)
Admission: RE | Disposition: A | Discharge status: Home / Self Care | Payer: Self-pay | Source: Home / Self Care | Attending: Cardiovascular Disease

## 2018-07-08 ENCOUNTER — Ambulatory Visit (HOSPITAL_COMMUNITY)
Admission: RE | Admit: 2018-07-08 | Discharge: 2018-07-08 | Disposition: A | Discharge status: Home / Self Care | Payer: PPO | Attending: Cardiovascular Disease | Admitting: Cardiovascular Disease

## 2018-07-08 ENCOUNTER — Encounter (HOSPITAL_COMMUNITY): Payer: Self-pay

## 2018-07-08 ENCOUNTER — Ambulatory Visit (INDEPENDENT_AMBULATORY_CARE_PROVIDER_SITE_OTHER): Payer: PPO | Admitting: Pharmacist

## 2018-07-08 DIAGNOSIS — Z87891 Personal history of nicotine dependence: Secondary | ICD-10-CM | POA: Diagnosis not present

## 2018-07-08 DIAGNOSIS — Z7982 Long term (current) use of aspirin: Secondary | ICD-10-CM | POA: Diagnosis not present

## 2018-07-08 DIAGNOSIS — I4819 Other persistent atrial fibrillation: Secondary | ICD-10-CM | POA: Diagnosis not present

## 2018-07-08 DIAGNOSIS — I2510 Atherosclerotic heart disease of native coronary artery without angina pectoris: Secondary | ICD-10-CM | POA: Diagnosis not present

## 2018-07-08 DIAGNOSIS — I252 Old myocardial infarction: Secondary | ICD-10-CM | POA: Diagnosis not present

## 2018-07-08 DIAGNOSIS — Z5181 Encounter for therapeutic drug level monitoring: Secondary | ICD-10-CM

## 2018-07-08 DIAGNOSIS — Z7901 Long term (current) use of anticoagulants: Secondary | ICD-10-CM | POA: Insufficient documentation

## 2018-07-08 DIAGNOSIS — I484 Atypical atrial flutter: Secondary | ICD-10-CM | POA: Insufficient documentation

## 2018-07-08 DIAGNOSIS — I10 Essential (primary) hypertension: Secondary | ICD-10-CM | POA: Diagnosis not present

## 2018-07-08 DIAGNOSIS — E119 Type 2 diabetes mellitus without complications: Secondary | ICD-10-CM | POA: Insufficient documentation

## 2018-07-08 DIAGNOSIS — Z79899 Other long term (current) drug therapy: Secondary | ICD-10-CM | POA: Insufficient documentation

## 2018-07-08 DIAGNOSIS — I4891 Unspecified atrial fibrillation: Secondary | ICD-10-CM

## 2018-07-08 DIAGNOSIS — K219 Gastro-esophageal reflux disease without esophagitis: Secondary | ICD-10-CM | POA: Diagnosis not present

## 2018-07-08 HISTORY — PX: CARDIOVERSION: SHX1299

## 2018-07-08 LAB — POCT INR: INR: 3.2 — AB (ref 2.0–3.0)

## 2018-07-08 SURGERY — CARDIOVERSION
Anesthesia: General

## 2018-07-08 MED ORDER — PROPOFOL 10 MG/ML IV BOLUS
INTRAVENOUS | Status: DC | PRN
  Administered 2018-07-08: 60 mg via INTRAVENOUS
  Administered 2018-07-08: 20 mg via INTRAVENOUS

## 2018-07-08 MED ORDER — SODIUM CHLORIDE 0.9 % IV SOLN
INTRAVENOUS | Status: DC
  Administered 2018-07-08: 20 mL/h via INTRAVENOUS

## 2018-07-08 NOTE — Patient Instructions (Signed)
Description   Continue same dosage 1 tablet daily except 1/2 tablet on Tuesdays (cardioversion today). Stay consistent with your greens. Recheck in 1 week.  Call if needed or any new medications or procedures [phone]

## 2018-07-08 NOTE — Anesthesia Postprocedure Evaluation (Signed)
Anesthesia Post Note  Patient: JOYCELIN MCNATT  Procedure(s) Performed: CARDIOVERSION (N/A )     Patient location during evaluation: PACU Anesthesia Type: General Level of consciousness: awake and alert, oriented and patient cooperative Pain management: pain level controlled Vital Signs Assessment: post-procedure vital signs reviewed and stable Respiratory status: spontaneous breathing, nonlabored ventilation, respiratory function stable and patient connected to nasal cannula oxygen Cardiovascular status: blood pressure returned to baseline and stable Postop Assessment: no apparent nausea or vomiting Anesthetic complications: no    Last Vitals:  Vitals:   07/08/18 1045 07/08/18 1333  BP: (!) 131/97 116/62  Pulse: (!) 125 (!) 102  Resp: 16 19  Temp: 36.6 C   SpO2: 94% 92%    Last Pain:  Vitals:   07/08/18 1333  TempSrc: Oral  PainSc: 0-No pain                 Nayvie Lips,E. Kalyssa Anker

## 2018-07-08 NOTE — Transfer of Care (Signed)
Immediate Anesthesia Transfer of Care Note  Patient: [name]  Procedure(s) Performed: CARDIOVERSION (N/A )  Patient Location: Endoscopy Unit  Anesthesia Type:General  Level of Consciousness: drowsy and patient cooperative  Airway & Oxygen Therapy: Patient Spontanous Breathing and Patient connected to face mask oxygen  Post-op Assessment: Report given to RN and Post -op Vital signs reviewed and stable  Post vital signs: Reviewed and stable  Last Vitals:  Vitals Value Taken Time  BP 116/62   Temp    Pulse 107   Resp 14   SpO2 96%     Last Pain:  Vitals:   07/08/18 1045  TempSrc: Oral  PainSc: 3          Complications: No apparent anesthesia complications

## 2018-07-08 NOTE — Interval H&P Note (Signed)
History and Physical Interval Note:  07/08/2018 11:01 AM  [name]  has presented today for surgery, with the diagnosis of a fib.  The various methods of treatment have been discussed with the patient and family. After consideration of risks, benefits and other options for treatment, the patient has consented to  Procedure(s) with comments: CARDIOVERSION (N/A) - getting INR prior as a surgical intervention.  The patient's history has been reviewed, patient examined, no change in status, stable for surgery.  I have reviewed the patient's chart and labs.  Questions were answered to the patient's satisfaction.     Kristeen Miss

## 2018-07-08 NOTE — Discharge Instructions (Signed)
Electrical Cardioversion, Care After °This sheet gives you information about how to care for yourself after your procedure. Your health care provider may also give you more specific instructions. If you have problems or questions, contact your health care provider. °What can I expect after the procedure? °After the procedure, it is common to have: °· Some redness on the skin where the shocks were given. °Follow these instructions at home: ° °· Do not drive for 24 hours if you were given a medicine to help you relax (sedative). °· Take over-the-counter and prescription medicines only as told by your health care provider. °· Ask your health care provider how to check your pulse. Check it often. °· Rest for 48 hours after the procedure or as told by your health care provider. °· Avoid or limit your caffeine use as told by your health care provider. °Contact a health care provider if: °· You feel like your heart is beating too quickly or your pulse is not regular. °· You have a serious muscle cramp that does not go away. °Get help right away if: ° °· You have discomfort in your chest. °· You are dizzy or you feel faint. °· You have trouble breathing or you are short of breath. °· Your speech is slurred. °· You have trouble moving an arm or leg on one side of your body. °· Your fingers or toes turn cold or blue. °This information is not intended to replace advice given to you by your health care provider. Make sure you discuss any questions you have with your health care provider. °Document Released: 01/28/2013 Document Revised: 11/11/2015 Document Reviewed: 10/14/2015 °Elsevier Interactive Patient Education © 2019 [employer] ° °

## 2018-07-08 NOTE — CV Procedure (Signed)
    Cardioversion Note  [name] [id number] Apr 30, 1929  Procedure: DC Cardioversion Indications: atrial flutter   Procedure Details Consent: Obtained Time Out: Verified patient identification, verified procedure, site/side was marked, verified correct patient position, special equipment/implants available, Radiology Safety Procedures followed,  medications/allergies/relevent history reviewed, required imaging and test results available.  Performed  The patient has been on adequate anticoagulation.  The patient received IV  Propofol 100 mg  for sedation.  Synchronous cardioversion was performed at 50, 120, 200.  joules.  The cardioversion was unsuccessful.   She appears to still be in atrial flutter .   ECG has been ordered.  Her HR did slow after the cardioversion    Complications: No apparent complications Patient did tolerate procedure well.   Vesta Mixer, Montez Hageman., MD, [facility] 07/08/2018, 1:32 PM

## 2018-07-08 NOTE — Anesthesia Preprocedure Evaluation (Addendum)
Anesthesia Evaluation  Patient identified by MRN, date of birth, ID band Patient awake    Reviewed: Allergy & Precautions, NPO status , Patient's Chart, lab work & pertinent test results, reviewed documented beta blocker date and time   History of Anesthesia Complications Negative for: history of anesthetic complications  Airway Mallampati: II  TM Distance: >3 FB Neck ROM: Full    Dental  (+) Dental Advisory Given   Pulmonary former smoker,    breath sounds clear to auscultation       Cardiovascular hypertension, Pt. on home beta blockers and Pt. on medications (-) angina+ CAD and + Past MI   Rhythm:Irregular Rate:Tachycardia  echo 4/12:  EF 20-25%, mild MR   Neuro/Psych negative neurological ROS     GI/Hepatic negative GI ROS, Neg liver ROS,   Endo/Other  diabetes  Renal/GU negative Renal ROS     Musculoskeletal   Abdominal   Peds  Hematology negative hematology ROS (+) coumadin   Anesthesia Other Findings   Reproductive/Obstetrics                            Anesthesia Physical Anesthesia Plan  ASA: III  Anesthesia Plan: General   Post-op Pain Management:    Induction: Intravenous  PONV Risk Score and Plan: 3 and Treatment may vary due to age or medical condition  Airway Management Planned: Mask  Additional Equipment:   Intra-op Plan:   Post-operative Plan:   Informed Consent: I have reviewed the patients History and Physical, chart, labs and discussed the procedure including the risks, benefits and alternatives for the proposed anesthesia with the patient or authorized representative who has indicated his/her understanding and acceptance.     Dental advisory given  Plan Discussed with: CRNA and Surgeon  Anesthesia Plan Comments:         Anesthesia Quick Evaluation

## 2018-07-08 NOTE — Interval H&P Note (Signed)
History and Physical Interval Note:  07/08/2018 1:10 PM  [name]  has presented today for surgery, with the diagnosis of a fib.  The various methods of treatment have been discussed with the patient and family. After consideration of risks, benefits and other options for treatment, the patient has consented to  Procedure(s) with comments: CARDIOVERSION (N/A) - getting INR prior as a surgical intervention.  The patient's history has been reviewed, patient examined, no change in status, stable for surgery.  I have reviewed the patient's chart and labs.  Questions were answered to the patient's satisfaction.     Kristeen Miss

## 2018-07-08 NOTE — Interval H&P Note (Signed)
History and Physical Interval Note:  07/08/2018 12:46 PM  [name]  has presented today for surgery, with the diagnosis of a fib.  The various methods of treatment have been discussed with the patient and family. After consideration of risks, benefits and other options for treatment, the patient has consented to  Procedure(s) with comments: CARDIOVERSION (N/A) - getting INR prior as a surgical intervention.  The patient's history has been reviewed, patient examined, no change in status, stable for surgery.  I have reviewed the patient's chart and labs.  Questions were answered to the patient's satisfaction.     Kristeen Miss

## 2018-07-09 ENCOUNTER — Encounter (HOSPITAL_COMMUNITY): Payer: Self-pay | Admitting: Cardiovascular Disease

## 2018-07-14 ENCOUNTER — Telehealth (HOSPITAL_COMMUNITY): Payer: Self-pay | Admitting: *Deleted

## 2018-07-14 NOTE — Telephone Encounter (Signed)
Patient called in stating BP/HR  103/50 HR 58 118/87 HR 51 112/50 HR 52 110/61 HR 60 Pt will keep telephone appointment with Jorja Loa, PA

## 2018-07-16 ENCOUNTER — Telehealth: Payer: Self-pay

## 2018-07-16 ENCOUNTER — Other Ambulatory Visit: Payer: Self-pay

## 2018-07-16 MED ORDER — APIXABAN 5 MG PO TABS: 5.0000 mg | ORAL_TABLET | Freq: Two times a day (BID) | ORAL | 0 refills | Status: DC

## 2018-07-16 NOTE — Telephone Encounter (Signed)
1. Do you currently have a fever?  NP(yes = cancel and refer to pcp for e-visit) 2. Have you recently travelled on a cruise, internationally, or to [place], [place], [place], [place], [place], or [place], [place] [employer]) ? NO (yes = cancel, stay home, monitor symptoms, and contact pcp or initiate e-visit if symptoms develop) 3. Have you been in contact with someone that is currently pending confirmation of Covid19 testing or has been confirmed to have the Covid19 virus?  NO (yes = cancel, stay home, away from tested individual, monitor symptoms, and contact pcp or initiate e-visit if symptoms develop) 4. Are you currently experiencing fatigue or cough? NO (yes = pt should be prepared to have a mask placed at the time of their visit).  Pt. Advised that we are restricting visitors at this time and anyone present in the vehicle should meet the above criteria as well. Advised that visit will be at curbside for finger stick ONLY and will receive call with instructions. Pt also advised to please bring own pen for signature of arrival document.

## 2018-07-18 ENCOUNTER — Other Ambulatory Visit (HOSPITAL_COMMUNITY): Payer: Self-pay | Admitting: *Deleted

## 2018-07-18 ENCOUNTER — Ambulatory Visit (HOSPITAL_COMMUNITY)
Admission: RE | Admit: 2018-07-18 | Discharge: 2018-07-18 | Disposition: A | Discharge status: Home / Self Care | Payer: PPO | Source: Ambulatory Visit | Attending: Physician Assistant | Admitting: Physician Assistant

## 2018-07-18 ENCOUNTER — Ambulatory Visit (HOSPITAL_COMMUNITY)
Admission: RE | Admit: 2018-07-18 | Discharge: 2018-07-18 | Disposition: A | Discharge status: Home / Self Care | Payer: PPO | Source: Ambulatory Visit | Attending: Nurse Practitioner | Admitting: Nurse Practitioner

## 2018-07-18 ENCOUNTER — Other Ambulatory Visit: Payer: Self-pay

## 2018-07-18 ENCOUNTER — Ambulatory Visit (INDEPENDENT_AMBULATORY_CARE_PROVIDER_SITE_OTHER): Payer: PPO | Admitting: Pharmacist

## 2018-07-18 VITALS — BP 124/87 | HR 53

## 2018-07-18 DIAGNOSIS — Z5181 Encounter for therapeutic drug level monitoring: Secondary | ICD-10-CM | POA: Diagnosis not present

## 2018-07-18 DIAGNOSIS — I110 Hypertensive heart disease with heart failure: Secondary | ICD-10-CM | POA: Diagnosis not present

## 2018-07-18 DIAGNOSIS — I509 Heart failure, unspecified: Secondary | ICD-10-CM | POA: Diagnosis not present

## 2018-07-18 DIAGNOSIS — I484 Atypical atrial flutter: Secondary | ICD-10-CM

## 2018-07-18 DIAGNOSIS — I4819 Other persistent atrial fibrillation: Secondary | ICD-10-CM

## 2018-07-18 DIAGNOSIS — I4891 Unspecified atrial fibrillation: Secondary | ICD-10-CM

## 2018-07-18 LAB — POCT INR: INR: 2.5 (ref 2.0–3.0)

## 2018-07-18 NOTE — Progress Notes (Signed)
Electrophysiology TeleHealth Note   Due to national recommendations of social distancing due to [place] 19, Audio telehealth visit is felt to be most appropriate for this patient at this time.  See consent below from today for patient consent regarding telehealth for the [facility]. Consent obtained verbally.   Date:  07/18/2018   ID:  [name], DOB 03-05-1930, MRN [id number]  Location: home  Provider location: [address] [place], [place] [address] Evaluation Performed: Follow up  PCP:  Eulas Post, MD  Primary Cardiologist: Dr Johnsie Cancel Primary Electrophysiologist: Dr Rayann Heman   CC: Follow up for atrial fibrillation   History of Present Illness: [name] is a 85 y.o. female who presents via audio conferencing for a telehealth visit today. Patient recently underwent DCCV on 07/08/18 for persistent atrial flutter which was unsuccessful at restoring SR. She reports that she is still feeling like she is out of rhythm with more fatigue and SOB.   Today, she denies symptoms of palpitations, chest pain, orthopnea, PND, lower extremity edema, claudication, dizziness, presyncope, syncope, bleeding, or neurologic sequela. The patient is tolerating medications without difficulties and is otherwise without complaint today.   she denies symptoms of cough, fevers, chills, or new SOB worrisome for COVID 19.   she has a BMI of There is no height or weight on file to calculate BMI..  BP 124/87 (vitals provided by patient with home BP machine) Pulse 53  Past Medical History:  Diagnosis Date  . Anemia   . CAD 09/24/2008   AMI in 1998 tx with PCI to LAD;  myoview 1/12:  no ischemia, EF 45%  . CARDIOMYOPATHY 09/24/2008   ischemic;  echo 4/12:  EF 20-25%, mild MR, mod LAE, mod reduced RVSF, mod RVE, mild RAE, mild TR, PASP 40  . CAROTID BRUIT 09/24/2008  . DIABETES MELLITUS 09/24/2008  . Edema 09/24/2008  . GERD (gastroesophageal reflux disease)   . HYPERLIPIDEMIA 09/24/2008   . HYPERTENSION 09/24/2008  . MI 09/24/2008  . Osteopenia   . Persistent atrial fibrillation 09/24/2008   s/p DCCV x 4 in past;  Amiod d/c'd 2/2 abnormal PFTs;  Tikosyn load 6/12 with DCCV  . Tachycardia-bradycardia [facility])    Past Surgical History:  Procedure Laterality Date  . CARDIOVERSION N/A 02/03/2015   Procedure: CARDIOVERSION;  Surgeon: Dorothy Spark, MD;  Location: [facility] ENDOSCOPY;  Service: Cardiovascular;  Laterality: N/A;  . CARDIOVERSION N/A 07/08/2018   Procedure: CARDIOVERSION;  Surgeon: Thayer Headings, MD;  Location: [facility] ENDOSCOPY;  Service: Cardiovascular;  Laterality: N/A;  getting INR prior  . ELECTROPHYSIOLOGIC STUDY N/A 10/15/2014   PVI and CTI ablation by Dr Rayann Heman  . HIP FRACTURE SURGERY  2006  . KNEE SURGERY  2008   broken knee  . LOOP RECORDER IMPLANT N/A 11/06/2012   Procedure: LOOP RECORDER IMPLANT;  Surgeon: Thompson Grayer, MD;  Location[facility] CATH LAB;  Service: Cardiovascular;  Laterality: N/A;Medtronic LinQ implanted by Dr Rayann Heman for palpitaitons and dizziness  . TEE WITHOUT CARDIOVERSION N/A 10/15/2014   Procedure: TRANSESOPHAGEAL ECHOCARDIOGRAM (TEE);  Surgeon: Thayer Headings, MD;  Location: [facility] ENDOSCOPY;  Service: Cardiovascular;  Laterality: N/A;     Current Outpatient Medications  Medication Sig Dispense Refill  . apixaban (ELIQUIS) 5 MG TABS tablet Take 1 tablet (5 mg total) by mouth 2 (two) times daily. 180 tablet 0  . aspirin 81 MG EC tablet Take 81 mg by mouth every evening.     Marland Kitchen atorvastatin (LIPITOR) 10  MG tablet Take 1/2 tablet by mouth every day at 6pm (Patient taking differently: Take 5 mg by mouth daily at 6 PM. ) 45 tablet 1  . Blood Glucose Monitoring Suppl (ONE TOUCH ULTRA SYSTEM KIT) w/Device KIT 1 kit by Does not apply route once. 1 each 0  . Calcium Carb-Cholecalciferol (CALCIUM 600+D3 PO) Take 1 tablet by mouth daily.    Marland Kitchen dofetilide (TIKOSYN) 250 MCG capsule Take 1 capsule (250 mcg total) by mouth 2 (two) times daily. 180 capsule 2  .  furosemide (LASIX) 40 MG tablet Take 1 tablet (40 mg total) by mouth daily as needed for fluid. 30 tablet 5  . glucose blood test strip 1 each by Other route as needed for other. Use as instructed with One Touch glucometer. 100 each 11  . Magnesium Oxide 400 MG CAPS Take 1 capsule (400 mg total) by mouth daily. (Patient taking differently: Take 400 mg by mouth every evening. ) 90 capsule 3  . metoprolol tartrate (LOPRESSOR) 100 MG tablet Take 1 tablet (100 mg total) by mouth 2 (two) times daily. (Patient taking differently: Take 50 mg by mouth 2 (two) times daily. ) 180 tablet 3  . Multiple Vitamin (MULTIVITAMIN WITH MINERALS) TABS tablet Take 1 tablet by mouth daily. One-A-Day    . potassium chloride (MICRO-K) 10 MEQ CR capsule Take 1 capsule (10 mEq total) by mouth daily as needed (for potassium repacement when taking lasix). 30 capsule 5  . ramipril (ALTACE) 10 MG capsule Take 2 capsules (20 mg total) by mouth daily. (Patient taking differently: Take 10 mg by mouth 2 (two) times daily. ) 180 capsule 3  . vitamin B-12 (CYANOCOBALAMIN) 500 MCG tablet Take 500 mcg by mouth daily.    . vitamin E 400 UNIT capsule Take 400 Units by mouth daily.     No current facility-administered medications for this encounter.     Allergies:   Simvastatin   Social History:  The patient  reports that she quit smoking about 22 years ago. Her smoking use included cigarettes. She has a 10.00 pack-year smoking history. She has never used smokeless tobacco. She reports that she does not drink alcohol or use drugs.   Family History:  The patient's family history includes Alcohol abuse in her brother and father; Brain cancer in her sister; Breast cancer in her mother and sister; Cancer in her brother; Coronary artery disease in an other family member; Diabetes in her mother; Heart attack in her brother and brother; Heart disease in her brother, brother, and mother; Hypertension in an other family member.    ROS:  Please  see the history of present illness.   All other systems are personally reviewed and negative.   Recent Labs: 07/02/2018: BUN 23; Creatinine, Ser 0.91; Hemoglobin 14.1; Magnesium 1.9; Platelets 285; Potassium 4.7; Sodium 141  personally reviewed    Other studies personally reviewed: Additional studies/ records that were reviewed today include: Epic notes   ASSESSMENT AND PLAN:  1. Persistent atrial fibrillation/atypical flutter S/p unsuccessful DCCV on 07/08/18. I do feel this represents a Tikosyn failure. Options for rhythm control are limited due to age and failure of Tikosyn and amiodarone. We did discuss a rate control strategy today and the patient is in agreement.  Stop Tikosyn Will order Zio patch for 3 days to evaluate rate control. Suspect patient's home BP machine is underestimated pulse rate 2/2 irregular pulse. Patient transitioning from warfarin to Eliquis per Dr Rayann Heman.   This patients CHA2DS2-VASc Score and  unadjusted Ischemic Stroke Rate (% per year) is equal to 11.2 % stroke rate/year from a score of 7  Above score calculated as 1 point each if present [CHF, HTN, DM, Vascular=MI/PAD/Aortic Plaque, Age if 65-74, or Female] Above score calculated as 2 points each if present [Age > 75, or Stroke/TIA/TE]  2. HTN Stable, no changes today.  3. Chronic systolic CHF No symptoms of fluid overload.   COVID screen The patient does not have any symptoms that suggest any further testing/ screening at this time.  Social distancing reinforced today.    Follow-up:  Telehealth visit with the Afib Clinic in 3 weeks.  Current medicines are reviewed at length with the patient today.   The patient does not have concerns regarding her medicines.  The following changes were made today:  Stop Tikosyn  Labs/ tests ordered today include:  No orders of the defined types were placed in this encounter.   Patient Risk:  after full review of this patients clinical status, I feel that they  are at moderate risk at this time.   Today, I have spent 15 minutes with the patient with telehealth technology discussing atrial fibrillation, AAD medication, and rate control.    Gwenlyn Perking PA-C 07/18/2018 10:24 AM  [facility] [address] [place], [place] [address] [phone]   I hereby voluntarily request, consent and authorize the [facility] and its employed or contracted physicians, physician assistants, nurse practitioners or other licensed health care professionals (the Practitioner), to provide me with telemedicine health care services (the "Services") as deemed necessary by the treating Practitioner. I acknowledge and consent to receive the Services by the Practitioner via telemedicine. I understand that the telemedicine visit will involve communicating with the Practitioner through live audiovisual communication technology and the disclosure of certain medical information by electronic transmission. I acknowledge that I have been given the opportunity to request an in-person assessment or other available alternative prior to the telemedicine visit and am voluntarily participating in the telemedicine visit.   I understand that I have the right to withhold or withdraw my consent to the use of telemedicine in the course of my care at any time, without affecting my right to future care or treatment, and that the Practitioner or I may terminate the telemedicine visit at any time. I understand that I have the right to inspect all information obtained and/or recorded in the course of the telemedicine visit and may receive copies of available information for a reasonable fee.  I understand that some of the potential risks of receiving the Services via telemedicine include:   Delay or interruption in medical evaluation due to technological equipment failure or disruption;  Information transmitted may not be sufficient (e.g. poor resolution of  images) to allow for appropriate medical decision making by the Practitioner; and/or  In rare instances, security protocols could fail, causing a breach of personal health information.   Furthermore, I acknowledge that it is my responsibility to provide information about my medical history, conditions and care that is complete and accurate to the best of my ability. I acknowledge that Practitioner's advice, recommendations, and/or decision may be based on factors not within their control, such as incomplete or inaccurate data provided by me or distortions of diagnostic images or specimens that may result from electronic transmissions. I understand that the practice of medicine is not an exact science and that Practitioner makes no warranties or guarantees regarding treatment outcomes. I acknowledge that I  will receive a copy of this consent concurrently upon execution via email to the email address I last provided but may also request a printed copy by calling the office of the [facility].  I understand that my insurance will be billed for this visit.   I have read or had this consent read to me.  I understand the contents of this consent, which adequately explains the benefits and risks of the Services being provided via telemedicine.  I have been provided ample opportunity to ask questions regarding this consent and the Services and have had my questions answered to my satisfaction.  I give my informed consent for the services to be provided through the use of telemedicine in my medical care  By participating in this telemedicine visit I agree to the above.

## 2018-07-22 MED ORDER — APIXABAN 5 MG PO TABS: 5.0000 mg | ORAL_TABLET | Freq: Two times a day (BID) | ORAL | 0 refills | Status: DC

## 2018-07-24 ENCOUNTER — Ambulatory Visit (HOSPITAL_COMMUNITY)
Admission: RE | Admit: 2018-07-24 | Discharge: 2018-07-24 | Disposition: A | Discharge status: Home / Self Care | Payer: PPO | Source: Ambulatory Visit | Attending: Physician Assistant | Admitting: Physician Assistant

## 2018-07-24 ENCOUNTER — Other Ambulatory Visit: Payer: Self-pay

## 2018-07-24 DIAGNOSIS — I4819 Other persistent atrial fibrillation: Secondary | ICD-10-CM

## 2018-07-31 DIAGNOSIS — I4819 Other persistent atrial fibrillation: Secondary | ICD-10-CM | POA: Diagnosis not present

## 2018-08-07 ENCOUNTER — Other Ambulatory Visit: Payer: Self-pay

## 2018-08-07 ENCOUNTER — Ambulatory Visit (HOSPITAL_COMMUNITY)
Admission: RE | Admit: 2018-08-07 | Discharge: 2018-08-07 | Disposition: A | Discharge status: Home / Self Care | Payer: PPO | Source: Ambulatory Visit | Attending: Physician Assistant | Admitting: Physician Assistant

## 2018-08-07 ENCOUNTER — Encounter (HOSPITAL_COMMUNITY): Payer: Self-pay | Admitting: Physician Assistant

## 2018-08-07 VITALS — BP 93/58 | HR 64 | Ht 65.0 in

## 2018-08-07 DIAGNOSIS — I5022 Chronic systolic (congestive) heart failure: Secondary | ICD-10-CM

## 2018-08-07 DIAGNOSIS — I4821 Permanent atrial fibrillation: Secondary | ICD-10-CM

## 2018-08-07 DIAGNOSIS — I110 Hypertensive heart disease with heart failure: Secondary | ICD-10-CM | POA: Diagnosis not present

## 2018-08-07 NOTE — Progress Notes (Signed)
Electrophysiology TeleHealth Note   Due to national recommendations of social distancing due to [place] 19, Audio telehealth visit is felt to be most appropriate for this patient at this time.  See consent below from today for patient consent regarding telehealth for the [facility]. Consent obtained verbally.   Date:  08/07/2018   ID:  [name], DOB [birth year], MRN [id number]  Location: home  Provider location: [address] [place], [place] [address] Evaluation Performed: Follow up  PCP:  Eulas Post, MD  Primary Cardiologist: Dr Johnsie Cancel Primary Electrophysiologist: Dr Rayann Heman   CC: Follow up for atrial fibrillation   History of Present Illness: [name] is a 85 y.o. female who presents via audio conferencing for a telehealth visit today. Patient recently underwent DCCV on 07/08/18 for persistent atrial flutter which was unsuccessful at restoring SR. Patient reports that she feels improved off Tikosyn with less SOB and no palpitations. She wore a Zio patch which reveal almost 100% AF/flutter burden with avg HR 105. She was very surprised to hear she was not in SR because she has been feeling so well.   Today, she denies symptoms of palpitations, SOB, chest pain, orthopnea, PND, claudication, dizziness, presyncope, syncope, bleeding, or neurologic sequela. The patient is tolerating medications without difficulties and is otherwise without complaint today.   she denies symptoms of cough, fevers, chills, or new SOB worrisome for COVID 19.   she has a BMI of Body mass index is 33.12 kg/m..  BP 93/58 Pulse 64 Provided by patient with home BP machine  Past Medical History:  Diagnosis Date  . Anemia   . CAD 09/24/2008   AMI in 1998 tx with PCI to LAD;  myoview 1/12:  no ischemia, EF 45%  . CARDIOMYOPATHY 09/24/2008   ischemic;  echo 4/12:  EF 20-25%, mild MR, mod LAE, mod reduced RVSF, mod RVE, mild RAE, mild TR, PASP 40  . CAROTID BRUIT 09/24/2008  .  DIABETES MELLITUS 09/24/2008  . Edema 09/24/2008  . GERD (gastroesophageal reflux disease)   . HYPERLIPIDEMIA 09/24/2008  . HYPERTENSION 09/24/2008  . MI 09/24/2008  . Osteopenia   . Persistent atrial fibrillation 09/24/2008   s/p DCCV x 4 in past;  Amiod d/c'd 2/2 abnormal PFTs;  Tikosyn load 6/12 with DCCV  . Tachycardia-bradycardia [facility])    Past Surgical History:  Procedure Laterality Date  . CARDIOVERSION N/A 02/03/2015   Procedure: CARDIOVERSION;  Surgeon: Dorothy Spark, MD;  Location: [facility] ENDOSCOPY;  Service: Cardiovascular;  Laterality: N/A;  . CARDIOVERSION N/A 07/08/2018   Procedure: CARDIOVERSION;  Surgeon: Thayer Headings, MD;  Location: [name] ENDOSCOPY;  Service: Cardiovascular;  Laterality: N/A;  getting INR prior  . ELECTROPHYSIOLOGIC STUDY N/A 10/15/2014   PVI and CTI ablation by Dr Rayann Heman  . HIP FRACTURE SURGERY  2006  . KNEE SURGERY  2008   broken knee  . LOOP RECORDER IMPLANT N/A 11/06/2012   Procedure: LOOP RECORDER IMPLANT;  Surgeon: Thompson Grayer, MD;  Location: [facility] CATH LAB;  Service: Cardiovascular;  Laterality: N/A;Medtronic LinQ implanted by Dr Rayann Heman for palpitaitons and dizziness  . TEE WITHOUT CARDIOVERSION N/A 10/15/2014   Procedure: TRANSESOPHAGEAL ECHOCARDIOGRAM (TEE);  Surgeon: Thayer Headings, MD;  Location: [facility] ENDOSCOPY;  Service: Cardiovascular;  Laterality: N/A;     Current Outpatient Medications  Medication Sig Dispense Refill  . apixaban (ELIQUIS) 5 MG TABS tablet Take 1 tablet (5 mg total) by mouth 2 (two) times daily. 180 tablet 0  .  aspirin 81 MG EC tablet Take 81 mg by mouth every evening.     Marland Kitchen atorvastatin (LIPITOR) 10 MG tablet Take 1/2 tablet by mouth every day at 6pm (Patient taking differently: Take 5 mg by mouth daily at 6 PM. ) 45 tablet 1  . Blood Glucose Monitoring Suppl (ONE TOUCH ULTRA SYSTEM KIT) w/Device KIT 1 kit by Does not apply route once. 1 each 0  . Calcium Carb-Cholecalciferol (CALCIUM 600+D3 PO) Take 1 tablet by mouth daily.    .  furosemide (LASIX) 40 MG tablet Take 1 tablet (40 mg total) by mouth daily as needed for fluid. 30 tablet 5  . glucose blood test strip 1 each by Other route as needed for other. Use as instructed with One Touch glucometer. 100 each 11  . Magnesium Oxide 400 MG CAPS Take 1 capsule (400 mg total) by mouth daily. (Patient taking differently: Take 400 mg by mouth every evening. ) 90 capsule 3  . metoprolol tartrate (LOPRESSOR) 100 MG tablet Take 50 mg by mouth 2 (two) times daily.    . Multiple Vitamin (MULTIVITAMIN WITH MINERALS) TABS tablet Take 1 tablet by mouth daily. One-A-Day    . potassium chloride (MICRO-K) 10 MEQ CR capsule Take 1 capsule (10 mEq total) by mouth daily as needed (for potassium repacement when taking lasix). 30 capsule 5  . ramipril (ALTACE) 10 MG capsule Take 2 capsules (20 mg total) by mouth daily. (Patient taking differently: Take 10 mg by mouth 2 (two) times daily. ) 180 capsule 3  . vitamin B-12 (CYANOCOBALAMIN) 500 MCG tablet Take 500 mcg by mouth daily.    . vitamin E 400 UNIT capsule Take 400 Units by mouth daily.     No current facility-administered medications for this encounter.     Allergies:   Simvastatin   Social History:  The patient  reports that she quit smoking about 22 years ago. Her smoking use included cigarettes. She has a 10.00 pack-year smoking history. She has never used smokeless tobacco. She reports that she does not drink alcohol or use drugs.   Family History:  The patient's family history includes Alcohol abuse in her brother and father; Brain cancer in her sister; Breast cancer in her mother and sister; Cancer in her brother; Coronary artery disease in an other family member; Diabetes in her mother; Heart attack in her brother and brother; Heart disease in her brother, brother, and mother; Hypertension in an other family member.    ROS:  Please see the history of present illness.   All other systems are personally reviewed and negative.    Recent Labs: 07/02/2018: BUN 23; Creatinine, Ser 0.91; Hemoglobin 14.1; Magnesium 1.9; Platelets 285; Potassium 4.7; Sodium 141  personally reviewed    Other studies personally reviewed: Additional studies/ records that were reviewed today include: Epic notes, Zio monitor   Zio monitor 07/31/18 Primary rhythm is atrial flutter Multiple atypical atrial flutter circuits are observed as well as afib.  She does have very brief sinus rhythm Average heart rate is 105 bpm Nonsustained ventricular tachycardia is observed No AV block or prolonged pauses   ASSESSMENT AND PLAN:  1. Permanent atrial fibrillation/atypical flutter S/p unsuccessful DCCV on 07/08/18. Options for rhythm control are limited due to age and failure of Tikosyn and amiodarone. Rate control options also limited by BP. Patient is feeling well and asymptomatic.  Continue metoprolol 50 mg BID Continue Eliquis 5 mg BID   This patients CHA2DS2-VASc Score and unadjusted Ischemic Stroke Rate (%  per year) is equal to 11.2 % stroke rate/year from a score of 7  Above score calculated as 1 point each if present [CHF, HTN, DM, Vascular=MI/PAD/Aortic Plaque, Age if 65-74, or Female] Above score calculated as 2 points each if present [Age > 75, or Stroke/TIA/TE]  2. HTN Low today but no symptoms of dizziness or presyncope. No changes today.  3. Chronic systolic CHF No symptoms of fluid overload. Continue ACE and BB.   COVID screen The patient does not have any symptoms that suggest any further testing/ screening at this time.  Social distancing reinforced today.    Follow-up with Dr Rayann Heman in 2 months.  Current medicines are reviewed at length with the patient today.   The patient does not have concerns regarding her medicines.  The following changes were made today: none  Labs/ tests ordered today include:  No orders of the defined types were placed in this encounter.   Patient Risk:  after full review of this patients  clinical status, I feel that they are at moderate risk at this time.   Today, I have spent 15 minutes with the patient with telehealth technology discussing atrial fibrillation, heart monitor results, rate control, and COVID-19 precautions.  Gwenlyn Perking PA-C 08/07/2018 10:58 AM  [facility] [place], [place] [address] [phone]   I hereby voluntarily request, consent and authorize the [facility] and its employed or contracted physicians, physician assistants, nurse practitioners or other licensed health care professionals (the Practitioner), to provide me with telemedicine health care services (the "Services") as deemed necessary by the treating Practitioner. I acknowledge and consent to receive the Services by the Practitioner via telemedicine. I understand that the telemedicine visit will involve communicating with the Practitioner through live audiovisual communication technology and the disclosure of certain medical information by electronic transmission. I acknowledge that I have been given the opportunity to request an in-person assessment or other available alternative prior to the telemedicine visit and am voluntarily participating in the telemedicine visit.   I understand that I have the right to withhold or withdraw my consent to the use of telemedicine in the course of my care at any time, without affecting my right to future care or treatment, and that the Practitioner or I may terminate the telemedicine visit at any time. I understand that I have the right to inspect all information obtained and/or recorded in the course of the telemedicine visit and may receive copies of available information for a reasonable fee.  I understand that some of the potential risks of receiving the Services via telemedicine include:   Delay or interruption in medical evaluation due to technological equipment failure or disruption;   Information transmitted may not be sufficient (e.g. poor resolution of images) to allow for appropriate medical decision making by the Practitioner; and/or  In rare instances, security protocols could fail, causing a breach of personal health information.   Furthermore, I acknowledge that it is my responsibility to provide information about my medical history, conditions and care that is complete and accurate to the best of my ability. I acknowledge that Practitioner's advice, recommendations, and/or decision may be based on factors not within their control, such as incomplete or inaccurate data provided by me or distortions of diagnostic images or specimens that may result from electronic transmissions. I understand that the practice of medicine is not an exact science and that Practitioner makes no warranties or guarantees regarding treatment outcomes. I acknowledge  that I will receive a copy of this consent concurrently upon execution via email to the email address I last provided but may also request a printed copy by calling the office of the [facility].  I understand that my insurance will be billed for this visit.   I have read or had this consent read to me.  I understand the contents of this consent, which adequately explains the benefits and risks of the Services being provided via telemedicine.  I have been provided ample opportunity to ask questions regarding this consent and the Services and have had my questions answered to my satisfaction.  I give my informed consent for the services to be provided through the use of telemedicine in my medical care  By participating in this telemedicine visit I agree to the above.

## 2018-09-08 ENCOUNTER — Other Ambulatory Visit: Payer: Self-pay

## 2018-09-08 ENCOUNTER — Ambulatory Visit (INDEPENDENT_AMBULATORY_CARE_PROVIDER_SITE_OTHER): Payer: PPO | Admitting: Family Medicine

## 2018-09-08 DIAGNOSIS — F419 Anxiety disorder, unspecified: Secondary | ICD-10-CM

## 2018-09-08 DIAGNOSIS — F329 Major depressive disorder, single episode, unspecified: Secondary | ICD-10-CM

## 2018-09-08 MED ORDER — SERTRALINE HCL 50 MG PO TABS: ORAL_TABLET | ORAL | 5 refills | Status: DC

## 2018-09-08 NOTE — Progress Notes (Signed)
Patient ID: [name], female   DOB: 1929-09-30, 85 y.o.   MRN: [id number]  This visit type was conducted due to national recommendations for restrictions regarding the COVID-19 pandemic in an effort to limit this patient's exposure and mitigate transmission in our community.   Virtual Visit via Telephone Note  I connected with [name] on 09/08/18 at  3:30 PM EDT by telephone and verified that I am speaking with the correct person using two identifiers.   I discussed the limitations, risks, security and privacy concerns of performing an evaluation and management service by telephone and the availability of in person appointments. I also discussed with the patient that there may be a patient responsible charge related to this service. The patient expressed understanding and agreed to proceed.  Location patient: home Location provider: work or home office Participants present for the call: patient, provider Patient did not have a visit in the prior 7 days to address this/these issue(s).   History of Present Illness: Patient called with concerns over some anxiety symptoms and anxiousness.  She states she has been having progressive symptoms over the past year.  She lives alone.  She has 1 child alive and 3 grandchildren.  They assist some with her care.  Her daughter died of ovarian cancer about a year ago.  She has had difficulties adjusting to that.  She has some depression symptoms but feels more anxious than anything.  This is pervasive and daily and especially bothersome at night.  She has reduced her caffeine consumption.  No alcohol use.  Denies any suicidal ideation or suicidal thoughts.   Observations/Objective: Patient sounds cheerful and well on the phone. I do not appreciate any SOB. Speech and thought processing are grossly intact. Patient reported vitals:  Assessment and Plan: Mixed anxiety and depression symptoms  -We have discussed some nonpharmacologic behavioral  interventions to try to help her anxiety such as reducing news consumption and trying to stay busy with activities and hobbies -Discussed trial of sertraline 50 mg 1/2 tablet once daily for 3 to 4 days and then increase to once daily -Follow-up in 3 weeks for phone follow-up visit -We reviewed potential side effects from medication  Follow Up Instructions:  3 weeks for phone follow-up   99441 5-10 99442 11-20 9443 21-30 I did not refer this patient for an OV in the next 24 hours for this/these issue(s).  I discussed the assessment and treatment plan with the patient. The patient was provided an opportunity to ask questions and all were answered. The patient agreed with the plan and demonstrated an understanding of the instructions.   The patient was advised to call back or seek an in-person evaluation if the symptoms worsen or if the condition fails to improve as anticipated.  I provided 18 minutes of non-face-to-face time during this encounter.   [name], MD

## 2018-09-16 ENCOUNTER — Other Ambulatory Visit (HOSPITAL_COMMUNITY): Payer: Self-pay | Admitting: *Deleted

## 2018-09-16 ENCOUNTER — Telehealth: Payer: Self-pay | Admitting: Pharmacist

## 2018-09-16 DIAGNOSIS — I4821 Permanent atrial fibrillation: Secondary | ICD-10-CM

## 2018-09-16 DIAGNOSIS — E7800 Pure hypercholesterolemia, unspecified: Secondary | ICD-10-CM

## 2018-09-16 DIAGNOSIS — Z5181 Encounter for therapeutic drug level monitoring: Secondary | ICD-10-CM

## 2018-09-16 MED ORDER — ATORVASTATIN CALCIUM 10 MG PO TABS: 5.0000 mg | ORAL_TABLET | Freq: Every day | ORAL | 1 refills | Status: DC

## 2018-09-16 MED ORDER — APIXABAN 5 MG PO TABS: 5.0000 mg | ORAL_TABLET | Freq: Two times a day (BID) | ORAL | 0 refills | Status: DC

## 2018-09-16 NOTE — Telephone Encounter (Signed)
Patient needs refill on Eliquis. Also needs labs- set up for CBC and CMP on 6/18.  Patient needs refill on atorvastatin as well. Will route to refill pool

## 2018-09-29 ENCOUNTER — Other Ambulatory Visit: Payer: Self-pay

## 2018-09-29 ENCOUNTER — Ambulatory Visit (INDEPENDENT_AMBULATORY_CARE_PROVIDER_SITE_OTHER): Payer: PPO | Admitting: Family Medicine

## 2018-09-29 DIAGNOSIS — F419 Anxiety disorder, unspecified: Secondary | ICD-10-CM

## 2018-09-29 NOTE — Progress Notes (Signed)
Patient ID: [name], female   DOB: Aug 04, 1929, 85 y.o.   MRN: [id number]  This visit type was conducted due to national recommendations for restrictions regarding the COVID-19 pandemic in an effort to limit this patient's exposure and mitigate transmission in our community.   Virtual Visit via Telephone Note  I connected with [name] on 09/29/18 at  9:00 AM EDT by telephone and verified that I am speaking with the correct person using two identifiers.   I discussed the limitations, risks, security and privacy concerns of performing an evaluation and management service by telephone and the availability of in person appointments. I also discussed with the patient that there may be a patient responsible charge related to this service. The patient expressed understanding and agreed to proceed.  Location patient: home Location provider: work or home office Participants present for the call: patient, provider Patient did not have a visit in the prior 7 days to address this/these issue(s).   History of Present Illness:  Follow-up regarding recent anxiety and depression symptoms.  She was having predominantly anxiety symptoms and not sleeping well.  We started low-dose sertraline 25 mg daily with plans to build this up to 50 mg.  However, at just 25 mg she saw dramatic improvement.  She states she is sleeping better.  She feels less anxious.  No depression symptoms.  Overall, she is very pleased with the effect of the medication.  Feels more engaging with usual daily activities.  Denies any side effects from medication.   Observations/Objective: Patient sounds cheerful and well on the phone. I do not appreciate any SOB. Speech and thought processing are grossly intact. Patient reported vitals:  Assessment and Plan: Mixed anxiety and depression improved on sertraline  -Continue sertraline 25 mg daily -Patient is overdue for labs including lipids but she is very reluctant to come in at  this time.  We recommended follow-up for labs hopefully within the next few months if things are settling down in regards to current pandemic and she agrees with this  Follow Up Instructions:  -As above  99441 5-10 99442 11-20 99443 21-30 I did not refer this patient for an OV in the next 24 hours for this/these issue(s).  I discussed the assessment and treatment plan with the patient. The patient was provided an opportunity to ask questions and all were answered. The patient agreed with the plan and demonstrated an understanding of the instructions.   The patient was advised to call back or seek an in-person evaluation if the symptoms worsen or if the condition fails to improve as anticipated.  I provided 13 minutes of non-face-to-face time during this encounter.   Carolann Littler, MD

## 2018-10-08 ENCOUNTER — Telehealth: Payer: Self-pay

## 2018-10-08 ENCOUNTER — Telehealth: Payer: Self-pay | Admitting: *Deleted

## 2018-10-08 NOTE — Telephone Encounter (Signed)
    COVID-19 Pre-Screening Questions:  . In the past 7 to 10 days have you had a cough,  shortness of breath, headache, congestion, fever (100 or greater) body aches, chills, sore throat, or sudden loss of taste or sense of smell? . Have you been around anyone with known Covid 19. . Have you been around anyone who is awaiting Covid 19 test results in the past 7 to 10 days? . Have you been around anyone who has been exposed to Covid 19, or has mentioned symptoms of Covid 19 within the past 7 to 10 days?  If you have any concerns/questions about symptoms patients report during screening (either on the phone or at threshold). Contact the provider seeing the patient or DOD for further guidance.  If neither are available contact a member of the leadership team.            Contacted patient via phone call. Covid 19 questions were answered no. Has a mask. KB  

## 2018-10-08 NOTE — Telephone Encounter (Signed)
Spoke with pt regarding appt on6/18/20. Pt stated she does not have acces to a smart device. Pt was advise to keep phonevisit and check vitals prior to appt.

## 2018-10-09 ENCOUNTER — Encounter: Payer: Self-pay | Admitting: Internal Medicine

## 2018-10-09 ENCOUNTER — Telehealth (INDEPENDENT_AMBULATORY_CARE_PROVIDER_SITE_OTHER): Payer: PPO | Admitting: Internal Medicine

## 2018-10-09 ENCOUNTER — Other Ambulatory Visit: Payer: PPO | Admitting: *Deleted

## 2018-10-09 ENCOUNTER — Other Ambulatory Visit: Payer: Self-pay

## 2018-10-09 VITALS — BP 113/57 | HR 64 | Ht 65.0 in | Wt 198.0 lb

## 2018-10-09 DIAGNOSIS — I4819 Other persistent atrial fibrillation: Secondary | ICD-10-CM | POA: Diagnosis not present

## 2018-10-09 DIAGNOSIS — Z5181 Encounter for therapeutic drug level monitoring: Secondary | ICD-10-CM | POA: Diagnosis not present

## 2018-10-09 DIAGNOSIS — E7800 Pure hypercholesterolemia, unspecified: Secondary | ICD-10-CM | POA: Diagnosis not present

## 2018-10-09 DIAGNOSIS — I4821 Permanent atrial fibrillation: Secondary | ICD-10-CM | POA: Diagnosis not present

## 2018-10-09 DIAGNOSIS — I10 Essential (primary) hypertension: Secondary | ICD-10-CM

## 2018-10-09 LAB — COMPREHENSIVE METABOLIC PANEL
ALT: 11 IU/L (ref 0–32)
AST: 17 IU/L (ref 0–40)
Albumin/Globulin Ratio: 1.6 (ref 1.2–2.2)
Albumin: 4.3 g/dL (ref 3.6–4.6)
Alkaline Phosphatase: 80 IU/L (ref 39–117)
BUN/Creatinine Ratio: 18 (ref 12–28)
BUN: 14 mg/dL (ref 8–27)
Bilirubin Total: 0.9 mg/dL (ref 0.0–1.2)
CO2: 25 mmol/L (ref 20–29)
Calcium: 10.1 mg/dL (ref 8.7–10.3)
Chloride: 98 mmol/L (ref 96–106)
Creatinine, Ser: 0.76 mg/dL (ref 0.57–1.00)
GFR calc Af Amer: 82 mL/min/{1.73_m2} (ref >59)
GFR calc non Af Amer: 71 mL/min/{1.73_m2} (ref >59)
Globulin, Total: 2.7 g/dL (ref 1.5–4.5)
Glucose: 93 mg/dL (ref 65–99)
Potassium: 4.9 mmol/L (ref 3.5–5.2)
Sodium: 137 mmol/L (ref 134–144)
Total Protein: 7 g/dL (ref 6.0–8.5)

## 2018-10-09 LAB — CBC
Hematocrit: 44.7 % (ref 34.0–46.6)
Hemoglobin: 15.2 g/dL (ref 11.1–15.9)
MCH: 30.8 pg (ref 26.6–33.0)
MCHC: 34 g/dL (ref 31.5–35.7)
MCV: 91 fL (ref 79–97)
Platelets: 358 10*3/uL (ref 150–450)
RBC: 4.94 x10E6/uL (ref 3.77–5.28)
RDW: 13.4 % (ref 11.7–15.4)
WBC: 10.6 10*3/uL (ref 3.4–10.8)

## 2018-10-09 LAB — LIPID PANEL
Chol/HDL Ratio: 3.6 ratio (ref 0.0–4.4)
Cholesterol, Total: 144 mg/dL (ref 100–199)
HDL: 40 mg/dL (ref >39)
LDL Calculated: 72 mg/dL (ref 0–99)
Triglycerides: 159 mg/dL — ABNORMAL HIGH (ref 0–149)
VLDL Cholesterol Cal: 32 mg/dL (ref 5–40)

## 2018-10-09 NOTE — Addendum Note (Signed)
Addended by: Willeen Cass A on: 10/09/2018 11:01 AM   Modules accepted: Orders

## 2018-10-09 NOTE — Progress Notes (Signed)
Electrophysiology TeleHealth Note  Due to national recommendations of social distancing due to [place] 19, an audio telehealth visit is felt to be most appropriate for this patient at this time.  Verbal consent was obtained by me for the telehealth visit today.  The patient does not have capability for a virtual visit.  A phone visit is therefore required today.   Date:  10/09/2018   ID:  [name], DOB 1930-02-24, MRN [id number]  Location: patient's home  Provider location:  [facility]  Evaluation Performed: Follow-up visit  PCP:  Eulas Post, MD   Electrophysiologist:  Dr Rayann Heman  Chief Complaint:  palpitations  History of Present Illness:    [name] is a 85 y.o. female who presents via telehealth conferencing today.  Since last being seen in our clinic, the patient reports doing very well.  "I think my heart is probably out of rhythm.  I feel much better since I am off of tikosyn". Stable SOB. Today, she denies symptoms of palpitations, chest pain,  lower extremity edema, dizziness, presyncope, or syncope.  The patient is otherwise without complaint today.  The patient denies symptoms of fevers, chills, cough, or new SOB worrisome for COVID 19.  Past Medical History:  Diagnosis Date  . Anemia   . CAD 09/24/2008   AMI in 1998 tx with PCI to LAD;  myoview 1/12:  no ischemia, EF 45%  . CARDIOMYOPATHY 09/24/2008   ischemic;  echo 4/12:  EF 20-25%, mild MR, mod LAE, mod reduced RVSF, mod RVE, mild RAE, mild TR, PASP 40  . CAROTID BRUIT 09/24/2008  . DIABETES MELLITUS 09/24/2008  . Edema 09/24/2008  . GERD (gastroesophageal reflux disease)   . HYPERLIPIDEMIA 09/24/2008  . HYPERTENSION 09/24/2008  . MI 09/24/2008  . Osteopenia   . Persistent atrial fibrillation 09/24/2008   s/p DCCV x 4 in past;  Amiod d/c'd 2/2 abnormal PFTs;  Tikosyn load 6/12 with DCCV  . Tachycardia-bradycardia [facility])     Past Surgical History:  Procedure Laterality Date  . CARDIOVERSION N/A 02/03/2015    Procedure: CARDIOVERSION;  Surgeon: Dorothy Spark, MD;  Location: [facility] ENDOSCOPY;  Service: Cardiovascular;  Laterality: N/A;  . CARDIOVERSION N/A 07/08/2018   Procedure: CARDIOVERSION;  Surgeon: Thayer Headings, MD;  Location: [facility] ENDOSCOPY;  Service: Cardiovascular;  Laterality: N/A;  getting INR prior  . ELECTROPHYSIOLOGIC STUDY N/A 10/15/2014   PVI and CTI ablation by Dr Rayann Heman  . HIP FRACTURE SURGERY  2006  . KNEE SURGERY  2008   broken knee  . LOOP RECORDER IMPLANT N/A 11/06/2012   Procedure: LOOP RECORDER IMPLANT;  Surgeon: Thompson Grayer, MD;  Location: [facility] CATH LAB;  Service: Cardiovascular;  Laterality: N/A;Medtronic LinQ implanted by Dr Rayann Heman for palpitaitons and dizziness  . TEE WITHOUT CARDIOVERSION N/A 10/15/2014   Procedure: TRANSESOPHAGEAL ECHOCARDIOGRAM (TEE);  Surgeon: Thayer Headings, MD;  Location: [facility] ENDOSCOPY;  Service: Cardiovascular;  Laterality: N/A;    Current Outpatient Medications  Medication Sig Dispense Refill  . apixaban (ELIQUIS) 5 MG TABS tablet Take 1 tablet (5 mg total) by mouth 2 (two) times daily. 180 tablet 0  . aspirin 81 MG EC tablet Take 81 mg by mouth every evening.     Marland Kitchen atorvastatin (LIPITOR) 10 MG tablet Take 0.5 tablets (5 mg total) by mouth daily after supper. 45 tablet 1  . Blood Glucose Monitoring Suppl (ONE TOUCH ULTRA SYSTEM KIT) w/Device KIT 1 kit by Does not apply route once. 1  each 0  . Calcium Carb-Cholecalciferol (CALCIUM 600+D3 PO) Take 1 tablet by mouth daily.    . furosemide (LASIX) 40 MG tablet Take 1 tablet (40 mg total) by mouth daily as needed for fluid. 30 tablet 5  . glucose blood test strip 1 each by Other route as needed for other. Use as instructed with One Touch glucometer. 100 each 11  . Magnesium Oxide 400 MG CAPS Take 1 capsule (400 mg total) by mouth daily. (Patient taking differently: Take 400 mg by mouth every evening. ) 90 capsule 3  . metoprolol tartrate (LOPRESSOR) 100 MG tablet Take 50 mg by mouth 2 (two) times  daily.    . Multiple Vitamin (MULTIVITAMIN WITH MINERALS) TABS tablet Take 1 tablet by mouth daily. One-A-Day    . potassium chloride (MICRO-K) 10 MEQ CR capsule Take 1 capsule (10 mEq total) by mouth daily as needed (for potassium repacement when taking lasix). 30 capsule 5  . ramipril (ALTACE) 10 MG capsule Take 2 capsules (20 mg total) by mouth daily. (Patient taking differently: Take 10 mg by mouth 2 (two) times daily. ) 180 capsule 3  . sertraline (ZOLOFT) 50 MG tablet Take 25 mg by mouth daily.    . vitamin B-12 (CYANOCOBALAMIN) 500 MCG tablet Take 500 mcg by mouth daily.    . vitamin E 400 UNIT capsule Take 400 Units by mouth daily.     No current facility-administered medications for this visit.     Allergies:   Simvastatin   Social History:  The patient  reports that she quit smoking about 22 years ago. Her smoking use included cigarettes. She has a 10.00 pack-year smoking history. She has never used smokeless tobacco. She reports that she does not drink alcohol or use drugs.   Family History:  The patient's  family history includes Alcohol abuse in her brother and father; Brain cancer in her sister; Breast cancer in her mother and sister; Cancer in her brother; Coronary artery disease in an other family member; Diabetes in her mother; Heart attack in her brother and brother; Heart disease in her brother, brother, and mother; Hypertension in an other family member.   ROS:  Please see the history of present illness.   All other systems are personally reviewed and negative.    Exam:    Vital Signs:  BP (!) 113/57   Pulse 64   Ht '5\' 5"'  (1.651 m)   Wt 198 lb (89.8 kg)   BMI 32.95 kg/m   Well sounding today   Labs/Other Tests and Data Reviewed:    Recent Labs: 07/02/2018: BUN 23; Creatinine, Ser 0.91; Hemoglobin 14.1; Magnesium 1.9; Platelets 285; Potassium 4.7; Sodium 141   Wt Readings from Last 3 Encounters:  10/09/18 198 lb (89.8 kg)  07/02/18 199 lb (90.3 kg)  06/25/18  198 lb (89.8 kg)     ASSESSMENT & PLAN:    1.  Atrial afib (persistent), atypical atrial flutter I would advise rate control long term. She has failed cardioversion. On eliquis for chads2vasc score of 7.  2. HTN Stable No change required today  3. Chronic systolic dysfunction Stable No change required today   Follow-up:  Every 3 months in AF clinic I will see as needed   Patient Risk:  after full review of this patients clinical status, I feel that they are at moderate risk at this time.  Today, I have spent 15 minutes with the patient with telehealth technology discussing arrhythmia management .  Army Fossa, MD  10/09/2018 2:59 PM     [place] [address] [place] [address] [phone] (office) [phone] (fax)

## 2018-12-02 ENCOUNTER — Other Ambulatory Visit: Payer: Self-pay | Admitting: Pharmacist

## 2018-12-02 MED ORDER — APIXABAN 5 MG PO TABS: 5.0000 mg | ORAL_TABLET | Freq: Two times a day (BID) | ORAL | 1 refills | Status: DC

## 2018-12-02 NOTE — Progress Notes (Signed)
Age 85, weight 90kg, SCr 0.76 on 10/09/18, last OV June 2020, afib indication

## 2018-12-04 ENCOUNTER — Other Ambulatory Visit: Payer: Self-pay | Admitting: Internal Medicine

## 2018-12-04 MED ORDER — RAMIPRIL 10 MG PO CAPS: 20.0000 mg | ORAL_CAPSULE | Freq: Every day | ORAL | 3 refills | Status: DC

## 2018-12-09 ENCOUNTER — Other Ambulatory Visit: Payer: Self-pay | Admitting: Internal Medicine

## 2018-12-09 MED ORDER — METOPROLOL TARTRATE 100 MG PO TABS: 50.0000 mg | ORAL_TABLET | Freq: Two times a day (BID) | ORAL | 3 refills | Status: DC

## 2018-12-09 MED ORDER — RAMIPRIL 10 MG PO CAPS: 20.0000 mg | ORAL_CAPSULE | Freq: Every day | ORAL | 3 refills | Status: DC

## 2018-12-11 ENCOUNTER — Other Ambulatory Visit (HOSPITAL_COMMUNITY): Payer: Self-pay | Admitting: *Deleted

## 2018-12-27 ENCOUNTER — Emergency Department (HOSPITAL_COMMUNITY): Payer: PPO

## 2018-12-27 ENCOUNTER — Emergency Department (HOSPITAL_COMMUNITY)
Admission: EM | Admit: 2018-12-27 | Discharge: 2018-12-27 | Disposition: A | Discharge status: Home / Self Care | Payer: PPO | Attending: Emergency Medicine | Admitting: Emergency Medicine

## 2018-12-27 ENCOUNTER — Other Ambulatory Visit: Payer: Self-pay

## 2018-12-27 ENCOUNTER — Encounter (HOSPITAL_COMMUNITY): Payer: Self-pay | Admitting: Emergency Medicine

## 2018-12-27 DIAGNOSIS — M10062 Idiopathic gout, left knee: Secondary | ICD-10-CM | POA: Insufficient documentation

## 2018-12-27 DIAGNOSIS — Z79899 Other long term (current) drug therapy: Secondary | ICD-10-CM | POA: Diagnosis not present

## 2018-12-27 DIAGNOSIS — M25462 Effusion, left knee: Secondary | ICD-10-CM | POA: Diagnosis not present

## 2018-12-27 DIAGNOSIS — M109 Gout, unspecified: Secondary | ICD-10-CM

## 2018-12-27 DIAGNOSIS — M25562 Pain in left knee: Secondary | ICD-10-CM | POA: Diagnosis present

## 2018-12-27 DIAGNOSIS — R0902 Hypoxemia: Secondary | ICD-10-CM | POA: Diagnosis not present

## 2018-12-27 DIAGNOSIS — E119 Type 2 diabetes mellitus without complications: Secondary | ICD-10-CM | POA: Diagnosis not present

## 2018-12-27 DIAGNOSIS — R609 Edema, unspecified: Secondary | ICD-10-CM | POA: Diagnosis not present

## 2018-12-27 DIAGNOSIS — I10 Essential (primary) hypertension: Secondary | ICD-10-CM | POA: Insufficient documentation

## 2018-12-27 DIAGNOSIS — Z87891 Personal history of nicotine dependence: Secondary | ICD-10-CM | POA: Diagnosis not present

## 2018-12-27 LAB — SYNOVIAL CELL COUNT + DIFF, W/ CRYSTALS
Lymphocytes-Synovial Fld: 1 % (ref 0–20)
Monocyte-Macrophage-Synovial Fluid: 6 % — ABNORMAL LOW (ref 50–90)
Neutrophil, Synovial: 93 % — ABNORMAL HIGH (ref 0–25)
WBC, Synovial: 23800 /mm3 — ABNORMAL HIGH (ref 0–200)

## 2018-12-27 LAB — CBC
HCT: 45.7 % (ref 36.0–46.0)
Hemoglobin: 14.6 g/dL (ref 12.0–15.0)
MCH: 30.4 pg (ref 26.0–34.0)
MCHC: 31.9 g/dL (ref 30.0–36.0)
MCV: 95 fL (ref 80.0–100.0)
Platelets: 297 10*3/uL (ref 150–400)
RBC: 4.81 MIL/uL (ref 3.87–5.11)
RDW: 14.2 % (ref 11.5–15.5)
WBC: 13.8 10*3/uL — ABNORMAL HIGH (ref 4.0–10.5)
nRBC: 0 % (ref 0.0–0.2)

## 2018-12-27 LAB — SEDIMENTATION RATE: Sed Rate: 17 mm/hr (ref 0–22)

## 2018-12-27 LAB — BASIC METABOLIC PANEL
Anion gap: 11 (ref 5–15)
BUN: 14 mg/dL (ref 8–23)
CO2: 25 mmol/L (ref 22–32)
Calcium: 8.8 mg/dL — ABNORMAL LOW (ref 8.9–10.3)
Chloride: 99 mmol/L (ref 98–111)
Creatinine, Ser: 0.69 mg/dL (ref 0.44–1.00)
GFR calc Af Amer: >60 mL/min (ref >60)
GFR calc non Af Amer: >60 mL/min (ref >60)
Glucose, Bld: 127 mg/dL — ABNORMAL HIGH (ref 70–99)
Potassium: 4 mmol/L (ref 3.5–5.1)
Sodium: 135 mmol/L (ref 135–145)

## 2018-12-27 LAB — URIC ACID: Uric Acid, Serum: 4.9 mg/dL (ref 2.5–7.1)

## 2018-12-27 LAB — C-REACTIVE PROTEIN: CRP: 7.7 mg/dL — ABNORMAL HIGH (ref <1.0)

## 2018-12-27 MED ORDER — MORPHINE SULFATE (PF) 4 MG/ML IV SOLN
4.0000 mg | Freq: Once | INTRAVENOUS | Status: AC
  Administered 2018-12-27: 4 mg via INTRAVENOUS
  Filled 2018-12-27: qty 1

## 2018-12-27 MED ORDER — HYDROCODONE-ACETAMINOPHEN 5-325 MG PO TABS: 1.0000 | ORAL_TABLET | Freq: Four times a day (QID) | ORAL | 0 refills | Status: DC | PRN

## 2018-12-27 MED ORDER — LIDOCAINE-EPINEPHRINE 2 %-1:100000 IJ SOLN
20.0000 mL | Freq: Once | INTRAMUSCULAR | Status: AC
  Administered 2018-12-27: 16:00:00 20 mL
  Filled 2018-12-27: qty 1

## 2018-12-27 MED ORDER — COLCHICINE 0.6 MG PO TABS: 0.6000 mg | ORAL_TABLET | Freq: Two times a day (BID) | ORAL | 0 refills | Status: DC

## 2018-12-27 NOTE — ED Provider Notes (Signed)
[place] DEPT Provider Note   CSN: [id number] Arrival date & time: 12/27/18  1337     History   Chief Complaint Chief Complaint  Patient presents with  . Knee Pain    HPI [name] is a 85 y.o. female.     HPI Patient presents to the emergency with complaints of left knee pain.  Patient states the symptoms a day or 2 ago.  Patient does not recall any injury.  Her symptoms increased last night.  She has had difficulty walking or moving her left knee.  She feels like it is warmer to the touch.  Patient denies any fevers or chills.  She denies any chest pain or shortness of breath.  Patient does have history of prior nerve knee surgery in 2008.  She occasionally has some pain off and on since that injury but nothing significant. Past Medical History:  Diagnosis Date  . Anemia   . CAD 09/24/2008   AMI in 1998 tx with PCI to LAD;  myoview 1/12:  no ischemia, EF 45%  . CARDIOMYOPATHY 09/24/2008   ischemic;  echo 4/12:  EF 20-25%, mild MR, mod LAE, mod reduced RVSF, mod RVE, mild RAE, mild TR, PASP 40  . CAROTID BRUIT 09/24/2008  . DIABETES MELLITUS 09/24/2008  . Edema 09/24/2008  . GERD (gastroesophageal reflux disease)   . HYPERLIPIDEMIA 09/24/2008  . HYPERTENSION 09/24/2008  . MI 09/24/2008  . Osteopenia   . Persistent atrial fibrillation 09/24/2008   s/p DCCV x 4 in past;  Amiod d/c'd 2/2 abnormal PFTs;  Tikosyn load 6/12 with DCCV  . Tachycardia-bradycardia [facility])     Patient Active Problem List   Diagnosis Date Noted  . Atypical atrial flutter ([place]) 01/17/2015  . Tachycardia-bradycardia ([place]) 09/15/2014  . Encounter for therapeutic drug monitoring 09/02/2013  . OSA on CPAP 03/23/2013  . Obesity (BMI 30-39.9) 03/23/2013  . Chronic systolic heart failure ([place]) 11/02/2012  . Dizziness 11/02/2012  . A-fib ([place]) 08/10/2011  . Osteopenia   . Long term (current) use of anticoagulants 09/21/2010  . DIABETES MELLITUS 09/24/2008  . HYPERCHOLESTEROLEMIA  09/24/2008  . Hyperlipidemia 09/24/2008  . Hypertensive cardiovascular disease 09/24/2008  . MI 09/24/2008  . Coronary atherosclerosis 09/24/2008  . CARDIOMYOPATHY 09/24/2008  . PERSISTENT ATRIAL FIBRILLATION 09/24/2008  . EDEMA 09/24/2008  . CAROTID BRUIT 09/24/2008    Past Surgical History:  Procedure Laterality Date  . CARDIOVERSION N/A 02/03/2015   Procedure: CARDIOVERSION;  Surgeon: Dorothy Spark, MD;  Location: [facility] ENDOSCOPY;  Service: Cardiovascular;  Laterality: N/A;  . CARDIOVERSION N/A 07/08/2018   Procedure: CARDIOVERSION;  Surgeon: Thayer Headings, MD;  Location: [facility] ENDOSCOPY;  Service: Cardiovascular;  Laterality: N/A;  getting INR prior  . ELECTROPHYSIOLOGIC STUDY N/A 10/15/2014   PVI and CTI ablation by Dr Rayann Heman  . HIP FRACTURE SURGERY  2006  . KNEE SURGERY  2008   broken knee  . LOOP RECORDER IMPLANT N/A 11/06/2012   Procedure: LOOP RECORDER IMPLANT;  Surgeon: Thompson Grayer, MD;  Location: [facility] CATH LAB;  Service: Cardiovascular;  Laterality: N/A;Medtronic LinQ implanted by Dr Rayann Heman for palpitaitons and dizziness  . TEE WITHOUT CARDIOVERSION N/A 10/15/2014   Procedure: TRANSESOPHAGEAL ECHOCARDIOGRAM (TEE);  Surgeon: Thayer Headings, MD;  Location: [facility] ENDOSCOPY;  Service: Cardiovascular;  Laterality: N/A;     OB History   No obstetric history on file.      Home Medications    Prior to Admission medications   Medication Sig Start Date End  Date Taking? Authorizing Provider  acetaminophen (TYLENOL) 325 MG tablet Take 325 mg by mouth 2 (two) times daily as needed for moderate pain or headache.   Yes [provider]  apixaban (ELIQUIS) 5 MG TABS tablet Take 1 tablet (5 mg total) by mouth 2 (two) times daily. 12/02/18  Yes Josue Hector, MD  aspirin 81 MG EC tablet Take 81 mg by mouth every evening.    Yes [provider]  atorvastatin (LIPITOR) 10 MG tablet Take 0.5 tablets (5 mg total) by mouth daily after supper. 09/16/18  Yes Fenton, Clint R, PA   Calcium Carb-Cholecalciferol (CALCIUM 600+D3 PO) Take 1 tablet by mouth daily.   Yes [provider]  furosemide (LASIX) 40 MG tablet Take 1 tablet (40 mg total) by mouth daily as needed for fluid. 11/15/17  Yes [name], Alinda Sierras, MD  Magnesium Oxide 400 MG CAPS Take 1 capsule (400 mg total) by mouth daily. Patient taking differently: Take 400 mg by mouth every evening.  03/20/17  Yes Isaiah Serge, NP  metoprolol tartrate (LOPRESSOR) 100 MG tablet Take 0.5 tablets (50 mg total) by mouth 2 (two) times daily. 12/09/18  Yes Allred, Jeneen Rinks, MD  Multiple Vitamin (MULTIVITAMIN WITH MINERALS) TABS tablet Take 1 tablet by mouth daily. One-A-Day   Yes [provider]  potassium chloride (MICRO-K) 10 MEQ CR capsule Take 1 capsule (10 mEq total) by mouth daily as needed (for potassium repacement when taking lasix). 11/15/17  Yes [name], Alinda Sierras, MD  ramipril (ALTACE) 10 MG capsule Take 2 capsules (20 mg total) by mouth daily. 12/09/18  Yes Allred, Jeneen Rinks, MD  sertraline (ZOLOFT) 50 MG tablet Take 25 mg by mouth daily.   Yes [provider]  vitamin B-12 (CYANOCOBALAMIN) 500 MCG tablet Take 500 mcg by mouth daily.   Yes [provider]  vitamin E 400 UNIT capsule Take 400 Units by mouth daily.   Yes [provider]  Blood Glucose Monitoring Suppl (ONE TOUCH ULTRA SYSTEM KIT) w/Device KIT 1 kit by Does not apply route once. 09/30/15   [name], Alinda Sierras, MD  colchicine 0.6 MG tablet Take 1 tablet (0.6 mg total) by mouth 2 (two) times daily. 12/27/18   Dorie Rank, MD  glucose blood test strip 1 each by Other route as needed for other. Use as instructed with One Touch glucometer. 09/30/15   [name], Alinda Sierras, MD  HYDROcodone-acetaminophen (NORCO/VICODIN) 5-325 MG tablet Take 1 tablet by mouth every 6 (six) hours as needed. 12/27/18   Dorie Rank, MD    Family History Family History  Problem Relation Age of Onset  . Breast cancer Mother   . Heart disease Mother   .  Diabetes Mother   . Alcohol abuse Father   . Breast cancer Sister   . Brain cancer Sister        cause of death at 20 years old  . Alcohol abuse Brother   . Heart disease Brother   . Heart disease Brother   . Cancer Brother        unknown type  . Coronary artery disease Other   . Hypertension Other   . Heart attack Brother   . Heart attack Brother     Social History Social History   Tobacco Use  . Smoking status: Former Smoker    Packs/day: 0.50    Years: 20.00    Pack years: 10.00    Types: Cigarettes    Quit date: 07/11/1996    Years  since quitting: 22.4  . Smokeless tobacco: Never Used  Substance Use Topics  . Alcohol use: No  . Drug use: No     Allergies   Simvastatin   Review of Systems Review of Systems  All other systems reviewed and are negative.    Physical Exam Updated Vital Signs BP (!) 152/66 (BP Location: Left Arm)   Pulse 86   Temp 98.8 F (37.1 C) (Oral)   Resp 20   Ht 1.702 m (_0 )   Wt 83.9 kg   SpO2 90%   BMI 28.98 kg/m   Physical Exam Vitals signs and nursing note reviewed.  Constitutional:      General: She is not in acute distress.    Appearance: She is well-developed.  HENT:     Head: Normocephalic and atraumatic.     Right Ear: External ear normal.     Left Ear: External ear normal.  Eyes:     General: No scleral icterus.       Right eye: No discharge.        Left eye: No discharge.     Conjunctiva/sclera: Conjunctivae normal.  Neck:     Musculoskeletal: Neck supple.     Trachea: No tracheal deviation.  Cardiovascular:     Rate and Rhythm: Normal rate and regular rhythm.  Pulmonary:     Effort: Pulmonary effort is normal. No respiratory distress.     Breath sounds: Normal breath sounds. No stridor. No wheezing or rales.  Abdominal:     General: Bowel sounds are normal. There is no distension.     Palpations: Abdomen is soft.     Tenderness: There is no abdominal tenderness. There is no guarding or rebound.   Musculoskeletal:        General: Tenderness present.     Left knee: She exhibits decreased range of motion, swelling and effusion. Tenderness found.     Right lower leg: No edema.     Left lower leg: No edema.     Comments: ?mild erythema around left knee, slight increase warmth  Skin:    General: Skin is warm and dry.     Findings: No rash.  Neurological:     Mental Status: She is alert.     Cranial Nerves: No cranial nerve deficit (no facial droop, extraocular movements intact, no slurred speech).     Sensory: No sensory deficit.     Motor: No abnormal muscle tone or seizure activity.     Coordination: Coordination normal.      ED Treatments / Results  Labs (all labs ordered are listed, but only abnormal results are displayed) Labs Reviewed  CBC - Abnormal; Notable for the following components:      Result Value   WBC 13.8 (*)    All other components within normal limits  BASIC METABOLIC PANEL - Abnormal; Notable for the following components:   Glucose, Bld 127 (*)    Calcium 8.8 (*)    All other components within normal limits  C-REACTIVE PROTEIN - Abnormal; Notable for the following components:   CRP 7.7 (*)    All other components within normal limits  SYNOVIAL CELL COUNT + DIFF, W/ CRYSTALS - Abnormal; Notable for the following components:   Appearance-Synovial TURBID (*)    WBC, Synovial 23,800 (*)    Neutrophil, Synovial 93 (*)    Monocyte-Macrophage-Synovial Fluid 6 (*)    All other components within normal limits  BODY FLUID CULTURE  GRAM STAIN  SEDIMENTATION  RATE  URIC ACID  GLUCOSE, BODY FLUID OTHER  PROTEIN, BODY FLUID (OTHER)    EKG None  Radiology Dg Knee Complete 4 Views Left  Result Date: 12/27/2018 CLINICAL DATA:  Left knee pain for 2 days. EXAM: LEFT KNEE - COMPLETE 4+ VIEW COMPARISON:  January 02, 2007 FINDINGS: Osteopenia. No evidence of acute fracture. Post fixation of distal left femoral fracture with stable alignment of the  orthopedic hardware. No evidence of loosening, however there is a small to moderate suprapatellar joint effusion. IMPRESSION: 1. No acute fracture or dislocation identified about the left knee. 2. Hardware intact. 3. Small to moderate suprapatellar joint effusion. Electronically Signed   By: Fidela Salisbury M.D.   On: 12/27/2018 15:10    Procedures .Joint Aspiration/Arthrocentesis  Date/Time: 12/27/2018 4:40 PM Performed by: Dorie Rank, MD Authorized by: Dorie Rank, MD   Consent:    Consent obtained:  Verbal   Consent given by:  Patient   Risks discussed:  Infection, bleeding, nerve damage and pain   Alternatives discussed:  No treatment Location:    Location:  Knee   Knee:  L knee Anesthesia (see MAR for exact dosages):    Anesthesia method:  Local infiltration   Local anesthetic:  Lidocaine 2% WITH epi Procedure details:    Preparation: Patient was prepped and draped in usual sterile fashion     Needle gauge:  18 G   Approach:  Lateral   Aspirate amount:  14 cc   Aspirate characteristics:  Cloudy and yellow   Steroid injected: no     Specimen collected: yes   Post-procedure details:    Dressing:  Adhesive bandage   Patient tolerance of procedure:  Tolerated well, no immediate complications   (including critical care time)  Medications Ordered in ED Medications  morphine 4 MG/ML injection 4 mg (4 mg Intravenous Given 12/27/18 1436)  lidocaine-EPINEPHrine (XYLOCAINE W/EPI) 2 %-1:100000 (with pres) injection 20 mL (20 mLs Infiltration Given by Other 12/27/18 1557)  morphine 4 MG/ML injection 4 mg (4 mg Intravenous Given 12/27/18 1819)     Initial Impression / Assessment and Plan / ED Course  I have reviewed the triage vital signs and the nursing notes.  Pertinent labs & imaging results that were available during my care of the patient were reviewed by me and considered in my medical decision making (see chart for details).  Clinical Course as of Dec 26 2000  Sat Dec 27, 2018  1917 Labs were notable for elevated CRP and slight leukocytosis.  Sed rate is normal.  Patient's joint fluid analysis does show an elevated white blood cell count of 23,800.  93% neutrophils.  Gram stain is negative for any organisms.   [JK]    Clinical Course User Index [JK] Dorie Rank, MD    Patient's joint fluid analysis showed inflammatory changes however  Crystals were  noted.  Symptoms most likely related to gouty arthritis.  Patient be discharged home on colchicine and hydrocodone.  I spoke with Dr. Lyla Glassing and the patient can follow-up in his office. Final Clinical Impressions(s) / ED Diagnoses   Final diagnoses:  Gouty arthritis of left knee    ED Discharge Orders         Ordered    colchicine 0.6 MG tablet  2 times daily,   Status:  Discontinued     12/27/18 1955    HYDROcodone-acetaminophen (NORCO/VICODIN) 5-325 MG tablet  Every 6 hours PRN,   Status:  Discontinued     12/27/18  1955    colchicine 0.6 MG tablet  2 times daily     12/27/18 1959    HYDROcodone-acetaminophen (NORCO/VICODIN) 5-325 MG tablet  Every 6 hours PRN     12/27/18 1959           Dorie Rank, MD 12/27/18 2002

## 2018-12-27 NOTE — ED Triage Notes (Signed)
Patient arrived by [facility] from home. Pt c/o LFT knee pain.   Pt had no trauma or injury to knee. Patient is unable to ambulate on Leg.

## 2018-12-27 NOTE — ED Notes (Signed)
Gram stain results  No organisms seem WBC present Poly and mono nuclear  Tomi Bamberger, MD made aware

## 2018-12-27 NOTE — Discharge Instructions (Signed)
Take the medications as prescribed, follow-up with Dr. Lyla Glassing to make sure you are improving

## 2018-12-30 LAB — GLUCOSE, BODY FLUID OTHER: Glucose, Body Fluid Other: 18 mg/dL

## 2018-12-30 LAB — PROTEIN, BODY FLUID (OTHER): Total Protein, Body Fluid Other: 3.4 g/dL

## 2018-12-31 LAB — BODY FLUID CULTURE
Culture: NO GROWTH
Gram Stain: NONE SEEN
Special Requests: NORMAL

## 2019-01-05 ENCOUNTER — Encounter: Payer: Self-pay | Admitting: Family Medicine

## 2019-01-05 ENCOUNTER — Ambulatory Visit (INDEPENDENT_AMBULATORY_CARE_PROVIDER_SITE_OTHER): Payer: PPO | Admitting: Family Medicine

## 2019-01-05 ENCOUNTER — Other Ambulatory Visit: Payer: Self-pay

## 2019-01-05 VITALS — BP 130/70 | HR 88 | Temp 97.7°F | Wt 188.4 lb

## 2019-01-05 DIAGNOSIS — M10062 Idiopathic gout, left knee: Secondary | ICD-10-CM

## 2019-01-05 DIAGNOSIS — Z23 Encounter for immunization: Secondary | ICD-10-CM | POA: Diagnosis not present

## 2019-01-05 DIAGNOSIS — M109 Gout, unspecified: Secondary | ICD-10-CM | POA: Insufficient documentation

## 2019-01-05 MED ORDER — COLCHICINE 0.6 MG PO TABS: 0.6000 mg | ORAL_TABLET | Freq: Two times a day (BID) | ORAL | 1 refills | Status: DC

## 2019-01-05 NOTE — Patient Instructions (Signed)
Gout  Gout is a condition that causes painful swelling of the joints. Gout is a type of inflammation of the joints (arthritis). This condition is caused by having too much uric acid in the body. Uric acid is a chemical that forms when the body breaks down substances called purines. Purines are important for building body proteins. When the body has too much uric acid, sharp crystals can form and build up inside the joints. This causes pain and swelling. Gout attacks can happen quickly and may be very painful (acute gout). Over time, the attacks can affect more joints and become more frequent (chronic gout). Gout can also cause uric acid to build up under the skin and inside the kidneys. What are the causes? This condition is caused by too much uric acid in your blood. This can happen because:  Your kidneys do not remove enough uric acid from your blood. This is the most common cause.  Your body makes too much uric acid. This can happen with some cancers and cancer treatments. It can also occur if your body is breaking down too many red blood cells (hemolytic anemia).  You eat too many foods that are high in purines. These foods include organ meats and some seafood. Alcohol, especially beer, is also high in purines. A gout attack may be triggered by trauma or stress. What increases the risk? You are more likely to develop this condition if you:  Have a family history of gout.  Are female and middle-aged.  Are female and have gone through menopause.  Are obese.  Frequently drink alcohol, especially beer.  Are dehydrated.  Lose weight too quickly.  Have an organ transplant.  Have lead poisoning.  Take certain medicines, including aspirin, cyclosporine, diuretics, levodopa, and niacin.  Have kidney disease.  Have a skin condition called psoriasis. What are the signs or symptoms? An attack of acute gout happens quickly. It usually occurs in just one joint. The most common place is  the big toe. Attacks often start at night. Other joints that may be affected include joints of the feet, ankle, knee, fingers, wrist, or elbow. Symptoms of this condition may include:  Severe pain.  Warmth.  Swelling.  Stiffness.  Tenderness. The affected joint may be very painful to touch.  Shiny, red, or purple skin.  Chills and fever. Chronic gout may cause symptoms more frequently. More joints may be involved. You may also have white or yellow lumps (tophi) on your hands or feet or in other areas near your joints. How is this diagnosed? This condition is diagnosed based on your symptoms, medical history, and physical exam. You may have tests, such as:  Blood tests to measure uric acid levels.  Removal of joint fluid with a thin needle (aspiration) to look for uric acid crystals.  X-rays to look for joint damage. How is this treated? Treatment for this condition has two phases: treating an acute attack and preventing future attacks. Acute gout treatment may include medicines to reduce pain and swelling, including:  NSAIDs.  Steroids. These are strong anti-inflammatory medicines that can be taken by mouth (orally) or injected into a joint.  Colchicine. This medicine relieves pain and swelling when it is taken soon after an attack. It can be given by mouth or through an IV. Preventive treatment may include:  Daily use of smaller doses of NSAIDs or colchicine.  Use of a medicine that reduces uric acid levels in your blood.  Changes to your diet. You may   need to see a dietitian about what to eat and drink to prevent gout. Follow these instructions at home: During a gout attack   If directed, put ice on the affected area: ? Put ice in a plastic bag. ? Place a towel between your skin and the bag. ? Leave the ice on for 20 minutes, 2-3 times a day.  Raise (elevate) the affected joint above the level of your heart as often as possible.  Rest the joint as much as possible.  If the affected joint is in your leg, you may be given crutches to use.  Follow instructions from your health care provider about eating or drinking restrictions. Avoiding future gout attacks  Follow a low-purine diet as told by your dietitian or health care provider. Avoid foods and drinks that are high in purines, including liver, kidney, anchovies, asparagus, herring, mushrooms, mussels, and beer.  Maintain a healthy weight or lose weight if you are overweight. If you want to lose weight, talk with your health care provider. It is important that you do not lose weight too quickly.  Start or maintain an exercise program as told by your health care provider. Eating and drinking  Drink enough fluids to keep your urine pale yellow.  If you drink alcohol: ? Limit how much you use to:  0-1 drink a day for women.  0-2 drinks a day for men. ? Be aware of how much alcohol is in your drink. In the [place] one drink equals one 12 oz bottle of beer (355 mL) one 5 oz glass of wine (148 mL), or one 1 oz glass of hard liquor (44 mL). General instructions  Take over-the-counter and prescription medicines only as told by your health care provider.  Do not drive or use heavy machinery while taking prescription pain medicine.  Return to your normal activities as told by your health care provider. Ask your health care provider what activities are safe for you.  Keep all follow-up visits as told by your health care provider. This is important. Contact a health care provider if you have:  Another gout attack.  Continuing symptoms of a gout attack after 10 days of treatment.  Side effects from your medicines.  Chills or a fever.  Burning pain when you urinate.  Pain in your lower back or belly. Get help right away if you:  Have severe or uncontrolled pain.  Cannot urinate. Summary  Gout is painful swelling of the joints caused by inflammation.  The most common site of pain is the big  toe, but it can affect other joints in the body.  Medicines and dietary changes can help to prevent and treat gout attacks. This information is not intended to replace advice given to you by your health care provider. Make sure you discuss any questions you have with your health care provider. Document Released: 04/06/2000 Document Revised: 10/30/2017 Document Reviewed: 10/30/2017 Elsevier Patient Education  2020 Elsevier Inc.  

## 2019-01-05 NOTE — Progress Notes (Signed)
Subjective:     Patient ID: [name], female   DOB: 1929-05-13, 85 y.o.   MRN: [id number]  HPI Patient is seen for ER follow-up for acute left knee pain.  She had presented to [facility] long ED on the fifth of this month with onset a couple days previous.  She had surgery for left knee injury 2008.  No prior history of gout or known pseudogout.  She had work-up in the ER with general labs and also had arthrocentesis of the left knee.  Synovial fluid analysis revealed intracellular monosodium urate crystals Gram stain was negative.  Her serum uric acid was 4.9.  She had elevated CRP 7.7.  Sed rate 17.  Patient denied any fevers or chills.  X-rays of the knee revealed no acute fracture and hardware intact from prior surgery.  She was placed on colchicine 0.6 mg twice daily and symptoms fairly quickly improved.  She feels basically back to baseline now except for the fact that she has some mild swelling.  There was no recent injury.  Her chronic problems include hypertension, CAD, atrial fibrillation, hyperlipidemia.  No thiazide use.  She does take furosemide.  Past Medical History:  Diagnosis Date  . Anemia   . CAD 09/24/2008   AMI in 1998 tx with PCI to LAD;  myoview 1/12:  no ischemia, EF 45%  . CARDIOMYOPATHY 09/24/2008   ischemic;  echo 4/12:  EF 20-25%, mild MR, mod LAE, mod reduced RVSF, mod RVE, mild RAE, mild TR, PASP 40  . CAROTID BRUIT 09/24/2008  . DIABETES MELLITUS 09/24/2008  . Edema 09/24/2008  . GERD (gastroesophageal reflux disease)   . HYPERLIPIDEMIA 09/24/2008  . HYPERTENSION 09/24/2008  . MI 09/24/2008  . Osteopenia   . Persistent atrial fibrillation 09/24/2008   s/p DCCV x 4 in past;  Amiod d/c'd 2/2 abnormal PFTs;  Tikosyn load 6/12 with DCCV  . Tachycardia-bradycardia [facility])    Past Surgical History:  Procedure Laterality Date  . CARDIOVERSION N/A 02/03/2015   Procedure: CARDIOVERSION;  Surgeon: Lars Masson, MD;  Location: [facility] ENDOSCOPY;  Service: Cardiovascular;   Laterality: N/A;  . CARDIOVERSION N/A 07/08/2018   Procedure: CARDIOVERSION;  Surgeon: Vesta Mixer, MD;  Location: [facility] ENDOSCOPY;  Service: Cardiovascular;  Laterality: N/A;  getting INR prior  . ELECTROPHYSIOLOGIC STUDY N/A 10/15/2014   PVI and CTI ablation by Dr Johney Frame  . HIP FRACTURE SURGERY  2006  . KNEE SURGERY  2008   broken knee  . LOOP RECORDER IMPLANT N/A 11/06/2012   Procedure: LOOP RECORDER IMPLANT;  Surgeon: Hillis Range, MD;  Location: [facility] CATH LAB;  Service: Cardiovascular;  Laterality: N/A;Medtronic LinQ implanted by Dr Johney Frame for palpitaitons and dizziness  . TEE WITHOUT CARDIOVERSION N/A 10/15/2014   Procedure: TRANSESOPHAGEAL ECHOCARDIOGRAM (TEE);  Surgeon: Vesta Mixer, MD;  Location: [facility] ENDOSCOPY;  Service: Cardiovascular;  Laterality: N/A;    reports that she quit smoking about 22 years ago. Her smoking use included cigarettes. She has a 10.00 pack-year smoking history. She has never used smokeless tobacco. She reports that she does not drink alcohol or use drugs. family history includes Alcohol abuse in her brother and father; Brain cancer in her sister; Breast cancer in her mother and sister; Cancer in her brother; Coronary artery disease in an other family member; Diabetes in her mother; Heart attack in her brother and brother; Heart disease in her brother, brother, and mother; Hypertension in an other family member. Allergies  Allergen Reactions  . Simvastatin Rash  Review of Systems  Constitutional: Negative for chills and fever.  Respiratory: Negative for shortness of breath.   Cardiovascular: Negative for chest pain.  Hematological: Negative for adenopathy.       Objective:   Physical Exam Constitutional:      Appearance: Normal appearance.  Cardiovascular:     Rate and Rhythm: Normal rate.  Pulmonary:     Effort: Pulmonary effort is normal.     Breath sounds: Normal breath sounds.  Musculoskeletal:     Comments: Left knee is slightly  swollen more than the right but no significant effusion.  No warmth.  No erythema.  No ecchymosis.  No localized tenderness.  Neurological:     Mental Status: She is alert.        Assessment:     Recent acute left knee pain probably secondary to acute gout.  Knee tap did reveal monosodium urate crystals.  Patient responded promptly to colchicine    Plan:     -We discussed possible triggering factors for gout including dehydration, prolonged immobility, various food triggers -We gave her a prescription to have some colchicine on hand in case she has another acute flareup. -We have cautioned her against nonsteroidal use with her age and other multiple comorbidities. -Influenza vaccine given  Eulas Post MD [facility]

## 2019-01-09 ENCOUNTER — Other Ambulatory Visit: Payer: Self-pay

## 2019-01-09 ENCOUNTER — Ambulatory Visit (HOSPITAL_COMMUNITY)
Admission: RE | Admit: 2019-01-09 | Discharge: 2019-01-09 | Disposition: A | Discharge status: Home / Self Care | Payer: PPO | Source: Ambulatory Visit | Attending: Physician Assistant | Admitting: Physician Assistant

## 2019-01-09 DIAGNOSIS — I4821 Permanent atrial fibrillation: Secondary | ICD-10-CM

## 2019-01-09 NOTE — Progress Notes (Signed)
Electrophysiology TeleHealth Note   Due to national recommendations of social distancing due to [place] 19, Audio telehealth visit is felt to be most appropriate for this patient at this time.  See consent below from today for patient consent regarding telehealth for the Atrial Fibrillation Clinic. Consent obtained verbally.   Date:  01/09/2019   ID:  [name], DOB 02/29/1930, MRN [id number]  Location: home  Provider location: [address] [place], [place] [address] Evaluation Performed: Follow up  PCP:  Eulas Post, MD  Primary Cardiologist: Dr Johnsie Cancel Primary Electrophysiologist: Dr Rayann Heman   CC: Follow up for atrial fibrillation   History of Present Illness: [name] is a 85 y.o. female who presents via audio conferencing for a telehealth visit today. Patient recently underwent DCCV on 07/08/18 for persistent atrial flutter which was unsuccessful at restoring SR. Patient reports that she feels improved off Tikosyn with less SOB and no palpitations. She wore a Zio patch which reveal almost 100% AF/flutter burden with avg HR 105.   On follow up today, patient reports that she has done very well since stopping dofetilide. She has had no heart racing or palpitations. Her chronic SOB is stable. She was recently seen in the ER for gout in her knee.   Today, she denies symptoms of palpitations, SOB, chest pain, orthopnea, PND, claudication, dizziness, presyncope, syncope, bleeding, or neurologic sequela. The patient is tolerating medications without difficulties and is otherwise without complaint today.   she has a BMI of There is no height or weight on file to calculate BMI..  Patient unable to provide BP today. HR has been in the 70s-80s.  Past Medical History:  Diagnosis Date  . Anemia   . CAD 09/24/2008   AMI in 1998 tx with PCI to LAD;  myoview 1/12:  no ischemia, EF 45%  . CARDIOMYOPATHY 09/24/2008   ischemic;  echo 4/12:  EF 20-25%, mild MR, mod LAE, mod reduced  RVSF, mod RVE, mild RAE, mild TR, PASP 40  . CAROTID BRUIT 09/24/2008  . DIABETES MELLITUS 09/24/2008  . Edema 09/24/2008  . GERD (gastroesophageal reflux disease)   . HYPERLIPIDEMIA 09/24/2008  . HYPERTENSION 09/24/2008  . MI 09/24/2008  . Osteopenia   . Persistent atrial fibrillation 09/24/2008   s/p DCCV x 4 in past;  Amiod d/c'd 2/2 abnormal PFTs;  Tikosyn load 6/12 with DCCV  . Tachycardia-bradycardia [facility])    Past Surgical History:  Procedure Laterality Date  . CARDIOVERSION N/A 02/03/2015   Procedure: CARDIOVERSION;  Surgeon: Dorothy Spark, MD;  Location: [facility] ENDOSCOPY;  Service: Cardiovascular;  Laterality: N/A;  . CARDIOVERSION N/A 07/08/2018   Procedure: CARDIOVERSION;  Surgeon: Thayer Headings, MD;  Location: [facility] ENDOSCOPY;  Service: Cardiovascular;  Laterality: N/A;  getting INR prior  . ELECTROPHYSIOLOGIC STUDY N/A 10/15/2014   PVI and CTI ablation by Dr Rayann Heman  . HIP FRACTURE SURGERY  2006  . KNEE SURGERY  2008   broken knee  . LOOP RECORDER IMPLANT N/A 11/06/2012   Procedure: LOOP RECORDER IMPLANT;  Surgeon: Thompson Grayer, MD;  Location: [facility] CATH LAB;  Service: Cardiovascular;  Laterality: N/A;Medtronic LinQ implanted by Dr Rayann Heman for palpitaitons and dizziness  . TEE WITHOUT CARDIOVERSION N/A 10/15/2014   Procedure: TRANSESOPHAGEAL ECHOCARDIOGRAM (TEE);  Surgeon: Thayer Headings, MD;  Location: [facility] ENDOSCOPY;  Service: Cardiovascular;  Laterality: N/A;     Current Outpatient Medications  Medication Sig Dispense Refill  . acetaminophen (TYLENOL) 325 MG tablet Take 325 mg  by mouth 2 (two) times daily as needed for moderate pain or headache.    Marland Kitchen apixaban (ELIQUIS) 5 MG TABS tablet Take 1 tablet (5 mg total) by mouth 2 (two) times daily. 180 tablet 1  . aspirin 81 MG EC tablet Take 81 mg by mouth every evening.     Marland Kitchen atorvastatin (LIPITOR) 10 MG tablet Take 0.5 tablets (5 mg total) by mouth daily after supper. 45 tablet 1  . Blood Glucose Monitoring Suppl (ONE TOUCH ULTRA SYSTEM KIT)  w/Device KIT 1 kit by Does not apply route once. 1 each 0  . Calcium Carb-Cholecalciferol (CALCIUM 600+D3 PO) Take 1 tablet by mouth daily.    . colchicine 0.6 MG tablet Take 1 tablet (0.6 mg total) by mouth 2 (two) times daily. 30 tablet 1  . furosemide (LASIX) 40 MG tablet Take 1 tablet (40 mg total) by mouth daily as needed for fluid. 30 tablet 5  . glucose blood test strip 1 each by Other route as needed for other. Use as instructed with One Touch glucometer. 100 each 11  . HYDROcodone-acetaminophen (NORCO/VICODIN) 5-325 MG tablet Take 1 tablet by mouth every 6 (six) hours as needed. 12 tablet 0  . Magnesium Oxide 400 MG CAPS Take 1 capsule (400 mg total) by mouth daily. (Patient taking differently: Take 400 mg by mouth every evening. ) 90 capsule 3  . metoprolol tartrate (LOPRESSOR) 100 MG tablet Take 0.5 tablets (50 mg total) by mouth 2 (two) times daily. 90 tablet 3  . Multiple Vitamin (MULTIVITAMIN WITH MINERALS) TABS tablet Take 1 tablet by mouth daily. One-A-Day    . potassium chloride (MICRO-K) 10 MEQ CR capsule Take 1 capsule (10 mEq total) by mouth daily as needed (for potassium repacement when taking lasix). 30 capsule 5  . ramipril (ALTACE) 10 MG capsule Take 2 capsules (20 mg total) by mouth daily. 180 capsule 3  . sertraline (ZOLOFT) 50 MG tablet Take 25 mg by mouth daily.    . vitamin B-12 (CYANOCOBALAMIN) 500 MCG tablet Take 500 mcg by mouth daily.    . vitamin E 400 UNIT capsule Take 400 Units by mouth daily.     No current facility-administered medications for this encounter.     Allergies:   Simvastatin   Social History:  The patient  reports that she quit smoking about 22 years ago. Her smoking use included cigarettes. She has a 10.00 pack-year smoking history. She has never used smokeless tobacco. She reports that she does not drink alcohol or use drugs.   Family History:  The patient's family history includes Alcohol abuse in her brother and father; Brain cancer in her  sister; Breast cancer in her mother and sister; Cancer in her brother; Coronary artery disease in an other family member; Diabetes in her mother; Heart attack in her brother and brother; Heart disease in her brother, brother, and mother; Hypertension in an other family member.    ROS:  Please see the history of present illness.   All other systems are personally reviewed and negative.   Recent Labs: 07/02/2018: Magnesium 1.9 10/09/2018: ALT 11 12/27/2018: BUN 14; Creatinine, Ser 0.69; Hemoglobin 14.6; Platelets 297; Potassium 4.0; Sodium 135  personally reviewed    Other studies personally reviewed: Additional studies/ records that were reviewed today include: Epic notes, Zio monitor   Zio monitor 07/31/18 Primary rhythm is atrial flutter Multiple atypical atrial flutter circuits are observed as well as afib.  She does have very brief sinus rhythm Average heart  rate is 105 bpm Nonsustained ventricular tachycardia is observed No AV block or prolonged pauses   ASSESSMENT AND PLAN:  1. Permanent atrial fibrillation/atypical flutter S/p unsuccessful DCCV on 07/08/18. Patient is feeling well and asymptomatic. Her heart rates have been controlled at home and at her recent OV with her PCP. Continue metoprolol 50 mg BID Continue Eliquis 5 mg BID   This patients CHA2DS2-VASc Score and unadjusted Ischemic Stroke Rate (% per year) is equal to 11.2 % stroke rate/year from a score of 7  Above score calculated as 1 point each if present [CHF, HTN, DM, Vascular=MI/PAD/Aortic Plaque, Age if 65-74, or Female] Above score calculated as 2 points each if present [Age > 75, or Stroke/TIA/TE]  2. HTN Stable, no changes today.   3. Chronic systolic CHF No symptoms of fluid overload. Weight stable. Continue present therapy.    COVID screen The patient does not have any symptoms that suggest any further testing/ screening at this time.  Social distancing reinforced today.    Follow-up with the AF  clinic in 3 months.  Current medicines are reviewed at length with the patient today.   The patient does not have concerns regarding her medicines.  The following changes were made today: none  Labs/ tests ordered today include:  No orders of the defined types were placed in this encounter.   Patient Risk:  after full review of this patients clinical status, I feel that they are at moderate risk at this time.   Today, I have spent 10 minutes with the patient with telehealth technology discussing atrial fibrillation, heart monitor results, rate control, and COVID-19 precautions.  Gwenlyn Perking PA-C 01/09/2019 11:08 AM  [facility] [address] [place], [place] [address] [phone]   I hereby voluntarily request, consent and authorize the [facility] and its employed or contracted physicians, physician assistants, nurse practitioners or other licensed health care professionals (the Practitioner), to provide me with telemedicine health care services (the "Services") as deemed necessary by the treating Practitioner. I acknowledge and consent to receive the Services by the Practitioner via telemedicine. I understand that the telemedicine visit will involve communicating with the Practitioner through live audiovisual communication technology and the disclosure of certain medical information by electronic transmission. I acknowledge that I have been given the opportunity to request an in-person assessment or other available alternative prior to the telemedicine visit and am voluntarily participating in the telemedicine visit.   I understand that I have the right to withhold or withdraw my consent to the use of telemedicine in the course of my care at any time, without affecting my right to future care or treatment, and that the Practitioner or I may terminate the telemedicine visit at any time. I understand that I have the right to inspect all  information obtained and/or recorded in the course of the telemedicine visit and may receive copies of available information for a reasonable fee.  I understand that some of the potential risks of receiving the Services via telemedicine include:   Delay or interruption in medical evaluation due to technological equipment failure or disruption;  Information transmitted may not be sufficient (e.g. poor resolution of images) to allow for appropriate medical decision making by the Practitioner; and/or  In rare instances, security protocols could fail, causing a breach of personal health information.   Furthermore, I acknowledge that it is my responsibility to provide information about my medical history, conditions and care that is complete and  accurate to the best of my ability. I acknowledge that Practitioner's advice, recommendations, and/or decision may be based on factors not within their control, such as incomplete or inaccurate data provided by me or distortions of diagnostic images or specimens that may result from electronic transmissions. I understand that the practice of medicine is not an exact science and that Practitioner makes no warranties or guarantees regarding treatment outcomes. I acknowledge that I will receive a copy of this consent concurrently upon execution via email to the email address I last provided but may also request a printed copy by calling the office of the [facility].  I understand that my insurance will be billed for this visit.   I have read or had this consent read to me.  I understand the contents of this consent, which adequately explains the benefits and risks of the Services being provided via telemedicine.  I have been provided ample opportunity to ask questions regarding this consent and the Services and have had my questions answered to my satisfaction.  I give my informed consent for the services to be provided through the use of telemedicine in  my medical care  By participating in this telemedicine visit I agree to the above.

## 2019-02-02 ENCOUNTER — Other Ambulatory Visit (HOSPITAL_COMMUNITY): Payer: Self-pay

## 2019-02-02 MED ORDER — ATORVASTATIN CALCIUM 10 MG PO TABS: 5.0000 mg | ORAL_TABLET | Freq: Every day | ORAL | 1 refills | Status: DC

## 2019-03-02 ENCOUNTER — Telehealth: Payer: Self-pay | Admitting: *Deleted

## 2019-03-02 NOTE — Telephone Encounter (Signed)
Copied from [facility] [id number]. Topic: General - Other >> Mar 02, 2019  3:20 PM Rainey Pines A wrote: Patients daughter stated patient may need a maintenance medication in regards to gout settling in her knee.  Patient is still getting flare up even after medication that was prescribed. Please advise. Best contact [phone]

## 2019-03-02 NOTE — Telephone Encounter (Signed)
Set up follow up .  We need to discuss possible prophylactic medications.

## 2019-03-03 NOTE — Telephone Encounter (Signed)
Attempted to reach pt, no answer. Left vm to call back  

## 2019-03-05 ENCOUNTER — Other Ambulatory Visit: Payer: Self-pay | Admitting: *Deleted

## 2019-03-06 ENCOUNTER — Telehealth: Payer: Self-pay | Admitting: *Deleted

## 2019-03-06 NOTE — Telephone Encounter (Signed)
Since the colchicine is a prn drug do not think this will be a problem.  Can cause myalgias.

## 2019-03-06 NOTE — Telephone Encounter (Signed)
Please advise, pharmacy wanted to bring it to your attention that there was a drug interaction alert between pt's atorvastatin (that was called in on 02/02/19 by looks like someone at the Afib clinic) and colchicine. Please advise

## 2019-03-06 NOTE — Telephone Encounter (Signed)
Copied from [place] [id number]. Topic: General - Inquiry >> Mar 06, 2019  8:35 AM Mathis Bud wrote: Reason for CRM: Estill Bamberg called from [place] a drug interaction  [phone]

## 2019-03-06 NOTE — Telephone Encounter (Signed)
Called pharmacy and spoke with pharmacist and informed them of Dr. Erick Blinks message. Nothing further is needed

## 2019-03-13 ENCOUNTER — Other Ambulatory Visit: Payer: Self-pay

## 2019-04-09 ENCOUNTER — Other Ambulatory Visit: Payer: Self-pay | Admitting: *Deleted

## 2019-04-09 MED ORDER — APIXABAN 5 MG PO TABS: 5.0000 mg | ORAL_TABLET | Freq: Two times a day (BID) | ORAL | 1 refills | Status: DC

## 2019-04-09 NOTE — Telephone Encounter (Signed)
Prescription refill request for Eliquis received.  Last office visit: 01/09/2019 Scr: 0.69, 12/27/2018 Age: 85 y.o. Weight: 85.5 kg  Prescription refill sent.

## 2019-04-13 ENCOUNTER — Other Ambulatory Visit: Payer: Self-pay

## 2019-04-13 MED ORDER — APIXABAN 5 MG PO TABS: 5.0000 mg | ORAL_TABLET | Freq: Two times a day (BID) | ORAL | 1 refills | Status: DC

## 2019-04-13 NOTE — Telephone Encounter (Signed)
Resent rx to pharmacy.

## 2019-06-17 ENCOUNTER — Telehealth: Payer: Self-pay | Admitting: Family Medicine

## 2019-06-17 ENCOUNTER — Other Ambulatory Visit: Payer: Self-pay

## 2019-06-17 ENCOUNTER — Encounter: Payer: Self-pay | Admitting: Family Medicine

## 2019-06-17 ENCOUNTER — Telehealth (INDEPENDENT_AMBULATORY_CARE_PROVIDER_SITE_OTHER): Payer: PPO | Admitting: Family Medicine

## 2019-06-17 DIAGNOSIS — R05 Cough: Secondary | ICD-10-CM

## 2019-06-17 DIAGNOSIS — R600 Localized edema: Secondary | ICD-10-CM

## 2019-06-17 DIAGNOSIS — R053 Chronic cough: Secondary | ICD-10-CM

## 2019-06-17 MED ORDER — FUROSEMIDE 40 MG PO TABS: 40.0000 mg | ORAL_TABLET | Freq: Every day | ORAL | 5 refills | Status: DC | PRN

## 2019-06-17 MED ORDER — POTASSIUM CHLORIDE ER 10 MEQ PO CPCR: 10.0000 meq | ORAL_CAPSULE | Freq: Every day | ORAL | 5 refills | Status: DC | PRN

## 2019-06-17 NOTE — Telephone Encounter (Signed)
Appointment scheduled.

## 2019-06-17 NOTE — Progress Notes (Signed)
This visit type was conducted due to national recommendations for restrictions regarding the COVID-19 pandemic in an effort to limit this patient's exposure and mitigate transmission in our community.   Virtual Visit via Telephone Note  I connected with [name] on 06/17/19 at  5:15 PM EST by telephone and verified that I am speaking with the correct person using two identifiers.   I discussed the limitations, risks, security and privacy concerns of performing an evaluation and management service by telephone and the availability of in person appointments. I also discussed with the patient that there may be a patient responsible charge related to this service. The patient expressed understanding and agreed to proceed.  Location patient: home Location provider: work or home office Participants present for the call: patient, provider Patient did not have a visit in the prior 7 days to address this/these issue(s).   History of Present Illness:   [name] has history of tachybradycardia syndrome, atrial fibrillation, osteopenia, obstructive sleep apnea, obesity, hyperlipidemia, history of gout, chronic systolic heart failure.    She called with about 2-week history of some increased bilateral leg edema.  She states her edema is relatively symmetric.  She did run out of her Lasix about a week ago but apparently had some swelling even prior to that.  She denies any dyspnea.  No orthopnea.  She is not currently getting any daily weights.  Denies any chest pains.  She also relates approximately 49-month history of some mostly dry cough.  Good appetite.  No hemoptysis.  No wheezing.  No active GERD symptoms.  She does states she is having some clear daily postnasal drip symptoms.  Has not taken anything for that.  No known sick contacts.  Quit smoking back in 1998  Past Medical History:  Diagnosis Date  . Anemia   . CAD 09/24/2008   AMI in 1998 tx with PCI to LAD;  myoview 1/12:  no ischemia, EF  45%  . CARDIOMYOPATHY 09/24/2008   ischemic;  echo 4/12:  EF 20-25%, mild MR, mod LAE, mod reduced RVSF, mod RVE, mild RAE, mild TR, PASP 40  . CAROTID BRUIT 09/24/2008  . DIABETES MELLITUS 09/24/2008  . Edema 09/24/2008  . GERD (gastroesophageal reflux disease)   . HYPERLIPIDEMIA 09/24/2008  . HYPERTENSION 09/24/2008  . MI 09/24/2008  . Osteopenia   . Persistent atrial fibrillation 09/24/2008   s/p DCCV x 4 in past;  Amiod d/c'd 2/2 abnormal PFTs;  Tikosyn load 6/12 with DCCV  . Tachycardia-bradycardia [facility])    Past Surgical History:  Procedure Laterality Date  . CARDIOVERSION N/A 02/03/2015   Procedure: CARDIOVERSION;  Surgeon: Dorothy Spark, MD;  Location: [facility] ENDOSCOPY;  Service: Cardiovascular;  Laterality: N/A;  . CARDIOVERSION N/A 07/08/2018   Procedure: CARDIOVERSION;  Surgeon: Thayer Headings, MD;  Location: [facility] ENDOSCOPY;  Service: Cardiovascular;  Laterality: N/A;  getting INR prior  . ELECTROPHYSIOLOGIC STUDY N/A 10/15/2014   PVI and CTI ablation by Dr Rayann Heman  . HIP FRACTURE SURGERY  2006  . KNEE SURGERY  2008   broken knee  . LOOP RECORDER IMPLANT N/A 11/06/2012   Procedure: LOOP RECORDER IMPLANT;  Surgeon: Thompson Grayer, MD;  Location: [facility] CATH LAB;  Service: Cardiovascular;  Laterality: N/A;Medtronic LinQ implanted by Dr Rayann Heman for palpitaitons and dizziness  . TEE WITHOUT CARDIOVERSION N/A 10/15/2014   Procedure: TRANSESOPHAGEAL ECHOCARDIOGRAM (TEE);  Surgeon: Thayer Headings, MD;  Location: [place];  Service: Cardiovascular;  Laterality: N/A;    reports that she  quit smoking about 22 years ago. Her smoking use included cigarettes. She has a 10.00 pack-year smoking history. She has never used smokeless tobacco. She reports that she does not drink alcohol or use drugs. family history includes Alcohol abuse in her brother and father; Brain cancer in her sister; Breast cancer in her mother and sister; Cancer in her brother; Coronary artery disease in an other family member; Diabetes in  her mother; Heart attack in her brother and brother; Heart disease in her brother, brother, and mother; Hypertension in an other family member. Allergies  Allergen Reactions  . Simvastatin Rash    Observations/Objective: Patient sounds cheerful and well on the phone. I do not appreciate any SOB. Speech and thought processing are grossly intact. Patient reported vitals:  Assessment and Plan: #1 increased bilateral leg edema.  She does have history of reported systolic failure but does not report any recent increased dyspnea or orthopnea.  She did run out of her Lasix recently.  Does not sound to be in any distress by phone  -Recommend daily weights and be in touch for weight gain greater than 3 pounds in 1 day or 5 pounds 1 week -Refill Lasix and potassium and get back on daily Lasix -Elevate legs frequently -Follow-up immediately for any increased shortness of breath or persistent leg edema  #2 persistent cough.  She has frequent postnasal drip symptoms which may be triggering.  -We discussed Covid testing although this sounds like low probability.  She will call tomorrow to get set up -Consider over-the-counter Claritin 10 mg daily -If above testing negative consider office follow-up to further assess.  She will need chest x-ray and further evaluation if this persists  Follow Up Instructions:  -As above   [id number] 5-10 [id number] 11-20 [id number] 21-30 I did not refer this patient for an OV in the next 24 hours for this/these issue(s).  I discussed the assessment and treatment plan with the patient. The patient was provided an opportunity to ask questions and all were answered. The patient agreed with the plan and demonstrated an understanding of the instructions.   The patient was advised to call back or seek an in-person evaluation if the symptoms worsen or if the condition fails to improve as anticipated.  I provided 26 minutes of non-face-to-face time during this encounter.   [name], MD

## 2019-06-17 NOTE — Telephone Encounter (Signed)
pt have leg swelling and cough for months; want to know  what Dr. Caryl Never advise  to do  contact [phone]

## 2019-06-24 ENCOUNTER — Ambulatory Visit (INDEPENDENT_AMBULATORY_CARE_PROVIDER_SITE_OTHER): Payer: PPO | Admitting: Family Medicine

## 2019-06-24 ENCOUNTER — Encounter: Payer: Self-pay | Admitting: Family Medicine

## 2019-06-24 ENCOUNTER — Other Ambulatory Visit: Payer: Self-pay

## 2019-06-24 VITALS — BP 130/84 | HR 86 | Temp 97.9°F | Ht 67.0 in | Wt 195.4 lb

## 2019-06-24 DIAGNOSIS — R600 Localized edema: Secondary | ICD-10-CM

## 2019-06-24 DIAGNOSIS — I5022 Chronic systolic (congestive) heart failure: Secondary | ICD-10-CM | POA: Diagnosis not present

## 2019-06-24 DIAGNOSIS — R635 Abnormal weight gain: Secondary | ICD-10-CM

## 2019-06-24 DIAGNOSIS — R5383 Other fatigue: Secondary | ICD-10-CM | POA: Diagnosis not present

## 2019-06-24 NOTE — Patient Instructions (Signed)
Increase the Lasix for one twice daily for 3-5 days and then drop back to one daily  Elevate legs frequently  Try to weigh yourself daily and be in touch for weight gain of 3 pounds in one day or 5 pounds in one week.

## 2019-06-24 NOTE — Progress Notes (Signed)
Subjective:     Patient ID: [name], female   DOB: 06-17-1929, 86 y.o.   MRN: [id number]  HPI   [name] has chronic problems including history of obesity, atrial fibrillation, chronic systolic heart failure, CAD, obstructive sleep apnea, hyperlipidemia.  She is maintained on Eliquis and also takes metoprolol, Lasix 40 mg daily, potassium supplement, ramipril 10 mg.  She presents with some bilateral leg edema which is worsening over the past several weeks.  She has some chronic dyspnea with exertion at baseline.  Chronic mild orthopnea and she tends to sleep propped up.  No recent chest pains.  Her weight is up to 195 pounds today from 188 pounds few months ago.  Echocardiogram 2016 ejection fraction 20-25%.  Denies any recent dietary changes.  She has nonspecific fatigue.  No recent fever, cough, dysuria.   Past Medical History:  Diagnosis Date  . Anemia   . CAD 09/24/2008   AMI in 1998 tx with PCI to LAD;  myoview 1/12:  no ischemia, EF 45%  . CARDIOMYOPATHY 09/24/2008   ischemic;  echo 4/12:  EF 20-25%, mild MR, mod LAE, mod reduced RVSF, mod RVE, mild RAE, mild TR, PASP 40  . CAROTID BRUIT 09/24/2008  . DIABETES MELLITUS 09/24/2008  . Edema 09/24/2008  . GERD (gastroesophageal reflux disease)   . HYPERLIPIDEMIA 09/24/2008  . HYPERTENSION 09/24/2008  . MI 09/24/2008  . Osteopenia   . Persistent atrial fibrillation (HCC) 09/24/2008   s/p DCCV x 4 in past;  Amiod d/c'd 2/2 abnormal PFTs;  Tikosyn load 6/12 with DCCV  . Tachycardia-bradycardia [facility])    Past Surgical History:  Procedure Laterality Date  . CARDIOVERSION N/A 02/03/2015   Procedure: CARDIOVERSION;  Surgeon: Lars Masson, MD;  Location: [facility] ENDOSCOPY;  Service: Cardiovascular;  Laterality: N/A;  . CARDIOVERSION N/A 07/08/2018   Procedure: CARDIOVERSION;  Surgeon: Vesta Mixer, MD;  Location: [facility] ENDOSCOPY;  Service: Cardiovascular;  Laterality: N/A;  getting INR prior  . ELECTROPHYSIOLOGIC STUDY N/A 10/15/2014   PVI and CTI  ablation by Dr Johney Frame  . HIP FRACTURE SURGERY  2006  . KNEE SURGERY  2008   broken knee  . LOOP RECORDER IMPLANT N/A 11/06/2012   Procedure: LOOP RECORDER IMPLANT;  Surgeon: Hillis Range, MD;  Location: [facility] CATH LAB;  Service: Cardiovascular;  Laterality: N/A;Medtronic LinQ implanted by Dr Johney Frame for palpitaitons and dizziness  . TEE WITHOUT CARDIOVERSION N/A 10/15/2014   Procedure: TRANSESOPHAGEAL ECHOCARDIOGRAM (TEE);  Surgeon: Vesta Mixer, MD;  Location: [facility] ENDOSCOPY;  Service: Cardiovascular;  Laterality: N/A;    reports that she quit smoking about 22 years ago. Her smoking use included cigarettes. She has a 10.00 pack-year smoking history. She has never used smokeless tobacco. She reports that she does not drink alcohol or use drugs. family history includes Alcohol abuse in her brother and father; Brain cancer in her sister; Breast cancer in her mother and sister; Cancer in her brother; Coronary artery disease in an other family member; Diabetes in her mother; Heart attack in her brother and brother; Heart disease in her brother, brother, and mother; Hypertension in an other family member. Allergies  Allergen Reactions  . Simvastatin Rash   Wt Readings from Last 3 Encounters:  06/24/19 195 lb 7 oz (88.6 kg)  01/05/19 188 lb 6.4 oz (85.5 kg)  12/27/18 185 lb (83.9 kg)     Review of Systems  Constitutional: Negative for appetite change, chills and fever.  Respiratory: Positive for shortness of breath. Negative  for cough and wheezing.   Cardiovascular: Positive for leg swelling. Negative for chest pain and palpitations.  Gastrointestinal: Negative for abdominal pain.  Neurological: Negative for dizziness and syncope.  Psychiatric/Behavioral: Negative for confusion.       Objective:   Physical Exam Vitals reviewed.  Constitutional:      Appearance: Normal appearance.  Cardiovascular:     Rate and Rhythm: Normal rate.     Comments: Irregular rhythm but rate control Pulmonary:      Effort: Pulmonary effort is normal.     Breath sounds: Normal breath sounds. No wheezing or rales.  Musculoskeletal:     Right lower leg: Edema present.     Left lower leg: Edema present.     Comments: 1+ pitting edema legs bilaterally  Neurological:     General: No focal deficit present.     Mental Status: She is alert and oriented to person, place, and time.        Assessment:     #1 chronic systolic heart failure.  She complains of some increased bilateral leg edema.  She has chronic dyspnea with exertion which does not appear to be much changed.  She has had some gradual weight gain recently.  #2 hypertension stable  #3 history of chronic atrial fibrillation-rate stable on metoprolol and patient maintained on Eliquis    Plan:     -Check basic metabolic panel, BNP level, magnesium level, TSH  -Elevate legs frequently  -Bumped her Lasix up to 40 mg twice daily for 3 to 5 days then drop back to once daily  -Recommend daily weights at home with weighing the same time of day and the same amount of clothing and be in touch for weight gain of 3 pounds or more in 1 day or 5 pounds in 1 week  -We will set up to get back into cardiology soon to reassess.  Question candidate for Entresto?  Eulas Post MD [facility] at [facility]

## 2019-06-25 LAB — BASIC METABOLIC PANEL
BUN: 22 mg/dL (ref 6–23)
CO2: 31 mEq/L (ref 19–32)
Calcium: 9.7 mg/dL (ref 8.4–10.5)
Chloride: 98 mEq/L (ref 96–112)
Creatinine, Ser: 0.81 mg/dL (ref 0.40–1.20)
GFR: 66.77 mL/min (ref >60.00)
Glucose, Bld: 185 mg/dL — ABNORMAL HIGH (ref 70–99)
Potassium: 4.4 mEq/L (ref 3.5–5.1)
Sodium: 139 mEq/L (ref 135–145)

## 2019-06-25 LAB — TSH: TSH: 1.42 u[IU]/mL (ref 0.35–4.50)

## 2019-06-25 LAB — MAGNESIUM: Magnesium: 1.9 mg/dL (ref 1.5–2.5)

## 2019-06-25 LAB — BRAIN NATRIURETIC PEPTIDE: Pro B Natriuretic peptide (BNP): 279 pg/mL — ABNORMAL HIGH (ref 0.0–100.0)

## 2019-06-28 NOTE — Progress Notes (Signed)
Virtual Visit via Telephone Note   This visit type was conducted due to national recommendations for restrictions regarding the COVID-19 Pandemic (e.g. social distancing) in an effort to limit this patient's exposure and mitigate transmission in our community.  Due to her co-morbid illnesses, this patient is at least at moderate risk for complications without adequate follow up.  This format is felt to be most appropriate for this patient at this time.  The patient did not have access to video technology/had technical difficulties with video requiring transitioning to audio format only (telephone).  All issues noted in this document were discussed and addressed.  No physical exam could be performed with this format.  Please refer to the patient's chart for her  consent to telehealth for [facility].   The patient was identified using 2 identifiers.  Date:  06/30/2019   ID:  [name], DOB May 30, 1929, MRN [id number]  Patient Location: Home Provider Location: Office  PCP:  Eulas Post, MD  Cardiologist:  Dr. Johnsie Cancel Electrophysiologist:  Dr. Rayann Heman  Evaluation Performed:  Follow-Up Visit  Chief Complaint:  Routine follow up   History of Present Illness:    [name] is a 86 y.o. female with HTN, chronic CHF (systolic), Restrictive lung disease, CAD (remote PCI to LAD2006), DM, HLD.  Her visit today is for Dr. Rayann Heman, last seen by him via tele-health visit June 2020, at that time she felt like she was still out of rhythm, though feeling better off Tikosyn.  She tells me she thinks she is doing "really pretty good!"  She had some swelling in her legs, though saw her PMD who had her double up on her lasix for a couple days and is resolved.  She had no associated change in breathing.  Denies any rest SOB, she has some mild DOE that is unchanged for years that she attributes to getting older, stating "I am 86 after all".  No dizzy spells, no CP, no near syncope or syncope She  is rarely aware of her Afib, reports minimal palpitations that are not bothersome or limiting to her.  She denies any bleeding or signs of bleeding.  She is quite happy with how she is doing.  She saw her PMD last week, BP was 130/84 and her HR 86.  She was reviewing her medicine list and asked that the hydrocodone and colchicine be taken off, she has not had either in years.  She is following CDC recommendations, leaves her home rarely and always wearing a mask whe she does, her grandson brings her groceries every Friday.   Afib Hx  Diagnosis noted as far back as her Epic record goes (2010) PVI and CTI ablation 2016 Has had several DCCV over the years, last was 07/08/2018 (unsuccessful)  AAD Hx Amiodarone >> stopped 2011, 2/2 abn PFTs Tikosyn  Started 2012 >>> stopped March 2020 with recurrent AFib/failed DCCV June 2021 planned for rate control strategy   Device information MDT ILR implanted 2014 2/2 near syncope and AFib  (is EOS)    Past Medical History:  Diagnosis Date  . Anemia   . CAD 09/24/2008   AMI in 1998 tx with PCI to LAD;  myoview 1/12:  no ischemia, EF 45%  . CARDIOMYOPATHY 09/24/2008   ischemic;  echo 4/12:  EF 20-25%, mild MR, mod LAE, mod reduced RVSF, mod RVE, mild RAE, mild TR, PASP 40  . CAROTID BRUIT 09/24/2008  . DIABETES MELLITUS 09/24/2008  . Edema 09/24/2008  .  GERD (gastroesophageal reflux disease)   . HYPERLIPIDEMIA 09/24/2008  . HYPERTENSION 09/24/2008  . MI 09/24/2008  . Osteopenia   . Persistent atrial fibrillation ([place]) 09/24/2008   s/p DCCV x 4 in past;  Amiod d/c'd 2/2 abnormal PFTs;  Tikosyn load 6/12 with DCCV  . Tachycardia-bradycardia [facility])    Past Surgical History:  Procedure Laterality Date  . CARDIOVERSION N/A 02/03/2015   Procedure: CARDIOVERSION;  Surgeon: Dorothy Spark, MD;  Location: [facility] ENDOSCOPY;  Service: Cardiovascular;  Laterality: N/A;  . CARDIOVERSION N/A 07/08/2018   Procedure: CARDIOVERSION;  Surgeon: Thayer Headings, MD;   Location: [facility] ENDOSCOPY;  Service: Cardiovascular;  Laterality: N/A;  getting INR prior  . ELECTROPHYSIOLOGIC STUDY N/A 10/15/2014   PVI and CTI ablation by Dr Rayann Heman  . HIP FRACTURE SURGERY  2006  . KNEE SURGERY  2008   broken knee  . LOOP RECORDER IMPLANT N/A 11/06/2012   Procedure: LOOP RECORDER IMPLANT;  Surgeon: Thompson Grayer, MD;  Location: [facility] CATH LAB;  Service: Cardiovascular;  Laterality: N/A;Medtronic LinQ implanted by Dr Rayann Heman for palpitaitons and dizziness  . TEE WITHOUT CARDIOVERSION N/A 10/15/2014   Procedure: TRANSESOPHAGEAL ECHOCARDIOGRAM (TEE);  Surgeon: Thayer Headings, MD;  Location: [place];  Service: Cardiovascular;  Laterality: N/A;     Current Meds  Medication Sig  . acetaminophen (TYLENOL) 325 MG tablet Take 325 mg by mouth 2 (two) times daily as needed for moderate pain or headache.  Marland Kitchen apixaban (ELIQUIS) 5 MG TABS tablet Take 1 tablet (5 mg total) by mouth 2 (two) times daily.  Marland Kitchen aspirin 81 MG EC tablet Take 81 mg by mouth every evening.   Marland Kitchen atorvastatin (LIPITOR) 10 MG tablet Take 0.5 tablets (5 mg total) by mouth daily after supper.  . Blood Glucose Monitoring Suppl (ONE TOUCH ULTRA SYSTEM KIT) w/Device KIT 1 kit by Does not apply route once.  . Calcium Carb-Cholecalciferol (CALCIUM 600+D3 PO) Take 1 tablet by mouth daily.  . colchicine 0.6 MG tablet Take 1 tablet (0.6 mg total) by mouth 2 (two) times daily.  . furosemide (LASIX) 40 MG tablet Take 1 tablet (40 mg total) by mouth daily as needed for fluid.  Marland Kitchen glucose blood test strip 1 each by Other route as needed for other. Use as instructed with One Touch glucometer.  Marland Kitchen HYDROcodone-acetaminophen (NORCO/VICODIN) 5-325 MG tablet Take 1 tablet by mouth every 6 (six) hours as needed.  . Magnesium Oxide 400 MG CAPS Take 1 capsule (400 mg total) by mouth daily. (Patient taking differently: Take 400 mg by mouth every evening. )  . metoprolol tartrate (LOPRESSOR) 100 MG tablet Take 0.5 tablets (50 mg total) by mouth 2  (two) times daily.  . Multiple Vitamin (MULTIVITAMIN WITH MINERALS) TABS tablet Take 1 tablet by mouth daily. One-A-Day  . potassium chloride (MICRO-K) 10 MEQ CR capsule Take 1 capsule (10 mEq total) by mouth daily as needed (for potassium repacement when taking lasix).  . ramipril (ALTACE) 10 MG capsule Take 2 capsules (20 mg total) by mouth daily.  . sertraline (ZOLOFT) 50 MG tablet Take 25 mg by mouth daily.  . vitamin B-12 (CYANOCOBALAMIN) 500 MCG tablet Take 500 mcg by mouth daily.  . vitamin E 400 UNIT capsule Take 400 Units by mouth daily.     Allergies:   Simvastatin   Social History   Tobacco Use  . Smoking status: Former Smoker    Packs/day: 0.50    Years: 20.00    Pack years: 10.00  Types: Cigarettes    Quit date: 07/11/1996    Years since quitting: 22.9  . Smokeless tobacco: Never Used  Substance Use Topics  . Alcohol use: No  . Drug use: No     Family Hx: The patient's family history includes Alcohol abuse in her brother and father; Brain cancer in her sister; Breast cancer in her mother and sister; Cancer in her brother; Coronary artery disease in an other family member; Diabetes in her mother; Heart attack in her brother and brother; Heart disease in her brother, brother, and mother; Hypertension in an other family member.  ROS:   Please see the history of present illness.    All other systems reviewed and are negative.   Prior CV studies:   The following studies were reviewed today:  April 2020, long term monitor Primary rhythm is atrial flutter Multiple atypical atrial flutter circuits are observed as well as afib.  She does have very brief sinus rhythm Average heart rate is 105 bpm Nonsustained ventricular tachycardia is observed No AV block or prolonged pauses   10/15/2014: TEE Study Conclusions  - Left ventricle: Systolic function was severely reduced. The  estimated ejection fraction was in the range of 20% to 25%.  - Aortic valve: No evidence  of vegetation.  - Mitral valve: There was moderate regurgitation.  - Left atrium: No evidence of thrombus in the atrial cavity or  appendage. There was spontaneous echo contrast (&quot;smoke&quot;).  - Right ventricle: The cavity size was mildly dilated. Systolic  function was mildly to moderately reduced.  - Tricuspid valve: No evidence of vegetation. There was moderate  regurgitation.    Atrial Fibrillation Ablation 10/15/14 CONCLUSIONS: 1. Sinus rhythm upon presentation.  2. Rotational Angiography reveals a moderate sized left atrium with four separate pulmonary veins without evidence of pulmonary vein stenosis. 3. Successful electrical isolation and anatomical encircling of all four pulmonary veins with radiofrequency current.  4. Cavo-tricuspid isthmus ablation was performed with complete bidirectional isthmus block achieved.  5. Multiple atypical atrial flutter circuits were demonstrated but were not suitable for mapping/ ablation today 6. No early apparent complications    Labs/Other Tests and Data Reviewed:    EKG:  07/08/2018: Atach with variable conduction, 115bpm    Recent Labs: 10/09/2018: ALT 11 12/27/2018: Hemoglobin 14.6; Platelets 297 06/24/2019: BUN 22; Creatinine, Ser 0.81; Magnesium 1.9; Potassium 4.4; Pro B Natriuretic peptide (BNP) 279.0; Sodium 139; TSH 1.42   Recent Lipid Panel Lab Results  Component Value Date/Time   CHOL 144 10/09/2018 12:00 AM   TRIG 159 (H) 10/09/2018 12:00 AM   HDL 40 10/09/2018 12:00 AM   CHOLHDL 3.6 10/09/2018 12:00 AM   CHOLHDL 4 02/22/2014 10:54 AM   LDLCALC 72 10/09/2018 12:00 AM    Wt Readings from Last 3 Encounters:  06/30/19 195 lb (88.5 kg)  06/24/19 195 lb 7 oz (88.6 kg)  01/05/19 188 lb 6.4 oz (85.5 kg)     Objective:    Vital Signs:  BP 130/84   Ht '5\' 6"'  (1.676 m)   Wt 195 lb (88.5 kg)   BMI 31.47 kg/m    She sound very good on the phone.  Very pleasant, speaks in full sentences with a normal pace.   She does not sound SOB or in any distress  ASSESSMENT & PLAN:    1. Longstanding persistent Afib     CHA2DS2Vasc is 7, on Eliquis, appropriately dosed     Planned for rate control strategy going forward  Largely unaware of her Afib  2. Chronic CHF (systolic)     TEE in 9412, LVEF 20-25%      She mentions some intermittent swelling to her lower legs, "infrequently" and managed well with a couple days of extra lasix       She does light house work, takes out her garbage and does her ADLs without difficulty  3. HTN     Looked OK last week at her PMD office       Time:   Today, I have spent 11 minutes with the patient with telehealth technology discussing the above problems.     Medication Adjustments/Labs and Tests Ordered: Current medicines are reviewed at length with the patient today.  Concerns regarding medicines are outlined above.   Tests Ordered: No orders of the defined types were placed in this encounter.   Medication Changes: No orders of the defined types were placed in this encounter.   Follow Up:  Either In Person or Virtual in 1 year(s)  Signed, Baldwin Jamaica, PA-C  06/30/2019 3:06 PM    [place]

## 2019-06-30 ENCOUNTER — Telehealth (INDEPENDENT_AMBULATORY_CARE_PROVIDER_SITE_OTHER): Payer: PPO | Admitting: Physician Assistant

## 2019-06-30 ENCOUNTER — Other Ambulatory Visit: Payer: Self-pay

## 2019-06-30 VITALS — BP 130/84 | Ht 66.0 in | Wt 195.0 lb

## 2019-06-30 DIAGNOSIS — I10 Essential (primary) hypertension: Secondary | ICD-10-CM

## 2019-06-30 DIAGNOSIS — I5022 Chronic systolic (congestive) heart failure: Secondary | ICD-10-CM | POA: Diagnosis not present

## 2019-06-30 DIAGNOSIS — I4811 Longstanding persistent atrial fibrillation: Secondary | ICD-10-CM | POA: Diagnosis not present

## 2019-06-30 NOTE — Patient Instructions (Signed)

## 2019-07-13 ENCOUNTER — Other Ambulatory Visit (HOSPITAL_COMMUNITY): Payer: Self-pay | Admitting: *Deleted

## 2019-07-13 ENCOUNTER — Other Ambulatory Visit: Payer: Self-pay

## 2019-07-13 MED ORDER — APIXABAN 5 MG PO TABS: 5.0000 mg | ORAL_TABLET | Freq: Two times a day (BID) | ORAL | 1 refills | Status: DC

## 2019-07-14 ENCOUNTER — Other Ambulatory Visit: Payer: Self-pay | Admitting: Family Medicine

## 2019-07-14 ENCOUNTER — Other Ambulatory Visit (INDEPENDENT_AMBULATORY_CARE_PROVIDER_SITE_OTHER): Payer: PPO

## 2019-07-14 DIAGNOSIS — R5383 Other fatigue: Secondary | ICD-10-CM

## 2019-07-14 DIAGNOSIS — R05 Cough: Secondary | ICD-10-CM | POA: Diagnosis not present

## 2019-07-14 DIAGNOSIS — R739 Hyperglycemia, unspecified: Secondary | ICD-10-CM

## 2019-07-14 DIAGNOSIS — I5022 Chronic systolic (congestive) heart failure: Secondary | ICD-10-CM

## 2019-07-14 DIAGNOSIS — R053 Chronic cough: Secondary | ICD-10-CM

## 2019-07-14 LAB — HEMOGLOBIN A1C: Hgb A1c MFr Bld: 6.6 % — ABNORMAL HIGH (ref 4.6–6.5)

## 2019-07-14 LAB — BRAIN NATRIURETIC PEPTIDE: Pro B Natriuretic peptide (BNP): 322 pg/mL — ABNORMAL HIGH (ref 0.0–100.0)

## 2019-07-14 NOTE — Addendum Note (Signed)
Addended by: Philemon Kingdom on: 07/14/2019 09:04 AM   Modules accepted: Orders

## 2019-08-12 ENCOUNTER — Telehealth: Payer: Self-pay | Admitting: Family Medicine

## 2019-08-12 ENCOUNTER — Telehealth: Payer: Self-pay | Admitting: Internal Medicine

## 2019-08-12 NOTE — Telephone Encounter (Signed)
She saw PA with cardiology recently.  If they have concerns about follow up echocardiogram would have them direct that question to cardiology.  She should probably be seen through the cardiology CHF clinic if still having dyspnea and increased leg edema.

## 2019-08-12 NOTE — Telephone Encounter (Signed)
Given pt information and has understanding

## 2019-08-12 NOTE — Telephone Encounter (Signed)
Please advise 

## 2019-08-12 NOTE — Telephone Encounter (Signed)
Patient's caregiver, Junious Dresser, is calling in regards to an appointment she assumed was scheduled for 08/13/19 with Dr. Johney Frame. I made her aware that the patient does/did not have an appointment scheduled for 08/13/19 with Dr. Johney Frame. I offered to schedule the patient and she stated that she will call patient's PCP, Dr. Caryl Never first.

## 2019-08-12 NOTE — Telephone Encounter (Signed)
Pt's caregiver, [name], stated that she checked with Dr. Johney Frame office to make sure the pt had an appt for an echocardiogram and was told that there is no appt for that. [name] stated at pt's last appt [name] wanted her to have an echocardiogram and he would send the info to Dr. Johney Frame. She wanted to follow up to see if that should still happen.   [name] also stated that she believes she has lipedema and would like if [name] can refer her to a Vascular doctor.   [name] is not listed on the DPR and I did inform her that we would have to speak to the pt when calling back.   Pt can be reached at [phone] --it would be best if calling to do so in the morning.

## 2019-08-13 ENCOUNTER — Other Ambulatory Visit: Payer: Self-pay

## 2019-08-13 MED ORDER — ATORVASTATIN CALCIUM 10 MG PO TABS: 5.0000 mg | ORAL_TABLET | Freq: Every day | ORAL | 3 refills | Status: DC

## 2019-08-20 ENCOUNTER — Telehealth: Payer: Self-pay | Admitting: Family Medicine

## 2019-08-20 NOTE — Chronic Care Management (AMB) (Signed)
  Chronic Care Management   Note  08/20/2019 Name: [name] MRN: [id number] DOB: 11-18-1929  [name] is a 86 y.o. year old female who is a primary care patient of [name], Elberta Fortis, MD. I reached out to Nancy Fetter by phone today in response to a referral sent by [name]'s PCP, [name], Elberta Fortis, MD.   [name] was given information about Chronic Care Management services today including:  1. CCM service includes personalized support from designated clinical staff supervised by her physician, including individualized plan of care and coordination with other care providers 2. 24/7 contact phone numbers for assistance for urgent and routine care needs. 3. Service will only be billed when office clinical staff spend 20 minutes or more in a month to coordinate care. 4. Only one practitioner may furnish and bill the service in a calendar month. 5. The patient may stop CCM services at any time (effective at the end of the month) by phone call to the office staff.   Patient agreed to services and verbal consent obtained.   Follow up plan:   Myra Cody  Upstream Scheduler

## 2019-08-27 ENCOUNTER — Telehealth: Payer: Self-pay

## 2019-08-27 ENCOUNTER — Ambulatory Visit (INDEPENDENT_AMBULATORY_CARE_PROVIDER_SITE_OTHER): Payer: PPO | Admitting: Student

## 2019-08-27 ENCOUNTER — Other Ambulatory Visit: Payer: Self-pay

## 2019-08-27 ENCOUNTER — Encounter: Payer: Self-pay | Admitting: Student

## 2019-08-27 VITALS — BP 130/64 | HR 87 | Ht 66.0 in | Wt 203.4 lb

## 2019-08-27 DIAGNOSIS — I872 Venous insufficiency (chronic) (peripheral): Secondary | ICD-10-CM

## 2019-08-27 DIAGNOSIS — Z79899 Other long term (current) drug therapy: Secondary | ICD-10-CM

## 2019-08-27 DIAGNOSIS — I10 Essential (primary) hypertension: Secondary | ICD-10-CM

## 2019-08-27 DIAGNOSIS — I4821 Permanent atrial fibrillation: Secondary | ICD-10-CM

## 2019-08-27 DIAGNOSIS — I5022 Chronic systolic (congestive) heart failure: Secondary | ICD-10-CM

## 2019-08-27 MED ORDER — POTASSIUM CHLORIDE ER 10 MEQ PO TBCR: 10.0000 meq | EXTENDED_RELEASE_TABLET | Freq: Every day | ORAL | 3 refills | Status: DC

## 2019-08-27 MED ORDER — FUROSEMIDE 40 MG PO TABS
40.0000 mg | ORAL_TABLET | Freq: Every day | ORAL | 3 refills | Status: DC
Start: 2019-08-27 — End: 2019-09-08

## 2019-08-27 NOTE — Patient Instructions (Addendum)
Medication Instructions:  START LASIX 40 mg Daily START POTASSIUM 10 meq Daily *If you need a refill on your cardiac medications before your next appointment, please call your pharmacy*   Lab Work:  TODAY BMET If you have labs (blood work) drawn today and your tests are completely normal, you will receive your results only by: Marland Kitchen MyChart Message (if you have MyChart) OR . A paper copy in the mail If you have any lab test that is abnormal or we need to change your treatment, we will call you to review the results.   Testing/Procedures: none   Follow-Up: At [facility], you and your health needs are our priority.  As part of our continuing mission to provide you with exceptional heart care, we have created designated Provider Care Teams.  These Care Teams include your primary Cardiologist (physician) and Advanced Practice Providers (APPs -  Physician Assistants and Nurse Practitioners) who all work together to provide you with the care you need, when you need it.  We recommend signing up for the patient portal called "MyChart".  Sign up information is provided on this After Visit Summary.  MyChart is used to connect with patients for Virtual Visits (Telemedicine).  Patients are able to view lab/test results, encounter notes, upcoming appointments, etc.  Non-urgent messages can be sent to your provider as well.   To learn more about what you can do with MyChart, go to [email or web address].    Your next appointment:   2 WEEKS  The format for your next appointment:   In person  Provider:   Otilio Saber, PA   Other Instructions  Compression Stocking (medium compression)   DASH Eating Plan DASH stands for "Dietary Approaches to Stop Hypertension." The DASH eating plan is a healthy eating plan that has been shown to reduce high blood pressure (hypertension). It may also reduce your risk for type 2 diabetes, heart disease, and stroke. The DASH eating plan may also help with weight  loss. What are tips for following this plan?  General guidelines  Avoid eating more than 2,300 mg (milligrams) of salt (sodium) a day. If you have hypertension, you may need to reduce your sodium intake to 1,500 mg a day.  Limit alcohol intake to no more than 1 drink a day for nonpregnant women and 2 drinks a day for men. One drink equals 12 oz of beer, 5 oz of wine, or 1 oz of hard liquor.  Work with your health care provider to maintain a healthy body weight or to lose weight. Ask what an ideal weight is for you.  Get at least 30 minutes of exercise that causes your heart to beat faster (aerobic exercise) most days of the week. Activities may include walking, swimming, or biking.  Work with your health care provider or diet and nutrition specialist (dietitian) to adjust your eating plan to your individual calorie needs. Reading food labels   Check food labels for the amount of sodium per serving. Choose foods with less than 5 percent of the Daily Value of sodium. Generally, foods with less than 300 mg of sodium per serving fit into this eating plan.  To find whole grains, look for the word "whole" as the first word in the ingredient list. Shopping  Buy products labeled as "low-sodium" or "no salt added."  Buy fresh foods. Avoid canned foods and premade or frozen meals. Cooking  Avoid adding salt when cooking. Use salt-free seasonings or herbs instead of table salt or sea salt.  Check with your health care provider or pharmacist before using salt substitutes.  Do not fry foods. Cook foods using healthy methods such as baking, boiling, grilling, and broiling instead.  Cook with heart-healthy oils, such as olive, canola, soybean, or sunflower oil. Meal planning  Eat a balanced diet that includes: ? 5 or more servings of fruits and vegetables each day. At each meal, try to fill half of your plate with fruits and vegetables. ? Up to 6-8 servings of whole grains each day. ? Less than  6 oz of lean meat, poultry, or fish each day. A 3-oz serving of meat is about the same size as a deck of cards. One egg equals 1 oz. ? 2 servings of low-fat dairy each day. ? A serving of nuts, seeds, or beans 5 times each week. ? Heart-healthy fats. Healthy fats called Omega-3 fatty acids are found in foods such as flaxseeds and coldwater fish, like sardines, salmon, and mackerel.  Limit how much you eat of the following: ? Canned or prepackaged foods. ? Food that is high in trans fat, such as fried foods. ? Food that is high in saturated fat, such as fatty meat. ? Sweets, desserts, sugary drinks, and other foods with added sugar. ? Full-fat dairy products.  Do not salt foods before eating.  Try to eat at least 2 vegetarian meals each week.  Eat more home-cooked food and less restaurant, buffet, and fast food.  When eating at a restaurant, ask that your food be prepared with less salt or no salt, if possible. What foods are recommended? The items listed may not be a complete list. Talk with your dietitian about what dietary choices are best for you. Grains Whole-grain or whole-wheat bread. Whole-grain or whole-wheat pasta. Membreno rice. Modena Morrow. Bulgur. Whole-grain and low-sodium cereals. Pita bread. Low-fat, low-sodium crackers. Whole-wheat flour tortillas. Vegetables Fresh or frozen vegetables (raw, steamed, roasted, or grilled). Low-sodium or reduced-sodium tomato and vegetable juice. Low-sodium or reduced-sodium tomato sauce and tomato paste. Low-sodium or reduced-sodium canned vegetables. Fruits All fresh, dried, or frozen fruit. Canned fruit in natural juice (without added sugar). Meat and other protein foods Skinless chicken or [place]. Ground chicken or [place]. Pork with fat trimmed off. Fish and seafood. Egg whites. Dried beans, peas, or lentils. Unsalted nuts, nut butters, and seeds. Unsalted canned beans. Lean cuts of beef with fat trimmed off. Low-sodium, lean deli  meat. Dairy Low-fat (1%) or fat-free (skim) milk. Fat-free, low-fat, or reduced-fat cheeses. Nonfat, low-sodium ricotta or cottage cheese. Low-fat or nonfat yogurt. Low-fat, low-sodium cheese. Fats and oils Soft margarine without trans fats. Vegetable oil. Low-fat, reduced-fat, or light mayonnaise and salad dressings (reduced-sodium). Canola, safflower, olive, soybean, and sunflower oils. Avocado. Seasoning and other foods Herbs. Spices. Seasoning mixes without salt. Unsalted popcorn and pretzels. Fat-free sweets. What foods are not recommended? The items listed may not be a complete list. Talk with your dietitian about what dietary choices are best for you. Grains Baked goods made with fat, such as croissants, muffins, or some breads. Dry pasta or rice meal packs. Vegetables Creamed or fried vegetables. Vegetables in a cheese sauce. Regular canned vegetables (not low-sodium or reduced-sodium). Regular canned tomato sauce and paste (not low-sodium or reduced-sodium). Regular tomato and vegetable juice (not low-sodium or reduced-sodium). Angie Fava. Olives. Fruits Canned fruit in a light or heavy syrup. Fried fruit. Fruit in cream or butter sauce. Meat and other protein foods Fatty cuts of meat. Ribs. Fried meat. Berniece Salines. Sausage. Bologna and other processed  lunch meats. Salami. Fatback. Hotdogs. Bratwurst. Salted nuts and seeds. Canned beans with added salt. Canned or smoked fish. Whole eggs or egg yolks. Chicken or [place] with skin. Dairy Whole or 2% milk, cream, and half-and-half. Whole or full-fat cream cheese. Whole-fat or sweetened yogurt. Full-fat cheese. Nondairy creamers. Whipped toppings. Processed cheese and cheese spreads. Fats and oils Butter. Stick margarine. Lard. Shortening. Ghee. Bacon fat. Tropical oils, such as coconut, palm kernel, or palm oil. Seasoning and other foods Salted popcorn and pretzels. Onion salt, garlic salt, seasoned salt, table salt, and sea salt. Worcestershire  sauce. Tartar sauce. Barbecue sauce. Teriyaki sauce. Soy sauce, including reduced-sodium. Steak sauce. Canned and packaged gravies. Fish sauce. Oyster sauce. Cocktail sauce. Horseradish that you find on the shelf. Ketchup. Mustard. Meat flavorings and tenderizers. Bouillon cubes. Hot sauce and Tabasco sauce. Premade or packaged marinades. Premade or packaged taco seasonings. Relishes. Regular salad dressings. Where to find more information:  National Heart, Lung, and Blood Institute: PopSteam.is  American Heart Association: www.heart.org Summary  The DASH eating plan is a healthy eating plan that has been shown to reduce high blood pressure (hypertension). It may also reduce your risk for type 2 diabetes, heart disease, and stroke.  With the DASH eating plan, you should limit salt (sodium) intake to 2,300 mg a day. If you have hypertension, you may need to reduce your sodium intake to 1,500 mg a day.  When on the DASH eating plan, aim to eat more fresh fruits and vegetables, whole grains, lean proteins, low-fat dairy, and heart-healthy fats.  Work with your health care provider or diet and nutrition specialist (dietitian) to adjust your eating plan to your individual calorie needs. This information is not intended to replace advice given to you by your health care provider. Make sure you discuss any questions you have with your health care provider. Document Revised: 03/22/2017 Document Reviewed: 04/02/2016 Elsevier Patient Education  2020 [employer].

## 2019-08-27 NOTE — Progress Notes (Signed)
PCP:  Eulas Post, MD Primary Cardiologist: No primary care provider on file. Electrophysiologist: Thompson Grayer, MD   [name] is a 86 y.o. female seen today for Thompson Grayer, MD for acute visit due to peripheral edema.  Since last being seen in our clinic the patient reports doing about the same. However, she does have somewhat worse DOE over the past month. Saw PCP who increased he lasix for a week. She did not feel like this helped, and has not taken lasix in > 1 week now.  She did not feel better on lasix, and feels no worse off. She denies chest pain. She has SOB with mild exertion, which is chronic (years). She sleeps in a recliner x > 10 years. She does not weigh daily. Tries to watch her salt intake, but uses table salt per daughter. She does have large amounts of fluid intake "keeps a cup of water and ice with her all day". Her legs do get sore when the swelling increases.  Past Medical History:  Diagnosis Date  . Anemia   . CAD 09/24/2008   AMI in 1998 tx with PCI to LAD;  myoview 1/12:  no ischemia, EF 45%  . CARDIOMYOPATHY 09/24/2008   ischemic;  echo 4/12:  EF 20-25%, mild MR, mod LAE, mod reduced RVSF, mod RVE, mild RAE, mild TR, PASP 40  . CAROTID BRUIT 09/24/2008  . DIABETES MELLITUS 09/24/2008  . Edema 09/24/2008  . GERD (gastroesophageal reflux disease)   . HYPERLIPIDEMIA 09/24/2008  . HYPERTENSION 09/24/2008  . MI 09/24/2008  . Osteopenia   . Persistent atrial fibrillation ([place]) 09/24/2008   s/p DCCV x 4 in past;  Amiod d/c'd 2/2 abnormal PFTs;  Tikosyn load 6/12 with DCCV  . Tachycardia-bradycardia [facility])    Past Surgical History:  Procedure Laterality Date  . CARDIOVERSION N/A 02/03/2015   Procedure: CARDIOVERSION;  Surgeon: Dorothy Spark, MD;  Location: [facility] ENDOSCOPY;  Service: Cardiovascular;  Laterality: N/A;  . CARDIOVERSION N/A 07/08/2018   Procedure: CARDIOVERSION;  Surgeon: Thayer Headings, MD;  Location[facility] ENDOSCOPY;  Service: Cardiovascular;  Laterality:  N/A;  getting INR prior  . ELECTROPHYSIOLOGIC STUDY N/A 10/15/2014   PVI and CTI ablation by Dr Rayann Heman  . HIP FRACTURE SURGERY  2006  . KNEE SURGERY  2008   broken knee  . LOOP RECORDER IMPLANT N/A 11/06/2012   Procedure: LOOP RECORDER IMPLANT;  Surgeon: Thompson Grayer, MD;  Location: [facility] CATH LAB;  Service: Cardiovascular;  Laterality: N/A;Medtronic LinQ implanted by Dr Rayann Heman for palpitaitons and dizziness  . TEE WITHOUT CARDIOVERSION N/A 10/15/2014   Procedure: TRANSESOPHAGEAL ECHOCARDIOGRAM (TEE);  Surgeon: Thayer Headings, MD;  Location: [facility] ENDOSCOPY;  Service: Cardiovascular;  Laterality: N/A;    Current Outpatient Medications  Medication Sig Dispense Refill  . acetaminophen (TYLENOL) 325 MG tablet Take 325 mg by mouth 2 (two) times daily as needed for moderate pain or headache.    Marland Kitchen apixaban (ELIQUIS) 5 MG TABS tablet Take 1 tablet (5 mg total) by mouth 2 (two) times daily. 180 tablet 1  . aspirin 81 MG EC tablet Take 81 mg by mouth every evening.     Marland Kitchen atorvastatin (LIPITOR) 10 MG tablet Take 0.5 tablets (5 mg total) by mouth daily after supper. 45 tablet 3  . Blood Glucose Monitoring Suppl (ONE TOUCH ULTRA SYSTEM KIT) w/Device KIT 1 kit by Does not apply route once. 1 each 0  . Calcium Carb-Cholecalciferol (CALCIUM 600+D3 PO) Take 1 tablet by  mouth daily.    Marland Kitchen glucose blood test strip 1 each by Other route as needed for other. Use as instructed with One Touch glucometer. 100 each 11  . Magnesium Oxide 400 MG CAPS Take 1 capsule (400 mg total) by mouth daily. 90 capsule 3  . metoprolol tartrate (LOPRESSOR) 100 MG tablet Take 0.5 tablets (50 mg total) by mouth 2 (two) times daily. 90 tablet 3  . Multiple Vitamin (MULTIVITAMIN WITH MINERALS) TABS tablet Take 1 tablet by mouth daily. One-A-Day    . ramipril (ALTACE) 10 MG capsule Take 2 capsules (20 mg total) by mouth daily. 180 capsule 3  . sertraline (ZOLOFT) 50 MG tablet Take 25 mg by mouth daily.    . vitamin B-12 (CYANOCOBALAMIN) 500  MCG tablet Take 500 mcg by mouth daily.    . vitamin E 400 UNIT capsule Take 400 Units by mouth daily.    . furosemide (LASIX) 40 MG tablet Take 1 tablet (40 mg total) by mouth daily. 90 tablet 3  . potassium chloride (KLOR-CON) 10 MEQ tablet Take 1 tablet (10 mEq total) by mouth daily. 90 tablet 3   No current facility-administered medications for this visit.    Allergies  Allergen Reactions  . Simvastatin Rash    Social History   Socioeconomic History  . Marital status: Widowed    Spouse name: Not on file  . Number of children: Not on file  . Years of education: Not on file  . Highest education level: Not on file  Occupational History  . Not on file  Tobacco Use  . Smoking status: Former Smoker    Packs/day: 0.50    Years: 20.00    Pack years: 10.00    Types: Cigarettes    Quit date: 07/11/1996    Years since quitting: 23.1  . Smokeless tobacco: Never Used  Substance and Sexual Activity  . Alcohol use: No  . Drug use: No  . Sexual activity: Not on file  Other Topics Concern  . Not on file  Social History Narrative  . Not on file   Social Determinants of Health   Financial Resource Strain:   . Difficulty of Paying Living Expenses:   Food Insecurity:   . Worried About Charity fundraiser in the Last Year:   . Arboriculturist in the Last Year:   Transportation Needs:   . Film/video editor (Medical):   Marland Kitchen Lack of Transportation (Non-Medical):   Physical Activity:   . Days of Exercise per Week:   . Minutes of Exercise per Session:   Stress:   . Feeling of Stress :   Social Connections:   . Frequency of Communication with Friends and Family:   . Frequency of Social Gatherings with Friends and Family:   . Attends Religious Services:   . Active Member of Clubs or Organizations:   . Attends Archivist Meetings:   Marland Kitchen Marital Status:   Intimate Partner Violence:   . Fear of Current or Ex-Partner:   . Emotionally Abused:   Marland Kitchen Physically Abused:   .  Sexually Abused:     Review of Systems: All other systems reviewed and are otherwise negative except as noted above.  Physical Exam: Vitals:   08/27/19 1241  BP: 130/64  Pulse: 87  SpO2: 97%  Weight: 203 lb 6.4 oz (92.3 kg)  Height: _0  (1.676 m)    GEN- The patient is elderly appearing, alert and oriented x 3 today.  HEENT: normocephalic, atraumatic; sclera clear, conjunctiva pink; hearing intact; oropharynx clear; neck supple, no JVP Lymph- no cervical lymphadenopathy Lungs- Clear to ausculation bilaterally, normal work of breathing.  No wheezes, rales, rhonchi Heart- Irregularly irregular GI- soft, non-tender, non-distended, bowel sounds present, no hepatosplenomegaly Extremities- no clubbing or cyanosis. DP/PT/radial pulses 2+ bilaterally. 1-2+ 3/4 way to knees bilaterally. Left knee with chronic swelling with h/o thigh injury per pt/daughter. MS- no significant deformity or atrophy Skin- warm and dry, no rash or lesion Psych- euthymic mood, full affect Neuro- strength and sensation are intact  EKG is ordered. Personal review of EKG from today shows atrial fibrillation with controlled ventricular rate  Additional studies reviewed include: Previous office notes, Most recent labwork, Previous echoes, Previous EP office notes.   Assessment and Plan:  1. Longstanding persistent AF Continue eliquis for CHA2DS2VASC of at least 7   Pursuing rate control, which is stable at this time.   2. Chronic systolic CHF TEE in 7583 showed LVEF 20-25% NYHA III symptoms, chronically Volume status with mixed picture with peripheral > central suggestive of at least some component of chronic venous insufficiency. JVP ~7-8 cm. Resume lasix 40 mg daily with potassium 10 meq daily.  Continue ramipril 20 mg daily Continue lopressor 50 mg BID Consider spiro next visit.  We discussed repeating an Echo today. Pt has longstanding, known chronic systolic HF. She is aware she meets ICD criteria  and we've previously discussed this and did again today. She would prefer to avoid ICD. At this time, low utility in repeating Echo as it would not change plan.   3. HTN Will not adjust other medications in setting of volume overload  4. Chronic venous insufficiency ? Component of lymphedema Discussed salt and fluid restriction to <2034m and <2007m respectively. Recommended compression hose. They will go to medical supply store in [place] and get some. She has previously had decent results with these in the past.   Labs today. Resume lasix. Compression hose, salt and fluid restriction all encouraged. RTC with me in  2 weeks for further assessment and plan. She is rather content with her current status, so I will work to optimize her peripheral edema. She is aware in the setting of chronic venous insufficiency and possibly lymphedema component she may be near her baseline.   MiShirley FriarPA-C  08/27/19 2:27 PM

## 2019-08-27 NOTE — Telephone Encounter (Signed)
Lpm about appointment today 5/6 @ 1 pm.

## 2019-08-28 LAB — BASIC METABOLIC PANEL
BUN/Creatinine Ratio: 20 (ref 12–28)
BUN: 14 mg/dL (ref 8–27)
CO2: 26 mmol/L (ref 20–29)
Calcium: 9.4 mg/dL (ref 8.7–10.3)
Chloride: 101 mmol/L (ref 96–106)
Creatinine, Ser: 0.69 mg/dL (ref 0.57–1.00)
GFR calc Af Amer: 91 mL/min/{1.73_m2} (ref >59)
GFR calc non Af Amer: 79 mL/min/{1.73_m2} (ref >59)
Glucose: 197 mg/dL — ABNORMAL HIGH (ref 65–99)
Potassium: 4.7 mmol/L (ref 3.5–5.2)
Sodium: 141 mmol/L (ref 134–144)

## 2019-09-08 ENCOUNTER — Ambulatory Visit (INDEPENDENT_AMBULATORY_CARE_PROVIDER_SITE_OTHER): Payer: PPO | Admitting: Student

## 2019-09-08 ENCOUNTER — Encounter: Payer: Self-pay | Admitting: Student

## 2019-09-08 ENCOUNTER — Other Ambulatory Visit: Payer: Self-pay

## 2019-09-08 VITALS — BP 128/80 | HR 84 | Ht 66.0 in | Wt 193.0 lb

## 2019-09-08 DIAGNOSIS — I480 Paroxysmal atrial fibrillation: Secondary | ICD-10-CM | POA: Diagnosis not present

## 2019-09-08 DIAGNOSIS — Z79899 Other long term (current) drug therapy: Secondary | ICD-10-CM

## 2019-09-08 DIAGNOSIS — I5022 Chronic systolic (congestive) heart failure: Secondary | ICD-10-CM

## 2019-09-08 DIAGNOSIS — I119 Hypertensive heart disease without heart failure: Secondary | ICD-10-CM

## 2019-09-08 MED ORDER — FUROSEMIDE 40 MG PO TABS
60.0000 mg | ORAL_TABLET | Freq: Every day | ORAL | 3 refills | Status: DC
Start: 2019-09-08 — End: 2021-12-06

## 2019-09-08 NOTE — Patient Instructions (Addendum)
Medication Instructions:  INCREASE LASIX to 60 mg Daily  (1 1/2 tablets) *If you need a refill on your cardiac medications before your next appointment, please call your pharmacy*   Lab Work:  TODAY BMET If you have labs (blood work) drawn today and your tests are completely normal, you will receive your results only by: Marland Kitchen MyChart Message (if you have MyChart) OR . A paper copy in the mail If you have any lab test that is abnormal or we need to change your treatment, we will call you to review the results.   Testing/Procedures: none   Follow-Up: At [facility], you and your health needs are our priority.  As part of our continuing mission to provide you with exceptional heart care, we have created designated Provider Care Teams.  These Care Teams include your primary Cardiologist (physician) and Advanced Practice Providers (APPs -  Physician Assistants and Nurse Practitioners) who all work together to provide you with the care you need, when you need it.  We recommend signing up for the patient portal called "MyChart".  Sign up information is provided on this After Visit Summary.  MyChart is used to connect with patients for Virtual Visits (Telemedicine).  Patients are able to view lab/test results, encounter notes, upcoming appointments, etc.  Non-urgent messages can be sent to your provider as well.   To learn more about what you can do with MyChart, go to [email or web address].    Your next appointment:   2 MONTHS  The format for your next appointment:   In Person  Provider:   Otilio Saber, PA   Other Instructions

## 2019-09-08 NOTE — Progress Notes (Signed)
PCP:  Eulas Post, MD Primary Cardiologist: No primary care provider on file. Electrophysiologist: Thompson Grayer, MD   [name] is a 86 y.o. female seen today for Thompson Grayer, MD for routine electrophysiology followup.  Since last being seen in our clinic the patient reports doing very well. She is down 10 lbs on lasix regimen and fluid restrictions. She does not think she has had SOB, or at least "hasn't thought about it" which is a positive change. Peripheral edema much improved. Her daughter in law wrapped her legs for several days when pt did not tolerate compression hose.   Past Medical History:  Diagnosis Date  . Anemia   . CAD 09/24/2008   AMI in 1998 tx with PCI to LAD;  myoview 1/12:  no ischemia, EF 45%  . CARDIOMYOPATHY 09/24/2008   ischemic;  echo 4/12:  EF 20-25%, mild MR, mod LAE, mod reduced RVSF, mod RVE, mild RAE, mild TR, PASP 40  . CAROTID BRUIT 09/24/2008  . DIABETES MELLITUS 09/24/2008  . Edema 09/24/2008  . GERD (gastroesophageal reflux disease)   . HYPERLIPIDEMIA 09/24/2008  . HYPERTENSION 09/24/2008  . MI 09/24/2008  . Osteopenia   . Persistent atrial fibrillation ([place]) 09/24/2008   s/p DCCV x 4 in past;  Amiod d/c'd 2/2 abnormal PFTs;  Tikosyn load 6/12 with DCCV  . Tachycardia-bradycardia [facility])    Past Surgical History:  Procedure Laterality Date  . CARDIOVERSION N/A 02/03/2015   Procedure: CARDIOVERSION;  Surgeon: Dorothy Spark, MD;  Location: [facility] ENDOSCOPY;  Service: Cardiovascular;  Laterality: N/A;  . CARDIOVERSION N/A 07/08/2018   Procedure: CARDIOVERSION;  Surgeon: Thayer Headings, MD;  Location: [facility] ENDOSCOPY;  Service: Cardiovascular;  Laterality: N/A;  getting INR prior  . ELECTROPHYSIOLOGIC STUDY N/A 10/15/2014   PVI and CTI ablation by Dr Rayann Heman  . HIP FRACTURE SURGERY  2006  . KNEE SURGERY  2008   broken knee  . LOOP RECORDER IMPLANT N/A 11/06/2012   Procedure: LOOP RECORDER IMPLANT;  Surgeon: Thompson Grayer, MD;  Location: [facility] CATH LAB;  Service:  Cardiovascular;  Laterality: N/A;Medtronic LinQ implanted by Dr Rayann Heman for palpitaitons and dizziness  . TEE WITHOUT CARDIOVERSION N/A 10/15/2014   Procedure: TRANSESOPHAGEAL ECHOCARDIOGRAM (TEE);  Surgeon: Thayer Headings, MD;  Location: [facility] ENDOSCOPY;  Service: Cardiovascular;  Laterality: N/A;    Current Outpatient Medications  Medication Sig Dispense Refill  . acetaminophen (TYLENOL) 325 MG tablet Take 325 mg by mouth 2 (two) times daily as needed for moderate pain or headache.    Marland Kitchen apixaban (ELIQUIS) 5 MG TABS tablet Take 1 tablet (5 mg total) by mouth 2 (two) times daily. 180 tablet 1  . aspirin 81 MG EC tablet Take 81 mg by mouth every evening.     Marland Kitchen atorvastatin (LIPITOR) 10 MG tablet Take 0.5 tablets (5 mg total) by mouth daily after supper. 45 tablet 3  . Blood Glucose Monitoring Suppl (ONE TOUCH ULTRA SYSTEM KIT) w/Device KIT 1 kit by Does not apply route once. 1 each 0  . Calcium Carb-Cholecalciferol (CALCIUM 600+D3 PO) Take 1 tablet by mouth daily.    . furosemide (LASIX) 40 MG tablet Take 1 tablet (40 mg total) by mouth daily. 90 tablet 3  . glucose blood test strip 1 each by Other route as needed for other. Use as instructed with One Touch glucometer. 100 each 11  . Magnesium Oxide 400 MG CAPS Take 1 capsule (400 mg total) by mouth daily. 90 capsule 3  .  metoprolol tartrate (LOPRESSOR) 100 MG tablet Take 0.5 tablets (50 mg total) by mouth 2 (two) times daily. 90 tablet 3  . Multiple Vitamin (MULTIVITAMIN WITH MINERALS) TABS tablet Take 1 tablet by mouth daily. One-A-Day    . potassium chloride (KLOR-CON) 10 MEQ tablet Take 1 tablet (10 mEq total) by mouth daily. 90 tablet 3  . ramipril (ALTACE) 10 MG capsule Take 2 capsules (20 mg total) by mouth daily. 180 capsule 3  . vitamin B-12 (CYANOCOBALAMIN) 500 MCG tablet Take 500 mcg by mouth daily.    . vitamin E 400 UNIT capsule Take 400 Units by mouth daily.     No current facility-administered medications for this visit.     Allergies  Allergen Reactions  . Simvastatin Rash    Social History   Socioeconomic History  . Marital status: Widowed    Spouse name: Not on file  . Number of children: Not on file  . Years of education: Not on file  . Highest education level: Not on file  Occupational History  . Not on file  Tobacco Use  . Smoking status: Former Smoker    Packs/day: 0.50    Years: 20.00    Pack years: 10.00    Types: Cigarettes    Quit date: 07/11/1996    Years since quitting: 23.1  . Smokeless tobacco: Never Used  Substance and Sexual Activity  . Alcohol use: No  . Drug use: No  . Sexual activity: Not on file  Other Topics Concern  . Not on file  Social History Narrative  . Not on file   Social Determinants of Health   Financial Resource Strain:   . Difficulty of Paying Living Expenses:   Food Insecurity:   . Worried About Charity fundraiser in the Last Year:   . Arboriculturist in the Last Year:   Transportation Needs:   . Film/video editor (Medical):   Marland Kitchen Lack of Transportation (Non-Medical):   Physical Activity:   . Days of Exercise per Week:   . Minutes of Exercise per Session:   Stress:   . Feeling of Stress :   Social Connections:   . Frequency of Communication with Friends and Family:   . Frequency of Social Gatherings with Friends and Family:   . Attends Religious Services:   . Active Member of Clubs or Organizations:   . Attends Archivist Meetings:   Marland Kitchen Marital Status:   Intimate Partner Violence:   . Fear of Current or Ex-Partner:   . Emotionally Abused:   Marland Kitchen Physically Abused:   . Sexually Abused:      Review of Systems: General: No chills, fever, night sweats or weight changes  Cardiovascular:  No chest pain, dyspnea on exertion, edema, orthopnea, palpitations, paroxysmal nocturnal dyspnea Dermatological: No rash, lesions or masses Respiratory: No cough, dyspnea Urologic: No hematuria, dysuria Abdominal: No nausea, vomiting,  diarrhea, bright red blood per rectum, melena, or hematemesis Neurologic: No visual changes, weakness, changes in mental status All other systems reviewed and are otherwise negative except as noted above.  Physical Exam: Vitals:   09/08/19 1201  BP: 128/80  Pulse: 84  SpO2: 96%  Weight: 193 lb (87.5 kg)  Height: '5\' 6"'  (1.676 m)   Wt Readings from Last 3 Encounters:  09/08/19 193 lb (87.5 kg)  08/27/19 203 lb 6.4 oz (92.3 kg)  06/30/19 195 lb (88.5 kg)    GEN- The patient is well appearing, alert and oriented x  3 today.   HEENT: normocephalic, atraumatic; sclera clear, conjunctiva pink; hearing intact; oropharynx clear; neck supple, no JVP Lymph- no cervical lymphadenopathy Lungs- Clear to ausculation bilaterally, normal work of breathing.  No wheezes, rales, rhonchi Heart- Regular rate and rhythm, no murmurs, rubs or gallops, PMI not laterally displaced GI- soft, non-tender, non-distended, bowel sounds present, no hepatosplenomegaly Extremities- no clubbing, cyanosis, or edema; DP/PT/radial pulses 2+ bilaterally MS- no significant deformity or atrophy Skin- warm and dry, no rash or lesion Psych- euthymic mood, full affect Neuro- strength and sensation are intact  EKG is not ordered.   Assessment and Plan:  1. Longstanding persistent AF Continue eliquis for CHA2DS2VASC of at least 7  Pursuing rate control, which is stable at this time.   2. Chronic systolic CHF TEE in 4949 showed LVEF 20-25% NYHA III symptoms chronically.  Volume status much improved Continue lasix 60 mg daily for now. BMET today.  Continue potassium 10 meq daily.  Continue ramipril 20 mg daily Continue lopressor 50 mg BID K has been borderline high. Follow. Can consider spiro if stable.  Previously discussed repeating an Echo. Pt has longstanding, known chronic systolic HF. She is aware she meets ICD criteria and we've previously discussed this. She would prefer to avoid ICD. At this time, low utility  in repeating Echo as it would not change plan.   3. HTN Continue current management  4. Chronic venous insufficiency Suspect component of lymphedema Reinforced salt and fluid restriction Continue compression hose.   Doing much better with lasix adjustment, compression hose, and salt/fluid restriction. RTC 2 months for continued assessment, then stretch out once stable.   Shirley Friar, PA-C  09/08/19 12:13 PM

## 2019-09-09 ENCOUNTER — Telehealth: Payer: Self-pay

## 2019-09-09 LAB — BASIC METABOLIC PANEL
BUN/Creatinine Ratio: 36 — ABNORMAL HIGH (ref 12–28)
BUN: 31 mg/dL — ABNORMAL HIGH (ref 8–27)
CO2: 28 mmol/L (ref 20–29)
Calcium: 10 mg/dL (ref 8.7–10.3)
Chloride: 102 mmol/L (ref 96–106)
Creatinine, Ser: 0.86 mg/dL (ref 0.57–1.00)
GFR calc Af Amer: 70 mL/min/{1.73_m2} (ref >59)
GFR calc non Af Amer: 61 mL/min/{1.73_m2} (ref >59)
Glucose: 103 mg/dL — ABNORMAL HIGH (ref 65–99)
Potassium: 5 mmol/L (ref 3.5–5.2)
Sodium: 145 mmol/L — ABNORMAL HIGH (ref 134–144)

## 2019-09-09 NOTE — Telephone Encounter (Signed)
The patient has been notified of the lab result and verbalized understanding.  All questions (if any) were answered. Sigurd Sos, RN 09/09/2019 10:56 AM

## 2019-09-15 ENCOUNTER — Other Ambulatory Visit: Payer: Self-pay

## 2019-09-15 MED ORDER — RAMIPRIL 10 MG PO CAPS: 20.0000 mg | ORAL_CAPSULE | Freq: Every day | ORAL | 3 refills | Status: DC

## 2019-09-28 ENCOUNTER — Telehealth: Payer: Self-pay | Admitting: Family Medicine

## 2019-09-28 NOTE — Progress Notes (Signed)
°  Chronic Care Management   Note  09/28/2019 Name: [name] MRN: [id number] DOB: [birth year]  [name] is a 86 y.o. year old female who is a primary care patient of [name], Elberta Fortis, MD. I reached out to Nancy Fetter by phone today in response to a referral sent by Ms. Ashok Cordia Amaral's PCP, [name], Elberta Fortis, MD.   Ms. Bamford was given information about Chronic Care Management services today including:  1. CCM service includes personalized support from designated clinical staff supervised by her physician, including individualized plan of care and coordination with other care providers 2. 24/7 contact phone numbers for assistance for urgent and routine care needs. 3. Service will only be billed when office clinical staff spend 20 minutes or more in a month to coordinate care. 4. Only one practitioner may furnish and bill the service in a calendar month. 5. The patient may stop CCM services at any time (effective at the end of the month) by phone call to the office staff.   Patient agreed to services and verbal consent obtained.   Follow up plan:  Alvie Heidelberg [employer] Scheduler

## 2019-10-14 ENCOUNTER — Ambulatory Visit: Payer: PPO | Admitting: Family Medicine

## 2019-11-02 DIAGNOSIS — L299 Pruritus, unspecified: Secondary | ICD-10-CM | POA: Diagnosis not present

## 2019-11-02 DIAGNOSIS — L853 Xerosis cutis: Secondary | ICD-10-CM | POA: Diagnosis not present

## 2019-11-02 DIAGNOSIS — L821 Other seborrheic keratosis: Secondary | ICD-10-CM | POA: Diagnosis not present

## 2019-11-04 ENCOUNTER — Encounter: Payer: Self-pay | Admitting: Student

## 2019-11-04 ENCOUNTER — Other Ambulatory Visit: Payer: Self-pay

## 2019-11-04 ENCOUNTER — Ambulatory Visit (INDEPENDENT_AMBULATORY_CARE_PROVIDER_SITE_OTHER): Payer: PPO | Admitting: Student

## 2019-11-04 VITALS — BP 126/80 | HR 84 | Ht 66.0 in | Wt 198.8 lb

## 2019-11-04 DIAGNOSIS — I480 Paroxysmal atrial fibrillation: Secondary | ICD-10-CM

## 2019-11-04 DIAGNOSIS — I872 Venous insufficiency (chronic) (peripheral): Secondary | ICD-10-CM | POA: Diagnosis not present

## 2019-11-04 DIAGNOSIS — I5022 Chronic systolic (congestive) heart failure: Secondary | ICD-10-CM

## 2019-11-04 DIAGNOSIS — I10 Essential (primary) hypertension: Secondary | ICD-10-CM

## 2019-11-04 NOTE — Progress Notes (Signed)
PCP:  Eulas Post, MD Primary Cardiologist: No primary care provider on file. Electrophysiologist: Thompson Grayer, MD   [name] is a 86 y.o. female seen today for Thompson Grayer, MD for routine electrophysiology followup.  Since last being seen in our clinic the patient reports doing very well. Her edema has been well controlled with lasix adjustments that we made. She still has occasional ankle edema, but much better with compression hose. They have started using a skin cream on her arms which has also helped with her dryness. she denies chest pain, palpitations, dyspnea, PND, orthopnea, nausea, vomiting, dizziness, syncope, weight gain, or early satiety.  Past Medical History:  Diagnosis Date   Anemia    CAD 09/24/2008   AMI in 1998 tx with PCI to LAD;  myoview 1/12:  no ischemia, EF 45%   CARDIOMYOPATHY 09/24/2008   ischemic;  echo 4/12:  EF 20-25%, mild MR, mod LAE, mod reduced RVSF, mod RVE, mild RAE, mild TR, PASP 40   CAROTID BRUIT 09/24/2008   DIABETES MELLITUS 09/24/2008   Edema 09/24/2008   GERD (gastroesophageal reflux disease)    HYPERLIPIDEMIA 09/24/2008   HYPERTENSION 09/24/2008   MI 09/24/2008   Osteopenia    Persistent atrial fibrillation ([place]) 09/24/2008   s/p DCCV x 4 in past;  Amiod d/c'd 2/2 abnormal PFTs;  Tikosyn load 6/12 with DCCV   Tachycardia-bradycardia [facility])    Past Surgical History:  Procedure Laterality Date   CARDIOVERSION N/A 02/03/2015   Procedure: CARDIOVERSION;  Surgeon: Dorothy Spark, MD;  Location: [place];  Service: Cardiovascular;  Laterality: N/A;   CARDIOVERSION N/A 07/08/2018   Procedure: CARDIOVERSION;  Surgeon: Thayer Headings, MD;  Location: [place];  Service: Cardiovascular;  Laterality: N/A;  getting INR prior   ELECTROPHYSIOLOGIC STUDY N/A 10/15/2014   PVI and CTI ablation by Dr Rayann Heman   HIP FRACTURE SURGERY  2006   KNEE SURGERY  2008   broken knee   LOOP RECORDER IMPLANT N/A 11/06/2012   Procedure: LOOP  RECORDER IMPLANT;  Surgeon: Thompson Grayer, MD;  Location: [facility] CATH LAB;  Service: Cardiovascular;  Laterality: N/A;Medtronic LinQ implanted by Dr Rayann Heman for palpitaitons and dizziness   TEE WITHOUT CARDIOVERSION N/A 10/15/2014   Procedure: TRANSESOPHAGEAL ECHOCARDIOGRAM (TEE);  Surgeon: Thayer Headings, MD;  Location[facility] ENDOSCOPY;  Service: Cardiovascular;  Laterality: N/A;    Current Outpatient Medications  Medication Sig Dispense Refill   acetaminophen (TYLENOL) 325 MG tablet Take 325 mg by mouth 2 (two) times daily as needed for moderate pain or headache.     apixaban (ELIQUIS) 5 MG TABS tablet Take 1 tablet (5 mg total) by mouth 2 (two) times daily. 180 tablet 1   aspirin 81 MG EC tablet Take 81 mg by mouth every evening.      atorvastatin (LIPITOR) 10 MG tablet Take 0.5 tablets (5 mg total) by mouth daily after supper. 45 tablet 3   Blood Glucose Monitoring Suppl (ONE TOUCH ULTRA SYSTEM KIT) w/Device KIT 1 kit by Does not apply route once. 1 each 0   Calcium Carb-Cholecalciferol (CALCIUM 600+D3 PO) Take 1 tablet by mouth daily.     furosemide (LASIX) 40 MG tablet Take 1.5 tablets (60 mg total) by mouth daily. 180 tablet 3   glucose blood test strip 1 each by Other route as needed for other. Use as instructed with One Touch glucometer. 100 each 11   Magnesium Oxide 400 MG CAPS Take 1 capsule (400 mg total) by mouth daily.  90 capsule 3   metoprolol tartrate (LOPRESSOR) 100 MG tablet Take 0.5 tablets (50 mg total) by mouth 2 (two) times daily. 90 tablet 3   Multiple Vitamin (MULTIVITAMIN WITH MINERALS) TABS tablet Take 1 tablet by mouth daily. One-A-Day     ramipril (ALTACE) 10 MG capsule Take 2 capsules (20 mg total) by mouth daily. 180 capsule 3   vitamin B-12 (CYANOCOBALAMIN) 500 MCG tablet Take 500 mcg by mouth daily.     vitamin E 400 UNIT capsule Take 400 Units by mouth daily.     No current facility-administered medications for this visit.    Allergies  Allergen  Reactions   Simvastatin Rash    Social History   Socioeconomic History   Marital status: Widowed    Spouse name: Not on file   Number of children: Not on file   Years of education: Not on file   Highest education level: Not on file  Occupational History   Not on file  Tobacco Use   Smoking status: Former Smoker    Packs/day: 0.50    Years: 20.00    Pack years: 10.00    Types: Cigarettes    Quit date: 07/11/1996    Years since quitting: 23.3   Smokeless tobacco: Never Used  Substance and Sexual Activity   Alcohol use: No   Drug use: No   Sexual activity: Not on file  Other Topics Concern   Not on file  Social History Narrative   Not on file   Social Determinants of Health   Financial Resource Strain:    Difficulty of Paying Living Expenses:   Food Insecurity:    Worried About Charity fundraiser in the Last Year:    Arboriculturist in the Last Year:   Transportation Needs:    Film/video editor (Medical):    Lack of Transportation (Non-Medical):   Physical Activity:    Days of Exercise per Week:    Minutes of Exercise per Session:   Stress:    Feeling of Stress :   Social Connections:    Frequency of Communication with Friends and Family:    Frequency of Social Gatherings with Friends and Family:    Attends Religious Services:    Active Member of Clubs or Organizations:    Attends Music therapist:    Marital Status:   Intimate Partner Violence:    Fear of Current or Ex-Partner:    Emotionally Abused:    Physically Abused:    Sexually Abused:      Review of Systems: All other systems reviewed and are otherwise negative except as noted above.  Physical Exam: Vitals:   11/04/19 1155  BP: 126/80  Pulse: 84  SpO2: 93%  Weight: 198 lb 12.8 oz (90.2 kg)  Height: '5\' 6"'  (1.676 m)    GEN- The patient is well appearing, alert and oriented x 3 today.   HEENT: normocephalic, atraumatic; sclera clear,  conjunctiva pink; hearing intact; oropharynx clear; neck supple, no JVP Lymph- no cervical lymphadenopathy Lungs- Clear to ausculation bilaterally, normal work of breathing.  No wheezes, rales, rhonchi Heart- Regular rate and rhythm, no murmurs, rubs or gallops, PMI not laterally displaced GI- soft, non-tender, non-distended, bowel sounds present, no hepatosplenomegaly Extremities- no clubbing, cyanosis, or edema; DP/PT/radial pulses 2+ bilaterally MS- no significant deformity or atrophy Skin- warm and dry, no rash or lesion Psych- euthymic mood, full affect Neuro- strength and sensation are intact  EKG is not ordered.  Additional studies reviewed include: Previous EP office notes.  Assessment and Plan:  1.Longstanding persistent AF Continue eliquis for CHA2DS2VASC of at least 7  Continue rate control. Stable at this time on lopressor 50 mg BID.   2.Chronic systolic CHF TEE in 9037 showed LVEF 20-25% NYHA III symptoms (chronic) Volume status stable.  Continue lasix 60 mg daily for now.  Creatinine has been stable. Pt would prefer to hold off on labs this visit.  Off potassium with borderline high K.  Continue ramipril 20 mg daily Continue lopressor 50 mg BID Would not challenge with spiro given labile K.  Previously discussed repeating an Echo. Pt has longstanding, known chronic systolic HF. She is aware she meets ICD criteria and we've previously discussed this. She would prefer to avoid ICD. At this time, low utility in repeating Echo as it would not change plan.  3. HTN Continue current management.   4. Chronic venous insufficiency Suspect component of lymphedema Reinforced compression hose and salt/fluid restriction.   Pt doing well on current regimen. No change. RTC with me 4 months. Sooner with symptoms.   Shirley Friar, PA-C  11/04/19 12:17 PM

## 2019-11-04 NOTE — Patient Instructions (Addendum)
Medication Instructions:  *If you need a refill on your cardiac medications before your next appointment, please call your pharmacy*  Lab Work: If you have labs (blood work) drawn today and your tests are completely normal, you will receive your results only by:  MyChart Message (if you have MyChart) OR  A paper copy in the mail If you have any lab test that is abnormal or we need to change your treatment, we will call you to review the results.  Follow-Up: At [facility], you and your health needs are our priority.  As part of our continuing mission to provide you with exceptional heart care, we have created designated Provider Care Teams.  These Care Teams include your primary Cardiologist (physician) and Advanced Practice Providers (APPs -  Physician Assistants and Nurse Practitioners) who all work together to provide you with the care you need, when you need it.  We recommend signing up for the patient portal called "MyChart".  Sign up information is provided on this After Visit Summary.  MyChart is used to connect with patients for Virtual Visits (Telemedicine).  Patients are able to view lab/test results, encounter notes, upcoming appointments, etc.  Non-urgent messages can be sent to your provider as well.   To learn more about what you can do with MyChart, go to [email or web address].    Your next appointment:   Your physician recommends that you schedule a follow-up appointment on 02/29/20 at 10:05 am with Otilio Saber, PA-C  The format for your next appointment:   In Person with Casimiro Needle "Mardelle Matte" Lanna Poche, PA-C

## 2019-11-23 ENCOUNTER — Telehealth (HOSPITAL_COMMUNITY): Payer: Self-pay | Admitting: *Deleted

## 2019-11-23 MED ORDER — APIXABAN 5 MG PO TABS: 5.0000 mg | ORAL_TABLET | Freq: Two times a day (BID) | ORAL | 1 refills | Status: DC

## 2019-11-23 NOTE — Telephone Encounter (Signed)
Refill sent in

## 2019-11-23 NOTE — Telephone Encounter (Signed)
Patient needs refill of Eliquis 5mg  twice a day sent to order pharmacy. Patient is following with [place].

## 2019-11-24 ENCOUNTER — Other Ambulatory Visit: Payer: Self-pay

## 2019-11-24 DIAGNOSIS — I119 Hypertensive heart disease without heart failure: Secondary | ICD-10-CM

## 2019-11-24 DIAGNOSIS — I4821 Permanent atrial fibrillation: Secondary | ICD-10-CM

## 2019-11-24 NOTE — Chronic Care Management (AMB) (Signed)
Chronic Care Management Pharmacy  Name: [name]  MRN: [id number] DOB: Sep 22, 1929  Chief Complaint/ HPI  [name],  86 y.o. , female presents for their Initial CCM visit with the clinical pharmacist via telephone.  PCP : Eulas Post, MD Patient Care Team: Eulas Post, MD as PCP - General (Family Medicine) Thompson Grayer, MD as PCP - Electrophysiology (Cardiology) Germaine Pomfret, [facility] as Pharmacist (Pharmacist)  Their chronic conditions include: Hypertension, Hyperlipidemia, Diabetes, Atrial Fibrillation, Heart Failure, Coronary Artery Disease, Osteopenia and Gout   Patient is in good spirits today and feels her health overall has been good. She does not drive and relies on transportation from her grandson (29) or her grandaughter to take her to appointments, grocery shopping. She mainly stays alone at her home and spends her time reading, doing crossword puzzles or keeping up with household chores. She does have a cleaner that comes about once weekly.   Exercise: Mainly sedentary.  Diet: Does not follow any strict diet, states she "eats what she wants." She does not cook very often, but will sometimes make green beans or potatoes. She states she likes to make herself tomato sandwiches regularly. She will eat out (often [employer]) about once a week with her grandson.   Office Visits: 06/24/19: Patient presented to Dr. Elease Hashimoto for bilateral leg edema. Patient referred to cardiology.   Consult Visit: 11/04/19: Patient presented to Barrington Ellison, PA-C (Cardiology) for follow-up. Fluid status stable, no medication changes made.  09/08/19:  Patient presented to Barrington Ellison, PA-C (Cardiology) for follow-up. Sertraline stopped. Fluid status much improved with increase of furosemide to 60 mg daily. Potassium stopped. 08/27/19:  Patient presented to Barrington Ellison, PA-C (Cardiology) for follow-up. Furosemide resumed at 40 mg and potassium 10 mEq daily.  06/30/19: Patient  presented to Tommye Standard, PA-C (Cardiology) for follow-up. Colchine, hydrocodone-APAP stopped.    Allergies  Allergen Reactions   Simvastatin Rash    Medications: Outpatient Encounter Medications as of 11/26/2019  Medication Sig   apixaban (ELIQUIS) 5 MG TABS tablet Take 1 tablet (5 mg total) by mouth 2 (two) times daily.   aspirin 81 MG EC tablet Take 81 mg by mouth every evening.    atorvastatin (LIPITOR) 10 MG tablet Take 0.5 tablets (5 mg total) by mouth daily after supper.   Calcium Carb-Cholecalciferol (CALCIUM 600+D3 PO) Take 1 tablet by mouth daily.   furosemide (LASIX) 40 MG tablet Take 1.5 tablets (60 mg total) by mouth daily. (Patient taking differently: Take 40 mg by mouth daily. )   metoprolol tartrate (LOPRESSOR) 100 MG tablet Take 0.5 tablets (50 mg total) by mouth 2 (two) times daily.   Multiple Vitamin (MULTIVITAMIN WITH MINERALS) TABS tablet Take 1 tablet by mouth daily. One-A-Day   ramipril (ALTACE) 10 MG capsule Take 2 capsules (20 mg total) by mouth daily.   vitamin B-12 (CYANOCOBALAMIN) 500 MCG tablet Take 500 mcg by mouth daily.   vitamin E 400 UNIT capsule Take 400 Units by mouth daily.   acetaminophen (TYLENOL) 325 MG tablet Take 325 mg by mouth 2 (two) times daily as needed for moderate pain or headache.   Blood Glucose Monitoring Suppl (ONE TOUCH ULTRA SYSTEM KIT) w/Device KIT 1 kit by Does not apply route once.   glucose blood test strip 1 each by Other route as needed for other. Use as instructed with One Touch glucometer.   Magnesium Oxide 400 MG CAPS Take 1 capsule (400 mg total) by mouth daily. (Patient taking  differently: Take 200 mg by mouth daily. )   No facility-administered encounter medications on file as of 11/26/2019.     Current Diagnosis/Assessment:  SDOH Interventions     Most Recent Value  SDOH Interventions  Financial Strain Interventions Intervention Not Indicated  Transportation Interventions Intervention Not Indicated       Goals Addressed            This Visit's Progress    Patient Stated       CARE PLAN ENTRY (see longitudinal plan of care for additional care plan information)  Current Barriers:   Chronic Disease Management support, education, and care coordination needs related to Hypertension, Hyperlipidemia, Diabetes, Atrial Fibrillation, Heart Failure, Coronary Artery Disease, Osteopenia and Gout    Hypertension BP Readings from Last 3 Encounters:  11/04/19 126/80  09/08/19 128/80  08/27/19 130/64    Pharmacist Clinical Goal(s): o Over the next 90 days, patient will work with PharmD and providers to maintain BP goal <130/80  Current regimen:   Furosemide 40 mg daily   Metoprolol tartrate 100 mg 1/2 tablet twice daily   Ramipril 10 mg 1 capsule twice daily   Interventions: o Discussed low salt diet and exercising as tolerated extensively o Weighing daily; if you gain more than 3 pounds in one day or 5 pounds in one week call your doctor.  Patient self care activities - Over the next 90 days, patient will: o Check blood pressure weekly, document, and provide at future appointments o Ensure daily salt intake < 2300 mg/day  Hyperlipidemia Lab Results  Component Value Date/Time   LDLCALC 72 10/09/2018 12:00 AM    Pharmacist Clinical Goal(s): o Over the next 90 days, patient will work with PharmD and providers to achieve LDL goal < 70  Current regimen:  o Atorvastatin 10 mg 1/2 tablet daily   Interventions: o Discussed low cholesterol diet and exercising as tolerated extensively  Diabetes Lab Results  Component Value Date/Time   HGBA1C 6.6 (H) 07/14/2019 08:54 AM   HGBA1C 5.8 02/22/2014 10:54 AM    Pharmacist Clinical Goal(s): o Over the next 90 days, patient will work with PharmD and providers to maintain A1c goal <7%  Current regimen:  o None  Interventions: o Discussed carbohydrate counting and exercising as tolerated extensively  Patient self care  activities - Over the next 90 days, patient will: o Check blood sugar monthly , document, and provide at future appointments o Contact provider with any episodes of hypoglycemia  Medication management  Pharmacist Clinical Goal(s): o Over the next 90 days, patient will work with PharmD and providers to maintain optimal medication adherence  Current pharmacy: [facility]   Interventions o Comprehensive medication review performed. o Continue current medication management strategy  Patient self care activities - Over the next 90 days, patient will: o Take medications as prescribed o Report any questions or concerns to PharmD and/or provider(s)       AFIB   Patient is currently rate controlled.  Patient has failed these meds in past: Dofetilide (2012-2020 d/t recurrence and cost), Amiodarone (2011-2012 d/t PFTs) Patient is currently controlled on the following medications:   Apixaban 5 mg BID   Metoprolol Tartrate 100 mg 1/2 tab BID   We discussed:  Denies unusual bruising/bleeding  Plan  Continue current medications  Heart Failure   Type: Systolic  Last ejection fraction: 20-25% (10/15/14) NYHA Class: III (marked limitation of activity) AHA HF Stage: C (Heart disease and symptoms present)  Patient has  failed these meds in past: n/a Patient is currently controlled on the following medications:   Furosemide 40 mg 1.5 tablets daily (taking one tablet daily)   Metoprolol tartrate 100 mg 1/2 tablet twice daily   Ramipril 10 mg 1 capsule twice daily   We discussed: weighing daily; if you gain more than 3 pounds in one day or 5 pounds in one week call your doctor. Patient has only been taking furosemide 40 mg daily. She forgot that it was increased at a previous cardiology appointment. States her fluid status has been stable. She does not weigh herself regularly.   Patient not likely candidate for spironolactone due to labile potassium. Potential candidate for  Entresto.   Plan  Continue current medications  Diabetes   A1c goal <7%  Recent Relevant Labs: Lab Results  Component Value Date/Time   HGBA1C 6.6 (H) 07/14/2019 08:54 AM   HGBA1C 5.8 02/22/2014 10:54 AM   GFR 66.77 06/24/2019 03:55 PM   GFR 73.31 11/15/2017 01:32 PM    Last diabetic Eye exam: No results found for: HMDIABEYEEXA  Last diabetic Foot exam: No results found for: HMDIABFOOTEX   Checking BG: Rarely. Feels like her glucometer is "off" and is not sure if it is working properly.   Recent FBG Readings: n/a Recent pre-meal BG readings: n/a Recent 2hr PP BG readings:  n/a Recent HS BG readings: n/a  Patient has failed these meds in past: n/a Patient is currently controlled on the following medications:  None  We discussed: diet and exercise extensively  Plan  Continue control with diet and exercise  Will plan for home visit to assess if patient's glucometer is working and accurate.   Hypertension   BP goal is:  <130/80  Office blood pressures are  BP Readings from Last 3 Encounters:  11/04/19 126/80  09/08/19 128/80  08/27/19 130/64   CMP Latest Ref Rng & Units 09/08/2019 08/27/2019 06/24/2019  Glucose 65 - 99 mg/dL 103(H) 197(H) 185(H)  BUN 8 - 27 mg/dL 31(H) 14 22  Creatinine 0.57 - 1.00 mg/dL 0.86 0.69 0.81  Sodium 134 - 144 mmol/L 145(H) 141 139  Potassium 3.5 - 5.2 mmol/L 5.0 4.7 4.4  Chloride 96 - 106 mmol/L 102 101 98  CO2 20 - 29 mmol/L _0 Calcium 8.7 - 10.3 mg/dL 10.0 9.4 9.7  Total Protein 6.0 - 8.5 g/dL - - -  Total Bilirubin 0.0 - 1.2 mg/dL - - -  Alkaline Phos 39 - 117 IU/L - - -  AST 0 - 40 IU/L - - -  ALT 0 - 32 IU/L - - -   Patient checks BP at home infrequently Patient home BP readings are ranging: n/a  Patient has failed these meds in the past: n/a Patient is currently controlled on the following medications:   Furosemide 40 mg 1.5 tablets daily (take 40 mg daily)  Metoprolol tartrate 100 mg 1/2 tablet twice daily    Ramipril 10 mg 2 caps daily   We discussed diet and exercise extensively  Plan  Continue current medications   Hyperlipidemia / CAD    LDL goal < 70 MI in 1998 s/p PCI to LAD   Lipid Panel     Component Value Date/Time   CHOL 144 10/09/2018 0000   TRIG 159 (H) 10/09/2018 0000   HDL 40 10/09/2018 0000   LDLCALC 72 10/09/2018 0000    Hepatic Function Latest Ref Rng & Units 10/09/2018 03/20/2017 02/22/2014  Total Protein 6.0 -  8.5 g/dL 7.0 6.9 7.0  Albumin 3.6 - 4.6 g/dL 4.3 3.9 3.3(L)  AST 0 - 40 IU/L _0 ALT 0 - 32 IU/L _1 Alk Phosphatase 39 - 117 IU/L 80 95 74  Total Bilirubin 0.0 - 1.2 mg/dL 0.9 0.7 0.8  Bilirubin, Direct 0.00 - 0.40 mg/dL - 0.20 0.1     The ASCVD Risk score (Emmet., et al., 2013) failed to calculate for the following reasons:   The 2013 ASCVD risk score is only valid for ages 2 to 59   The patient has a prior MI or stroke diagnosis   Patient has failed these meds in past: Simvastatin  Patient is currently uncontrolled on the following medications:   Aspirin 81 mg daily   Atorvastatin 10 mg 1/2 tablet daily   We discussed: Diet and exercise extensively   Plan  Recommend stopping aspirin given distal history of MI and stable ischemia to minimize risk of bleeding.   Misc / OTC     APAP 325 mg 2 tabs daily PRN  Calcium 600 mg + D3 daily   Magnesium Oxide 400 mg daily (taking one 200 mg)    Multivitamin Daily (One-A-Day)  Vitamin B12 500 mcg daily   Vitamin E 400 units daily   Magnesium  Date Value Ref Range Status  06/24/2019 1.9 1.5 - 2.5 mg/dL Final   Vaccines   Reviewed and discussed patient's vaccination history.    Immunization History  Administered Date(s) Administered   Fluad Quad(high Dose 65+) 01/05/2019   Influenza Split 02/20/2012   Influenza, High Dose Seasonal PF 03/03/2015, 02/21/2018   Influenza,inj,Quad PF,6+ Mos 03/13/2013, 02/08/2014, 02/13/2016, 02/06/2017   Pneumococcal  Conjugate-13 04/23/1998, 02/22/2014    Plan  Recommended patient receive Covid vaccine.   Medication Management   [facility] medications Uses pill box? Yes, fills once weekly herself.   Plan  Continue current medication management strategy  Follow up: 1 week home visit to assess glucometer  3 month phone visit  [facility] [place]

## 2019-11-25 ENCOUNTER — Telehealth: Payer: Self-pay

## 2019-11-25 NOTE — Progress Notes (Signed)
Left Voice message to do initial question prior to patient appointment on 11/26/2019 for CCM at 9:00a.m  with Angelena Sole the Clinical pharmacist.    Everlean Cherry Clinical Pharmacist Assistant [phone]

## 2019-11-26 ENCOUNTER — Ambulatory Visit: Payer: PPO

## 2019-11-26 DIAGNOSIS — E119 Type 2 diabetes mellitus without complications: Secondary | ICD-10-CM

## 2019-11-26 DIAGNOSIS — E7800 Pure hypercholesterolemia, unspecified: Secondary | ICD-10-CM

## 2020-01-07 ENCOUNTER — Other Ambulatory Visit: Payer: Self-pay

## 2020-01-08 ENCOUNTER — Encounter: Payer: Self-pay | Admitting: Family Medicine

## 2020-01-08 ENCOUNTER — Ambulatory Visit (INDEPENDENT_AMBULATORY_CARE_PROVIDER_SITE_OTHER): Payer: PPO | Admitting: Family Medicine

## 2020-01-08 VITALS — BP 118/62 | HR 90 | Temp 98.1°F | Ht 66.0 in | Wt 196.0 lb

## 2020-01-08 DIAGNOSIS — R609 Edema, unspecified: Secondary | ICD-10-CM | POA: Diagnosis not present

## 2020-01-08 DIAGNOSIS — I480 Paroxysmal atrial fibrillation: Secondary | ICD-10-CM | POA: Diagnosis not present

## 2020-01-08 DIAGNOSIS — E119 Type 2 diabetes mellitus without complications: Secondary | ICD-10-CM

## 2020-01-08 DIAGNOSIS — R7309 Other abnormal glucose: Secondary | ICD-10-CM | POA: Diagnosis not present

## 2020-01-08 LAB — POCT GLYCOSYLATED HEMOGLOBIN (HGB A1C): Hemoglobin A1C: 6.3 % — AB (ref 4.0–5.6)

## 2020-01-08 LAB — GLUCOSE, POCT (MANUAL RESULT ENTRY): POC Glucose: 124 mg/dl — AB (ref 70–99)

## 2020-01-08 NOTE — Progress Notes (Signed)
Established Patient Office Visit  Subjective:  Patient ID: [name], female    DOB: 05-20-1929  Age: 86 y.o. MRN: [id number]  CC:  Chief Complaint  Patient presents with  . Diabetes    HPI [name] presents for concerns for possible elevated blood sugars.  She has a home monitor and by her monitor she has had fasting sugars ranging between 127 and 167 recently.  Her last A1c was 6.6% back in May.  No significant polyuria but has more polydipsia.  Gets up twice at night to urinate generally and this is her baseline.  No significant weight changes.  She is currently not taking any diabetic medications.  Until a few days ago she apparently was consuming several glasses of sweet tea per day and also several other food items that are high glycemic index.  Her other medical problems include past history of A. fib, hypertension, CAD, obstructive sleep apnea  She has had some past problems with peripheral edema but currently stable on Lasix 40 mg daily.  No orthopnea.  No dyspnea. She remains on Eliquis 5 mg twice daily  Past Medical History:  Diagnosis Date  . Anemia   . CAD 09/24/2008   AMI in 1998 tx with PCI to LAD;  myoview 1/12:  no ischemia, EF 45%  . CARDIOMYOPATHY 09/24/2008   ischemic;  echo 4/12:  EF 20-25%, mild MR, mod LAE, mod reduced RVSF, mod RVE, mild RAE, mild TR, PASP 40  . CAROTID BRUIT 09/24/2008  . DIABETES MELLITUS 09/24/2008  . Edema 09/24/2008  . GERD (gastroesophageal reflux disease)   . HYPERLIPIDEMIA 09/24/2008  . HYPERTENSION 09/24/2008  . MI 09/24/2008  . Osteopenia   . Persistent atrial fibrillation ([place]) 09/24/2008   s/p DCCV x 4 in past;  Amiod d/c'd 2/2 abnormal PFTs;  Tikosyn load 6/12 with DCCV  . Tachycardia-bradycardia [facility])     Past Surgical History:  Procedure Laterality Date  . CARDIOVERSION N/A 02/03/2015   Procedure: CARDIOVERSION;  Surgeon: Dorothy Spark, MD;  Location: [facility] ENDOSCOPY;  Service: Cardiovascular;  Laterality: N/A;  .  CARDIOVERSION N/A 07/08/2018   Procedure: CARDIOVERSION;  Surgeon: Thayer Headings, MD;  Location: [facility] ENDOSCOPY;  Service: Cardiovascular;  Laterality: N/A;  getting INR prior  . ELECTROPHYSIOLOGIC STUDY N/A 10/15/2014   PVI and CTI ablation by Dr Rayann Heman  . HIP FRACTURE SURGERY  2006  . KNEE SURGERY  2008   broken knee  . LOOP RECORDER IMPLANT N/A 11/06/2012   Procedure: LOOP RECORDER IMPLANT;  Surgeon: Thompson Grayer, MD;  Location: [facility] CATH LAB;  Service: Cardiovascular;  Laterality: N/A;Medtronic LinQ implanted by Dr Rayann Heman for palpitaitons and dizziness  . TEE WITHOUT CARDIOVERSION N/A 10/15/2014   Procedure: TRANSESOPHAGEAL ECHOCARDIOGRAM (TEE);  Surgeon: Thayer Headings, MD;  Location: [facility] ENDOSCOPY;  Service: Cardiovascular;  Laterality: N/A;    Family History  Problem Relation Age of Onset  . Breast cancer Mother   . Heart disease Mother   . Diabetes Mother   . Alcohol abuse Father   . Breast cancer Sister   . Brain cancer Sister        cause of death at 30 years old  . Alcohol abuse Brother   . Heart disease Brother   . Heart disease Brother   . Cancer Brother        unknown type  . Coronary artery disease Other   . Hypertension Other   . Heart attack Brother   . Heart attack  Brother     Social History   Socioeconomic History  . Marital status: Widowed    Spouse name: Not on file  . Number of children: Not on file  . Years of education: Not on file  . Highest education level: Not on file  Occupational History  . Not on file  Tobacco Use  . Smoking status: Former Smoker    Packs/day: 0.50    Years: 20.00    Pack years: 10.00    Types: Cigarettes    Quit date: 07/11/1996    Years since quitting: 23.5  . Smokeless tobacco: Never Used  Substance and Sexual Activity  . Alcohol use: No  . Drug use: No  . Sexual activity: Not on file  Other Topics Concern  . Not on file  Social History Narrative  . Not on file   Social Determinants of Health   Financial  Resource Strain: Low Risk   . Difficulty of Paying Living Expenses: Not hard at all  Food Insecurity:   . Worried About Charity fundraiser in the Last Year: Not on file  . Ran Out of Food in the Last Year: Not on file  Transportation Needs: No Transportation Needs  . Lack of Transportation (Medical): No  . Lack of Transportation (Non-Medical): No  Physical Activity:   . Days of Exercise per Week: Not on file  . Minutes of Exercise per Session: Not on file  Stress:   . Feeling of Stress : Not on file  Social Connections:   . Frequency of Communication with Friends and Family: Not on file  . Frequency of Social Gatherings with Friends and Family: Not on file  . Attends Religious Services: Not on file  . Active Member of Clubs or Organizations: Not on file  . Attends Archivist Meetings: Not on file  . Marital Status: Not on file  Intimate Partner Violence:   . Fear of Current or Ex-Partner: Not on file  . Emotionally Abused: Not on file  . Physically Abused: Not on file  . Sexually Abused: Not on file    Outpatient Medications Prior to Visit  Medication Sig Dispense Refill  . acetaminophen (TYLENOL) 325 MG tablet Take 325 mg by mouth 2 (two) times daily as needed for moderate pain or headache.    Marland Kitchen apixaban (ELIQUIS) 5 MG TABS tablet Take 1 tablet (5 mg total) by mouth 2 (two) times daily. 180 tablet 1  . aspirin 81 MG EC tablet Take 81 mg by mouth every evening.     Marland Kitchen atorvastatin (LIPITOR) 10 MG tablet Take 0.5 tablets (5 mg total) by mouth daily after supper. 45 tablet 3  . Blood Glucose Monitoring Suppl (ONE TOUCH ULTRA SYSTEM KIT) w/Device KIT 1 kit by Does not apply route once. 1 each 0  . Calcium Carb-Cholecalciferol (CALCIUM 600+D3 PO) Take 1 tablet by mouth daily.    . furosemide (LASIX) 40 MG tablet Take 1.5 tablets (60 mg total) by mouth daily. (Patient taking differently: Take 40 mg by mouth daily. ) 180 tablet 3  . glucose blood test strip 1 each by Other  route as needed for other. Use as instructed with One Touch glucometer. 100 each 11  . Magnesium Oxide 400 MG CAPS Take 1 capsule (400 mg total) by mouth daily. (Patient taking differently: Take 200 mg by mouth daily. ) 90 capsule 3  . metoprolol tartrate (LOPRESSOR) 100 MG tablet Take 0.5 tablets (50 mg total) by mouth 2 (two)  times daily. 90 tablet 3  . Multiple Vitamin (MULTIVITAMIN WITH MINERALS) TABS tablet Take 1 tablet by mouth daily. One-A-Day    . ramipril (ALTACE) 10 MG capsule Take 2 capsules (20 mg total) by mouth daily. 180 capsule 3  . vitamin B-12 (CYANOCOBALAMIN) 500 MCG tablet Take 500 mcg by mouth daily.    . vitamin E 400 UNIT capsule Take 400 Units by mouth daily.     No facility-administered medications prior to visit.    Allergies  Allergen Reactions  . Simvastatin Rash    ROS Review of Systems  Constitutional: Negative for fatigue.  Eyes: Negative for visual disturbance.  Respiratory: Negative for cough, chest tightness, shortness of breath and wheezing.   Cardiovascular: Negative for chest pain, palpitations and leg swelling.  Endocrine: Positive for polydipsia. Negative for polyuria.  Genitourinary: Negative for difficulty urinating.  Neurological: Negative for dizziness, seizures, syncope, weakness, light-headedness and headaches.      Objective:    Physical Exam Vitals reviewed.  Constitutional:      Appearance: Normal appearance.  Cardiovascular:     Rate and Rhythm: Normal rate.  Pulmonary:     Effort: Pulmonary effort is normal.     Breath sounds: Normal breath sounds.  Musculoskeletal:     Right lower leg: No edema.     Left lower leg: No edema.  Neurological:     General: No focal deficit present.     Mental Status: She is alert.     Cranial Nerves: No cranial nerve deficit.     BP 118/62   Pulse 90   Temp 98.1 F (36.7 C) (Oral)   Ht '5\' 6"'  (1.676 m)   Wt 196 lb (88.9 kg)   SpO2 94%   BMI 31.64 kg/m  Wt Readings from Last 3  Encounters:  01/08/20 196 lb (88.9 kg)  11/04/19 198 lb 12.8 oz (90.2 kg)  09/08/19 193 lb (87.5 kg)     Health Maintenance Due  Topic Date Due  . FOOT EXAM  Never done  . OPHTHALMOLOGY EXAM  Never done  . COVID-19 Vaccine (1) Never done  . TETANUS/TDAP  Never done  . DEXA SCAN  Never done  . PNA vac Low Risk Adult (2 of 2 - PPSV23) 02/23/2015  . INFLUENZA VACCINE  11/22/2019    There are no preventive care reminders to display for this patient.  Lab Results  Component Value Date   TSH 1.42 06/24/2019   Lab Results  Component Value Date   WBC 13.8 (H) 12/27/2018   HGB 14.6 12/27/2018   HCT 45.7 12/27/2018   MCV 95.0 12/27/2018   PLT 297 12/27/2018   Lab Results  Component Value Date   NA 145 (H) 09/08/2019   K 5.0 09/08/2019   CO2 28 09/08/2019   GLUCOSE 103 (H) 09/08/2019   BUN 31 (H) 09/08/2019   CREATININE 0.86 09/08/2019   BILITOT 0.9 10/09/2018   ALKPHOS 80 10/09/2018   AST 17 10/09/2018   ALT 11 10/09/2018   PROT 7.0 10/09/2018   ALBUMIN 4.3 10/09/2018   CALCIUM 10.0 09/08/2019   ANIONGAP 11 12/27/2018   GFR 66.77 06/24/2019   Lab Results  Component Value Date   CHOL 144 10/09/2018   Lab Results  Component Value Date   HDL 40 10/09/2018   Lab Results  Component Value Date   LDLCALC 72 10/09/2018   Lab Results  Component Value Date   TRIG 159 (H) 10/09/2018   Lab Results  Component  Value Date   CHOLHDL 3.6 10/09/2018   Lab Results  Component Value Date   HGBA1C 6.3 (A) 01/08/2020      Assessment & Plan:   #1 type 2 diabetes history.  Recent complaints of polyuria.  No change in medication.  Fasting blood sugar today 124 with A1c of 6.3% which is actually down from 6.6% a few months ago.  -Continue close home monitoring.  Be in touch if fasting glucose consistently greater than 130-140 -We discussed importance of reducing high glycemic foods especially with things like sweet tea.  Suspect with minor dietary modification her blood  sugars will improve even further  #2 history of chronic A. fib on Eliquis.  Rate controlled.  Continue Eliquis and metoprolol  #3 peripheral edema.  This is stable on current medication regimen. -Recheck basic metabolic panel at follow-up  -Flu vaccine given  No orders of the defined types were placed in this encounter.   Follow-up: No follow-ups on file.    Carolann Littler, MD

## 2020-01-08 NOTE — Patient Instructions (Signed)
Your A1C is 6.3% which is actually improved  Monitor fasting glucose and be in touch if consistently > 130-140.   Diabetes Mellitus and Nutrition, Adult When you have diabetes (diabetes mellitus), it is very important to have healthy eating habits because your blood sugar (glucose) levels are greatly affected by what you eat and drink. Eating healthy foods in the appropriate amounts, at about the same times every day, can help you:  Control your blood glucose.  Lower your risk of heart disease.  Improve your blood pressure.  Reach or maintain a healthy weight. Every person with diabetes is different, and each person has different needs for a meal plan. Your health care provider may recommend that you work with a diet and nutrition specialist (dietitian) to make a meal plan that is best for you. Your meal plan may vary depending on factors such as:  The calories you need.  The medicines you take.  Your weight.  Your blood glucose, blood pressure, and cholesterol levels.  Your activity level.  Other health conditions you have, such as heart or kidney disease. How do carbohydrates affect me? Carbohydrates, also called carbs, affect your blood glucose level more than any other type of food. Eating carbs naturally raises the amount of glucose in your blood. Carb counting is a method for keeping track of how many carbs you eat. Counting carbs is important to keep your blood glucose at a healthy level, especially if you use insulin or take certain oral diabetes medicines. It is important to know how many carbs you can safely have in each meal. This is different for every person. Your dietitian can help you calculate how many carbs you should have at each meal and for each snack. Foods that contain carbs include:  Bread, cereal, rice, pasta, and crackers.  Potatoes and corn.  Peas, beans, and lentils.  Milk and yogurt.  Fruit and juice.  Desserts, such as cakes, cookies, ice cream,  and candy. How does alcohol affect me? Alcohol can cause a sudden decrease in blood glucose (hypoglycemia), especially if you use insulin or take certain oral diabetes medicines. Hypoglycemia can be a life-threatening condition. Symptoms of hypoglycemia (sleepiness, dizziness, and confusion) are similar to symptoms of having too much alcohol. If your health care provider says that alcohol is safe for you, follow these guidelines:  Limit alcohol intake to no more than 1 drink per day for nonpregnant women and 2 drinks per day for men. One drink equals 12 oz of beer, 5 oz of wine, or 1 oz of hard liquor.  Do not drink on an empty stomach.  Keep yourself hydrated with water, diet soda, or unsweetened iced tea.  Keep in mind that regular soda, juice, and other mixers may contain a lot of sugar and must be counted as carbs. What are tips for following this plan?  Reading food labels  Start by checking the serving size on the "Nutrition Facts" label of packaged foods and drinks. The amount of calories, carbs, fats, and other nutrients listed on the label is based on one serving of the item. Many items contain more than one serving per package.  Check the total grams (g) of carbs in one serving. You can calculate the number of servings of carbs in one serving by dividing the total carbs by 15. For example, if a food has 30 g of total carbs, it would be equal to 2 servings of carbs.  Check the number of grams (g) of saturated  and trans fats in one serving. Choose foods that have low or no amount of these fats.  Check the number of milligrams (mg) of salt (sodium) in one serving. Most people should limit total sodium intake to less than 2,300 mg per day.  Always check the nutrition information of foods labeled as "low-fat" or "nonfat". These foods may be higher in added sugar or refined carbs and should be avoided.  Talk to your dietitian to identify your daily goals for nutrients listed on the  label. Shopping  Avoid buying canned, premade, or processed foods. These foods tend to be high in fat, sodium, and added sugar.  Shop around the outside edge of the grocery store. This includes fresh fruits and vegetables, bulk grains, fresh meats, and fresh dairy. Cooking  Use low-heat cooking methods, such as baking, instead of high-heat cooking methods like deep frying.  Cook using healthy oils, such as olive, canola, or sunflower oil.  Avoid cooking with butter, cream, or high-fat meats. Meal planning  Eat meals and snacks regularly, preferably at the same times every day. Avoid going long periods of time without eating.  Eat foods high in fiber, such as fresh fruits, vegetables, beans, and whole grains. Talk to your dietitian about how many servings of carbs you can eat at each meal.  Eat 4-6 ounces (oz) of lean protein each day, such as lean meat, chicken, fish, eggs, or tofu. One oz of lean protein is equal to: ? 1 oz of meat, chicken, or fish. ? 1 egg. ?  cup of tofu.  Eat some foods each day that contain healthy fats, such as avocado, nuts, seeds, and fish. Lifestyle  Check your blood glucose regularly.  Exercise regularly as told by your health care provider. This may include: ? 150 minutes of moderate-intensity or vigorous-intensity exercise each week. This could be brisk walking, biking, or water aerobics. ? Stretching and doing strength exercises, such as yoga or weightlifting, at least 2 times a week.  Take medicines as told by your health care provider.  Do not use any products that contain nicotine or tobacco, such as cigarettes and e-cigarettes. If you need help quitting, ask your health care provider.  Work with a Social worker or diabetes educator to identify strategies to manage stress and any emotional and social challenges. Questions to ask a health care provider  Do I need to meet with a diabetes educator?  Do I need to meet with a dietitian?  What  number can I call if I have questions?  When are the best times to check my blood glucose? Where to find more information:  American Diabetes Association: diabetes.org  Academy of Nutrition and Dietetics: www.eatright.[employer] of Diabetes and Digestive and Kidney Diseases (NIH): [employer] Summary  A healthy meal plan will help you control your blood glucose and maintain a healthy lifestyle.  Working with a diet and nutrition specialist (dietitian) can help you make a meal plan that is best for you.  Keep in mind that carbohydrates (carbs) and alcohol have immediate effects on your blood glucose levels. It is important to count carbs and to use alcohol carefully. This information is not intended to replace advice given to you by your health care provider. Make sure you discuss any questions you have with your health care provider. Document Revised: 03/22/2017 Document Reviewed: 05/14/2016 Elsevier Patient Education  2020 [employer].

## 2020-01-25 ENCOUNTER — Telehealth: Payer: Self-pay

## 2020-01-25 NOTE — Progress Notes (Signed)
  I have attempted without success to contact this patient by phone three times to do her hypertension Disease State call. I was unable to leave a Voice message for patient to return my call.  Everlean Cherry Clinical Pharmacist Assistant [phone]

## 2020-02-22 ENCOUNTER — Telehealth: Payer: Self-pay

## 2020-02-22 ENCOUNTER — Telehealth: Payer: PPO

## 2020-02-22 NOTE — Progress Notes (Signed)
Unable to leave a message to  confirmed patient telephone appointment on 02/23/2020 for CCM at 2:00pm with Angelena Sole the Clinical pharmacist.   Everlean Cherry Clinical Pharmacist Assistant [phone]

## 2020-02-23 ENCOUNTER — Telehealth: Payer: PPO

## 2020-02-29 ENCOUNTER — Ambulatory Visit: Payer: PPO | Admitting: Student

## 2020-03-03 ENCOUNTER — Ambulatory Visit: Payer: PPO | Admitting: Student

## 2020-03-21 ENCOUNTER — Telehealth: Payer: Self-pay | Admitting: Student

## 2020-03-21 MED ORDER — METOPROLOL TARTRATE 100 MG PO TABS: 50.0000 mg | ORAL_TABLET | Freq: Two times a day (BID) | ORAL | 2 refills | Status: DC

## 2020-03-21 NOTE — Telephone Encounter (Signed)
*  STAT* If patient is at the pharmacy, call can be transferred to refill team.   1. Which medications need to be refilled? (please list name of each medication and dose if known) Metoprolol   2. Which pharmacy/location (including street and city if local pharmacy) is medication to be sent to? Mail order  3. Do they need a 30 day or 90 day supply? 90

## 2020-03-21 NOTE — Telephone Encounter (Signed)
Pt's medication was sent to pt's pharmacy as requested. Confirmation received.  °

## 2020-03-22 ENCOUNTER — Ambulatory Visit: Payer: PPO | Admitting: Student

## 2020-04-25 ENCOUNTER — Telehealth: Payer: Self-pay | Admitting: Family Medicine

## 2020-04-25 NOTE — Telephone Encounter (Signed)
Left message for patient to call back and schedule Medicare Annual Wellness Visit (AWV) either virtually or in office.   Last AWV no information please schedule at anytime with [facility] Nurse Health Advisor 1 or 2   This should be a 45 minute visit. 

## 2020-06-14 ENCOUNTER — Other Ambulatory Visit: Payer: Self-pay

## 2020-06-14 MED ORDER — RAMIPRIL 10 MG PO CAPS: 20.0000 mg | ORAL_CAPSULE | Freq: Every day | ORAL | 1 refills | Status: DC

## 2020-07-05 ENCOUNTER — Ambulatory Visit: Payer: PPO | Admitting: Student

## 2020-07-19 ENCOUNTER — Encounter: Payer: Self-pay | Admitting: Student

## 2020-07-19 ENCOUNTER — Other Ambulatory Visit: Payer: Self-pay

## 2020-07-19 ENCOUNTER — Ambulatory Visit: Payer: PPO | Admitting: Student

## 2020-07-19 VITALS — BP 126/78 | HR 86 | Ht 66.0 in | Wt 200.0 lb

## 2020-07-19 DIAGNOSIS — I5022 Chronic systolic (congestive) heart failure: Secondary | ICD-10-CM

## 2020-07-19 DIAGNOSIS — E119 Type 2 diabetes mellitus without complications: Secondary | ICD-10-CM

## 2020-07-19 DIAGNOSIS — I10 Essential (primary) hypertension: Secondary | ICD-10-CM

## 2020-07-19 DIAGNOSIS — I872 Venous insufficiency (chronic) (peripheral): Secondary | ICD-10-CM

## 2020-07-19 DIAGNOSIS — I4811 Longstanding persistent atrial fibrillation: Secondary | ICD-10-CM

## 2020-07-19 NOTE — Patient Instructions (Signed)
Medication Instructions:  Your physician recommends that you continue on your current medications as directed. Please refer to the Current Medication list given to you today.  *If you need a refill on your cardiac medications before your next appointment, please call your pharmacy*   Lab Work: TODAY: BMET, CBC, A1C  If you have labs (blood work) drawn today and your tests are completely normal, you will receive your results only by: Marland Kitchen MyChart Message (if you have MyChart) OR . A paper copy in the mail If you have any lab test that is abnormal or we need to change your treatment, we will call you to review the results.   Follow-Up: At [facility], you and your health needs are our priority.  As part of our continuing mission to provide you with exceptional heart care, we have created designated Provider Care Teams.  These Care Teams include your primary Cardiologist (physician) and Advanced Practice Providers (APPs -  Physician Assistants and Nurse Practitioners) who all work together to provide you with the care you need, when you need it.  We recommend signing up for the patient portal called "MyChart".  Sign up information is provided on this After Visit Summary.  MyChart is used to connect with patients for Virtual Visits (Telemedicine).  Patients are able to view lab/test results, encounter notes, upcoming appointments, etc.  Non-urgent messages can be sent to your provider as well.   To learn more about what you can do with MyChart, go to [email or web address].    Your next appointment:   9 month(s)  The format for your next appointment:   In Person  Provider:   Casimiro Needle "Otilio Saber, PA-C

## 2020-07-19 NOTE — Progress Notes (Signed)
Electrophysiology Office Note Date: 07/19/2020  ID:  [name], DOB [birth year], MRN [id number]  PCP: Eulas Post, MD Primary Cardiologist: No primary care provider on file. Electrophysiologist: Thompson Grayer, MD   CC: ILR follow-up  [name] is a 87 y.o. female seen today for Dr. Rayann Heman . she presents today for routine electrophysiology followup.  Since last being seen in our clinic, the patient reports doing very well.  she denies chest pain, palpitations, dyspnea, PND, orthopnea, nausea, vomiting, dizziness, syncope, edema, weight gain, or early satiety.  Device History: Medtronic loop recorder RRT as of 02/2016, implanted 2104 for near syncope and AF.  Past Medical History:  Diagnosis Date  . Anemia   . CAD 09/24/2008   AMI in 1998 tx with PCI to LAD;  myoview 1/12:  no ischemia, EF 45%  . CARDIOMYOPATHY 09/24/2008   ischemic;  echo 4/12:  EF 20-25%, mild MR, mod LAE, mod reduced RVSF, mod RVE, mild RAE, mild TR, PASP 40  . CAROTID BRUIT 09/24/2008  . DIABETES MELLITUS 09/24/2008  . Edema 09/24/2008  . GERD (gastroesophageal reflux disease)   . HYPERLIPIDEMIA 09/24/2008  . HYPERTENSION 09/24/2008  . MI 09/24/2008  . Osteopenia   . Persistent atrial fibrillation ([place]) 09/24/2008   s/p DCCV x 4 in past;  Amiod d/c'd 2/2 abnormal PFTs;  Tikosyn load 6/12 with DCCV  . Tachycardia-bradycardia [facility])    Past Surgical History:  Procedure Laterality Date  . CARDIOVERSION N/A 02/03/2015   Procedure: CARDIOVERSION;  Surgeon: Dorothy Spark, MD;  Location: [facility] ENDOSCOPY;  Service: Cardiovascular;  Laterality: N/A;  . CARDIOVERSION N/A 07/08/2018   Procedure: CARDIOVERSION;  Surgeon: Thayer Headings, MD;  Location: [facility] ENDOSCOPY;  Service: Cardiovascular;  Laterality: N/A;  getting INR prior  . ELECTROPHYSIOLOGIC STUDY N/A 10/15/2014   PVI and CTI ablation by Dr Rayann Heman  . HIP FRACTURE SURGERY  2006  . KNEE SURGERY  2008   broken knee  . LOOP RECORDER IMPLANT N/A 11/06/2012    Procedure: LOOP RECORDER IMPLANT;  Surgeon: Thompson Grayer, MD;  Location: [facility] CATH LAB;  Service: Cardiovascular;  Laterality: N/A;Medtronic LinQ implanted by Dr Rayann Heman for palpitaitons and dizziness  . TEE WITHOUT CARDIOVERSION N/A 10/15/2014   Procedure: TRANSESOPHAGEAL ECHOCARDIOGRAM (TEE);  Surgeon: Thayer Headings, MD;  Location: [facility] ENDOSCOPY;  Service: Cardiovascular;  Laterality: N/A;    Current Outpatient Medications  Medication Sig Dispense Refill  . acetaminophen (TYLENOL) 325 MG tablet Take 325 mg by mouth 2 (two) times daily as needed for moderate pain or headache.    Marland Kitchen apixaban (ELIQUIS) 5 MG TABS tablet Take 1 tablet (5 mg total) by mouth 2 (two) times daily. 180 tablet 1  . aspirin 81 MG EC tablet Take 81 mg by mouth every evening.    Marland Kitchen atorvastatin (LIPITOR) 10 MG tablet Take 0.5 tablets (5 mg total) by mouth daily after supper. 45 tablet 3  . Blood Glucose Monitoring Suppl (ONE TOUCH ULTRA SYSTEM KIT) w/Device KIT 1 kit by Does not apply route once. 1 each 0  . Calcium Carb-Cholecalciferol (CALCIUM 600+D3 PO) Take 1 tablet by mouth daily.    Marland Kitchen glucose blood test strip 1 each by Other route as needed for other. Use as instructed with One Touch glucometer. 100 each 11  . Magnesium Oxide 400 MG CAPS Take 1 capsule (400 mg total) by mouth daily. 90 capsule 3  . metoprolol tartrate (LOPRESSOR) 100 MG tablet Take 0.5 tablets (50 mg total) by mouth  2 (two) times daily. 90 tablet 2  . Multiple Vitamin (MULTIVITAMIN WITH MINERALS) TABS tablet Take 1 tablet by mouth daily. One-A-Day    . ramipril (ALTACE) 10 MG capsule Take 2 capsules (20 mg total) by mouth daily. 180 capsule 1  . vitamin B-12 (CYANOCOBALAMIN) 500 MCG tablet Take 500 mcg by mouth daily.    . vitamin E 400 UNIT capsule Take 400 Units by mouth daily.    . furosemide (LASIX) 40 MG tablet Take 1.5 tablets (60 mg total) by mouth daily. (Patient taking differently: Take 40 mg by mouth daily. ) 180 tablet 3   No current  facility-administered medications for this visit.    Allergies:   Simvastatin   Social History: Social History   Socioeconomic History  . Marital status: Widowed    Spouse name: Not on file  . Number of children: Not on file  . Years of education: Not on file  . Highest education level: Not on file  Occupational History  . Not on file  Tobacco Use  . Smoking status: Former Smoker    Packs/day: 0.50    Years: 20.00    Pack years: 10.00    Types: Cigarettes    Quit date: 07/11/1996    Years since quitting: 24.0  . Smokeless tobacco: Never Used  Substance and Sexual Activity  . Alcohol use: No  . Drug use: No  . Sexual activity: Not on file  Other Topics Concern  . Not on file  Social History Narrative  . Not on file   Social Determinants of Health   Financial Resource Strain: Low Risk   . Difficulty of Paying Living Expenses: Not hard at all  Food Insecurity: Not on file  Transportation Needs: No Transportation Needs  . Lack of Transportation (Medical): No  . Lack of Transportation (Non-Medical): No  Physical Activity: Not on file  Stress: Not on file  Social Connections: Not on file  Intimate Partner Violence: Not on file    Family History: Family History  Problem Relation Age of Onset  . Breast cancer Mother   . Heart disease Mother   . Diabetes Mother   . Alcohol abuse Father   . Breast cancer Sister   . Brain cancer Sister        cause of death at 47 years old  . Alcohol abuse Brother   . Heart disease Brother   . Heart disease Brother   . Cancer Brother        unknown type  . Coronary artery disease Other   . Hypertension Other   . Heart attack Brother   . Heart attack Brother      Review of Systems: All other systems reviewed and are otherwise negative except as noted above.  Physical Exam: Vitals:   07/19/20 1054  BP: 126/78  Pulse: 86  SpO2: 96%  Weight: 200 lb (90.7 kg)  Height: '5\' 6"'  (1.676 m)     GEN- The patient is well  appearing, alert and oriented x 3 today.   HEENT: normocephalic, atraumatic; sclera clear, conjunctiva pink; hearing intact; oropharynx clear; neck supple  Lungs- Clear to ausculation bilaterally, normal work of breathing.  No wheezes, rales, rhonchi Heart- Regular rate and rhythm, no murmurs, rubs or gallops  GI- soft, non-tender, non-distended, bowel sounds present  Extremities- no clubbing, cyanosis, or edema  MS- no significant deformity or atrophy Skin- warm and dry, no rash or lesion; PPM pocket well healed Psych- euthymic mood, full affect  Neuro- strength and sensation are intact  EKG:  EKG is ordered today. The ekg ordered today shows AF with controlled VRs at 86 bpm, QRS 136 ms LBBB pattern.  Recent Labs: 09/08/2019: BUN 31; Creatinine, Ser 0.86; Potassium 5.0; Sodium 145   Wt Readings from Last 3 Encounters:  07/19/20 200 lb (90.7 kg)  01/08/20 196 lb (88.9 kg)  11/04/19 198 lb 12.8 oz (90.2 kg)     Other studies Reviewed: Additional studies/ records that were reviewed today include: Previous EP office notes  Assessment and Plan:  1.Longstanding persistent AF Continue eliquis forCHA2DS2VASC ofat least 7. Denies bleeding.  Continue rate control with lopressor 50 mg BID.   2.Chronic systolic CHF TEE in 1287 showed LVEF 20-25% NYHA III symptoms (chronic)  Volume status stable on exam.  Continue lasix 60 mg daily for now.   Off potassium with borderline high K in the past. Pt agrees to labs today. Continue ramipril 20 mg daily Continue lopressor 50 mg BID Would not challenge with spiro given labile K.  Previously discussed repeating an Echo.Pt has longstanding, known chronic systolic HF. She is aware she meets ICD criteria and we've previously discussed this.She would prefer to avoid ICD. At this time, low utility in repeating Echo as it would not change plan.  3. HTN Continue current management.   4. Chronic venous insufficiency Suspect component of  lymphedema, overall stable. Reinforced compression hose and salt/fluid restriction.   5. DM2 Will check Hgb A1c per pt request and forward to Dr. Elease Hashimoto.    Current medicines are reviewed at length with the patient today.   The patient does not have concerns regarding her medicines.  The following changes were made today:  none  Labs/ tests ordered today include:  Orders Placed This Encounter  Procedures  . Basic metabolic panel  . CBC  . Hemoglobin A1c  . EKG 12-Lead    Disposition:   Follow up with EP APP in 6 Months    Signed, Annamaria Helling  07/19/2020 11:10 AM  [facility] [address] [place]  [place] [phone] (office) [phone] (fax)

## 2020-07-20 LAB — CBC
Hematocrit: 43.8 % (ref 34.0–46.6)
Hemoglobin: 14.5 g/dL (ref 11.1–15.9)
MCH: 30.3 pg (ref 26.6–33.0)
MCHC: 33.1 g/dL (ref 31.5–35.7)
MCV: 92 fL (ref 79–97)
Platelets: 301 10*3/uL (ref 150–450)
RBC: 4.78 x10E6/uL (ref 3.77–5.28)
RDW: 12.7 % (ref 11.7–15.4)
WBC: 9 10*3/uL (ref 3.4–10.8)

## 2020-07-20 LAB — BASIC METABOLIC PANEL
BUN/Creatinine Ratio: 30 — ABNORMAL HIGH (ref 12–28)
BUN: 25 mg/dL (ref 8–27)
CO2: 25 mmol/L (ref 20–29)
Calcium: 10.1 mg/dL (ref 8.7–10.3)
Chloride: 98 mmol/L (ref 96–106)
Creatinine, Ser: 0.83 mg/dL (ref 0.57–1.00)
Glucose: 85 mg/dL (ref 65–99)
Potassium: 4.5 mmol/L (ref 3.5–5.2)
Sodium: 140 mmol/L (ref 134–144)
eGFR: 68 mL/min/{1.73_m2} (ref >59)

## 2020-07-20 LAB — HEMOGLOBIN A1C
Est. average glucose Bld gHb Est-mCnc: 140 mg/dL
Hgb A1c MFr Bld: 6.5 % — ABNORMAL HIGH (ref 4.8–5.6)

## 2020-07-27 ENCOUNTER — Other Ambulatory Visit: Payer: Self-pay

## 2020-07-27 ENCOUNTER — Ambulatory Visit (INDEPENDENT_AMBULATORY_CARE_PROVIDER_SITE_OTHER): Payer: PPO

## 2020-07-27 DIAGNOSIS — Z0000 Encounter for general adult medical examination without abnormal findings: Secondary | ICD-10-CM

## 2020-07-27 NOTE — Patient Instructions (Addendum)
[name] , Thank you for taking time to come for your Medicare Wellness Visit. I appreciate your ongoing commitment to your health goals. Please review the following plan we discussed and let me know if I can assist you in the future.   Screening recommendations/referrals: Colonoscopy: No longer required Mammogram: No longer required Bone Density: Declined Recommended yearly ophthalmology/optometry visit for glaucoma screening and checkup Recommended yearly dental visit for hygiene and checkup  Vaccinations: Influenza vaccine: Due and discussed Pneumococcal vaccine: Due and discussed Tdap vaccine: due and discussed Shingles vaccine: Shingrix discussed. Please contact your pharmacy for coverage information.    Covid-19:Declined  Advanced directives: Please bring a copy of your health care power of attorney and living will to the office at your convenience.  Conditions/risks identified: Be able to walk more    Next appointment: Follow up in one year for your annual wellness visit    Preventive Care 65 Years and Older, Female Preventive care refers to lifestyle choices and visits with your health care provider that can promote health and wellness. What does preventive care include?  A yearly physical exam. This is also called an annual well check.  Dental exams once or twice a year.  Routine eye exams. Ask your health care provider how often you should have your eyes checked.  Personal lifestyle choices, including:  Daily care of your teeth and gums.  Regular physical activity.  Eating a healthy diet.  Avoiding tobacco and drug use.  Limiting alcohol use.  Practicing safe sex.  Taking low-dose aspirin every day.  Taking vitamin and mineral supplements as recommended by your health care provider. What happens during an annual well check? The services and screenings done by your health care provider during your annual well check will depend on your age, overall health,  lifestyle risk factors, and family history of disease. Counseling  Your health care provider may ask you questions about your:  Alcohol use.  Tobacco use.  Drug use.  Emotional well-being.  Home and relationship well-being.  Sexual activity.  Eating habits.  History of falls.  Memory and ability to understand (cognition).  Work and work Astronomer.  Reproductive health. Screening  You may have the following tests or measurements:  Height, weight, and BMI.  Blood pressure.  Lipid and cholesterol levels. These may be checked every 5 years, or more frequently if you are over 68 years old.  Skin check.  Lung cancer screening. You may have this screening every year starting at age 33 if you have a 30-pack-year history of smoking and currently smoke or have quit within the past 15 years.  Fecal occult blood test (FOBT) of the stool. You may have this test every year starting at age 46.  Flexible sigmoidoscopy or colonoscopy. You may have a sigmoidoscopy every 5 years or a colonoscopy every 10 years starting at age 80.  Hepatitis C blood test.  Hepatitis B blood test.  Sexually transmitted disease (STD) testing.  Diabetes screening. This is done by checking your blood sugar (glucose) after you have not eaten for a while (fasting). You may have this done every 1-3 years.  Bone density scan. This is done to screen for osteoporosis. You may have this done starting at age 34.  Mammogram. This may be done every 1-2 years. Talk to your health care provider about how often you should have regular mammograms. Talk with your health care provider about your test results, treatment options, and if necessary, the need for more tests.  Vaccines  Your health care provider may recommend certain vaccines, such as:  Influenza vaccine. This is recommended every year.  Tetanus, diphtheria, and acellular pertussis (Tdap, Td) vaccine. You may need a Td booster every 10 years.  Zoster  vaccine. You may need this after age 24.  Pneumococcal 13-valent conjugate (PCV13) vaccine. One dose is recommended after age 47.  Pneumococcal polysaccharide (PPSV23) vaccine. One dose is recommended after age 37. Talk to your health care provider about which screenings and vaccines you need and how often you need them. This information is not intended to replace advice given to you by your health care provider. Make sure you discuss any questions you have with your health care provider. Document Released: 05/06/2015 Document Revised: 12/28/2015 Document Reviewed: 02/08/2015 Elsevier Interactive Patient Education  2017 [place] Prevention in the Home Falls can cause injuries. They can happen to people of all ages. There are many things you can do to make your home safe and to help prevent falls. What can I do on the outside of my home?  Regularly fix the edges of walkways and driveways and fix any cracks.  Remove anything that might make you trip as you walk through a door, such as a raised step or threshold.  Trim any bushes or trees on the path to your home.  Use bright outdoor lighting.  Clear any walking paths of anything that might make someone trip, such as rocks or tools.  Regularly check to see if handrails are loose or broken. Make sure that both sides of any steps have handrails.  Any raised decks and porches should have guardrails on the edges.  Have any leaves, snow, or ice cleared regularly.  Use sand or salt on walking paths during winter.  Clean up any spills in your garage right away. This includes oil or grease spills. What can I do in the bathroom?  Use night lights.  Install grab bars by the toilet and in the tub and shower. Do not use towel bars as grab bars.  Use non-skid mats or decals in the tub or shower.  If you need to sit down in the shower, use a plastic, non-slip stool.  Keep the floor dry. Clean up any water that spills on the  floor as soon as it happens.  Remove soap buildup in the tub or shower regularly.  Attach bath mats securely with double-sided non-slip rug tape.  Do not have throw rugs and other things on the floor that can make you trip. What can I do in the bedroom?  Use night lights.  Make sure that you have a light by your bed that is easy to reach.  Do not use any sheets or blankets that are too big for your bed. They should not hang down onto the floor.  Have a firm chair that has side arms. You can use this for support while you get dressed.  Do not have throw rugs and other things on the floor that can make you trip. What can I do in the kitchen?  Clean up any spills right away.  Avoid walking on wet floors.  Keep items that you use a lot in easy-to-reach places.  If you need to reach something above you, use a strong step stool that has a grab bar.  Keep electrical cords out of the way.  Do not use floor polish or wax that makes floors slippery. If you must use wax, use non-skid floor wax.  Do not have throw rugs and other things on the floor that can make you trip. What can I do with my stairs?  Do not leave any items on the stairs.  Make sure that there are handrails on both sides of the stairs and use them. Fix handrails that are broken or loose. Make sure that handrails are as long as the stairways.  Check any carpeting to make sure that it is firmly attached to the stairs. Fix any carpet that is loose or worn.  Avoid having throw rugs at the top or bottom of the stairs. If you do have throw rugs, attach them to the floor with carpet tape.  Make sure that you have a light switch at the top of the stairs and the bottom of the stairs. If you do not have them, ask someone to add them for you. What else can I do to help prevent falls?  Wear shoes that:  Do not have high heels.  Have rubber bottoms.  Are comfortable and fit you well.  Are closed at the toe. Do not wear  sandals.  If you use a stepladder:  Make sure that it is fully opened. Do not climb a closed stepladder.  Make sure that both sides of the stepladder are locked into place.  Ask someone to hold it for you, if possible.  Clearly mark and make sure that you can see:  Any grab bars or handrails.  First and last steps.  Where the edge of each step is.  Use tools that help you move around (mobility aids) if they are needed. These include:  Canes.  Walkers.  Scooters.  Crutches.  Turn on the lights when you go into a dark area. Replace any light bulbs as soon as they burn out.  Set up your furniture so you have a clear path. Avoid moving your furniture around.  If any of your floors are uneven, fix them.  If there are any pets around you, be aware of where they are.  Review your medicines with your doctor. Some medicines can make you feel dizzy. This can increase your chance of falling. Ask your doctor what other things that you can do to help prevent falls. This information is not intended to replace advice given to you by your health care provider. Make sure you discuss any questions you have with your health care provider. Document Released: 02/03/2009 Document Revised: 09/15/2015 Document Reviewed: 05/14/2014 Elsevier Interactive Patient Education  2017 [employer].

## 2020-07-27 NOTE — Progress Notes (Signed)
Virtual Visit via Telephone Note  I connected with  [name] on 07/27/20 at 10:15 AM EDT by telephone and verified that I am speaking with the correct person using two identifiers.  Medicare Annual Wellness visit completed telephonically due to Covid-19 pandemic.   Persons participating in this call: This Health Coach and this patient.   Location: Patient: Home Provider: Office   I discussed the limitations, risks, security and privacy concerns of performing an evaluation and management service by telephone and the availability of in person appointments. The patient expressed understanding and agreed to proceed.  Unable to perform video visit due to video visit attempted and failed and/or patient does not have video capability.   Some vital signs may be absent or patient reported.   [name], LPN    Subjective:   [name] is a 87 y.o. female who presents for an Initial Medicare Annual Wellness Visit.  Review of Systems     Cardiac Risk Factors include: advanced age (>61mn, >>52women);hypertension;dyslipidemia;obesity (BMI >30kg/m2)     Objective:    There were no vitals filed for this visit. There is no height or weight on file to calculate BMI.  Advanced Directives 07/27/2020 12/27/2018 07/08/2018 03/13/2018 10/13/2016 02/03/2015 10/15/2014  Does Patient Have a Medical Advance Directive? Yes No No No Yes No No  Type of Advance Directive [place] - -  Does patient want to make changes to medical advance directive? - - - - No - Patient declined - -  Copy of [place] Chart? No - copy requested - - - - - -  Would patient like information on creating a medical advance directive? - No - Patient declined No - Patient declined No - Patient declined - No - patient declined information No - patient declined information  Pre-existing out of facility DNR order (yellow form or pink MOST  form) - - - - - - -    Current Medications (verified) Outpatient Encounter Medications as of 07/27/2020  Medication Sig  . acetaminophen (TYLENOL) 325 MG tablet Take 325 mg by mouth 2 (two) times daily as needed for moderate pain or headache.  .Marland Kitchenapixaban (ELIQUIS) 5 MG TABS tablet Take 1 tablet (5 mg total) by mouth 2 (two) times daily.  .Marland Kitchenaspirin 81 MG EC tablet Take 81 mg by mouth every evening.  .Marland Kitchenatorvastatin (LIPITOR) 10 MG tablet Take 0.5 tablets (5 mg total) by mouth daily after supper.  . Calcium Carb-Cholecalciferol (CALCIUM 600+D3 PO) Take 1 tablet by mouth daily.  . Magnesium Oxide 400 MG CAPS Take 1 capsule (400 mg total) by mouth daily.  . metoprolol tartrate (LOPRESSOR) 100 MG tablet Take 0.5 tablets (50 mg total) by mouth 2 (two) times daily.  . Multiple Vitamin (MULTIVITAMIN WITH MINERALS) TABS tablet Take 1 tablet by mouth daily. One-A-Day  . ramipril (ALTACE) 10 MG capsule Take 2 capsules (20 mg total) by mouth daily.  . vitamin B-12 (CYANOCOBALAMIN) 500 MCG tablet Take 500 mcg by mouth daily.  . vitamin E 400 UNIT capsule Take 400 Units by mouth daily.  . Blood Glucose Monitoring Suppl (ONE TOUCH ULTRA SYSTEM KIT) w/Device KIT 1 kit by Does not apply route once. (Patient not taking: Reported on 07/27/2020)  . furosemide (LASIX) 40 MG tablet Take 1.5 tablets (60 mg total) by mouth daily. (Patient taking differently: Take 40 mg by mouth daily. )  . glucose  blood test strip 1 each by Other route as needed for other. Use as instructed with One Touch glucometer. (Patient not taking: Reported on 07/27/2020)   No facility-administered encounter medications on file as of 07/27/2020.    Allergies (verified) Simvastatin   History: Past Medical History:  Diagnosis Date  . Anemia   . CAD 09/24/2008   AMI in 1998 tx with PCI to LAD;  myoview 1/12:  no ischemia, EF 45%  . CARDIOMYOPATHY 09/24/2008   ischemic;  echo 4/12:  EF 20-25%, mild MR, mod LAE, mod reduced RVSF, mod RVE, mild RAE,  mild TR, PASP 40  . CAROTID BRUIT 09/24/2008  . DIABETES MELLITUS 09/24/2008  . Edema 09/24/2008  . GERD (gastroesophageal reflux disease)   . HYPERLIPIDEMIA 09/24/2008  . HYPERTENSION 09/24/2008  . MI 09/24/2008  . Osteopenia   . Persistent atrial fibrillation ([place]) 09/24/2008   s/p DCCV x 4 in past;  Amiod d/c'd 2/2 abnormal PFTs;  Tikosyn load 6/12 with DCCV  . Tachycardia-bradycardia [facility])    Past Surgical History:  Procedure Laterality Date  . CARDIOVERSION N/A 02/03/2015   Procedure: CARDIOVERSION;  Surgeon: Dorothy Spark, MD;  Location: [facility] ENDOSCOPY;  Service: Cardiovascular;  Laterality: N/A;  . CARDIOVERSION N/A 07/08/2018   Procedure: CARDIOVERSION;  Surgeon: Thayer Headings, MD;  Location: [facility] ENDOSCOPY;  Service: Cardiovascular;  Laterality: N/A;  getting INR prior  . ELECTROPHYSIOLOGIC STUDY N/A 10/15/2014   PVI and CTI ablation by Dr Rayann Heman  . HIP FRACTURE SURGERY  2006  . KNEE SURGERY  2008   broken knee  . LOOP RECORDER IMPLANT N/A 11/06/2012   Procedure: LOOP RECORDER IMPLANT;  Surgeon: Thompson Grayer, MD;  Location[facility] CATH LAB;  Service: Cardiovascular;  Laterality: N/A;Medtronic LinQ implanted by Dr Rayann Heman for palpitaitons and dizziness  . TEE WITHOUT CARDIOVERSION N/A 10/15/2014   Procedure: TRANSESOPHAGEAL ECHOCARDIOGRAM (TEE);  Surgeon: Thayer Headings, MD;  Location: [facility] ENDOSCOPY;  Service: Cardiovascular;  Laterality: N/A;   Family History  Problem Relation Age of Onset  . Breast cancer Mother   . Heart disease Mother   . Diabetes Mother   . Alcohol abuse Father   . Breast cancer Sister   . Brain cancer Sister        cause of death at 28 years old  . Alcohol abuse Brother   . Heart disease Brother   . Heart disease Brother   . Cancer Brother        unknown type  . Coronary artery disease Other   . Hypertension Other   . Heart attack Brother   . Heart attack Brother    Social History   Socioeconomic History  . Marital status: Widowed    Spouse name: Not on  file  . Number of children: Not on file  . Years of education: Not on file  . Highest education level: Not on file  Occupational History  . Not on file  Tobacco Use  . Smoking status: Former Smoker    Packs/day: 0.50    Years: 20.00    Pack years: 10.00    Types: Cigarettes    Quit date: 07/11/1996    Years since quitting: 24.0  . Smokeless tobacco: Never Used  Substance and Sexual Activity  . Alcohol use: No  . Drug use: No  . Sexual activity: Not on file  Other Topics Concern  . Not on file  Social History Narrative  . Not on file   Social Determinants of Health  Financial Resource Strain: Low Risk   . Difficulty of Paying Living Expenses: Not hard at all  Food Insecurity: No Food Insecurity  . Worried About Charity fundraiser in the Last Year: Never true  . Ran Out of Food in the Last Year: Never true  Transportation Needs: No Transportation Needs  . Lack of Transportation (Medical): No  . Lack of Transportation (Non-Medical): No  Physical Activity: Inactive  . Days of Exercise per Week: 0 days  . Minutes of Exercise per Session: 0 min  Stress: No Stress Concern Present  . Feeling of Stress : Not at all  Social Connections: Moderately Isolated  . Frequency of Communication with Friends and Family: More than three times a week  . Frequency of Social Gatherings with Friends and Family: Once a week  . Attends Religious Services: 1 to 4 times per year  . Active Member of Clubs or Organizations: No  . Attends Archivist Meetings: Never  . Marital Status: Widowed    Tobacco Counseling Counseling given: Not Answered   Clinical Intake:  Pre-visit preparation completed: Yes  Pain : No/denies pain     BMI - recorded: 32.3 Nutritional Risks: None Diabetes: Yes CBG done?: No Did pt. bring in CBG monitor from home?: No  How often do you need to have someone help you when you read instructions, pamphlets, or other written materials from your doctor or  pharmacy?: 1 - Never  Diabetic?Nutrition Risk Assessment:  Has the patient had any N/V/D within the last 2 months?  No  Does the patient have any non-healing wounds?  No  Has the patient had any unintentional weight loss or weight gain?  No   Diabetes:  Is the patient diabetic?  Yes  If diabetic, was a CBG obtained today?  No  Did the patient bring in their glucometer from home?  No  How often do you monitor your CBG's? N/A.   Financial Strains and Diabetes Management:  Are you having any financial strains with the device, your supplies or your medication? No .  Does the patient want to be seen by Chronic Care Management for management of their diabetes?  No  Would the patient like to be referred to a Nutritionist or for Diabetic Management?  No   Diabetic Exams:  Diabetic Eye Exam: Overdue for diabetic eye exam. Pt has been advised about the importance in completing this exam. Patient advised to call and schedule an eye exam. Diabetic Foot Exam: Overdue, Pt has been advised about the importance in completing this exam. Pt is scheduled for diabetic foot exam on next appt.   Interpreter Needed?: No  Information entered by :: Charlott Rakes, LPN   Activities of Daily Living In your present state of health, do you have any difficulty performing the following activities: 07/27/2020  Hearing? Y  Comment mild loss  Vision? N  Difficulty concentrating or making decisions? N  Walking or climbing stairs? Y  Comment use a ramp  Dressing or bathing? Y  Comment difficult and sore at times grandaughter helps  Doing errands, shopping? N  Preparing Food and eating ? N  Using the Toilet? N  In the past six months, have you accidently leaked urine? Y  Comment soeties urgency  Do you have problems with loss of bowel control? N  Managing your Medications? N  Managing your Finances? N  Housekeeping or managing your Housekeeping? N  Some recent data might be hidden    Patient Care  Team: Eulas Post, MD as PCP - General (Family Medicine) Thompson Grayer, MD as PCP - Electrophysiology (Cardiology) Germaine Pomfret, [facility] as Pharmacist (Pharmacist)  Indicate any recent Medical Services you may have received from other than [facility] providers in the past year (date may be approximate).     Assessment:   This is a routine wellness examination for [place].  Hearing/Vision screen  Hearing Screening   '125Hz'  '250Hz'  '500Hz'  '1000Hz'  '2000Hz'  '3000Hz'  '4000Hz'  '6000Hz'  '8000Hz'   Right ear:           Left ear:           Comments: Pt states mild loss per her children   Vision Screening Comments: Pt hasn't followed up will need an appt   Dietary issues and exercise activities discussed: Current Exercise Habits: The patient does not participate in regular exercise at present  Goals    . Patient [place] (see longitudinal plan of care for additional care plan information)  Current Barriers:  . Chronic Disease Management support, education, and care coordination needs related to Hypertension, Hyperlipidemia, Diabetes, Atrial Fibrillation, Heart Failure, Coronary Artery Disease, Osteopenia and Gout    Hypertension BP Readings from Last 3 Encounters:  11/04/19 126/80  09/08/19 128/80  08/27/19 130/64   . Pharmacist Clinical Goal(s): o Over the next 90 days, patient will work with PharmD and providers to maintain BP goal <130/80 . Current regimen:   Furosemide 40 mg daily   Metoprolol tartrate 100 mg 1/2 tablet twice daily   Ramipril 10 mg 1 capsule twice daily  . Interventions: o Discussed low salt diet and exercising as tolerated extensively o Weighing daily; if you gain more than 3 pounds in one day or 5 pounds in one week call your doctor. . Patient self care activities - Over the next 90 days, patient will: o Check blood pressure weekly, document, and provide at future appointments o Ensure daily salt intake < 2300 mg/day  Hyperlipidemia Lab Results   Component Value Date/Time   LDLCALC 72 10/09/2018 12:00 AM   . Pharmacist Clinical Goal(s): o Over the next 90 days, patient will work with PharmD and providers to achieve LDL goal < 70 . Current regimen:  o Atorvastatin 10 mg 1/2 tablet daily  . Interventions: o Discussed low cholesterol diet and exercising as tolerated extensively  Diabetes Lab Results  Component Value Date/Time   HGBA1C 6.6 (H) 07/14/2019 08:54 AM   HGBA1C 5.8 02/22/2014 10:54 AM   . Pharmacist Clinical Goal(s): o Over the next 90 days, patient will work with PharmD and providers to maintain A1c goal <7% . Current regimen:  o None . Interventions: o Discussed carbohydrate counting and exercising as tolerated extensively . Patient self care activities - Over the next 90 days, patient will: o Check blood sugar monthly , document, and provide at future appointments o Contact provider with any episodes of hypoglycemia  Medication management . Pharmacist Clinical Goal(s): o Over the next 90 days, patient will work with PharmD and providers to maintain optimal medication adherence . Current pharmacy: [facility]  . Interventions o Comprehensive medication review performed. o Continue current medication management strategy . Patient self care activities - Over the next 90 days, patient will: o Take medications as prescribed o Report any questions or concerns to PharmD and/or provider(s)    . Patient Stated     Walk more       Depression Screen PHQ 2/9 Scores 07/27/2020 03/13/2018  10/29/2014  PHQ - 2 Score 0 0 0    Fall Risk Fall Risk  07/27/2020 03/13/2019 10/29/2014  Falls in the past year? 0 0 No  Comment - Emmi Telephone Survey: data to providers prior to load -  Number falls in past yr: 0 - -  Injury with Fall? 0 - -  Risk for fall due to : Impaired vision;Impaired mobility;Impaired balance/gait - -  Follow up Falls prevention discussed - -    FALL RISK PREVENTION PERTAINING TO THE HOME:  Any  stairs in or around the home? Yes  If so, are there any without handrails? No  Home free of loose throw rugs in walkways, pet beds, electrical cords, etc? Yes  Adequate lighting in your home to reduce risk of falls? Yes   ASSISTIVE DEVICES UTILIZED TO PREVENT FALLS:  Life alert? No  Use of a cane, walker or w/c? Yes  Grab bars in the bathroom? No  Shower chair or bench in shower? Yes  Elevated toilet seat or a handicapped toilet? Yes   TIMED UP AND GO:  Was the test performed? No     Cognitive Function:     6CIT Screen 07/27/2020  What Year? 0 points  What month? 0 points  Count back from 20 0 points  Months in reverse 0 points  Repeat phrase 2 points    Immunizations Immunization History  Administered Date(s) Administered  . Fluad Quad(high Dose 65+) 01/05/2019  . Influenza Split 02/20/2012  . Influenza, High Dose Seasonal PF 03/03/2015, 02/21/2018  . Influenza,inj,Quad PF,6+ Mos 03/13/2013, 02/08/2014, 02/13/2016, 02/06/2017  . Pneumococcal Conjugate-13 04/23/1998, 02/22/2014    TDAP status: Due, Education has been provided regarding the importance of this vaccine. Advised may receive this vaccine at local pharmacy or Health Dept. Aware to provide a copy of the vaccination record if obtained from local pharmacy or Health Dept. Verbalized acceptance and understanding.  Flu Vaccine status: Due, Education has been provided regarding the importance of this vaccine. Advised may receive this vaccine at local pharmacy or Health Dept. Aware to provide a copy of the vaccination record if obtained from local pharmacy or Health Dept. Verbalized acceptance and understanding.  Pneumococcal vaccine status: Declined,  Education has been provided regarding the importance of this vaccine but patient still declined. Advised may receive this vaccine at local pharmacy or Health Dept. Aware to provide a copy of the vaccination record if obtained from local pharmacy or Health Dept. Verbalized  acceptance and understanding.   Covid-19 vaccine status: Declined, Education has been provided regarding the importance of this vaccine but patient still declined. Advised may receive this vaccine at local pharmacy or Health Dept.or vaccine clinic. Aware to provide a copy of the vaccination record if obtained from local pharmacy or Health Dept. Verbalized acceptance and understanding.  Qualifies for Shingles Vaccine? Yes   Zostavax completed No   Shingrix Completed?: No.    Education has been provided regarding the importance of this vaccine. Patient has been advised to call insurance company to determine out of pocket expense if they have not yet received this vaccine. Advised may also receive vaccine at local pharmacy or Health Dept. Verbalized acceptance and understanding.  Screening Tests Health Maintenance  Topic Date Due  . FOOT EXAM  Never done  . OPHTHALMOLOGY EXAM  Never done  . TETANUS/TDAP  Never done  . PNA vac Low Risk Adult (2 of 2 - PPSV23) 02/23/2015  . COVID-19 Vaccine (1) 08/12/2020 (Originally 09/18/1934)  . DEXA  SCAN  07/27/2021 (Originally 09/18/1996)  . INFLUENZA VACCINE  11/21/2020  . HEMOGLOBIN A1C  01/19/2021  . HPV VACCINES  Aged Out    Health Maintenance  Health Maintenance Due  Topic Date Due  . FOOT EXAM  Never done  . OPHTHALMOLOGY EXAM  Never done  . TETANUS/TDAP  Never done  . PNA vac Low Risk Adult (2 of 2 - PPSV23) 02/23/2015    Colorectal cancer screening: No longer required.   Mammogram status: No longer required due to age.  Bone density declined:    Additional Screening:  Vision Screening: Recommended annual ophthalmology exams for early detection of glaucoma and other disorders of the eye. Is the patient up to date with their annual eye exam?  No  Who is the provider or what is the name of the office in which the patient attends annual eye exams? Pt stated she will make an appt with [facility] If pt is not established with a provider,  would they like to be referred to a provider to establish care? No .   Dental Screening: Recommended annual dental exams for proper oral hygiene  Community Resource Referral / Chronic Care Management: CRR required this visit?  No   CCM required this visit?  No      Plan:     I have personally reviewed and noted the following in the patient's chart:   . Medical and social history . Use of alcohol, tobacco or illicit drugs  . Current medications and supplements . Functional ability and status . Nutritional status . Physical activity . Advanced directives . List of other physicians . Hospitalizations, surgeries, and ER visits in previous 12 months . Vitals . Screenings to include cognitive, depression, and falls . Referrals and appointments  In addition, I have reviewed and discussed with patient certain preventive protocols, quality metrics, and best practice recommendations. A written personalized care plan for preventive services as well as general preventive health recommendations were provided to patient.     [name], LPN   04/29/5535   Nurse Notes: None

## 2020-07-28 ENCOUNTER — Telehealth: Payer: Self-pay | Admitting: Pharmacist

## 2020-07-28 NOTE — Progress Notes (Signed)
    Chronic Care Management Pharmacy Assistant   Name: [name]  MRN: [id number] DOB: 11/23/1929   Reason for Encounter: General Adherence Call    Recent office visits:  07/27/20-Betterson, Tina H, LPN (AWV).  Recent consult visits:  None  Hospital visits:  None in previous 6 months  Medications: Outpatient Encounter Medications as of 07/28/2020  Medication Sig  . acetaminophen (TYLENOL) 325 MG tablet Take 325 mg by mouth 2 (two) times daily as needed for moderate pain or headache.  Marland Kitchen apixaban (ELIQUIS) 5 MG TABS tablet Take 1 tablet (5 mg total) by mouth 2 (two) times daily.  Marland Kitchen aspirin 81 MG EC tablet Take 81 mg by mouth every evening.  Marland Kitchen atorvastatin (LIPITOR) 10 MG tablet Take 0.5 tablets (5 mg total) by mouth daily after supper.  . Blood Glucose Monitoring Suppl (ONE TOUCH ULTRA SYSTEM KIT) w/Device KIT 1 kit by Does not apply route once. (Patient not taking: Reported on 07/27/2020)  . Calcium Carb-Cholecalciferol (CALCIUM 600+D3 PO) Take 1 tablet by mouth daily.  . furosemide (LASIX) 40 MG tablet Take 1.5 tablets (60 mg total) by mouth daily. (Patient taking differently: Take 40 mg by mouth daily. )  . glucose blood test strip 1 each by Other route as needed for other. Use as instructed with One Touch glucometer. (Patient not taking: Reported on 07/27/2020)  . Magnesium Oxide 400 MG CAPS Take 1 capsule (400 mg total) by mouth daily.  . metoprolol tartrate (LOPRESSOR) 100 MG tablet Take 0.5 tablets (50 mg total) by mouth 2 (two) times daily.  . Multiple Vitamin (MULTIVITAMIN WITH MINERALS) TABS tablet Take 1 tablet by mouth daily. One-A-Day  . ramipril (ALTACE) 10 MG capsule Take 2 capsules (20 mg total) by mouth daily.  . vitamin B-12 (CYANOCOBALAMIN) 500 MCG tablet Take 500 mcg by mouth daily.  . vitamin E 400 UNIT capsule Take 400 Units by mouth daily.   No facility-administered encounter medications on file as of 07/28/2020.    Note: Called and spoke with the patient to  coordinate any assistance needed. Patient voiced she is doing fine and she is not having any adherence issues with medications at this time. I told the patient please feel free to call me at number provided over phone call if she is requiring future assistance. Patient voiced understanding.  Star Rating Drugs: Atorvastatin 10 MG: 90 DS, last filled on 06/06/20 at [facility]. Ramipril 10 MG: 90 DS, last filled on 06/15/20 at [facility].  Jeni Salles, CPP Notified.  Raynelle Highland, [place] Pharmacist Assistant [phone] CCM Total Time: 23 Minutes

## 2020-08-08 ENCOUNTER — Other Ambulatory Visit: Payer: Self-pay

## 2020-08-08 MED ORDER — ATORVASTATIN CALCIUM 10 MG PO TABS: 5.0000 mg | ORAL_TABLET | Freq: Every day | ORAL | 3 refills | Status: DC

## 2020-08-23 ENCOUNTER — Telehealth: Payer: Self-pay | Admitting: Family Medicine

## 2020-08-23 NOTE — Telephone Encounter (Signed)
Pts daughter Junious Dresser on [place]) is calling in stating that she is not sure if pt needs to have a A1C check and other labs done.  Pts daughter would like to have a call back so that they are able to schedule whatever is needed.

## 2020-08-23 NOTE — Telephone Encounter (Signed)
Pt is not due for an A1C until 10/19/2020. Please advise on other labs.

## 2020-08-25 NOTE — Telephone Encounter (Signed)
Spoke with the patient. She is aware of Dr. [name]'s message below.  

## 2020-09-06 ENCOUNTER — Telehealth: Payer: Self-pay | Admitting: Family Medicine

## 2020-09-06 MED ORDER — ONETOUCH ULTRA 2 W/DEVICE KIT: PACK | 0 refills | Status: DC

## 2020-09-06 NOTE — Telephone Encounter (Signed)
Rx sent in. Nothing further needed.  

## 2020-09-06 NOTE — Telephone Encounter (Signed)
Patient daughter is calling and wanted to see if provider could put in an order for patient to get a one touch glucose monitor and send to   [facility] [address], [place] -   [address] [place] [address]  Phone:  [phone] Fax:  [phone]  CB is [phone]

## 2020-10-03 ENCOUNTER — Encounter (HOSPITAL_BASED_OUTPATIENT_CLINIC_OR_DEPARTMENT_OTHER): Payer: Self-pay | Admitting: Obstetrics and Gynecology

## 2020-10-03 ENCOUNTER — Emergency Department (HOSPITAL_BASED_OUTPATIENT_CLINIC_OR_DEPARTMENT_OTHER): Payer: PPO | Admitting: Radiology

## 2020-10-03 ENCOUNTER — Emergency Department (HOSPITAL_BASED_OUTPATIENT_CLINIC_OR_DEPARTMENT_OTHER)
Admission: EM | Admit: 2020-10-03 | Discharge: 2020-10-03 | Disposition: A | Discharge status: Home / Self Care | Payer: PPO | Attending: Emergency Medicine | Admitting: Emergency Medicine

## 2020-10-03 ENCOUNTER — Emergency Department (HOSPITAL_BASED_OUTPATIENT_CLINIC_OR_DEPARTMENT_OTHER): Payer: PPO

## 2020-10-03 ENCOUNTER — Other Ambulatory Visit: Payer: Self-pay

## 2020-10-03 DIAGNOSIS — W19XXXA Unspecified fall, initial encounter: Secondary | ICD-10-CM

## 2020-10-03 DIAGNOSIS — Z7982 Long term (current) use of aspirin: Secondary | ICD-10-CM | POA: Diagnosis not present

## 2020-10-03 DIAGNOSIS — S92332A Displaced fracture of third metatarsal bone, left foot, initial encounter for closed fracture: Secondary | ICD-10-CM | POA: Diagnosis not present

## 2020-10-03 DIAGNOSIS — S92322A Displaced fracture of second metatarsal bone, left foot, initial encounter for closed fracture: Secondary | ICD-10-CM | POA: Insufficient documentation

## 2020-10-03 DIAGNOSIS — S92342A Displaced fracture of fourth metatarsal bone, left foot, initial encounter for closed fracture: Secondary | ICD-10-CM | POA: Insufficient documentation

## 2020-10-03 DIAGNOSIS — S9032XA Contusion of left foot, initial encounter: Secondary | ICD-10-CM | POA: Diagnosis not present

## 2020-10-03 DIAGNOSIS — E119 Type 2 diabetes mellitus without complications: Secondary | ICD-10-CM | POA: Diagnosis not present

## 2020-10-03 DIAGNOSIS — I110 Hypertensive heart disease with heart failure: Secondary | ICD-10-CM | POA: Diagnosis not present

## 2020-10-03 DIAGNOSIS — W1839XA Other fall on same level, initial encounter: Secondary | ICD-10-CM | POA: Insufficient documentation

## 2020-10-03 DIAGNOSIS — Z7901 Long term (current) use of anticoagulants: Secondary | ICD-10-CM | POA: Diagnosis not present

## 2020-10-03 DIAGNOSIS — Z79899 Other long term (current) drug therapy: Secondary | ICD-10-CM | POA: Diagnosis not present

## 2020-10-03 DIAGNOSIS — I509 Heart failure, unspecified: Secondary | ICD-10-CM | POA: Insufficient documentation

## 2020-10-03 DIAGNOSIS — I10 Essential (primary) hypertension: Secondary | ICD-10-CM | POA: Diagnosis not present

## 2020-10-03 DIAGNOSIS — Y998 Other external cause status: Secondary | ICD-10-CM | POA: Insufficient documentation

## 2020-10-03 DIAGNOSIS — S99922A Unspecified injury of left foot, initial encounter: Secondary | ICD-10-CM | POA: Diagnosis present

## 2020-10-03 DIAGNOSIS — S8002XA Contusion of left knee, initial encounter: Secondary | ICD-10-CM | POA: Insufficient documentation

## 2020-10-03 DIAGNOSIS — S92909A Unspecified fracture of unspecified foot, initial encounter for closed fracture: Secondary | ICD-10-CM

## 2020-10-03 DIAGNOSIS — S92812A Other fracture of left foot, initial encounter for closed fracture: Secondary | ICD-10-CM | POA: Diagnosis not present

## 2020-10-03 DIAGNOSIS — S92345A Nondisplaced fracture of fourth metatarsal bone, left foot, initial encounter for closed fracture: Secondary | ICD-10-CM | POA: Diagnosis not present

## 2020-10-03 DIAGNOSIS — Z87891 Personal history of nicotine dependence: Secondary | ICD-10-CM | POA: Insufficient documentation

## 2020-10-03 DIAGNOSIS — I2510 Atherosclerotic heart disease of native coronary artery without angina pectoris: Secondary | ICD-10-CM | POA: Insufficient documentation

## 2020-10-03 DIAGNOSIS — Y92009 Unspecified place in unspecified non-institutional (private) residence as the place of occurrence of the external cause: Secondary | ICD-10-CM | POA: Insufficient documentation

## 2020-10-03 MED ORDER — HYDROCODONE-ACETAMINOPHEN 5-325 MG PO TABS: 1.0000 | ORAL_TABLET | ORAL | 0 refills | Status: DC | PRN

## 2020-10-03 MED ORDER — HYDROCODONE-ACETAMINOPHEN 5-325 MG PO TABS
1.0000 | ORAL_TABLET | Freq: Once | ORAL | Status: AC
  Administered 2020-10-03: 1 via ORAL
  Filled 2020-10-03: qty 1

## 2020-10-03 MED ORDER — ACETAMINOPHEN 500 MG PO TABS
1000.0000 mg | ORAL_TABLET | Freq: Once | ORAL | Status: AC
  Administered 2020-10-03: 1000 mg via ORAL
  Filled 2020-10-03: qty 2

## 2020-10-03 NOTE — ED Provider Notes (Signed)
[place] EMERGENCY DEPT Provider Note   CSN: [id number] Arrival date & time: 10/03/20  1527     History Chief Complaint  Patient presents with   Fall    On Bloodthinners    [name] is a 87 y.o. female.  HPI Patient lives home independently.  This morning she had gotten her breakfast ready and was getting ready to walk into the other room and lost her balance and fell.  She reports that she was falling she tried to grab the handle on the stove but of course that pulled open and she fell to the floor.  She reports she did not think she was too badly injured and was able to get up and walk some.  However, she has gotten a lot more pain now in her left foot and knee.  It is now getting more difficult to walk and bear any weight on it.  She reports that she landed right on her buttocks.  She denies however back pain headache or neck pain.  She has been at normal baseline mental status.  Patient does take Eliquis.    Past Medical History:  Diagnosis Date   Anemia    CAD 09/24/2008   AMI in 1998 tx with PCI to LAD;  myoview 1/12:  no ischemia, EF 45%   CARDIOMYOPATHY 09/24/2008   ischemic;  echo 4/12:  EF 20-25%, mild MR, mod LAE, mod reduced RVSF, mod RVE, mild RAE, mild TR, PASP 40   CAROTID BRUIT 09/24/2008   DIABETES MELLITUS 09/24/2008   Edema 09/24/2008   GERD (gastroesophageal reflux disease)    HYPERLIPIDEMIA 09/24/2008   HYPERTENSION 09/24/2008   MI 09/24/2008   Osteopenia    Persistent atrial fibrillation ([place]) 09/24/2008   s/p DCCV x 4 in past;  Amiod d/c'd 2/2 abnormal PFTs;  Tikosyn load 6/12 with DCCV   Tachycardia-bradycardia [facility])     Patient Active Problem List   Diagnosis Date Noted   Gout 01/05/2019   Atypical atrial flutter ([place]) 01/17/2015   Tachycardia-bradycardia ([place]) 09/15/2014   Encounter for therapeutic drug monitoring 09/02/2013   OSA on CPAP 03/23/2013   Obesity (BMI 30-39.9) 78/29/5621   Chronic systolic heart failure ([place]) 11/02/2012    Dizziness 11/02/2012   A-fib ([place]) 08/10/2011   Osteopenia    Long term (current) use of anticoagulants 09/21/2010   DIABETES MELLITUS 09/24/2008   HYPERCHOLESTEROLEMIA 09/24/2008   Hyperlipidemia 09/24/2008   Hypertensive cardiovascular disease 09/24/2008   MI 09/24/2008   Coronary atherosclerosis 09/24/2008   CARDIOMYOPATHY 09/24/2008   PERSISTENT ATRIAL FIBRILLATION 09/24/2008   EDEMA 09/24/2008   CAROTID BRUIT 09/24/2008    Past Surgical History:  Procedure Laterality Date   CARDIOVERSION N/A 02/03/2015   Procedure: CARDIOVERSION;  Surgeon: Dorothy Spark, MD;  Location: [facility];  Service: Cardiovascular;  Laterality: N/A;   CARDIOVERSION N/A 07/08/2018   Procedure: CARDIOVERSION;  Surgeon: Thayer Headings, MD;  Location: [facility];  Service: Cardiovascular;  Laterality: N/A;  getting INR prior   ELECTROPHYSIOLOGIC STUDY N/A 10/15/2014   PVI and CTI ablation by Dr Rayann Heman   HIP FRACTURE SURGERY  2006   KNEE SURGERY  2008   broken knee   LOOP RECORDER IMPLANT N/A 11/06/2012   Procedure: LOOP RECORDER IMPLANT;  Surgeon: Thompson Grayer, MD;  Location: [facility] CATH LAB;  Service: Cardiovascular;  Laterality: N/A;Medtronic LinQ implanted by Dr Rayann Heman for palpitaitons and dizziness   TEE WITHOUT CARDIOVERSION N/A 10/15/2014   Procedure: TRANSESOPHAGEAL ECHOCARDIOGRAM (TEE);  Surgeon: Arnette Norris  Deboraha Sprang, MD;  Location: [place] ENDOSCOPY;  Service: Cardiovascular;  Laterality: N/A;     OB History   No obstetric history on file.     Family History  Problem Relation Age of Onset   Breast cancer Mother    Heart disease Mother    Diabetes Mother    Alcohol abuse Father    Breast cancer Sister    Brain cancer Sister        cause of death at 74 years old   Alcohol abuse Brother    Heart disease Brother    Heart disease Brother    Cancer Brother        unknown type   Coronary artery disease Other    Hypertension Other    Heart attack Brother    Heart attack Brother     Social  History   Tobacco Use   Smoking status: Former    Packs/day: 0.50    Years: 20.00    Pack years: 10.00    Types: Cigarettes    Quit date: 07/11/1996    Years since quitting: 24.2    Passive exposure: Past   Smokeless tobacco: Never  Substance Use Topics   Alcohol use: No   Drug use: No    Home Medications Prior to Admission medications   Medication Sig Start Date End Date Taking? Authorizing Provider  HYDROcodone-acetaminophen (NORCO/VICODIN) 5-325 MG tablet Take 1 tablet by mouth every 4 (four) hours as needed for moderate pain or severe pain. 10/03/20  Yes Charlesetta Shanks, MD  acetaminophen (TYLENOL) 325 MG tablet Take 325 mg by mouth 2 (two) times daily as needed for moderate pain or headache.    [provider]  apixaban (ELIQUIS) 5 MG TABS tablet Take 1 tablet (5 mg total) by mouth 2 (two) times daily. 11/23/19   Shirley Friar, PA-C  aspirin 81 MG EC tablet Take 81 mg by mouth every evening.    [provider]  atorvastatin (LIPITOR) 10 MG tablet Take 0.5 tablets (5 mg total) by mouth daily after supper. 08/08/20   Allred, Jeneen Rinks, MD  Blood Glucose Monitoring Suppl (ONE TOUCH ULTRA 2) w/Device KIT Use as directed. 09/06/20   [name], Alinda Sierras, MD  Calcium Carb-Cholecalciferol (CALCIUM 600+D3 PO) Take 1 tablet by mouth daily.    [provider]  furosemide (LASIX) 40 MG tablet Take 1.5 tablets (60 mg total) by mouth daily. Patient taking differently: Take 40 mg by mouth daily.  09/08/19 12/07/19  Shirley Friar, PA-C  glucose blood test strip 1 each by Other route as needed for other. Use as instructed with One Touch glucometer. Patient not taking: Reported on 07/27/2020 09/30/15   Eulas Post, MD  Magnesium Oxide 400 MG CAPS Take 1 capsule (400 mg total) by mouth daily. 03/20/17   Isaiah Serge, NP  metoprolol tartrate (LOPRESSOR) 100 MG tablet Take 0.5 tablets (50 mg total) by mouth 2 (two) times daily. 03/21/20   Allred, Jeneen Rinks, MD   Multiple Vitamin (MULTIVITAMIN WITH MINERALS) TABS tablet Take 1 tablet by mouth daily. One-A-Day    [provider]  ramipril (ALTACE) 10 MG capsule Take 2 capsules (20 mg total) by mouth daily. 06/14/20   Allred, Jeneen Rinks, MD  vitamin B-12 (CYANOCOBALAMIN) 500 MCG tablet Take 500 mcg by mouth daily.    [provider]  vitamin E 400 UNIT capsule Take 400 Units by mouth daily.    [provider]    Allergies    Simvastatin  Review of Systems   Review of Systems 10 systems reviewed and negative except as per HPI Physical Exam Updated Vital Signs BP (!) 181/117   Pulse 89   Temp 98.6 F (37 C) (Oral)   Resp 15   Ht $R'5\' 6"'HT$  (1.676 m)   Wt 97.5 kg   SpO2 92%   BMI 34.70 kg/m   Physical Exam Constitutional:      Appearance: Normal appearance.     Comments: Patient is alert and nontoxic.  She has clear mental status.  No respiratory distress.  HENT:     Head: Normocephalic and atraumatic.     Mouth/Throat:     Pharynx: Oropharynx is clear.  Eyes:     Extraocular Movements: Extraocular movements intact.  Cardiovascular:     Comments: Heart is irregularly irregular. Pulmonary:     Effort: Pulmonary effort is normal.     Breath sounds: Normal breath sounds.  Abdominal:     General: There is no distension.     Palpations: Abdomen is soft.     Tenderness: There is no abdominal tenderness. There is no guarding.  Musculoskeletal:     Comments: Back normal visual inspection without contusions or abrasions.  No bony point tenderness of the cervical spine thoracic spine or lumbar spine.  Patient does have a approximately 5 cm ecchymotic hematoma developing at the medial left knee.  No joint effusion.  He does have some pain with range of motion but range of motion intact.  Left foot has mild to moderate swelling.  There is ecchymosis developing in the midfoot.  Dorsalis pedis pulse 2+ and strong.  The foot is warm and dry.  Patient can move her toes and ankle.   Skin:    General: Skin is warm and dry.  Neurological:     General: No focal deficit present.     Mental Status: She is alert and oriented to person, place, and time.     Motor: No weakness.     Coordination: Coordination normal.  Psychiatric:        Mood and Affect: Mood normal.    ED Results / Procedures / Treatments   Labs (all labs ordered are listed, but only abnormal results are displayed) Labs Reviewed - No data to display  EKG None  Radiology DG Tibia/Fibula Left  Result Date: 10/03/2020 CLINICAL DATA:  Recent fall with medial knee pain, initial encounter EXAM: LEFT TIBIA AND FIBULA - 2 VIEW COMPARISON:  12/27/2018 FINDINGS: Fixation sideplate is noted along the distal left femur laterally with multiple fixation screws identified. The overall appearance is similar to that seen on the prior exam. Medial joint space narrowing is noted also stable from the prior study. Tarsal degenerative changes are seen. At the edge of the film there are changes consistent with fractures involving the second third and fourth metatarsals. This is incompletely evaluated on this exam. Dedicated foot films may be helpful. IMPRESSION: Fractures of the second through fourth metatarsals incompletely evaluated on this exam. Dedicated foot films may be helpful. No other acute fracture is seen. Postsurgical changes in the distal femur with degenerative changes in the knee joint. Electronically Signed   By: Inez Catalina M.D.   On: 10/03/2020 16:01   DG Foot Complete Left  Result Date: 10/03/2020 CLINICAL DATA:  Pain following fall EXAM: LEFT FOOT - COMPLETE 3+ VIEW COMPARISON:  None. FINDINGS: Frontal, oblique, and lateral views were obtained. There is a nondisplaced fracture of the mid second metatarsal. There is  also a nondisplaced fracture of the proximal aspect of the second metatarsal. There is a comminuted fracture through the midportion of the third metatarsal with lateral displacement distally. There  is a comminuted fracture involving the proximal to mid aspects of the fourth metatarsal with a nondisplaced fracture more proximally in the fourth metatarsal. Bones are osteoporotic. There is hallux valgus deformity at the first MTP joint. There is narrowing of all MTP, PIP, and D IP joints. No erosive changes. IMPRESSION: Fractures of the proximal and mid aspects of the second metatarsal with alignment near anatomic in these areas. Displaced fracture mid third metatarsal. Comminuted fracture proximal to mid fourth metatarsal with displaced fracture fragments as well as a nondisplaced fracture more proximally in the fourth metatarsal. Multifocal osteoarthritic change distally. Hallux valgus deformity first MTP joint. Bones diffusely osteoporotic. Electronically Signed   By: Lowella Grip III M.D.   On: 10/03/2020 16:39    Procedures Procedures   Medications Ordered in ED Medications  acetaminophen (TYLENOL) tablet 1,000 mg (has no administration in time range)    ED Course  I have reviewed the triage vital signs and the nursing notes.  Pertinent labs & imaging results that were available during my care of the patient were reviewed by me and considered in my medical decision making (see chart for details).    MDM Rules/Calculators/A&P                          Patient had a fall earlier in the day.  She was able to ambulate initially but then started developing increasing pain in the left foot and knee.  Xray show multiple metatarsal fractures.  Range of motion of the knee is intact although uncomfortable with developing hematoma. At this time patient is otherwise stable and mental status is clear.  I discussed social work referral with the patient's daughter.  She is concerned for need for additional help at home.  She is instructed to call her PCP first thing in the morning to discuss additional arrangements with social work.  A referral guide for social services including the discharge  instructions and a consult has been placed. Final Clinical Impression(s) / ED Diagnoses Final diagnoses:  Multiple closed fractures of foot, initial encounter  Fall, initial encounter  Contusion of left knee, initial encounter  Anticoagulated    Rx / DC Orders ED Discharge Orders          Ordered    HYDROcodone-acetaminophen (NORCO/VICODIN) 5-325 MG tablet  Every 4 hours PRN        10/03/20 1907             Charlesetta Shanks, MD 10/03/20 [phone]

## 2020-10-03 NOTE — Discharge Instructions (Addendum)
1.  It will be very important that you do a lot of elevating and icing to your foot and knee.  We will get a lot of swelling due to being on Eliquis.  Try to elevate your lower leg above the level of your heart when you are in a recliner or in the bed.  Use pillows to prop it up.  Apply well wrapped ice packs for at least 20 minutes every 2 hours on the foot and the knee. 2.  You may take acetaminophen for pain.  May take extra strength Tylenol.  You have been given a prescription for Vicodin for additional pain control.  If you take 1 Vicodin tablet, it has the equivalent of 1 325 mg acetaminophen.  You may combine 1 Vicodin tablet with 1 acetaminophen tablet. 3.  Call your family doctor first thing tomorrow to help establish in-home care.  A consult has been placed to social work.  I anticipate they will give you a call at home.  Also you have been given a resource guide for local resources for additional community assistance. 4.  Make a follow-up appointment with the orthopedic doctor listed above in your discharge instructions for recheck of your foot and knee within the next 3 to 5 days.

## 2020-10-03 NOTE — ED Triage Notes (Signed)
Patient reports to the ER BIB EMS from home.  Patient had a mechanical fall this morning and then another fall this afternoon and reports trouble with standing. Patient reports she landed on her back and hurt her left foot and knee Patient reports she is on eliquis.

## 2020-10-04 ENCOUNTER — Ambulatory Visit: Payer: PPO | Admitting: Family Medicine

## 2020-10-04 NOTE — Progress Notes (Addendum)
10/04/2020 541 pm    .Transition of Care [facility]) - Emergency Department Mini Assessment   Patient Details  Name: [name] MRN: [id number] Date of Birth: 1929/11/06  Transition [facility]) CM/SW Contact:    Elliot Cousin, RN Phone Number: [phone] 10/04/2020, 5:37 PM   Clinical Narrative: Referral to arrange [facility]. TOC CM spoke to pt via phone. States she lives alone and her grandchildren come to assist when she calls. She is needing HH PT, OT and aide. Message sent to ED provider and PCP for [facility]. States she had [facility] in the past. She has RW, wheelchair, and bedside commode at home. Sent message to [facility] rep, Pearson Grippe to review referral. Waiting HH orders. Will follow up with pt on tomorrow. Explained the importance of scheduling appt with her PCP and ortho surgeon as soon as possible.    ED Mini Assessment: What brought you to the Emergency Department? : fall  Barriers to Discharge: No Barriers Identified  Barrier interventions: contacted provider and PCP for [facility] orders  Means of departure: Car  Interventions which prevented an admission or readmission: Home Health Consult or Services    Patient Contact and Communications        ,            CMS Medicare.gov Compare Post Acute Care list provided to:: Patient Choice offered to / list presented to : Patient  Admission diagnosis:  Fall; Left leg pain Patient Active Problem List   Diagnosis Date Noted   Gout 01/05/2019   Atypical atrial flutter ([facility]) 01/17/2015   Tachycardia-bradycardia ([facility]) 09/15/2014   Encounter for therapeutic drug monitoring 09/02/2013   OSA on CPAP 03/23/2013   Obesity (BMI 30-39.9) 03/23/2013   Chronic systolic heart failure ([facility]) 11/02/2012   Dizziness 11/02/2012   A-fib ([facility]) 08/10/2011   Osteopenia    Long term (current) use of anticoagulants 09/21/2010   DIABETES MELLITUS 09/24/2008   HYPERCHOLESTEROLEMIA 09/24/2008   Hyperlipidemia 09/24/2008   Hypertensive  cardiovascular disease 09/24/2008   MI 09/24/2008   Coronary atherosclerosis 09/24/2008   CARDIOMYOPATHY 09/24/2008   PERSISTENT ATRIAL FIBRILLATION 09/24/2008   EDEMA 09/24/2008   CAROTID BRUIT 09/24/2008   PCP:  [name], MD Pharmacy:   [facility] [address], [place] - [address] [place] [address] Phone: [phone] Fax: [phone]  [facility]) - [place], [place] - [address] [place] [address] [place] [address] Phone: [phone] Fax: [phone]  [facility] [address] [place], [place] [address] [address] [place] [address] Phone: [phone] Fax: [phone]  [id number] - [place], [place] - [address] AT [facility] [address] [place] [address] Phone: [phone] Fax: [phone]

## 2020-10-07 DIAGNOSIS — M79672 Pain in left foot: Secondary | ICD-10-CM | POA: Diagnosis not present

## 2020-10-12 DIAGNOSIS — I2510 Atherosclerotic heart disease of native coronary artery without angina pectoris: Secondary | ICD-10-CM | POA: Diagnosis not present

## 2020-10-12 DIAGNOSIS — S92345D Nondisplaced fracture of fourth metatarsal bone, left foot, subsequent encounter for fracture with routine healing: Secondary | ICD-10-CM | POA: Diagnosis not present

## 2020-10-12 DIAGNOSIS — I110 Hypertensive heart disease with heart failure: Secondary | ICD-10-CM | POA: Diagnosis not present

## 2020-10-12 DIAGNOSIS — E7800 Pure hypercholesterolemia, unspecified: Secondary | ICD-10-CM | POA: Diagnosis not present

## 2020-10-12 DIAGNOSIS — I252 Old myocardial infarction: Secondary | ICD-10-CM | POA: Diagnosis not present

## 2020-10-12 DIAGNOSIS — M109 Gout, unspecified: Secondary | ICD-10-CM | POA: Diagnosis not present

## 2020-10-12 DIAGNOSIS — R42 Dizziness and giddiness: Secondary | ICD-10-CM | POA: Diagnosis not present

## 2020-10-12 DIAGNOSIS — G4733 Obstructive sleep apnea (adult) (pediatric): Secondary | ICD-10-CM | POA: Diagnosis not present

## 2020-10-12 DIAGNOSIS — I5022 Chronic systolic (congestive) heart failure: Secondary | ICD-10-CM | POA: Diagnosis not present

## 2020-10-12 DIAGNOSIS — M8580 Other specified disorders of bone density and structure, unspecified site: Secondary | ICD-10-CM | POA: Diagnosis not present

## 2020-10-12 DIAGNOSIS — I484 Atypical atrial flutter: Secondary | ICD-10-CM | POA: Diagnosis not present

## 2020-10-12 DIAGNOSIS — E119 Type 2 diabetes mellitus without complications: Secondary | ICD-10-CM | POA: Diagnosis not present

## 2020-10-12 DIAGNOSIS — I428 Other cardiomyopathies: Secondary | ICD-10-CM | POA: Diagnosis not present

## 2020-10-14 ENCOUNTER — Other Ambulatory Visit: Payer: Self-pay

## 2020-10-14 MED ORDER — METOPROLOL TARTRATE 100 MG PO TABS
50.0000 mg | ORAL_TABLET | Freq: Two times a day (BID) | ORAL | 2 refills | Status: DC
Start: 2020-10-14 — End: 2021-07-20

## 2020-10-19 ENCOUNTER — Other Ambulatory Visit: Payer: Self-pay | Admitting: *Deleted

## 2020-10-19 DIAGNOSIS — I4821 Permanent atrial fibrillation: Secondary | ICD-10-CM

## 2020-10-19 MED ORDER — APIXABAN 5 MG PO TABS: 5.0000 mg | ORAL_TABLET | Freq: Two times a day (BID) | ORAL | 2 refills | Status: DC

## 2020-10-19 NOTE — Telephone Encounter (Signed)
Eliquis 5mg  paper refill request received. Patient is 87 years old, weight-97.5kg, Crea-0.83 on 07/19/2020, Diagnosis-Afib, and last seen by [facility] on 07/19/2020. Dose is appropriate based on dosing criteria. Will send in refill to requested pharmacy.

## 2020-10-21 DIAGNOSIS — I428 Other cardiomyopathies: Secondary | ICD-10-CM | POA: Diagnosis not present

## 2020-10-21 DIAGNOSIS — I110 Hypertensive heart disease with heart failure: Secondary | ICD-10-CM | POA: Diagnosis not present

## 2020-10-21 DIAGNOSIS — I252 Old myocardial infarction: Secondary | ICD-10-CM | POA: Diagnosis not present

## 2020-10-21 DIAGNOSIS — M8580 Other specified disorders of bone density and structure, unspecified site: Secondary | ICD-10-CM | POA: Diagnosis not present

## 2020-10-21 DIAGNOSIS — S92345D Nondisplaced fracture of fourth metatarsal bone, left foot, subsequent encounter for fracture with routine healing: Secondary | ICD-10-CM | POA: Diagnosis not present

## 2020-10-21 DIAGNOSIS — I5022 Chronic systolic (congestive) heart failure: Secondary | ICD-10-CM | POA: Diagnosis not present

## 2020-10-21 DIAGNOSIS — I484 Atypical atrial flutter: Secondary | ICD-10-CM | POA: Diagnosis not present

## 2020-10-21 DIAGNOSIS — E7800 Pure hypercholesterolemia, unspecified: Secondary | ICD-10-CM | POA: Diagnosis not present

## 2020-10-21 DIAGNOSIS — R42 Dizziness and giddiness: Secondary | ICD-10-CM | POA: Diagnosis not present

## 2020-10-21 DIAGNOSIS — G4733 Obstructive sleep apnea (adult) (pediatric): Secondary | ICD-10-CM | POA: Diagnosis not present

## 2020-10-21 DIAGNOSIS — E119 Type 2 diabetes mellitus without complications: Secondary | ICD-10-CM | POA: Diagnosis not present

## 2020-10-21 DIAGNOSIS — I2510 Atherosclerotic heart disease of native coronary artery without angina pectoris: Secondary | ICD-10-CM | POA: Diagnosis not present

## 2020-10-21 DIAGNOSIS — M109 Gout, unspecified: Secondary | ICD-10-CM | POA: Diagnosis not present

## 2020-10-25 DIAGNOSIS — S92325A Nondisplaced fracture of second metatarsal bone, left foot, initial encounter for closed fracture: Secondary | ICD-10-CM | POA: Diagnosis not present

## 2020-10-25 DIAGNOSIS — S92345A Nondisplaced fracture of fourth metatarsal bone, left foot, initial encounter for closed fracture: Secondary | ICD-10-CM | POA: Diagnosis not present

## 2020-11-25 DIAGNOSIS — S92325S Nondisplaced fracture of second metatarsal bone, left foot, sequela: Secondary | ICD-10-CM | POA: Diagnosis not present

## 2020-11-25 DIAGNOSIS — S92345S Nondisplaced fracture of fourth metatarsal bone, left foot, sequela: Secondary | ICD-10-CM | POA: Diagnosis not present

## 2020-12-15 ENCOUNTER — Other Ambulatory Visit: Payer: Self-pay | Admitting: *Deleted

## 2020-12-15 MED ORDER — RAMIPRIL 10 MG PO CAPS: 20.0000 mg | ORAL_CAPSULE | Freq: Every day | ORAL | 1 refills | Status: DC

## 2021-01-02 ENCOUNTER — Other Ambulatory Visit: Payer: Self-pay

## 2021-01-03 ENCOUNTER — Ambulatory Visit (INDEPENDENT_AMBULATORY_CARE_PROVIDER_SITE_OTHER): Payer: PPO | Admitting: Family Medicine

## 2021-01-03 ENCOUNTER — Telehealth: Payer: Self-pay | Admitting: Pharmacist

## 2021-01-03 VITALS — BP 120/70 | HR 61 | Temp 98.0°F

## 2021-01-03 DIAGNOSIS — E119 Type 2 diabetes mellitus without complications: Secondary | ICD-10-CM

## 2021-01-03 DIAGNOSIS — I2510 Atherosclerotic heart disease of native coronary artery without angina pectoris: Secondary | ICD-10-CM | POA: Diagnosis not present

## 2021-01-03 DIAGNOSIS — E785 Hyperlipidemia, unspecified: Secondary | ICD-10-CM

## 2021-01-03 DIAGNOSIS — Z23 Encounter for immunization: Secondary | ICD-10-CM | POA: Diagnosis not present

## 2021-01-03 DIAGNOSIS — M26622 Arthralgia of left temporomandibular joint: Secondary | ICD-10-CM | POA: Diagnosis not present

## 2021-01-03 DIAGNOSIS — H60542 Acute eczematoid otitis externa, left ear: Secondary | ICD-10-CM

## 2021-01-03 DIAGNOSIS — F339 Major depressive disorder, recurrent, unspecified: Secondary | ICD-10-CM

## 2021-01-03 LAB — POCT GLYCOSYLATED HEMOGLOBIN (HGB A1C): Hemoglobin A1C: 6.5 % — AB (ref 4.0–5.6)

## 2021-01-03 LAB — LIPID PANEL
Cholesterol: 142 mg/dL (ref 0–200)
HDL: 38.2 mg/dL — ABNORMAL LOW (ref >39.00)
LDL Cholesterol: 73 mg/dL (ref 0–99)
NonHDL: 103.68
Total CHOL/HDL Ratio: 4
Triglycerides: 152 mg/dL — ABNORMAL HIGH (ref 0.0–149.0)
VLDL: 30.4 mg/dL (ref 0.0–40.0)

## 2021-01-03 LAB — HEPATIC FUNCTION PANEL
ALT: 10 U/L (ref 0–35)
AST: 13 U/L (ref 0–37)
Albumin: 3.7 g/dL (ref 3.5–5.2)
Alkaline Phosphatase: 68 U/L (ref 39–117)
Bilirubin, Direct: 0.2 mg/dL (ref 0.0–0.3)
Total Bilirubin: 1.2 mg/dL (ref 0.2–1.2)
Total Protein: 7.1 g/dL (ref 6.0–8.3)

## 2021-01-03 MED ORDER — MOMETASONE FUROATE 0.1 % EX SOLN: Freq: Every day | CUTANEOUS | 1 refills | Status: AC

## 2021-01-03 MED ORDER — SERTRALINE HCL 50 MG PO TABS
50.0000 mg | ORAL_TABLET | Freq: Every day | ORAL | 3 refills | Status: DC
Start: 2021-01-03 — End: 2022-01-02

## 2021-01-03 NOTE — Addendum Note (Signed)
Addended by: Solon Augusta on: 01/03/2021 09:39 AM   Modules accepted: Orders

## 2021-01-03 NOTE — Chronic Care Management (AMB) (Signed)
Chronic Care Management Pharmacy Assistant   Name: [name]  MRN: [id number] DOB: Nov 05, 1929   Reason for Encounter: Disease State/ General Assessment Call.   Conditions to be addressed/monitored: CHF, HTN, DMII, and A-FIB   Recent office visits:  01/03/21 Carolann Littler MD (PCP) - seen for type 2 diabetes and other issues. Patient started on mometasone 0.1% topically daily and sertraline 40m daily. Follow up in 6 months.  Recent consult visits:  10/25/20 JVictorino December(Orthopedic Surgery) - seen for fracture of metatarsal bone. No medication changes or follow up noted.   Hospital visits:  Medication Reconciliation was completed by comparing discharge summary, patient's EMR and Pharmacy list, and upon discussion with patient.  [facility] 4 hours due to Multiple closed fractures of foot.   [facility] -[facility] hydrocodone 5-3225mfor every 4 hours as needed.   Medication Changes at Hospital Discharge: -Changed None  Medications Discontinued at Hospital Discharge: -Stopped None.  Medications that remain the same after Hospital Discharge:??  -All other medications will remain the same.    Medications: Outpatient Encounter Medications as of 01/03/2021  Medication Sig   acetaminophen (TYLENOL) 325 MG tablet Take 325 mg by mouth 2 (two) times daily as needed for moderate pain or headache.   apixaban (ELIQUIS) 5 MG TABS tablet Take 1 tablet (5 mg total) by mouth 2 (two) times daily.   aspirin 81 MG EC tablet Take 81 mg by mouth every evening.   atorvastatin (LIPITOR) 10 MG tablet Take 0.5 tablets (5 mg total) by mouth daily after supper.   Blood Glucose Monitoring Suppl (ONE TOUCH ULTRA 2) w/Device KIT Use as directed.   Calcium Carb-Cholecalciferol (CALCIUM 600+D3 PO) Take 1 tablet by mouth daily.   furosemide (LASIX) 40 MG tablet Take 1.5 tablets (60 mg total) by mouth  daily. (Patient taking differently: Take 40 mg by mouth daily. )   glucose blood test strip 1 each by Other route as needed for other. Use as instructed with One Touch glucometer.   HYDROcodone-acetaminophen (NORCO/VICODIN) 5-325 MG tablet Take 1 tablet by mouth every 4 (four) hours as needed for moderate pain or severe pain.   Magnesium Oxide 400 MG CAPS Take 1 capsule (400 mg total) by mouth daily.   metoprolol tartrate (LOPRESSOR) 100 MG tablet Take 0.5 tablets (50 mg total) by mouth 2 (two) times daily.   mometasone (ELOCON) 0.1 % lotion Apply topically daily.   Multiple Vitamin (MULTIVITAMIN WITH MINERALS) TABS tablet Take 1 tablet by mouth daily. One-A-Day   ramipril (ALTACE) 10 MG capsule Take 2 capsules (20 mg total) by mouth daily.   sertraline (ZOLOFT) 50 MG tablet Take 1 tablet (50 mg total) by mouth daily.   vitamin B-12 (CYANOCOBALAMIN) 500 MCG tablet Take 500 mcg by mouth daily.   vitamin E 400 UNIT capsule Take 400 Units by mouth daily.   No facility-administered encounter medications on file as of 01/03/2021.   Fill History: ELIQUIS 5MG TABLET 12/29/2020 90   ATORVASTATIN CALCIUM 10MG TABLET 12/25/2020 90   ONETOUCH ULTRA 2   KIT 09/06/2020 1   METOPROLOL TARTRATE 100MG TABLET 12/29/2020 90   RAMIPRIL 10MG CAPSULE 12/19/2020 90   Reviewed chart prior to disease state call. Spoke with patient regarding BP  Recent Office Vitals: BP Readings from Last 3 Encounters:  01/03/21 120/70  10/03/20 (!) 175/101  07/19/20 126/78   Pulse Readings from Last 3 Encounters:  01/03/21 61  10/03/20 (!) 53  07/19/20 86    Wt Readings from Last 3 Encounters:  10/03/20 215 lb (97.5 kg)  07/19/20 200 lb (90.7 kg)  01/08/20 196 lb (88.9 kg)     Kidney Function Lab Results  Component Value Date/Time   CREATININE 0.83 07/19/2020 11:15 AM   CREATININE 0.86 09/08/2019 01:00 PM   CREATININE 0.57 (L) 03/22/2017 04:44 PM   CREATININE 0.76 01/23/2016 01:11 PM   GFR 66.77 06/24/2019  03:55 PM   GFRNONAA 61 09/08/2019 01:00 PM   GFRAA 70 09/08/2019 01:00 PM    BMP Latest Ref Rng & Units 07/19/2020 09/08/2019 08/27/2019  Glucose 65 - 99 mg/dL 85 103(H) 197(H)  BUN 8 - 27 mg/dL 25 31(H) 14  Creatinine 0.57 - 1.00 mg/dL 0.83 0.86 0.69  BUN/Creat Ratio 12 - 28 30(H) 36(H) 20  Sodium 134 - 144 mmol/L 140 145(H) 141  Potassium 3.5 - 5.2 mmol/L 4.5 5.0 4.7  Chloride 96 - 106 mmol/L 98 102 101  CO2 20 - 29 mmol/L _0 Calcium 8.7 - 10.3 mg/dL 10.1 10.0 9.4    Current antihypertensive regimen:  Metoprolol 163m - take 0.5 tablet twice daily.  Ramipril 157m- take 2 capsules by mouth everyday.  How often are you checking your Blood Pressure? Patient has not checked blood pressure at home. Current home BP readings: no readings to report. What recent interventions/DTPs have been made by any provider to improve Blood Pressure control since last CPP Visit: None. Any recent hospitalizations or ED visits since last visit with CPP? Yes  Adherence Review: Is the patient currently on ACE/ARB medication? Yes Does the patient have >5 day gap between last estimated fill dates? No   Recent Relevant Labs: Lab Results  Component Value Date/Time   HGBA1C 6.5 (A) 01/03/2021 08:48 AM   HGBA1C 6.5 (H) 07/19/2020 11:15 AM   HGBA1C 6.3 (A) 01/08/2020 12:16 PM   HGBA1C 6.6 (H) 07/14/2019 08:54 AM    Kidney Function Lab Results  Component Value Date/Time   CREATININE 0.83 07/19/2020 11:15 AM   CREATININE 0.86 09/08/2019 01:00 PM   CREATININE 0.57 (L) 03/22/2017 04:44 PM   CREATININE 0.76 01/23/2016 01:11 PM   GFR 66.77 06/24/2019 03:55 PM   GFRNONAA 61 09/08/2019 01:00 PM   GFRAA 70 09/08/2019 01:00 PM    Current antihyperglycemic regimen:  None at this time.  What recent interventions/DTPs have been made to improve glycemic control:  None. Have there been any recent hospitalizations or ED visits since last visit with CPP? Yes Patient denies hypoglycemic symptoms,  including None Patient denies hyperglycemic symptoms, including none How often are you checking your blood sugar? Patient has not been checking her blood sugar. What are your blood sugars ranging? None to report. During the week, how often does your blood glucose drop below 70?  Not at all to patients knowledge. Are you checking your feet daily/regularly? Patient is unable to get to her feet. Does not check them daily.  Adherence Review: Is the patient currently on a STATIN medication? Yes Is the patient currently on ACE/ARB medication? Yes Does the patient have >5 day gap between last estimated fill dates? No  Notes: Spoke with patient and reviewed medications as prescribed. Patient reports taking all medications except for the hydrocodone. Patient reports no issues at this time. Patient stated she just started getting meals on wheels. Patient has a bowl of cereal with fruit in the mornings. For lunch she has maybe some soup and  she has a good dinner every night. She eats whatever comes in her pre packaged meals on wheels. She does not add salt to any of it though. Patient drinks water all day. She is not able to do much activity but she can walk through her home with her walker. Patient scheduled an overdue follow up with Jeni Salles, Pharm D.  Care Gaps:  AWV - scheduled for 08/02/21 Covid-19 vaccine - never done Foot exam - never done Ophthalmology exam - never done Tetanus/tdap - never done Zoster vaccines - never done Pneumonia vaccine - overdue since 2016   Star Rating Drugs:  Atorvastatin 16m - last filled on 12/25/20 90DS at [facility] Ramipril 140m- [phone]

## 2021-01-03 NOTE — Progress Notes (Signed)
Established Patient Office Visit  Subjective:  Patient ID: [name], female    DOB: Jul 24, 1929  Age: 87 y.o. MRN: [id number]  CC:  Chief Complaint  Patient presents with   Ear Pain    Left ear, x 3 months, pain comes and goes, radiates down into the jaw    HPI [name] presents for several issues as follows  Intermittent left ear pain for 3 months.  This is painful frequently when she opens and closes her jaw.  Also has had some itching in the canal.  No drainage.  She has some chronic bilateral hearing loss which is unchanged.  Denies any drainage from the ear.  No fever.  No vertigo.  History of recurrent depression.  She has been on sertraline in the past and that worked well for her.  She is requesting refills.  She ran out months ago.  No suicidal ideation  She has history of CAD and is on statin.  She also has history of atrial fibrillation and remains on Eliquis and metoprolol.  She had recent CBC and basic metabolic panel per cardiology.  She has history of diabetes which is managed by diet.  Last A1c 6.5%.  Past Medical History:  Diagnosis Date   Anemia    CAD 09/24/2008   AMI in 1998 tx with PCI to LAD;  myoview 1/12:  no ischemia, EF 45%   CARDIOMYOPATHY 09/24/2008   ischemic;  echo 4/12:  EF 20-25%, mild MR, mod LAE, mod reduced RVSF, mod RVE, mild RAE, mild TR, PASP 40   CAROTID BRUIT 09/24/2008   DIABETES MELLITUS 09/24/2008   Edema 09/24/2008   GERD (gastroesophageal reflux disease)    HYPERLIPIDEMIA 09/24/2008   HYPERTENSION 09/24/2008   MI 09/24/2008   Osteopenia    Persistent atrial fibrillation ([place]) 09/24/2008   s/p DCCV x 4 in past;  Amiod d/c'd 2/2 abnormal PFTs;  Tikosyn load 6/12 with DCCV   Tachycardia-bradycardia [facility])     Past Surgical History:  Procedure Laterality Date   CARDIOVERSION N/A 02/03/2015   Procedure: CARDIOVERSION;  Surgeon: Dorothy Spark, MD;  Location: [place];  Service: Cardiovascular;  Laterality: N/A;   CARDIOVERSION N/A  07/08/2018   Procedure: CARDIOVERSION;  Surgeon: Thayer Headings, MD;  Location: [place];  Service: Cardiovascular;  Laterality: N/A;  getting INR prior   ELECTROPHYSIOLOGIC STUDY N/A 10/15/2014   PVI and CTI ablation by Dr Rayann Heman   HIP FRACTURE SURGERY  2006   KNEE SURGERY  2008   broken knee   LOOP RECORDER IMPLANT N/A 11/06/2012   Procedure: LOOP RECORDER IMPLANT;  Surgeon: Thompson Grayer, MD;  Location: [facility] CATH LAB;  Service: Cardiovascular;  Laterality: N/A;Medtronic LinQ implanted by Dr Rayann Heman for palpitaitons and dizziness   TEE WITHOUT CARDIOVERSION N/A 10/15/2014   Procedure: TRANSESOPHAGEAL ECHOCARDIOGRAM (TEE);  Surgeon: Thayer Headings, MD;  Location: [facility] ENDOSCOPY;  Service: Cardiovascular;  Laterality: N/A;    Family History  Problem Relation Age of Onset   Breast cancer Mother    Heart disease Mother    Diabetes Mother    Alcohol abuse Father    Breast cancer Sister    Brain cancer Sister        cause of death at 60 years old   Alcohol abuse Brother    Heart disease Brother    Heart disease Brother    Cancer Brother        unknown type   Coronary artery disease Other  Hypertension Other    Heart attack Brother    Heart attack Brother     Social History   Socioeconomic History   Marital status: Widowed    Spouse name: Not on file   Number of children: Not on file   Years of education: Not on file   Highest education level: Not on file  Occupational History   Not on file  Tobacco Use   Smoking status: Former    Packs/day: 0.50    Years: 20.00    Pack years: 10.00    Types: Cigarettes    Quit date: 07/11/1996    Years since quitting: 24.4    Passive exposure: Past   Smokeless tobacco: Never  Vaping Use   Vaping Use: Not on file  Substance and Sexual Activity   Alcohol use: No   Drug use: No   Sexual activity: Not Currently  Other Topics Concern   Not on file  Social History Narrative   Not on file   Social Determinants of Health    Financial Resource Strain: Low Risk    Difficulty of Paying Living Expenses: Not hard at all  Food Insecurity: No Food Insecurity   Worried About Charity fundraiser in the Last Year: Never true   [place] in the Last Year: Never true  Transportation Needs: No Transportation Needs   Lack of Transportation (Medical): No   Lack of Transportation (Non-Medical): No  Physical Activity: Inactive   Days of Exercise per Week: 0 days   Minutes of Exercise per Session: 0 min  Stress: No Stress Concern Present   Feeling of Stress : Not at all  Social Connections: Moderately Isolated   Frequency of Communication with Friends and Family: More than three times a week   Frequency of Social Gatherings with Friends and Family: Once a week   Attends Religious Services: 1 to 4 times per year   Active Member of [employer] or Organizations: No   Attends Archivist Meetings: Never   Marital Status: Widowed  Human resources officer Violence: Not At Risk   Fear of Current or Ex-Partner: No   Emotionally Abused: No   Physically Abused: No   Sexually Abused: No    Outpatient Medications Prior to Visit  Medication Sig Dispense Refill   acetaminophen (TYLENOL) 325 MG tablet Take 325 mg by mouth 2 (two) times daily as needed for moderate pain or headache.     apixaban (ELIQUIS) 5 MG TABS tablet Take 1 tablet (5 mg total) by mouth 2 (two) times daily. 180 tablet 2   aspirin 81 MG EC tablet Take 81 mg by mouth every evening.     atorvastatin (LIPITOR) 10 MG tablet Take 0.5 tablets (5 mg total) by mouth daily after supper. 45 tablet 3   Blood Glucose Monitoring Suppl (ONE TOUCH ULTRA 2) w/Device KIT Use as directed. 1 kit 0   Calcium Carb-Cholecalciferol (CALCIUM 600+D3 PO) Take 1 tablet by mouth daily.     glucose blood test strip 1 each by Other route as needed for other. Use as instructed with One Touch glucometer. 100 each 11   HYDROcodone-acetaminophen (NORCO/VICODIN) 5-325 MG tablet Take 1 tablet  by mouth every 4 (four) hours as needed for moderate pain or severe pain. 20 tablet 0   Magnesium Oxide 400 MG CAPS Take 1 capsule (400 mg total) by mouth daily. 90 capsule 3   metoprolol tartrate (LOPRESSOR) 100 MG tablet Take 0.5 tablets (50 mg total) by  mouth 2 (two) times daily. 90 tablet 2   Multiple Vitamin (MULTIVITAMIN WITH MINERALS) TABS tablet Take 1 tablet by mouth daily. One-A-Day     ramipril (ALTACE) 10 MG capsule Take 2 capsules (20 mg total) by mouth daily. 180 capsule 1   vitamin B-12 (CYANOCOBALAMIN) 500 MCG tablet Take 500 mcg by mouth daily.     vitamin E 400 UNIT capsule Take 400 Units by mouth daily.     furosemide (LASIX) 40 MG tablet Take 1.5 tablets (60 mg total) by mouth daily. (Patient taking differently: Take 40 mg by mouth daily. ) 180 tablet 3   No facility-administered medications prior to visit.    Allergies  Allergen Reactions   Simvastatin Rash    ROS Review of Systems  Constitutional:  Negative for chills, fatigue and fever.  HENT:  Positive for ear pain. Negative for ear discharge and facial swelling.   Eyes:  Negative for visual disturbance.  Respiratory:  Negative for cough, chest tightness, shortness of breath and wheezing.   Cardiovascular:  Negative for chest pain, palpitations and leg swelling.  Endocrine: Negative for polydipsia and polyuria.  Musculoskeletal:  Positive for arthralgias.  Neurological:  Negative for dizziness, seizures, syncope, weakness, light-headedness and headaches.  Psychiatric/Behavioral:  Positive for dysphoric mood.      Objective:    Physical Exam Vitals reviewed.  Constitutional:      Appearance: Normal appearance.  HENT:     Head:     Comments: Significant crepitus and tenderness to palpation of the left TMJ joint.    Mouth/Throat:     Mouth: Mucous membranes are moist.     Pharynx: Oropharynx is clear.  Cardiovascular:     Rate and Rhythm: Normal rate.  Pulmonary:     Effort: Pulmonary effort is  normal.     Breath sounds: Normal breath sounds.  Musculoskeletal:     Cervical back: Neck supple.  Lymphadenopathy:     Cervical: No cervical adenopathy.  Neurological:     General: No focal deficit present.     Mental Status: She is alert.  Psychiatric:        Mood and Affect: Mood normal.        Thought Content: Thought content normal.    BP 120/70 (BP Location: Left Arm, Patient Position: Sitting, Cuff Size: Normal)   Pulse 61   Temp 98 F (36.7 C) (Oral)   SpO2 96%  Wt Readings from Last 3 Encounters:  10/03/20 215 lb (97.5 kg)  07/19/20 200 lb (90.7 kg)  01/08/20 196 lb (88.9 kg)     Health Maintenance Due  Topic Date Due   COVID-19 Vaccine (1) Never done   FOOT EXAM  Never done   OPHTHALMOLOGY EXAM  Never done   TETANUS/TDAP  Never done   Zoster Vaccines- Shingrix (1 of 2) Never done   PNA vac Low Risk Adult (2 of 2 - PPSV23) 02/23/2015   INFLUENZA VACCINE  11/21/2020    There are no preventive care reminders to display for this patient.  Lab Results  Component Value Date   TSH 1.42 06/24/2019   Lab Results  Component Value Date   WBC 9.0 07/19/2020   HGB 14.5 07/19/2020   HCT 43.8 07/19/2020   MCV 92 07/19/2020   PLT 301 07/19/2020   Lab Results  Component Value Date   NA 140 07/19/2020   K 4.5 07/19/2020   CO2 25 07/19/2020   GLUCOSE 85 07/19/2020   BUN 25 07/19/2020  CREATININE 0.83 07/19/2020   BILITOT 0.9 10/09/2018   ALKPHOS 80 10/09/2018   AST 17 10/09/2018   ALT 11 10/09/2018   PROT 7.0 10/09/2018   ALBUMIN 4.3 10/09/2018   CALCIUM 10.1 07/19/2020   ANIONGAP 11 12/27/2018   EGFR 68 07/19/2020   GFR 66.77 06/24/2019   Lab Results  Component Value Date   CHOL 144 10/09/2018   Lab Results  Component Value Date   HDL 40 10/09/2018   Lab Results  Component Value Date   LDLCALC 72 10/09/2018   Lab Results  Component Value Date   TRIG 159 (H) 10/09/2018   Lab Results  Component Value Date   CHOLHDL 3.6 10/09/2018   Lab  Results  Component Value Date   HGBA1C 6.5 (A) 01/03/2021      Assessment & Plan:   #1 history of recurrent depression.  Patient requesting going back on sertraline.  We will start back sertraline 50 mg 1/2 tablet daily for 3 to 4 days then titrate to 50 mg daily.  Refills given  #2 eczema involving the left ear canal greater than right.  Recommend trial of Elocon solution to use a few drops in the left canal once daily as needed  #3 complaints of left otalgia.  Suspect more likely this is related to radiation from the left TMJ.  She has significant crepitus and tenderness over the joint.  We discussed possible referral to oral surgeon but this point she declines.  #4 history of hyperlipidemia.  Patient on chronic statin use.  History of CAD. -Recheck lipid and hepatic panel today  #5 type 2 diabetes stable with A1c today 6.5%.  Continue to monitor at this point and recommend recheck at least every 6 months  #6 health maintenance -Flu vaccine recommended and patient consents   Meds ordered this encounter  Medications   sertraline (ZOLOFT) 50 MG tablet    Sig: Take 1 tablet (50 mg total) by mouth daily.    Dispense:  90 tablet    Refill:  3   mometasone (ELOCON) 0.1 % lotion    Sig: Apply topically daily.    Dispense:  60 mL    Refill:  1    Follow-up: No follow-ups on file.    Carolann Littler, MD

## 2021-03-06 ENCOUNTER — Telehealth: Payer: Self-pay | Admitting: Pharmacist

## 2021-03-06 NOTE — Chronic Care Management (AMB) (Signed)
    Chronic Care Management Pharmacy Assistant   Name: [name]  MRN: [id number] DOB: [birth year]   03/06/21 APPOINTMENT REMINDER    Patient was reminded to have all medications, supplements and any blood glucose and blood pressure readings available for review with Jeni Salles, Pharm. D, for telephone visit on 11/15 at 3:30.   Care Gaps: AWV - 4/23 COVID Vaccines - Overdue Foot exam - Overdue Eye exam - Overdue TDAP - Overdue Zoster Vaccines - Overdue PNA Vaccine - Overdue BP-120/70 (01/03/21)  Star Rating Drug: Atorvastatin (Lipitor) 10 mg - last filled on 12/25/20 90DS at [facility] Ramipril (Altace) 10 mg - last filled on 12/19/20 90DS at [facility]  Any gaps in medications fill history? None    Medications: Outpatient Encounter Medications as of 03/06/2021  Medication Sig   acetaminophen (TYLENOL) 325 MG tablet Take 325 mg by mouth 2 (two) times daily as needed for moderate pain or headache.   apixaban (ELIQUIS) 5 MG TABS tablet Take 1 tablet (5 mg total) by mouth 2 (two) times daily.   aspirin 81 MG EC tablet Take 81 mg by mouth every evening.   atorvastatin (LIPITOR) 10 MG tablet Take 0.5 tablets (5 mg total) by mouth daily after supper.   Blood Glucose Monitoring Suppl (ONE TOUCH ULTRA 2) w/Device KIT Use as directed.   Calcium Carb-Cholecalciferol (CALCIUM 600+D3 PO) Take 1 tablet by mouth daily.   furosemide (LASIX) 40 MG tablet Take 1.5 tablets (60 mg total) by mouth daily. (Patient taking differently: Take 40 mg by mouth daily. )   glucose blood test strip 1 each by Other route as needed for other. Use as instructed with One Touch glucometer.   HYDROcodone-acetaminophen (NORCO/VICODIN) 5-325 MG tablet Take 1 tablet by mouth every 4 (four) hours as needed for moderate pain or severe pain.   Magnesium Oxide 400 MG CAPS Take 1 capsule (400 mg total) by mouth daily.   metoprolol tartrate (LOPRESSOR) 100 MG tablet Take 0.5 tablets (50 mg total) by mouth 2 (two) times  daily.   mometasone (ELOCON) 0.1 % lotion Apply topically daily.   Multiple Vitamin (MULTIVITAMIN WITH MINERALS) TABS tablet Take 1 tablet by mouth daily. One-A-Day   ramipril (ALTACE) 10 MG capsule Take 2 capsules (20 mg total) by mouth daily.   sertraline (ZOLOFT) 50 MG tablet Take 1 tablet (50 mg total) by mouth daily.   vitamin B-12 (CYANOCOBALAMIN) 500 MCG tablet Take 500 mcg by mouth daily.   vitamin E 400 UNIT capsule Take 400 Units by mouth daily.   No facility-administered encounter medications on file as of 03/06/2021.  [place] Clinical Pharmacist Assistant [phone]

## 2021-03-07 ENCOUNTER — Ambulatory Visit (INDEPENDENT_AMBULATORY_CARE_PROVIDER_SITE_OTHER): Payer: PPO | Admitting: Pharmacist

## 2021-03-07 DIAGNOSIS — E119 Type 2 diabetes mellitus without complications: Secondary | ICD-10-CM

## 2021-03-07 NOTE — Progress Notes (Signed)
Chronic Care Management Pharmacy Note  03/22/2021 Name:  [name] MRN:  [id number] DOB:  06/16/1929  Summary: Pt does not check her blood sugars or blood pressure at home  Recommendations/Changes made from today's visit: -Recommended weekly monitoring of blood pressures and blood sugars -Recommended stopping vitamin E supplement due to additive risk of bleeding  Plan: BP and DM follow up in 2 months   Subjective: [name] is an 87 y.o. year old female who is a primary patient of [name], Alinda Sierras, MD.  The CCM team was consulted for assistance with disease management and care coordination needs.    Engaged with patient by telephone for follow up visit in response to provider referral for pharmacy case management and/or care coordination services.   Consent to Services:  The patient was given information about Chronic Care Management services, agreed to services, and gave verbal consent prior to initiation of services.  Please see initial visit note for detailed documentation.   Patient Care Team: Eulas Post, MD as PCP - General (Family Medicine) Thompson Grayer, MD as PCP - Electrophysiology (Cardiology) Viona Gilmore, [facility] as Pharmacist (Pharmacist)  Recent office visits: 01/03/21 Carolann Littler MD (PCP) - seen for type 2 diabetes and other issues. Patient started on mometasone 0.1% topically daily and sertraline 66m daily. Follow up in 6 months.  Recent consult visits: 10/25/20 JVictorino December(Orthopedic Surgery) - seen for fracture of metatarsal bone. No medication changes or follow up noted.   Hospital visits: Medication Reconciliation was completed by comparing discharge summary, patient's EMR and Pharmacy list, and upon discussion with patient.   [facility] 4 hours due to Multiple closed fractures of foot.    [facility] -started hydrocodone 5-3275m for every 4 hours as needed.    Medication Changes at Hospital Discharge: -Changed None   Medications Discontinued at Hospital Discharge: -Stopped None.   Medications that remain the same after Hospital Discharge:??  -All other medications will remain the same.   Objective:  Lab Results  Component Value Date   CREATININE 0.83 07/19/2020   BUN 25 07/19/2020   GFR 66.77 06/24/2019   GFRNONAA 61 09/08/2019   GFRAA 70 09/08/2019   NA 140 07/19/2020   K 4.5 07/19/2020   CALCIUM 10.1 07/19/2020   CO2 25 07/19/2020   GLUCOSE 85 07/19/2020    Lab Results  Component Value Date/Time   HGBA1C 6.5 (A) 01/03/2021 08:48 AM   HGBA1C 6.5 (H) 07/19/2020 11:15 AM   HGBA1C 6.3 (A) 01/08/2020 12:16 PM   HGBA1C 6.6 (H) 07/14/2019 08:54 AM   GFR 66.77 06/24/2019 03:55 PM   GFR 73.31 11/15/2017 01:32 PM    Last diabetic Eye exam: No results found for: HMDIABEYEEXA  Last diabetic Foot exam: No results found for: HMDIABFOOTEX   Lab Results  Component Value Date   CHOL 142 01/03/2021   HDL 38.20 (L) 01/03/2021   LDLCALC 73 01/03/2021   TRIG 152.0 (H) 01/03/2021   CHOLHDL 4 01/03/2021    Hepatic Function Latest Ref Rng & Units 01/03/2021 10/09/2018 03/20/2017  Total Protein 6.0 - 8.3 g/dL 7.1 7.0 6.9  Albumin 3.5 - 5.2 g/dL 3.7 4.3 3.9  AST 0 - 37 U/L '13 17 16  ' ALT 0 - 35 U/L '10 11 9  ' Alk Phosphatase 39 - 117 U/L 68 80 95  Total Bilirubin 0.2 - 1.2 mg/dL 1.2 0.9 0.7  Bilirubin, Direct 0.0 -  0.3 mg/dL 0.2 - 0.20    Lab Results  Component Value Date/Time   TSH 1.42 06/24/2019 03:55 PM   TSH 0.52 10/03/2009 09:10 AM    CBC Latest Ref Rng & Units 07/19/2020 12/27/2018 10/09/2018  WBC 3.4 - 10.8 x10E3/uL 9.0 13.8(H) 10.6  Hemoglobin 11.1 - 15.9 g/dL 14.5 14.6 15.2  Hematocrit 34.0 - 46.6 % 43.8 45.7 44.7  Platelets 150 - 450 x10E3/uL 301 297 358    No results found for: VD25OH  Clinical ASCVD: Yes  The ASCVD Risk score (Arnett DK, et al., 2019) failed to calculate for the  following reasons:   The 2019 ASCVD risk score is only valid for ages 19 to 53   The patient has a prior MI or stroke diagnosis    Depression screen [facility] 2/9 07/27/2020 03/13/2018 10/29/2014  Decreased Interest 0 0 0  Down, Depressed, Hopeless 0 0 0  PHQ - 2 Score 0 0 0  Some recent data might be hidden    CHA2DS2/VAS Stroke Risk Points  Current as of 7 days ago     7 >= 2 Points: High Risk  1 - 1.99 Points: Medium Risk  0 Points: Low Risk    Last Change: N/A      Details    This score determines the patient's risk of having a stroke if the  patient has atrial fibrillation.       Points Metrics  1 Has Congestive Heart Failure:  Yes    Current as of 7 days ago  1 Has Vascular Disease:  Yes    Current as of 7 days ago  1 Has Hypertension:  Yes    Current as of 7 days ago  2 Age:  38    Current as of 7 days ago  1 Has Diabetes:  Yes    Current as of 7 days ago  0 Had Stroke:  No  Had TIA:  No  Had Thromboembolism:  No    Current as of 7 days ago  1 Female:  Yes    Current as of 7 days ago     Social History   Tobacco Use  Smoking Status Former   Packs/day: 0.50   Years: 20.00   Pack years: 10.00   Types: Cigarettes   Quit date: 07/11/1996   Years since quitting: 24.7   Passive exposure: Past  Smokeless Tobacco Never   BP Readings from Last 3 Encounters:  01/03/21 120/70  10/03/20 (!) 175/101  07/19/20 126/78   Pulse Readings from Last 3 Encounters:  01/03/21 61  10/03/20 (!) 53  07/19/20 86   Wt Readings from Last 3 Encounters:  10/03/20 215 lb (97.5 kg)  07/19/20 200 lb (90.7 kg)  01/08/20 196 lb (88.9 kg)   BMI Readings from Last 3 Encounters:  10/03/20 34.70 kg/m  07/19/20 32.28 kg/m  01/08/20 31.64 kg/m    Assessment/Interventions: Review of patient past medical history, allergies, medications, health status, including review of consultants reports, laboratory and other test data, was performed as part of comprehensive evaluation and provision of  chronic care management services.   SDOH:  (Social Determinants of Health) assessments and interventions performed: No  SDOH Screenings   Alcohol Screen: Not on file  Depression (PHQ2-9): Low Risk    PHQ-2 Score: 0  Financial Resource Strain: Low Risk    Difficulty of Paying Living Expenses: Not hard at all  Food Insecurity: No Food Insecurity   Worried About Charity fundraiser  in the Last Year: Never true   Ran Out of Food in the Last Year: Never true  Housing: Low Risk    Last Housing Risk Score: 0  Physical Activity: Inactive   Days of Exercise per Week: 0 days   Minutes of Exercise per Session: 0 min  Social Connections: Moderately Isolated   Frequency of Communication with Friends and Family: More than three times a week   Frequency of Social Gatherings with Friends and Family: Once a week   Attends Religious Services: 1 to 4 times per year   Active Member of [employer] or Organizations: No   Attends Archivist Meetings: Never   Marital Status: Widowed  Stress: No Stress Concern Present   Feeling of Stress : Not at all  Tobacco Use: Medium Risk   Smoking Tobacco Use: Former   Smokeless Tobacco Use: Never   Passive Exposure: Past  Transportation Needs: No Transportation Needs   Lack of Transportation (Medical): No   Lack of Transportation (Non-Medical): No    CCM Care Plan  Allergies  Allergen Reactions   Simvastatin Rash    Medications Reviewed Today     Reviewed by Viona Gilmore, [facility] (Pharmacist) on 03/07/21 at [place] List Status: <None>   Medication Order Taking? Sig Documenting Provider Last Dose Status Informant  acetaminophen (TYLENOL) 325 MG tablet [id number]  Take 325 mg by mouth 2 (two) times daily as needed for moderate pain or headache. [provider]  Active Self  apixaban (ELIQUIS) 5 MG TABS tablet [id number]  Take 1 tablet (5 mg total) by mouth 2 (two) times daily. Thompson Grayer, MD  Active   aspirin 81 MG EC tablet [id number]   Take 81 mg by mouth every evening. [provider]  Active Self  atorvastatin (LIPITOR) 10 MG tablet [id number]  Take 0.5 tablets (5 mg total) by mouth daily after supper. Thompson Grayer, MD  Active   Blood Glucose Monitoring Suppl (ONE TOUCH ULTRA 2) w/Device KIT [id number]  Use as directed. Eulas Post, MD  Active   Calcium Carb-Cholecalciferol (CALCIUM 600+D3 PO) [id number]  Take 1 tablet by mouth daily. [provider]  Active Self  furosemide (LASIX) 40 MG tablet [id number] Yes Take 1.5 tablets (60 mg total) by mouth daily.  Patient taking differently: Take 40 mg by mouth daily.   Shirley Friar, PA-C Taking Active   glucose blood test strip [id number]  1 each by Other route as needed for other. Use as instructed with One Touch glucometer. Eulas Post, MD  Active     Discontinued 03/07/21 1601 (Completed Course)   Magnesium Oxide 400 MG CAPS [id number]  Take 1 capsule (400 mg total) by mouth daily. Isaiah Serge, NP  Active Self  metoprolol tartrate (LOPRESSOR) 100 MG tablet [id number]  Take 0.5 tablets (50 mg total) by mouth 2 (two) times daily. Thompson Grayer, MD  Active   mometasone (ELOCON) 0.1 % lotion [id number]  Apply topically daily. Eulas Post, MD  Active   Multiple Vitamin (MULTIVITAMIN WITH MINERALS) TABS tablet [id number]  Take 1 tablet by mouth daily. One-A-Day [provider]  Active Self  ramipril (ALTACE) 10 MG capsule [id number]  Take 2 capsules (20 mg total) by mouth daily. Thompson Grayer, MD  Active   sertraline (ZOLOFT) 50 MG tablet [id number]  Take 1 tablet (50 mg total) by mouth daily. Eulas Post, MD  Active   vitamin B-12 (CYANOCOBALAMIN) 500 MCG tablet [id number]  Take 500  mcg by mouth daily. [provider]  Active Self  vitamin E 400 UNIT capsule [id number]  Take 400 Units by mouth daily. [provider]  Active Self            Patient Active Problem List   Diagnosis Date Noted   Gout  01/05/2019   Atypical atrial flutter ([place]) 01/17/2015   Tachycardia-bradycardia ([place]) 09/15/2014   Encounter for therapeutic drug monitoring 09/02/2013   OSA on CPAP 03/23/2013   Obesity (BMI 30-39.9) 62/95/2841   Chronic systolic heart failure ([place]) 11/02/2012   Dizziness 11/02/2012   A-fib ([place]) 08/10/2011   Osteopenia    Long term (current) use of anticoagulants 09/21/2010   DIABETES MELLITUS 09/24/2008   HYPERCHOLESTEROLEMIA 09/24/2008   Hyperlipidemia 09/24/2008   Hypertensive cardiovascular disease 09/24/2008   MI 09/24/2008   Coronary atherosclerosis 09/24/2008   CARDIOMYOPATHY 09/24/2008   PERSISTENT ATRIAL FIBRILLATION 09/24/2008   EDEMA 09/24/2008   CAROTID BRUIT 09/24/2008    Immunization History  Administered Date(s) Administered   Fluad Quad(high Dose 65+) 01/05/2019, 01/03/2021   Influenza Split 02/20/2012   Influenza, High Dose Seasonal PF 03/03/2015, 02/21/2018   Influenza,inj,Quad PF,6+ Mos 03/13/2013, 02/08/2014, 02/13/2016, 02/06/2017   Pneumococcal Conjugate-13 04/23/1998, 02/22/2014   Patient loves sweets and cannot check her blood sugars right now. She has had a glucometer for only a few months. She checked her blood sugar once and it was higher than she thought. Patient can check it once a week and will keep log.   She also does not have a BP cuff but will try to get one. She had someone coming to clean her house once a week who does all her errands and she would get the BP cuff for her.  Conditions to be addressed/monitored:  Hypertension, Hyperlipidemia, Diabetes, Atrial Fibrillation, Heart Failure, Coronary Artery Disease, Osteopenia, and Gout  Conditions addressed this visit: Hypertension, diabetes   Care Plan : CCM Pharmacy Care Plan  Updates made by Viona Gilmore, [place] since 03/22/2021 12:00 AM     Problem: Problem: Hypertension, Hyperlipidemia, Diabetes, Atrial Fibrillation, Heart Failure, Coronary Artery Disease, Osteopenia, and Gout       Long-Range Goal: Patient-Specific Goal   Start Date: 03/07/2021  Expected End Date: 03/07/2022  This Visit's Progress: On track  Priority: High  Note:   Current Barriers:  Unable to independently monitor therapeutic efficacy  Pharmacist Clinical Goal(s):  Patient will achieve adherence to monitoring guidelines and medication adherence to achieve therapeutic efficacy through collaboration with PharmD and provider.   Interventions: 1:1 collaboration with Eulas Post, MD regarding development and update of comprehensive plan of care as evidenced by provider attestation and co-signature Inter-disciplinary care team collaboration (see longitudinal plan of care) Comprehensive medication review performed; medication list updated in electronic medical record  Hypertension (BP goal <140/90) -Not ideally controlled -Current treatment: Furosemide 40 mg 1.5 tablets daily (take 40 mg daily) Metoprolol tartrate 100 mg 1/2 tablet twice daily  Ramipril 10 mg 2 capsule daily  -Medications previously tried: n/a  -Current home readings: owns an old arm cuff but does not check regularly -Current dietary habits: tries to limit sodium intake -Current exercise habits: limited -Denies hypotensive/hypertensive symptoms -Educated on BP goals and benefits of medications for prevention of heart attack, stroke and kidney damage; Importance of home blood pressure monitoring; Proper BP monitoring technique; -Counseled to monitor BP at home weekly or every other week, document, and provide log at future appointments -Counseled on diet and exercise extensively Recommended to continue  current medication  Hyperlipidemia: (LDL goal < 70) -Not ideally controlled -Current treatment: Aspirin 81 mg daily  Atorvastatin 10 mg 1/2 tablet daily  -Medications previously tried: simvastatin  -Current dietary patterns: did not discuss -Current exercise habits: limited -Educated on Cholesterol goals;   -Recommended to continue current medication  Diabetes (A1c goal <8%) -Controlled -Current medications: No medications -Medications previously tried: n/a  -Current home glucose readings fasting glucose: does not check post prandial glucose: does not check -Denies hypoglycemic/hyperglycemic symptoms -Current meal patterns:  breakfast: n/a  lunch: n/a  dinner: n/a snacks: n/a drinks: n/a -Current exercise: limited -Educated on A1c and blood sugar goals; Benefits of routine self-monitoring of blood sugar; -Counseled to check feet daily and get yearly eye exams -Counseled on diet and exercise extensively  Atrial Fibrillation (Goal: prevent stroke and major bleeding) -Controlled -CHADSVASC: 7 -Current treatment: Rate control: Metoprolol Tartrate 100 mg 1/2 tab twice daily Anticoagulation: Apixaban 5 mg twice daily -Medications previously tried: Dofetilide (2012-2020 d/t recurrence and cost), Amiodarone (2011-2012 d/t PFTs) -Home BP and HR readings: does not check  -Counseled on importance of adherence to anticoagulant exactly as prescribed; bleeding risk associated with Eliquis and importance of self-monitoring for signs/symptoms of bleeding; avoidance of NSAIDs due to increased bleeding risk with anticoagulants; -Recommended to continue current medication  Heart Failure (Goal: manage symptoms and prevent exacerbations) -Controlled -Last ejection fraction: 20-25% (Date: 10/16/14) -HF type: Systolic -NYHA Class: III (marked limitation of activity) -AHA HF Stage: C (Heart disease and symptoms present) -Current treatment: Furosemide 40 mg 1.5 tablets daily (taking one tablet daily)  Metoprolol tartrate 100 mg 1/2 tablet twice daily  Ramipril 10 mg 1 capsule twice daily  -Medications previously tried: n/a  -Current home BP/HR readings: does not check -Current dietary habits: not able to cook right now (meals on wheels) -Current exercise habits: limited -Educated on Importance  of weighing daily; if you gain more than 3 pounds in one day or 5 pounds in one week, call cardiology. -Counseled on diet and exercise extensively Recommended to continue current medication Recommended keeping appointment with cardiology.  Depression/Anxiety (Goal: minimize symptoms) -Controlled -Current treatment: Sertraline 50 mg 1 tablet daily -Medications previously tried/failed: n/a -PHQ9: 0 -GAD7: n/a -Educated on Benefits of medication for symptom control -Recommended to continue current medication  Osteoporosis (Goal prevent fractures) -Not ideally controlled -Last DEXA Scan: 09/18/96 (unable to access)   T-Score femoral neck: n/a  T-Score total hip: n/a  T-Score lumbar spine: n/a  T-Score forearm radius: n/a  10-year probability of major osteoporotic fracture: n/a  10-year probability of hip fracture: n/a -Patient is not a candidate for pharmacologic treatment -Current treatment  Calcium 600 mg + D3 daily -Medications previously tried: none  -Recommend [phone] units of vitamin D daily. Recommend 1200 mg of calcium daily from dietary and supplemental sources. Recommend weight-bearing and muscle strengthening exercises for building and maintaining bone density. -Counseled on diet and exercise extensively   Health Maintenance -Vaccine gaps:  COVID, shingrix, tetanus, pneumonia -Current therapy:  APAP 325 mg 2 tabs daily PRN Magnesium Oxide 400 mg daily (taking one 200 mg)   Multivitamin Daily (One-A-Day) Vitamin B12 500 mcg daily  Vitamin E 400 units daily (gave it to her when she had heart attack) -Educated on Herbal supplement research is limited and benefits usually cannot be proven Cost vs benefit of each product must be carefully weighed by individual consumer -Patient is satisfied with current therapy and denies issues -Recommended stopping vitamin E due to additive bleeding risk.  Patient Goals/Self-Care Activities Patient will:  - take medications as  prescribed as evidenced by patient report and record review check glucose weekly, document, and provide at future appointments check blood pressure weekly or every other week, document, and provide at future appointments  Follow Up Plan: The care management team will reach out to the patient again over the next 60 days.        Medication Assistance: None required.  Patient affirms current coverage meets needs.  Compliance/Adherence/Medication fill history: Care Gaps: COVID vaccine, foot exam, eye exam, tetanus, shingrix, Prevnar 20 or Pneumovax BP-120/70 (01/03/21)  Star-Rating Drugs: Atorvastatin (Lipitor) 10 mg - last filled on 12/25/20 90DS at [facility] Ramipril (Altace) 10 mg - last filled on 12/19/20 90DS at [facility] preferred pharmacy is:  [place], [place] - [place] [address] [place] [address] Phone: [phone] Fax: [phone]  [facility]) - [place], [place] [address] Phone: [phone] Fax: [phone]  [id number] [place] [place]: [phone]: [phone] Uses pill box? Yes Pt endorses 99% compliance  We discussed: Current pharmacy is preferred with insurance plan and patient is satisfied with pharmacy services Patient decided to: Continue current medication management strategy  Care Plan and Follow Up Patient Decision:  Patient agrees to Care Plan and Follow-up.  Plan: The care management team will reach out to the patient again over the next 60 days.  MJeni Salles PharmD, BCaberfaePharmacist [place]

## 2021-03-22 DIAGNOSIS — E785 Hyperlipidemia, unspecified: Secondary | ICD-10-CM | POA: Diagnosis not present

## 2021-03-22 DIAGNOSIS — I110 Hypertensive heart disease with heart failure: Secondary | ICD-10-CM

## 2021-03-22 DIAGNOSIS — I5022 Chronic systolic (congestive) heart failure: Secondary | ICD-10-CM

## 2021-03-22 DIAGNOSIS — I2510 Atherosclerotic heart disease of native coronary artery without angina pectoris: Secondary | ICD-10-CM

## 2021-03-22 DIAGNOSIS — E1159 Type 2 diabetes mellitus with other circulatory complications: Secondary | ICD-10-CM

## 2021-03-22 NOTE — Patient Instructions (Signed)
Hi [place],  It was great to get to meet you over the telephone! Below is a summary of some of the topics we discussed.   Don't forget to start checking your blood pressure and blood sugars at home once a week.  Please reach out to me if you have any questions or need anything before our follow up!  Best, Maddie  Gaylord Shih, PharmD, [facility] Clinical Pharmacist [facility] [place] [phone]   Visit Information   Goals Addressed   None    Patient Care Plan: CCM Pharmacy Care Plan     Problem Identified: Problem: Hypertension, Hyperlipidemia, Diabetes, Atrial Fibrillation, Heart Failure, Coronary Artery Disease, Osteopenia, and Gout      Long-Range Goal: Patient-Specific Goal   Start Date: 03/07/2021  Expected End Date: 03/07/2022  This Visit's Progress: On track  Priority: High  Note:   Current Barriers:  Unable to independently monitor therapeutic efficacy  Pharmacist Clinical Goal(s):  Patient will achieve adherence to monitoring guidelines and medication adherence to achieve therapeutic efficacy through collaboration with PharmD and provider.   Interventions: 1:1 collaboration with [name], MD regarding development and update of comprehensive plan of care as evidenced by provider attestation and co-signature Inter-disciplinary care team collaboration (see longitudinal plan of care) Comprehensive medication review performed; medication list updated in electronic medical record  Hypertension (BP goal <140/90) -Not ideally controlled -Current treatment: Furosemide 40 mg 1.5 tablets daily (take 40 mg daily) Metoprolol tartrate 100 mg 1/2 tablet twice daily  Ramipril 10 mg 2 capsule daily  -Medications previously tried: n/a  -Current home readings: owns an old arm cuff but does not check regularly -Current dietary habits: tries to limit sodium intake -Current exercise habits: limited -Denies hypotensive/hypertensive symptoms -Educated on  BP goals and benefits of medications for prevention of heart attack, stroke and kidney damage; Importance of home blood pressure monitoring; Proper BP monitoring technique; -Counseled to monitor BP at home weekly or every other week, document, and provide log at future appointments -Counseled on diet and exercise extensively Recommended to continue current medication  Hyperlipidemia: (LDL goal < 70) -Not ideally controlled -Current treatment: Aspirin 81 mg daily  Atorvastatin 10 mg 1/2 tablet daily  -Medications previously tried: simvastatin  -Current dietary patterns: did not discuss -Current exercise habits: limited -Educated on Cholesterol goals;  -Recommended to continue current medication  Diabetes (A1c goal <8%) -Controlled -Current medications: No medications -Medications previously tried: n/a  -Current home glucose readings fasting glucose: does not check post prandial glucose: does not check -Denies hypoglycemic/hyperglycemic symptoms -Current meal patterns:  breakfast: n/a  lunch: n/a  dinner: n/a snacks: n/a drinks: n/a -Current exercise: limited -Educated on A1c and blood sugar goals; Benefits of routine self-monitoring of blood sugar; -Counseled to check feet daily and get yearly eye exams -Counseled on diet and exercise extensively  Atrial Fibrillation (Goal: prevent stroke and major bleeding) -Controlled -CHADSVASC: 7 -Current treatment: Rate control: Metoprolol Tartrate 100 mg 1/2 tab twice daily Anticoagulation: Apixaban 5 mg twice daily -Medications previously tried: Dofetilide (2012-2020 d/t recurrence and cost), Amiodarone (2011-2012 d/t PFTs) -Home BP and HR readings: does not check  -Counseled on importance of adherence to anticoagulant exactly as prescribed; bleeding risk associated with Eliquis and importance of self-monitoring for signs/symptoms of bleeding; avoidance of NSAIDs due to increased bleeding risk with anticoagulants; -Recommended  to continue current medication  Heart Failure (Goal: manage symptoms and prevent exacerbations) -Controlled -Last ejection fraction: 20-25% (Date: 10/16/14) -HF type: Systolic -NYHA Class: III (marked limitation  of activity) -AHA HF Stage: C (Heart disease and symptoms present) -Current treatment: Furosemide 40 mg 1.5 tablets daily (taking one tablet daily)  Metoprolol tartrate 100 mg 1/2 tablet twice daily  Ramipril 10 mg 1 capsule twice daily  -Medications previously tried: n/a  -Current home BP/HR readings: does not check -Current dietary habits: not able to cook right now (meals on wheels) -Current exercise habits: limited -Educated on Importance of weighing daily; if you gain more than 3 pounds in one day or 5 pounds in one week, call cardiology. -Counseled on diet and exercise extensively Recommended to continue current medication Recommended keeping appointment with cardiology.  Depression/Anxiety (Goal: minimize symptoms) -Controlled -Current treatment: Sertraline 50 mg 1 tablet daily -Medications previously tried/failed: n/a -PHQ9: 0 -GAD7: n/a -Educated on Benefits of medication for symptom control -Recommended to continue current medication  Osteoporosis (Goal prevent fractures) -Not ideally controlled -Last DEXA Scan: 09/18/96 (unable to access)   T-Score femoral neck: n/a  T-Score total hip: n/a  T-Score lumbar spine: n/a  T-Score forearm radius: n/a  10-year probability of major osteoporotic fracture: n/a  10-year probability of hip fracture: n/a -Patient is not a candidate for pharmacologic treatment -Current treatment  Calcium 600 mg + D3 daily -Medications previously tried: none  -Recommend [phone] units of vitamin D daily. Recommend 1200 mg of calcium daily from dietary and supplemental sources. Recommend weight-bearing and muscle strengthening exercises for building and maintaining bone density. -Counseled on diet and exercise extensively   Health  Maintenance -Vaccine gaps:  COVID, shingrix, tetanus, pneumonia -Current therapy:  APAP 325 mg 2 tabs daily PRN Magnesium Oxide 400 mg daily (taking one 200 mg)   Multivitamin Daily (One-A-Day) Vitamin B12 500 mcg daily  Vitamin E 400 units daily (gave it to her when she had heart attack) -Educated on Herbal supplement research is limited and benefits usually cannot be proven Cost vs benefit of each product must be carefully weighed by individual consumer -Patient is satisfied with current therapy and denies issues -Recommended stopping vitamin E due to additive bleeding risk.  Patient Goals/Self-Care Activities Patient will:  - take medications as prescribed as evidenced by patient report and record review check glucose weekly, document, and provide at future appointments check blood pressure weekly or every other week, document, and provide at future appointments  Follow Up Plan: The care management team will reach out to the patient again over the next 60 days.        Patient verbalizes understanding of instructions provided today and agrees to view in [place].  The pharmacy team will reach out to the patient again over the next 60 days.   Viona Gilmore, [facility]

## 2021-03-23 ENCOUNTER — Encounter (HOSPITAL_BASED_OUTPATIENT_CLINIC_OR_DEPARTMENT_OTHER): Payer: Self-pay | Admitting: Emergency Medicine

## 2021-03-23 ENCOUNTER — Other Ambulatory Visit: Payer: Self-pay

## 2021-03-23 ENCOUNTER — Emergency Department (HOSPITAL_BASED_OUTPATIENT_CLINIC_OR_DEPARTMENT_OTHER)
Admission: EM | Admit: 2021-03-23 | Discharge: 2021-03-23 | Disposition: A | Discharge status: Home / Self Care | Payer: PPO | Attending: Emergency Medicine | Admitting: Emergency Medicine

## 2021-03-23 DIAGNOSIS — Z87891 Personal history of nicotine dependence: Secondary | ICD-10-CM | POA: Insufficient documentation

## 2021-03-23 DIAGNOSIS — I110 Hypertensive heart disease with heart failure: Secondary | ICD-10-CM | POA: Diagnosis not present

## 2021-03-23 DIAGNOSIS — Z7982 Long term (current) use of aspirin: Secondary | ICD-10-CM | POA: Insufficient documentation

## 2021-03-23 DIAGNOSIS — I4891 Unspecified atrial fibrillation: Secondary | ICD-10-CM | POA: Insufficient documentation

## 2021-03-23 DIAGNOSIS — R040 Epistaxis: Secondary | ICD-10-CM | POA: Diagnosis not present

## 2021-03-23 DIAGNOSIS — I5022 Chronic systolic (congestive) heart failure: Secondary | ICD-10-CM | POA: Diagnosis not present

## 2021-03-23 DIAGNOSIS — I2510 Atherosclerotic heart disease of native coronary artery without angina pectoris: Secondary | ICD-10-CM | POA: Insufficient documentation

## 2021-03-23 DIAGNOSIS — Z79899 Other long term (current) drug therapy: Secondary | ICD-10-CM | POA: Diagnosis not present

## 2021-03-23 DIAGNOSIS — Z7901 Long term (current) use of anticoagulants: Secondary | ICD-10-CM | POA: Diagnosis not present

## 2021-03-23 DIAGNOSIS — R58 Hemorrhage, not elsewhere classified: Secondary | ICD-10-CM | POA: Diagnosis not present

## 2021-03-23 DIAGNOSIS — E119 Type 2 diabetes mellitus without complications: Secondary | ICD-10-CM | POA: Insufficient documentation

## 2021-03-23 MED ORDER — OXYMETAZOLINE HCL 0.05 % NA SOLN
1.0000 | Freq: Once | NASAL | Status: AC
  Administered 2021-03-23: 1 via NASAL
  Filled 2021-03-23: qty 30

## 2021-03-23 MED ORDER — SILVER NITRATE-POT NITRATE 75-25 % EX MISC
1.0000 "application " | Freq: Once | CUTANEOUS | Status: AC
  Administered 2021-03-23: 1 via TOPICAL
  Filled 2021-03-23: qty 10

## 2021-03-23 MED ORDER — LIDOCAINE HCL 4 % EX SOLN
Freq: Once | CUTANEOUS | Status: AC
  Administered 2021-03-23: 1 mL via TOPICAL
  Filled 2021-03-23: qty 50

## 2021-03-23 NOTE — ED Provider Notes (Signed)
[place] EMERGENCY DEPT Provider Note   CSN: [id number] Arrival date & time: 03/23/21  0736     History Chief Complaint  Patient presents with   Epistaxis    [name] is a 87 y.o. female.   Epistaxis Patient presents with nosebleed.  Is on Eliquis.  Mostly on the left side but states is coming down both sides and going down the back.  History of atrial fibrillation.  Does not feel lightheaded or dizziness.  No trauma.  No difficulty breathing.  Has coughed up some blood.    Past Medical History:  Diagnosis Date   Anemia    CAD 09/24/2008   AMI in 1998 tx with PCI to LAD;  myoview 1/12:  no ischemia, EF 45%   CARDIOMYOPATHY 09/24/2008   ischemic;  echo 4/12:  EF 20-25%, mild MR, mod LAE, mod reduced RVSF, mod RVE, mild RAE, mild TR, PASP 40   CAROTID BRUIT 09/24/2008   DIABETES MELLITUS 09/24/2008   Edema 09/24/2008   GERD (gastroesophageal reflux disease)    HYPERLIPIDEMIA 09/24/2008   HYPERTENSION 09/24/2008   MI 09/24/2008   Osteopenia    Persistent atrial fibrillation ([place]) 09/24/2008   s/p DCCV x 4 in past;  Amiod d/c'd 2/2 abnormal PFTs;  Tikosyn load 6/12 with DCCV   Tachycardia-bradycardia [facility])     Patient Active Problem List   Diagnosis Date Noted   Gout 01/05/2019   Atypical atrial flutter ([place]) 01/17/2015   Tachycardia-bradycardia ([place]) 09/15/2014   Encounter for therapeutic drug monitoring 09/02/2013   OSA on CPAP 03/23/2013   Obesity (BMI 30-39.9) 06/03/1550   Chronic systolic heart failure ([place]) 11/02/2012   Dizziness 11/02/2012   A-fib ([place]) 08/10/2011   Osteopenia    Long term (current) use of anticoagulants 09/21/2010   DIABETES MELLITUS 09/24/2008   HYPERCHOLESTEROLEMIA 09/24/2008   Hyperlipidemia 09/24/2008   Hypertensive cardiovascular disease 09/24/2008   MI 09/24/2008   Coronary atherosclerosis 09/24/2008   CARDIOMYOPATHY 09/24/2008   PERSISTENT ATRIAL FIBRILLATION 09/24/2008   EDEMA 09/24/2008   CAROTID BRUIT 09/24/2008     Past Surgical History:  Procedure Laterality Date   CARDIOVERSION N/A 02/03/2015   Procedure: CARDIOVERSION;  Surgeon: Dorothy Spark, MD;  Location: [place];  Service: Cardiovascular;  Laterality: N/A;   CARDIOVERSION N/A 07/08/2018   Procedure: CARDIOVERSION;  Surgeon: Thayer Headings, MD;  Location: [place];  Service: Cardiovascular;  Laterality: N/A;  getting INR prior   ELECTROPHYSIOLOGIC STUDY N/A 10/15/2014   PVI and CTI ablation by Dr Rayann Heman   HIP FRACTURE SURGERY  2006   KNEE SURGERY  2008   broken knee   LOOP RECORDER IMPLANT N/A 11/06/2012   Procedure: LOOP RECORDER IMPLANT;  Surgeon: Thompson Grayer, MD;  Location: [facility] CATH LAB;  Service: Cardiovascular;  Laterality: N/A;Medtronic LinQ implanted by Dr Rayann Heman for palpitaitons and dizziness   TEE WITHOUT CARDIOVERSION N/A 10/15/2014   Procedure: TRANSESOPHAGEAL ECHOCARDIOGRAM (TEE);  Surgeon: Thayer Headings, MD;  Location: [facility] ENDOSCOPY;  Service: Cardiovascular;  Laterality: N/A;     OB History   No obstetric history on file.     Family History  Problem Relation Age of Onset   Breast cancer Mother    Heart disease Mother    Diabetes Mother    Alcohol abuse Father    Breast cancer Sister    Brain cancer Sister        cause of death at 85 years old   Alcohol abuse Brother    Heart disease  Brother    Heart disease Brother    Cancer Brother        unknown type   Coronary artery disease Other    Hypertension Other    Heart attack Brother    Heart attack Brother     Social History   Tobacco Use   Smoking status: Former    Packs/day: 0.50    Years: 20.00    Pack years: 10.00    Types: Cigarettes    Quit date: 07/11/1996    Years since quitting: 24.7    Passive exposure: Past   Smokeless tobacco: Never  Substance Use Topics   Alcohol use: No   Drug use: No    Home Medications Prior to Admission medications   Medication Sig Start Date End Date Taking? Authorizing Provider  acetaminophen  (TYLENOL) 325 MG tablet Take 325 mg by mouth 2 (two) times daily as needed for moderate pain or headache.    [provider]  apixaban (ELIQUIS) 5 MG TABS tablet Take 1 tablet (5 mg total) by mouth 2 (two) times daily. 10/19/20   Thompson Grayer, MD  aspirin 81 MG EC tablet Take 81 mg by mouth every evening.    [provider]  atorvastatin (LIPITOR) 10 MG tablet Take 0.5 tablets (5 mg total) by mouth daily after supper. 08/08/20   Allred, Jeneen Rinks, MD  Blood Glucose Monitoring Suppl (ONE TOUCH ULTRA 2) w/Device KIT Use as directed. 09/06/20   [name], Alinda Sierras, MD  Calcium Carb-Cholecalciferol (CALCIUM 600+D3 PO) Take 1 tablet by mouth daily.    [provider]  furosemide (LASIX) 40 MG tablet Take 1.5 tablets (60 mg total) by mouth daily. Patient taking differently: Take 40 mg by mouth daily. 09/08/19 03/07/21  Shirley Friar, PA-C  glucose blood test strip 1 each by Other route as needed for other. Use as instructed with One Touch glucometer. 09/30/15   [name], Alinda Sierras, MD  Magnesium Oxide 400 MG CAPS Take 1 capsule (400 mg total) by mouth daily. 03/20/17   Isaiah Serge, NP  metoprolol tartrate (LOPRESSOR) 100 MG tablet Take 0.5 tablets (50 mg total) by mouth 2 (two) times daily. 10/14/20   Allred, Jeneen Rinks, MD  mometasone (ELOCON) 0.1 % lotion Apply topically daily. 01/03/21   [name], Alinda Sierras, MD  Multiple Vitamin (MULTIVITAMIN WITH MINERALS) TABS tablet Take 1 tablet by mouth daily. One-A-Day    [provider]  ramipril (ALTACE) 10 MG capsule Take 2 capsules (20 mg total) by mouth daily. 12/15/20   Allred, Jeneen Rinks, MD  sertraline (ZOLOFT) 50 MG tablet Take 1 tablet (50 mg total) by mouth daily. 01/03/21   [name], Alinda Sierras, MD  vitamin B-12 (CYANOCOBALAMIN) 500 MCG tablet Take 500 mcg by mouth daily.    [provider]  vitamin E 400 UNIT capsule Take 400 Units by mouth daily.    [provider]    Allergies    Simvastatin  Review of  Systems   Review of Systems  Constitutional:  Negative for appetite change.  HENT:  Positive for nosebleeds.   Respiratory:  Negative for shortness of breath.   Cardiovascular:  Negative for chest pain.  Gastrointestinal:  Negative for abdominal pain.  Skin:  Negative for wound.  Hematological:  Bruises/bleeds easily.   Physical Exam Updated Vital Signs BP (!) 145/99 (BP Location: Left Arm)   Pulse 99   Temp 98.3 F (36.8 C) (Oral)   Resp 18   Ht '5\' 6"'  (1.676 m)  Wt 97.5 kg   SpO2 95%   BMI 34.69 kg/m   Physical Exam Vitals and nursing note reviewed.  HENT:     Nose:     Comments: Some blood in left nare.  Not actively dripping.  Less blood on right side.  Mild posterior pharyngeal blood. Cardiovascular:     Rate and Rhythm: Regular rhythm.  Abdominal:     Tenderness: There is no abdominal tenderness.  Musculoskeletal:        General: No tenderness.  Skin:    Capillary Refill: Capillary refill takes less than 2 seconds.  Neurological:     Mental Status: She is alert and oriented to person, place, and time.    ED Results / Procedures / Treatments   Labs (all labs ordered are listed, but only abnormal results are displayed) Labs Reviewed - No data to display  EKG None  Radiology No results found.  Procedures .Epistaxis Management  Date/Time: 03/23/2021 8:21 AM Performed by: Davonna Belling, MD Authorized by: Davonna Belling, MD   Consent:    Consent obtained:  Verbal   Consent given by:  Patient   Risks, benefits, and alternatives were discussed: yes     Risks discussed:  Bleeding, infection and nasal injury   Alternatives discussed:  Delayed treatment, alternative treatment, observation and no treatment Universal protocol:    Procedure explained and questions answered to patient or proxy's satisfaction: yes   Anesthesia:    Anesthesia method:  Topical application   Topical anesthesia: 4% lidocaine. Procedure details:    Treatment site:  L  anterior   Treatment method:  Nasal balloon   Treatment complexity:  Limited   Treatment episode: initial   Post-procedure details:    Assessment:  Bleeding stopped   Procedure completion:  Tolerated well, no immediate complications   Medications Ordered in ED Medications  oxymetazoline (AFRIN) 0.05 % nasal spray 1 spray (1 spray Each Nare Given 03/23/21 0800)  lidocaine (XYLOCAINE) 4 % external solution (1 mL Topical Given 03/23/21 0800)  silver nitrate applicators applicator 1 application (1 application Topical Given 03/23/21 0800)    ED Course  I have reviewed the triage vital signs and the nursing notes.  Pertinent labs & imaging results that were available during my care of the patient were reviewed by me and considered in my medical decision making (see chart for details).    MDM Rules/Calculators/A&P                           Patient with nosebleed.  On anticoagulation.  On Eliquis.  [place] with Afrin and lidocaine 4% placed in nose.  Removed and still had some bleeding.  Too brisk to cauterize.  Anterior nasal balloon placed with good control bleeding.  Doubt severe anemia at this time.  Follow-up with ENT either tomorrow or Monday.  Appears stable for discharge for Final Clinical Impression(s) / ED Diagnoses Final diagnoses:  Anterior epistaxis    Rx / DC Orders ED Discharge Orders     None        Davonna Belling, MD 03/23/21 [phone]

## 2021-03-23 NOTE — ED Triage Notes (Addendum)
Pt arrives to ED via [facility] with c/o nosebleed that started this morning. Pt is on eliquis. EMS gave two sprays Afrin into each nostril.

## 2021-03-26 ENCOUNTER — Other Ambulatory Visit: Payer: Self-pay

## 2021-03-26 ENCOUNTER — Emergency Department (HOSPITAL_BASED_OUTPATIENT_CLINIC_OR_DEPARTMENT_OTHER)
Admission: EM | Admit: 2021-03-26 | Discharge: 2021-03-26 | Disposition: A | Discharge status: Home / Self Care | Payer: PPO | Attending: Emergency Medicine | Admitting: Emergency Medicine

## 2021-03-26 ENCOUNTER — Encounter (HOSPITAL_BASED_OUTPATIENT_CLINIC_OR_DEPARTMENT_OTHER): Payer: Self-pay | Admitting: *Deleted

## 2021-03-26 DIAGNOSIS — I5022 Chronic systolic (congestive) heart failure: Secondary | ICD-10-CM | POA: Insufficient documentation

## 2021-03-26 DIAGNOSIS — Z7901 Long term (current) use of anticoagulants: Secondary | ICD-10-CM | POA: Diagnosis not present

## 2021-03-26 DIAGNOSIS — Z79899 Other long term (current) drug therapy: Secondary | ICD-10-CM | POA: Diagnosis not present

## 2021-03-26 DIAGNOSIS — I4819 Other persistent atrial fibrillation: Secondary | ICD-10-CM | POA: Insufficient documentation

## 2021-03-26 DIAGNOSIS — I110 Hypertensive heart disease with heart failure: Secondary | ICD-10-CM | POA: Diagnosis not present

## 2021-03-26 DIAGNOSIS — R040 Epistaxis: Secondary | ICD-10-CM | POA: Insufficient documentation

## 2021-03-26 DIAGNOSIS — I2510 Atherosclerotic heart disease of native coronary artery without angina pectoris: Secondary | ICD-10-CM | POA: Diagnosis not present

## 2021-03-26 DIAGNOSIS — E119 Type 2 diabetes mellitus without complications: Secondary | ICD-10-CM | POA: Insufficient documentation

## 2021-03-26 DIAGNOSIS — Z87891 Personal history of nicotine dependence: Secondary | ICD-10-CM | POA: Insufficient documentation

## 2021-03-26 DIAGNOSIS — Z7982 Long term (current) use of aspirin: Secondary | ICD-10-CM | POA: Diagnosis not present

## 2021-03-26 MED ORDER — ONDANSETRON 4 MG PO TBDP
4.0000 mg | ORAL_TABLET | Freq: Once | ORAL | Status: AC
  Administered 2021-03-26: 23:00:00 4 mg via ORAL
  Filled 2021-03-26: qty 1

## 2021-03-26 MED ORDER — ONDANSETRON 4 MG PO TBDP: 4.0000 mg | ORAL_TABLET | Freq: Three times a day (TID) | ORAL | 0 refills | Status: DC | PRN

## 2021-03-26 MED ORDER — ACETAMINOPHEN 500 MG PO TABS
1000.0000 mg | ORAL_TABLET | Freq: Once | ORAL | Status: AC
  Administered 2021-03-26: 23:00:00 1000 mg via ORAL
  Filled 2021-03-26: qty 2

## 2021-03-26 NOTE — Discharge Instructions (Signed)
If bleeding recurs, spray Afrin.  Follow-up with ENT as planned.  You may take Tylenol for pain.

## 2021-03-26 NOTE — ED Triage Notes (Signed)
Pt was seen with nosebleed on Thursday, balloon placed in the left nostril. Reports has had small amount of leaking since then, but today around 1850 started bleeding heavier. EMS was called to the home, sprayed Afrin in her nose. Does not appear to have bleeding during time of triage. On Eliquis. C/o upper back/shoulder pain.

## 2021-03-26 NOTE — ED Provider Notes (Signed)
[facility] EMERGENCY DEPT Provider Note   CSN: [id number] Arrival date & time: 03/26/21  1958     History Chief Complaint  Patient presents with   Epistaxis    [name] is a 87 y.o. female.  HPI     Is an 87-year-old female with a history of atrial fibrillation on Eliquis, diabetes, hypertension, hyperlipidemia who presents with bleeding.  Patient was seen and evaluated on Thursday and had a nosebleed packed with an anterior balloon.  She has follow-up with ENT on Wednesday.  She has had some mild oozing but no significant bleeding until earlier this evening when they noted significant bleeding from the left naris.  She has also coughed up some clots.  She feels that blood is going down the back of her throat.  She did apply Afrin by EMS and the bleeding seems to have slowed significantly.  Past Medical History:  Diagnosis Date   Anemia    CAD 09/24/2008   AMI in 1998 tx with PCI to LAD;  myoview 1/12:  no ischemia, EF 45%   CARDIOMYOPATHY 09/24/2008   ischemic;  echo 4/12:  EF 20-25%, mild MR, mod LAE, mod reduced RVSF, mod RVE, mild RAE, mild TR, PASP 40   CAROTID BRUIT 09/24/2008   DIABETES MELLITUS 09/24/2008   Edema 09/24/2008   GERD (gastroesophageal reflux disease)    HYPERLIPIDEMIA 09/24/2008   HYPERTENSION 09/24/2008   MI 09/24/2008   Osteopenia    Persistent atrial fibrillation (HCC) 09/24/2008   s/p DCCV x 4 in past;  Amiod d/c'd 2/2 abnormal PFTs;  Tikosyn load 6/12 with DCCV   Tachycardia-bradycardia (HCC)     Patient Active Problem List   Diagnosis Date Noted   Gout 01/05/2019   Atypical atrial flutter (HCC) 01/17/2015   Tachycardia-bradycardia (HCC) 09/15/2014   Encounter for therapeutic drug monitoring 09/02/2013   OSA on CPAP 03/23/2013   Obesity (BMI 30-39.9) 03/23/2013   Chronic systolic heart failure (HCC) 11/02/2012   Dizziness 11/02/2012   A-fib (HCC) 08/10/2011   Osteopenia    Long term (current) use of anticoagulants 09/21/2010    DIABETES MELLITUS 09/24/2008   HYPERCHOLESTEROLEMIA 09/24/2008   Hyperlipidemia 09/24/2008   Hypertensive cardiovascular disease 09/24/2008   MI 09/24/2008   Coronary atherosclerosis 09/24/2008   CARDIOMYOPATHY 09/24/2008   PERSISTENT ATRIAL FIBRILLATION 09/24/2008   EDEMA 09/24/2008   CAROTID BRUIT 09/24/2008    Past Surgical History:  Procedure Laterality Date   CARDIOVERSION N/A 02/03/2015   Procedure: CARDIOVERSION;  Surgeon: Katarina H Nelson, MD;  Location: [facility];  Service: Cardiovascular;  Laterality: N/A;   CARDIOVERSION N/A 07/08/2018   Procedure: CARDIOVERSION;  Surgeon: Nahser, Philip J, MD;  Location: [facility];  Service: Cardiovascular;  Laterality: N/A;  getting INR prior   ELECTROPHYSIOLOGIC STUDY N/A 10/15/2014   PVI and CTI ablation by Dr Allred   HIP FRACTURE SURGERY  2006   KNEE SURGERY  2008   broken knee   LOOP RECORDER IMPLANT N/A 11/06/2012   Procedure: LOOP RECORDER IMPLANT;  Surgeon: James Allred, MD;  Location: [facility];  Service: Cardiovascular;  Laterality: N/A;Medtronic LinQ implanted by Dr Allred for palpitaitons and dizziness   TEE WITHOUT CARDIOVERSION N/A 10/15/2014   Procedure: TRANSESOPHAGEAL ECHOCARDIOGRAM (TEE);  Surgeon: Philip J Nahser, MD;  Location: [facility];  Service: Cardiovascular;  Laterality: N/A;     OB History   No obstetric history on file.     Family History  Problem Relation Age of Onset   Breast cancer   Mother    Heart disease Mother    Diabetes Mother    Alcohol abuse Father    Breast cancer Sister    Brain cancer Sister        cause of death at 46 years old   Alcohol abuse Brother    Heart disease Brother    Heart disease Brother    Cancer Brother        unknown type   Coronary artery disease Other    Hypertension Other    Heart attack Brother    Heart attack Brother     Social History   Tobacco Use   Smoking status: Former    Packs/day: 0.50    Years: 20.00    Pack years: 10.00    Types:  Cigarettes    Quit date: 07/11/1996    Years since quitting: 24.7    Passive exposure: Past   Smokeless tobacco: Never  Substance Use Topics   Alcohol use: No   Drug use: No    Home Medications Prior to Admission medications   Medication Sig Start Date End Date Taking? Authorizing Provider  ondansetron (ZOFRAN-ODT) 4 MG disintegrating tablet Take 1 tablet (4 mg total) by mouth every 8 (eight) hours as needed for nausea or vomiting. 03/26/21  Yes ,  F, MD  acetaminophen (TYLENOL) 325 MG tablet Take 325 mg by mouth 2 (two) times daily as needed for moderate pain or headache.    [provider]  apixaban (ELIQUIS) 5 MG TABS tablet Take 1 tablet (5 mg total) by mouth 2 (two) times daily. 10/19/20   Allred, James, MD  aspirin 81 MG EC tablet Take 81 mg by mouth every evening.    [provider]  atorvastatin (LIPITOR) 10 MG tablet Take 0.5 tablets (5 mg total) by mouth daily after supper. 08/08/20   Allred, James, MD  Blood Glucose Monitoring Suppl (ONE TOUCH ULTRA 2) w/Device KIT Use as directed. 09/06/20   [name], Bruce W, MD  Calcium Carb-Cholecalciferol (CALCIUM 600+D3 PO) Take 1 tablet by mouth daily.    [provider]  furosemide (LASIX) 40 MG tablet Take 1.5 tablets (60 mg total) by mouth daily. Patient taking differently: Take 40 mg by mouth daily. 09/08/19 03/07/21  Tillery, Michael Andrew, PA-C  glucose blood test strip 1 each by Other route as needed for other. Use as instructed with One Touch glucometer. 09/30/15   [name], Bruce W, MD  Magnesium Oxide 400 MG CAPS Take 1 capsule (400 mg total) by mouth daily. 03/20/17   Ingold, Laura R, NP  metoprolol tartrate (LOPRESSOR) 100 MG tablet Take 0.5 tablets (50 mg total) by mouth 2 (two) times daily. 10/14/20   Allred, James, MD  mometasone (ELOCON) 0.1 % lotion Apply topically daily. 01/03/21   [name], Bruce W, MD  Multiple Vitamin (MULTIVITAMIN WITH MINERALS) TABS tablet Take 1 tablet by mouth  daily. One-A-Day    [provider]  ramipril (ALTACE) 10 MG capsule Take 2 capsules (20 mg total) by mouth daily. 12/15/20   Allred, James, MD  sertraline (ZOLOFT) 50 MG tablet Take 1 tablet (50 mg total) by mouth daily. 01/03/21   [name], Bruce W, MD  vitamin B-12 (CYANOCOBALAMIN) 500 MCG tablet Take 500 mcg by mouth daily.    [provider]  vitamin E 400 UNIT capsule Take 400 Units by mouth daily.    [provider]    Allergies    Simvastatin  Review of Systems   Review of Systems    Constitutional:  Negative for fever.  HENT:  Positive for nosebleeds.   Respiratory:  Negative for cough and shortness of breath.   Cardiovascular:  Negative for chest pain.  All other systems reviewed and are negative.  Physical Exam Updated Vital Signs BP 108/68   Pulse 92   Temp 98.2 F (36.8 C)   Resp 20   Ht 1.676 m (5' 6")   Wt 97 kg   SpO2 94%   BMI 34.52 kg/m   Physical Exam Vitals and nursing note reviewed.  Constitutional:      Appearance: She is well-developed.     Comments: Elderly, chronically ill-appearing but nontoxic  HENT:     Head: Normocephalic and atraumatic.     Nose:     Comments: Anterior pack in the left naris, no active bleeding    Mouth/Throat:     Comments: No blood noted in the posterior oropharynx Eyes:     Pupils: Pupils are equal, round, and reactive to light.  Cardiovascular:     Rate and Rhythm: Normal rate and regular rhythm.  Pulmonary:     Effort: Pulmonary effort is normal. No respiratory distress.  Abdominal:     Palpations: Abdomen is soft.  Musculoskeletal:     Cervical back: Neck supple.  Skin:    General: Skin is warm and dry.  Neurological:     Mental Status: She is alert and oriented to person, place, and time.  Psychiatric:        Mood and Affect: Mood normal.    ED Results / Procedures / Treatments   Labs (all labs ordered are listed, but only abnormal results are displayed) Labs Reviewed - No data  to display  EKG None  Radiology No results found.  Procedures .Epistaxis Management  Date/Time: 03/26/2021 11:26 PM Performed by: Merryl Hacker, MD Authorized by: Merryl Hacker, MD   Consent:    Consent obtained:  Verbal   Consent given by:  Patient   Risks discussed:  Bleeding, nasal injury, pain and infection   Alternatives discussed:  No treatment Universal protocol:    Patient identity confirmed:  Verbally with patient Anesthesia:    Anesthesia method:  None Procedure details:    Treatment site:  L anterior   Treatment complexity:  Limited   Treatment episode: recurring   Post-procedure details:    Procedure completion:  Tolerated Comments:     Placed 5 additional cc in balloon   Medications Ordered in ED Medications  ondansetron (ZOFRAN-ODT) disintegrating tablet 4 mg (has no administration in time range)  acetaminophen (TYLENOL) tablet 1,000 mg (has no administration in time range)    ED Course  I have reviewed the triage vital signs and the nursing notes.  Pertinent labs & imaging results that were available during my care of the patient were reviewed by me and considered in my medical decision making (see chart for details).    MDM Rules/Calculators/A&P                           Patient presents with epistaxis.  Has an anterior pack.  No active bleeding noted on exam.  The balloon does appear to be somewhat soft and she tolerated 5 additional cc of inflation with minimal discomfort.  Do not feel an indication to remove packing at this time as she would likely rebleed more intensely.  Tylenol for pain.  Follow-up with ENT as planned.  After history, exam, and  medical workup I feel the patient has been appropriately medically screened and is safe for discharge home. Pertinent diagnoses were discussed with the patient. Patient was given return precautions.  Final Clinical Impression(s) / ED Diagnoses Final diagnoses:  Epistaxis    Rx / DC  Orders ED Discharge Orders          Ordered    ondansetron (ZOFRAN-ODT) 4 MG disintegrating tablet  Every 8 hours PRN        03/26/21 2323             ,  F, MD 03/26/21 2327  

## 2021-03-29 DIAGNOSIS — R040 Epistaxis: Secondary | ICD-10-CM | POA: Diagnosis not present

## 2021-03-29 DIAGNOSIS — M2669 Other specified disorders of temporomandibular joint: Secondary | ICD-10-CM | POA: Diagnosis not present

## 2021-03-29 DIAGNOSIS — Z7901 Long term (current) use of anticoagulants: Secondary | ICD-10-CM | POA: Diagnosis not present

## 2021-03-29 DIAGNOSIS — Z87891 Personal history of nicotine dependence: Secondary | ICD-10-CM | POA: Diagnosis not present

## 2021-03-29 DIAGNOSIS — H6121 Impacted cerumen, right ear: Secondary | ICD-10-CM | POA: Diagnosis not present

## 2021-03-29 DIAGNOSIS — H9203 Otalgia, bilateral: Secondary | ICD-10-CM | POA: Diagnosis not present

## 2021-04-27 ENCOUNTER — Telehealth: Payer: Self-pay | Admitting: Pharmacist

## 2021-04-27 NOTE — Chronic Care Management (AMB) (Signed)
Chronic Care Management Pharmacy Assistant   Name: [name]  MRN: [id number] DOB: [birth year]  Reason for Encounter: Disease State / Hypertension and Diabetes Assessment    Conditions to be addressed/monitored: HTN and DMII  Recent office visits:  None  Recent consult visits:  03/29/21 Skotnicki, Mirian Mo, DO (Otolaryngology) - Patient presented for Epistaxis. No medication changes.  Hospital visits:  Medication Reconciliation was completed by comparing discharge summary, patients EMR and Pharmacy list, and upon discussion with patient.  Patient presented to [place] ED on 03/26/21 due to Epistaxis. Patient was present for 1 hour.  New?Medications Started at [facility] Discharge:?? -started  ondansetron 4 MG  Medication Changes at Hospital Discharge: -Changed  None  Medications Discontinued at Hospital Discharge: -Stopped none  Medications that remain the same after Hospital Discharge:??  -All other medications will remain the same.    Hospital visits:  Medication Reconciliation was completed by comparing discharge summary, patients EMR and Pharmacy list, and upon discussion with patient.  Patient presented to [place] ED on 03/23/21 due to Epistaxis. Patient was present for 49 min.  New?Medications Started at [facility] Discharge:?? -started  none  Medication Changes at Hospital Discharge: -Changed  None  Medications Discontinued at Hospital Discharge: -Stopped none  Medications that remain the same after Hospital Discharge:??  -All other medications will remain the same.    Medications: Outpatient Encounter Medications as of 04/27/2021  Medication Sig   acetaminophen (TYLENOL) 325 MG tablet Take 325 mg by mouth 2 (two) times daily as needed for moderate pain or headache.   apixaban (ELIQUIS) 5 MG TABS tablet Take 1 tablet (5 mg total) by mouth 2 (two) times daily.   aspirin 81 MG EC tablet Take 81 mg by mouth every  evening.   atorvastatin (LIPITOR) 10 MG tablet Take 0.5 tablets (5 mg total) by mouth daily after supper.   Blood Glucose Monitoring Suppl (ONE TOUCH ULTRA 2) w/Device KIT Use as directed.   Calcium Carb-Cholecalciferol (CALCIUM 600+D3 PO) Take 1 tablet by mouth daily.   furosemide (LASIX) 40 MG tablet Take 1.5 tablets (60 mg total) by mouth daily. (Patient taking differently: Take 40 mg by mouth daily.)   glucose blood test strip 1 each by Other route as needed for other. Use as instructed with One Touch glucometer.   Magnesium Oxide 400 MG CAPS Take 1 capsule (400 mg total) by mouth daily.   metoprolol tartrate (LOPRESSOR) 100 MG tablet Take 0.5 tablets (50 mg total) by mouth 2 (two) times daily.   mometasone (ELOCON) 0.1 % lotion Apply topically daily.   Multiple Vitamin (MULTIVITAMIN WITH MINERALS) TABS tablet Take 1 tablet by mouth daily. One-A-Day   ondansetron (ZOFRAN-ODT) 4 MG disintegrating tablet Take 1 tablet (4 mg total) by mouth every 8 (eight) hours as needed for nausea or vomiting.   ramipril (ALTACE) 10 MG capsule Take 2 capsules (20 mg total) by mouth daily.   sertraline (ZOLOFT) 50 MG tablet Take 1 tablet (50 mg total) by mouth daily.   vitamin B-12 (CYANOCOBALAMIN) 500 MCG tablet Take 500 mcg by mouth daily.   vitamin E 400 UNIT capsule Take 400 Units by mouth daily.   No facility-administered encounter medications on file as of 04/27/2021.  Reviewed chart prior to disease state call. Spoke with patient regarding BP  Recent Office Vitals: BP Readings from Last 3 Encounters:  03/26/21 (!) 125/94  03/23/21 110/90  01/03/21 120/70   Pulse Readings from Last 3  Encounters:  03/26/21 (!) 116  03/23/21 89  01/03/21 61    Wt Readings from Last 3 Encounters:  03/26/21 213 lb 13.5 oz (97 kg)  03/23/21 214 lb 15.2 oz (97.5 kg)  10/03/20 215 lb (97.5 kg)     Kidney Function Lab Results  Component Value Date/Time   CREATININE 0.83 07/19/2020 11:15 AM   CREATININE 0.86  09/08/2019 01:00 PM   CREATININE 0.57 (L) 03/22/2017 04:44 PM   CREATININE 0.76 01/23/2016 01:11 PM   GFR 66.77 06/24/2019 03:55 PM   GFRNONAA 61 09/08/2019 01:00 PM   GFRAA 70 09/08/2019 01:00 PM    BMP Latest Ref Rng & Units 07/19/2020 09/08/2019 08/27/2019  Glucose 65 - 99 mg/dL 85 103(H) 197(H)  BUN 8 - 27 mg/dL 25 31(H) 14  Creatinine 0.57 - 1.00 mg/dL 0.83 0.86 0.69  BUN/Creat Ratio 12 - 28 30(H) 36(H) 20  Sodium 134 - 144 mmol/L 140 145(H) 141  Potassium 3.5 - 5.2 mmol/L 4.5 5.0 4.7  Chloride 96 - 106 mmol/L 98 102 101  CO2 20 - 29 mmol/L '25 28 26  ' Calcium 8.7 - 10.3 mg/dL 10.1 10.0 9.4    Current antihypertensive regimen:  Furosemide 40 mg 1.5 tablets daily (take 40 mg daily) Metoprolol tartrate 100 mg 1/2 tablet twice daily  Ramipril 10 mg 2 capsule daily  How often are you checking your Blood Pressure? weekly BP Readings from Last 3 Encounters:  03/26/21 (!) 125/94  03/23/21 110/90  01/03/21 120/70   Current home BP readings: Patient reports she has an aide whom checks it for her when she comes so she is not sure of the readings, denies any dizziness, headache or lightheadedness last reading on 12/4 at hospital was 106/68 What recent interventions/DTPs have been made by any provider to improve Blood Pressure control since last CPP Visit: Patient reports no changes Any recent hospitalizations or ED visits since last visit with CPP? Yes What diet changes have been made to improve Blood Pressure Control?  Patient reports she has a little candy every now and then but trying to watch the salt. What exercise is being done to improve your Blood Pressure Control?  Patient reports she is moving around best she can inside  Adherence Review: Is the patient currently on ACE/ARB medication? Yes Does the patient have >5 day gap between last estimated fill dates? No   Recent Relevant Labs: Lab Results  Component Value Date/Time   HGBA1C 6.5 (A) 01/03/2021 08:48 AM   HGBA1C 6.5  (H) 07/19/2020 11:15 AM   HGBA1C 6.3 (A) 01/08/2020 12:16 PM   HGBA1C 6.6 (H) 07/14/2019 08:54 AM    Kidney Function Lab Results  Component Value Date/Time   CREATININE 0.83 07/19/2020 11:15 AM   CREATININE 0.86 09/08/2019 01:00 PM   CREATININE 0.57 (L) 03/22/2017 04:44 PM   CREATININE 0.76 01/23/2016 01:11 PM   GFR 66.77 06/24/2019 03:55 PM   GFRNONAA 61 09/08/2019 01:00 PM   GFRAA 70 09/08/2019 01:00 PM   04/27/21 Current antihyperglycemic regimen:  None What recent interventions/DTPs have been made to improve glycemic control:  Patient reports none, she advised she had not been checking her blood sugars as she thinks the machine needs to be re-calibrated and has not had her aide take it to the pharmacy as she forgets, advised her to place it with her medications so she will see it and remember when the aide comes to give it to her and that I would check back in a few  weeks to see how it went, she was in agreement. Have there been any recent hospitalizations or ED visits since last visit with CPP? No  05/15/21 Call to patient to follow up on her sugars Lorette Ang, she reports she still has not checked either advised she will have her aide do so for her on tomorrow and call me back if she is able to do so.  Care Gaps: AWV - 4/23 COVID Vaccines - Overdue Foot exam - Overdue Eye exam - Overdue TDAP - Overdue Zoster Vaccines - Overdue PNA Vaccine - Overdue BP-108/68 (03/26/21 Hospital)  Star Rating Drugs: Atorvastatin (Lipitor) 10 mg - last filled on 03/04/21 90DS at [facility] Ramipril (Altace) 10 mg - last filled on 02/26/21 90DS at [facility] Pharmacist Assistant [phone]

## 2021-05-22 DIAGNOSIS — H903 Sensorineural hearing loss, bilateral: Secondary | ICD-10-CM | POA: Diagnosis not present

## 2021-07-20 ENCOUNTER — Other Ambulatory Visit: Payer: Self-pay | Admitting: *Deleted

## 2021-07-20 DIAGNOSIS — I4821 Permanent atrial fibrillation: Secondary | ICD-10-CM

## 2021-07-20 MED ORDER — ATORVASTATIN CALCIUM 10 MG PO TABS: 5.0000 mg | ORAL_TABLET | Freq: Every day | ORAL | 3 refills | Status: DC

## 2021-07-20 MED ORDER — METOPROLOL TARTRATE 100 MG PO TABS: 50.0000 mg | ORAL_TABLET | Freq: Two times a day (BID) | ORAL | 2 refills | Status: DC

## 2021-07-20 MED ORDER — APIXABAN 5 MG PO TABS: 5.0000 mg | ORAL_TABLET | Freq: Two times a day (BID) | ORAL | 0 refills | Status: DC

## 2021-07-20 NOTE — Telephone Encounter (Signed)
Eliquis 5mg  refill request received. Patient is 88 years old, weight-97kg, Crea-0.83 on 07/19/2020-NEEDS UPDATED LABS, Diagnosis-Afib, and last seen by 07/21/2020 on 07/19/2020 and pending appt on 08/01/2021 with 10/01/2021. Dose is appropriate based on dosing criteria. Will send in refill to requested pharmacy.   ? ?Will place lab order for when she has her appt with Otilio Saber on 08/01/2021.  ?

## 2021-07-27 NOTE — Progress Notes (Signed)
? ?PCP:  Eulas Post, MD ?Primary Cardiologist: None ?Electrophysiologist: Thompson Grayer, MD  ? ?[name] is a 88 y.o. female seen today for Thompson Grayer, MD for routine electrophysiology followup.  Since last being seen in our clinic the patient reports doing well overall.  she denies chest pain, palpitations, dyspnea, PND, orthopnea, nausea, vomiting, dizziness, syncope, edema, weight gain, or early satiety. ? ?Past Medical History:  ?Diagnosis Date  ? Anemia   ? CAD 09/24/2008  ? AMI in 1998 tx with PCI to LAD;  myoview 1/12:  no ischemia, EF 45%  ? CARDIOMYOPATHY 09/24/2008  ? ischemic;  echo 4/12:  EF 20-25%, mild MR, mod LAE, mod reduced RVSF, mod RVE, mild RAE, mild TR, PASP 40  ? CAROTID BRUIT 09/24/2008  ? DIABETES MELLITUS 09/24/2008  ? Edema 09/24/2008  ? GERD (gastroesophageal reflux disease)   ? HYPERLIPIDEMIA 09/24/2008  ? HYPERTENSION 09/24/2008  ? MI 09/24/2008  ? Osteopenia   ? Persistent atrial fibrillation ([place]) 09/24/2008  ? s/p DCCV x 4 in past;  Amiod d/c'd 2/2 abnormal PFTs;  Tikosyn load 6/12 with DCCV  ? Tachycardia-bradycardia [facility])   ? ?Past Surgical History:  ?Procedure Laterality Date  ? CARDIOVERSION N/A 02/03/2015  ? Procedure: CARDIOVERSION;  Surgeon: Dorothy Spark, MD;  Location: [place];  Service: Cardiovascular;  Laterality: N/A;  ? CARDIOVERSION N/A 07/08/2018  ? Procedure: CARDIOVERSION;  Surgeon: Thayer Headings, MD;  Location: [facility] ENDOSCOPY;  Service: Cardiovascular;  Laterality: N/A;  getting INR prior  ? ELECTROPHYSIOLOGIC STUDY N/A 10/15/2014  ? PVI and CTI ablation by Dr Rayann Heman  ? HIP FRACTURE SURGERY  2006  ? KNEE SURGERY  2008  ? broken knee  ? LOOP RECORDER IMPLANT N/A 11/06/2012  ? Procedure: LOOP RECORDER IMPLANT;  Surgeon: Thompson Grayer, MD;  Location: [facility] CATH LAB;  Service: Cardiovascular;  Laterality: N/A;Medtronic LinQ implanted by Dr Rayann Heman for palpitaitons and dizziness  ? TEE WITHOUT CARDIOVERSION N/A 10/15/2014  ? Procedure: TRANSESOPHAGEAL ECHOCARDIOGRAM (TEE);   Surgeon: Thayer Headings, MD;  Location: [place];  Service: Cardiovascular;  Laterality: N/A;  ? ? ?Current Outpatient Medications  ?Medication Sig Dispense Refill  ? acetaminophen (TYLENOL) 325 MG tablet Take 325 mg by mouth 2 (two) times daily as needed for moderate pain or headache.    ? ammonium lactate (LAC-HYDRIN) 12 % lotion 1 application    ? apixaban (ELIQUIS) 5 MG TABS tablet Take 1 tablet (5 mg total) by mouth 2 (two) times daily. 180 tablet 0  ? atorvastatin (LIPITOR) 10 MG tablet Take 0.5 tablets (5 mg total) by mouth daily after supper. 45 tablet 3  ? Blood Glucose Monitoring Suppl (ONE TOUCH ULTRA 2) w/Device KIT Use as directed. 1 kit 0  ? Calcium Carb-Cholecalciferol (CALCIUM 600+D3 PO) Take 1 tablet by mouth daily.    ? glucose blood test strip 1 each by Other route as needed for other. Use as instructed with One Touch glucometer. 100 each 11  ? Magnesium Oxide 400 MG CAPS Take 1 capsule (400 mg total) by mouth daily. 90 capsule 3  ? metoprolol tartrate (LOPRESSOR) 100 MG tablet Take 0.5 tablets (50 mg total) by mouth 2 (two) times daily. 90 tablet 2  ? Multiple Vitamin (MULTIVITAMIN WITH MINERALS) TABS tablet Take 1 tablet by mouth daily. One-A-Day    ? ramipril (ALTACE) 10 MG capsule Take 2 capsules (20 mg total) by mouth daily. 180 capsule 1  ? sertraline (ZOLOFT) 50 MG tablet Take 1 tablet (50  mg total) by mouth daily. 90 tablet 3  ? vitamin B-12 (CYANOCOBALAMIN) 500 MCG tablet Take 500 mcg by mouth daily.    ? vitamin E 400 UNIT capsule Take 400 Units by mouth daily.    ? aspirin 81 MG EC tablet Take 81 mg by mouth every evening. (Patient not taking: Reported on 08/01/2021)    ? furosemide (LASIX) 40 MG tablet Take 1.5 tablets (60 mg total) by mouth daily. (Patient taking differently: Take 40 mg by mouth daily.) 180 tablet 3  ? mometasone (ELOCON) 0.1 % lotion Apply topically daily. (Patient not taking: Reported on 08/01/2021) 60 mL 1  ? ondansetron (ZOFRAN-ODT) 4 MG disintegrating tablet  Take 1 tablet (4 mg total) by mouth every 8 (eight) hours as needed for nausea or vomiting. (Patient not taking: Reported on 08/01/2021) 20 tablet 0  ? ?No current facility-administered medications for this visit.  ? ? ?Allergies  ?Allergen Reactions  ? Simvastatin Rash  ? ? ?Social History  ? ?Socioeconomic History  ? Marital status: Widowed  ?  Spouse name: Not on file  ? Number of children: Not on file  ? Years of education: Not on file  ? Highest education level: Not on file  ?Occupational History  ? Not on file  ?Tobacco Use  ? Smoking status: Former  ?  Packs/day: 0.50  ?  Years: 20.00  ?  Pack years: 10.00  ?  Types: Cigarettes  ?  Quit date: 07/11/1996  ?  Years since quitting: 25.0  ?  Passive exposure: Past  ? Smokeless tobacco: Never  ?Vaping Use  ? Vaping Use: Not on file  ?Substance and Sexual Activity  ? Alcohol use: No  ? Drug use: No  ? Sexual activity: Not Currently  ?Other Topics Concern  ? Not on file  ?Social History Narrative  ? Not on file  ? ?Social Determinants of Health  ? ?Financial Resource Strain: Not on file  ?Food Insecurity: Not on file  ?Transportation Needs: Not on file  ?Physical Activity: Not on file  ?Stress: Not on file  ?Social Connections: Not on file  ?Intimate Partner Violence: Not on file  ? ? ? ?Review of Systems: ?All other systems reviewed and are otherwise negative except as noted above. ? ?Physical Exam: ?Vitals:  ? 08/01/21 1032  ?BP: 120/80  ?Pulse: 73  ?SpO2: 95%  ?Weight: 206 lb (93.4 kg)  ?Height: '5\' 5"'  (1.651 m)  ? ? ?GEN- The patient is well appearing, alert and oriented x 3 today.   ?HEENT: normocephalic, atraumatic; sclera clear, conjunctiva pink; hearing intact; oropharynx clear; neck supple, no JVP ?Lymph- no cervical lymphadenopathy ?Lungs- Clear to ausculation bilaterally, normal work of breathing.  No wheezes, rales, rhonchi ?Heart- Regular rate and rhythm, no murmurs, rubs or gallops, PMI not laterally displaced ?GI- soft, non-tender, non-distended, bowel  sounds present, no hepatosplenomegaly ?Extremities- no clubbing, cyanosis, or edema; DP/PT/radial pulses 2+ bilaterally ?MS- no significant deformity or atrophy ?Skin- warm and dry, no rash or lesion ?Psych- euthymic mood, full affect ?Neuro- strength and sensation are intact ? ?EKG is ordered. Personal review of EKG from today shows AF at 73 bpm ? ?Additional studies reviewed include: ?Previous EP office notes.  ? ?Assessment and Plan: ? ?1. Longstanding persistent AF ?Continue eliquis for CHA2DS2VASC of at least 7. Denies bleeding.  ?Continue rate control with lopressor 50 mg BID.  ?  ?2. Chronic systolic CHF ?TEE in 2016 showed LVEF 20-25% ?NYHA III symptoms (chronic)  ?Volume status  stable on exam  ?Continue lasix 40 mg daily; sliding scale as needed.  ?Off potassium with borderline high K in the past. For labs today.  ?Continue ramipril 20 mg daily ?Continue lopressor 50 mg BID ?Would not challenge with spiro given labile K.  ?No indication for ICD for primary prevention in her age group.  ?  ?3. HTN ?Continue current management.  ?  ?4. Chronic venous insufficiency ?Suspect component of lymphedema, overall stable. ?Reinforced compression hose and salt/fluid restriction.  ?  ?5. DM2 ?Per Dr. Elease Hashimoto.  ? ?Follow up with EP APP in 12 months.  ? ?Shirley Friar, PA-C  ?08/01/21 ?10:54 AM ? ?

## 2021-08-01 ENCOUNTER — Ambulatory Visit: Payer: PPO | Admitting: Student

## 2021-08-01 ENCOUNTER — Other Ambulatory Visit: Payer: PPO | Admitting: *Deleted

## 2021-08-01 ENCOUNTER — Encounter: Payer: Self-pay | Admitting: Student

## 2021-08-01 VITALS — BP 120/80 | HR 73 | Ht 65.0 in | Wt 206.0 lb

## 2021-08-01 DIAGNOSIS — I10 Essential (primary) hypertension: Secondary | ICD-10-CM

## 2021-08-01 DIAGNOSIS — I5022 Chronic systolic (congestive) heart failure: Secondary | ICD-10-CM | POA: Diagnosis not present

## 2021-08-01 DIAGNOSIS — I4811 Longstanding persistent atrial fibrillation: Secondary | ICD-10-CM | POA: Diagnosis not present

## 2021-08-01 DIAGNOSIS — I4821 Permanent atrial fibrillation: Secondary | ICD-10-CM

## 2021-08-01 LAB — CBC
Hematocrit: 42.1 % (ref 34.0–46.6)
Hemoglobin: 14 g/dL (ref 11.1–15.9)
MCH: 29.4 pg (ref 26.6–33.0)
MCHC: 33.3 g/dL (ref 31.5–35.7)
MCV: 88 fL (ref 79–97)
Platelets: 302 10*3/uL (ref 150–450)
RBC: 4.76 x10E6/uL (ref 3.77–5.28)
RDW: 13.4 % (ref 11.7–15.4)
WBC: 10 10*3/uL (ref 3.4–10.8)

## 2021-08-01 LAB — BASIC METABOLIC PANEL
BUN/Creatinine Ratio: 34 — ABNORMAL HIGH (ref 12–28)
BUN: 27 mg/dL (ref 8–27)
CO2: 28 mmol/L (ref 20–29)
Calcium: 9.8 mg/dL (ref 8.7–10.3)
Chloride: 101 mmol/L (ref 96–106)
Creatinine, Ser: 0.79 mg/dL (ref 0.57–1.00)
Glucose: 106 mg/dL — ABNORMAL HIGH (ref 70–99)
Potassium: 5 mmol/L (ref 3.5–5.2)
Sodium: 140 mmol/L (ref 134–144)
eGFR: 71 mL/min/{1.73_m2} (ref >59)

## 2021-08-01 NOTE — Patient Instructions (Signed)
Medication Instructions:  ?Your physician recommends that you continue on your current medications as directed. Please refer to the Current Medication list given to you today. ? ?*If you need a refill on your cardiac medications before your next appointment, please call your pharmacy* ? ? ?Lab Work: ?TODAY: BMET, CBC ? ?If you have labs (blood work) drawn today and your tests are completely normal, you will receive your results only by: ?MyChart Message (if you have MyChart) OR ?A paper copy in the mail ?If you have any lab test that is abnormal or we need to change your treatment, we will call you to review the results. ? ? ?Follow-Up: ?At [facility], you and your health needs are our priority.  As part of our continuing mission to provide you with exceptional heart care, we have created designated Provider Care Teams.  These Care Teams include your primary Cardiologist (physician) and Advanced Practice Providers (APPs -  Physician Assistants and Nurse Practitioners) who all work together to provide you with the care you need, when you need it. ? ?We recommend signing up for the patient portal called "MyChart".  Sign up information is provided on this After Visit Summary.  MyChart is used to connect with patients for Virtual Visits (Telemedicine).  Patients are able to view lab/test results, encounter notes, upcoming appointments, etc.  Non-urgent messages can be sent to your provider as well.   ?To learn more about what you can do with MyChart, go to [email or web address].   ? ?Your next appointment:   ?1 year(s) ? ?The format for your next appointment:   ?In Person ? ?Provider:   ?Casimiro Needle "Mardelle Matte" [place], PA-C  ? ? ?Important Information About Sugar ? ? ? ? ?  ?

## 2021-08-02 ENCOUNTER — Ambulatory Visit: Payer: PPO

## 2021-08-02 ENCOUNTER — Ambulatory Visit (INDEPENDENT_AMBULATORY_CARE_PROVIDER_SITE_OTHER): Payer: PPO

## 2021-08-02 VITALS — BP 120/80 | Ht 65.0 in | Wt 206.0 lb

## 2021-08-02 DIAGNOSIS — Z0000 Encounter for general adult medical examination without abnormal findings: Secondary | ICD-10-CM | POA: Diagnosis not present

## 2021-08-02 NOTE — Progress Notes (Signed)
? ?Subjective:  ? [name] is a 88 y.o. female who presents for Medicare Annual (Subsequent) preventive examination. ? ?Review of Systems    ?Virtual Visit via Telephone Note ? ?I connected with  [name] on 08/02/21 at 11:30 AM EDT by telephone and verified that I am speaking with the correct person using two identifiers. ? ?Location: ?Patient: Home ?Provider: Office ?Persons participating in the virtual visit: patient/Nurse Health Advisor ?  ?I discussed the limitations, risks, security and privacy concerns of performing an evaluation and management service by telephone and the availability of in person appointments. The patient expressed understanding and agreed to proceed. ? ?Interactive audio and video telecommunications were attempted between this nurse and patient, however failed, due to patient having technical difficulties OR patient did not have access to video capability.  We continued and completed visit with audio only. ? ?Some vital signs may be absent or patient reported.  ? ?Criselda Peaches, LPN  ?Cardiac Risk Factors include: advanced age (>73mn, >>65women);hypertension ? ?   ?Objective:  ?  ?Today's Vitals  ? 08/02/21 1135  ?BP: 120/80  ?Weight: 206 lb (93.4 kg)  ?Height: _0  (1.651 m)  ? ?Body mass index is 34.28 kg/m?. ? ? ?  08/02/2021  ? 11:48 AM 03/23/2021  ?  7:39 AM 10/03/2020  ?  3:42 PM 07/27/2020  ? 10:24 AM 12/27/2018  ?  1:53 PM 07/08/2018  ? 10:46 AM 03/13/2018  ? 11:44 AM  ?Advanced Directives  ?Does Patient Have a Medical Advance Directive? Yes No No Yes No No No  ?Type of Advance Directive Living will;[place]    ?Does patient want to make changes to medical advance directive? No - Patient declined        ?Copy of [place]?    No - copy requested     ?Would patient like information on creating a medical advance directive? No - Patient declined  No - Patient declined  No - Patient declined No -  Patient declined No - Patient declined  ? ? ?Current Medications (verified) ?Outpatient Encounter Medications as of 08/02/2021  ?Medication Sig  ? acetaminophen (TYLENOL) 325 MG tablet Take 325 mg by mouth 2 (two) times daily as needed for moderate pain or headache.  ? ammonium lactate (LAC-HYDRIN) 12 % lotion 1 application  ? apixaban (ELIQUIS) 5 MG TABS tablet Take 1 tablet (5 mg total) by mouth 2 (two) times daily.  ? aspirin 81 MG EC tablet Take 81 mg by mouth every evening. (Patient not taking: Reported on 08/01/2021)  ? atorvastatin (LIPITOR) 10 MG tablet Take 0.5 tablets (5 mg total) by mouth daily after supper.  ? Blood Glucose Monitoring Suppl (ONE TOUCH ULTRA 2) w/Device KIT Use as directed.  ? Calcium Carb-Cholecalciferol (CALCIUM 600+D3 PO) Take 1 tablet by mouth daily.  ? furosemide (LASIX) 40 MG tablet Take 1.5 tablets (60 mg total) by mouth daily. (Patient taking differently: Take 40 mg by mouth daily.)  ? glucose blood test strip 1 each by Other route as needed for other. Use as instructed with One Touch glucometer.  ? Magnesium Oxide 400 MG CAPS Take 1 capsule (400 mg total) by mouth daily.  ? metoprolol tartrate (LOPRESSOR) 100 MG tablet Take 0.5 tablets (50 mg total) by mouth 2 (two) times daily.  ? mometasone (ELOCON) 0.1 % lotion Apply topically daily. (Patient not taking: Reported on 08/01/2021)  ?  Multiple Vitamin (MULTIVITAMIN WITH MINERALS) TABS tablet Take 1 tablet by mouth daily. One-A-Day  ? ondansetron (ZOFRAN-ODT) 4 MG disintegrating tablet Take 1 tablet (4 mg total) by mouth every 8 (eight) hours as needed for nausea or vomiting. (Patient not taking: Reported on 08/01/2021)  ? ramipril (ALTACE) 10 MG capsule Take 2 capsules (20 mg total) by mouth daily.  ? sertraline (ZOLOFT) 50 MG tablet Take 1 tablet (50 mg total) by mouth daily.  ? vitamin B-12 (CYANOCOBALAMIN) 500 MCG tablet Take 500 mcg by mouth daily.  ? vitamin E 400 UNIT capsule Take 400 Units by mouth daily.  ? ?No  facility-administered encounter medications on file as of 08/02/2021.  ? ? ?Allergies (verified) ?Simvastatin  ? ?History: ?Past Medical History:  ?Diagnosis Date  ? Anemia   ? CAD 09/24/2008  ? AMI in 1998 tx with PCI to LAD;  myoview 1/12:  no ischemia, EF 45%  ? CARDIOMYOPATHY 09/24/2008  ? ischemic;  echo 4/12:  EF 20-25%, mild MR, mod LAE, mod reduced RVSF, mod RVE, mild RAE, mild TR, PASP 40  ? CAROTID BRUIT 09/24/2008  ? DIABETES MELLITUS 09/24/2008  ? Edema 09/24/2008  ? GERD (gastroesophageal reflux disease)   ? HYPERLIPIDEMIA 09/24/2008  ? HYPERTENSION 09/24/2008  ? MI 09/24/2008  ? Osteopenia   ? Persistent atrial fibrillation ([place]) 09/24/2008  ? s/p DCCV x 4 in past;  Amiod d/c'd 2/2 abnormal PFTs;  Tikosyn load 6/12 with DCCV  ? Tachycardia-bradycardia [facility])   ? ?Past Surgical History:  ?Procedure Laterality Date  ? CARDIOVERSION N/A 02/03/2015  ? Procedure: CARDIOVERSION;  Surgeon: Dorothy Spark, MD;  Location: [place];  Service: Cardiovascular;  Laterality: N/A;  ? CARDIOVERSION N/A 07/08/2018  ? Procedure: CARDIOVERSION;  Surgeon: Thayer Headings, MD;  Location: [facility] ENDOSCOPY;  Service: Cardiovascular;  Laterality: N/A;  getting INR prior  ? ELECTROPHYSIOLOGIC STUDY N/A 10/15/2014  ? PVI and CTI ablation by Dr Rayann Heman  ? HIP FRACTURE SURGERY  2006  ? KNEE SURGERY  2008  ? broken knee  ? LOOP RECORDER IMPLANT N/A 11/06/2012  ? Procedure: LOOP RECORDER IMPLANT;  Surgeon: Thompson Grayer, MD;  Location: [facility] CATH LAB;  Service: Cardiovascular;  Laterality: N/A;Medtronic LinQ implanted by Dr Rayann Heman for palpitaitons and dizziness  ? TEE WITHOUT CARDIOVERSION N/A 10/15/2014  ? Procedure: TRANSESOPHAGEAL ECHOCARDIOGRAM (TEE);  Surgeon: Thayer Headings, MD;  Location: [place];  Service: Cardiovascular;  Laterality: N/A;  ? ?Family History  ?Problem Relation Age of Onset  ? Breast cancer Mother   ? Heart disease Mother   ? Diabetes Mother   ? Alcohol abuse Father   ? Breast cancer Sister   ? Brain cancer Sister   ?      cause of death at 44 years old  ? Alcohol abuse Brother   ? Heart disease Brother   ? Heart disease Brother   ? Cancer Brother   ?     unknown type  ? Coronary artery disease Other   ? Hypertension Other   ? Heart attack Brother   ? Heart attack Brother   ? ?Social History  ? ?Socioeconomic History  ? Marital status: Widowed  ?  Spouse name: Not on file  ? Number of children: Not on file  ? Years of education: Not on file  ? Highest education level: Not on file  ?Occupational History  ? Not on file  ?Tobacco Use  ? Smoking status: Former  ?  Packs/day: 0.50  ?  Years: 20.00  ?  Pack years: 10.00  ?  Types: Cigarettes  ?  Quit date: 07/11/1996  ?  Years since quitting: 25.0  ?  Passive exposure: Past  ? Smokeless tobacco: Never  ?Vaping Use  ? Vaping Use: Not on file  ?Substance and Sexual Activity  ? Alcohol use: No  ? Drug use: No  ? Sexual activity: Not Currently  ?Other Topics Concern  ? Not on file  ?Social History Narrative  ? Not on file  ? ?Social Determinants of Health  ? ?Financial Resource Strain: Low Risk   ? Difficulty of Paying Living Expenses: Not hard at all  ?Food Insecurity: No Food Insecurity  ? Worried About Charity fundraiser in the Last Year: Never true  ? Ran Out of Food in the Last Year: Never true  ?Transportation Needs: No Transportation Needs  ? Lack of Transportation (Medical): No  ? Lack of Transportation (Non-Medical): No  ?Physical Activity: Inactive  ? Days of Exercise per Week: 0 days  ? Minutes of Exercise per Session: 0 min  ?Stress: No Stress Concern Present  ? Feeling of Stress : Not at all  ?Social Connections: Moderately Integrated  ? Frequency of Communication with Friends and Family: More than three times a week  ? Frequency of Social Gatherings with Friends and Family: More than three times a week  ? Attends Religious Services: More than 4 times per year  ? Active Member of Clubs or Organizations: Yes  ? Attends Archivist Meetings: More than 4 times per year  ?  Marital Status: Widowed  ? ? ?Tobacco Counseling ?Counseling given: Not Answered ? ? ?Clinical Intake: ? ?Pre-visit preparation completed: No ?Diabetic?  No ? ? ?Activities of Daily Living ? ?  08/02/2021  ? 11:45 AM  ?In you

## 2021-08-02 NOTE — Patient Instructions (Addendum)
?[name] , ?Thank you for taking time to come for your Medicare Wellness Visit. I appreciate your ongoing commitment to your health goals. Please review the following plan we discussed and let me know if I can assist you in the future.  ? ?These are the goals we discussed: ? Goals   ? ?   Patient Stated (pt-stated)   ?   I would love to Walk more.  ?  ? ?  ?  ?This is a list of the screening recommended for you and due dates:  ?Health Maintenance  ?Topic Date Due  ? Complete foot exam   Never done  ? Eye exam for diabetics  Never done  ? Hemoglobin A1C  07/03/2021  ? COVID-19 Vaccine (1) 08/18/2021*  ? Zoster (Shingles) Vaccine (1 of 2) 11/01/2021*  ? Pneumonia Vaccine (2 - PPSV23 if available, else PCV20) 08/03/2022*  ? DEXA scan (bone density measurement)  08/03/2022*  ? Tetanus Vaccine  08/03/2022*  ? Flu Shot  11/21/2021  ? HPV Vaccine  Aged Out  ?*Topic was postponed. The date shown is not the original due date.  ? ?Advanced directives: Yes ? ?Conditions/risks identified: None ? ?Next appointment: Follow up in one year for your annual wellness visit  ? ? ?Preventive Care 35 Years and Older, Female ?Preventive care refers to lifestyle choices and visits with your health care provider that can promote health and wellness. ?What does preventive care include? ?A yearly physical exam. This is also called an annual well check. ?Dental exams once or twice a year. ?Routine eye exams. Ask your health care provider how often you should have your eyes checked. ?Personal lifestyle choices, including: ?Daily care of your teeth and gums. ?Regular physical activity. ?Eating a healthy diet. ?Avoiding tobacco and drug use. ?Limiting alcohol use. ?Practicing safe sex. ?Taking low-dose aspirin every day. ?Taking vitamin and mineral supplements as recommended by your health care provider. ?What happens during an annual well check? ?The services and screenings done by your health care provider during your annual well check will  depend on your age, overall health, lifestyle risk factors, and family history of disease. ?Counseling  ?Your health care provider may ask you questions about your: ?Alcohol use. ?Tobacco use. ?Drug use. ?Emotional well-being. ?Home and relationship well-being. ?Sexual activity. ?Eating habits. ?History of falls. ?Memory and ability to understand (cognition). ?Work and work Astronomer. ?Reproductive health. ?Screening  ?You may have the following tests or measurements: ?Height, weight, and BMI. ?Blood pressure. ?Lipid and cholesterol levels. These may be checked every 5 years, or more frequently if you are over 24 years old. ?Skin check. ?Lung cancer screening. You may have this screening every year starting at age 57 if you have a 30-pack-year history of smoking and currently smoke or have quit within the past 15 years. ?Fecal occult blood test (FOBT) of the stool. You may have this test every year starting at age 68. ?Flexible sigmoidoscopy or colonoscopy. You may have a sigmoidoscopy every 5 years or a colonoscopy every 10 years starting at age 19. ?Hepatitis C blood test. ?Hepatitis B blood test. ?Sexually transmitted disease (STD) testing. ?Diabetes screening. This is done by checking your blood sugar (glucose) after you have not eaten for a while (fasting). You may have this done every 1-3 years. ?Bone density scan. This is done to screen for osteoporosis. You may have this done starting at age 5. ?Mammogram. This may be done every 1-2 years. Talk to your health care provider about how  often you should have regular mammograms. ?Talk with your health care provider about your test results, treatment options, and if necessary, the need for more tests. ?Vaccines  ?Your health care provider may recommend certain vaccines, such as: ?Influenza vaccine. This is recommended every year. ?Tetanus, diphtheria, and acellular pertussis (Tdap, Td) vaccine. You may need a Td booster every 10 years. ?Zoster vaccine. You may  need this after age 59. ?Pneumococcal 13-valent conjugate (PCV13) vaccine. One dose is recommended after age 66. ?Pneumococcal polysaccharide (PPSV23) vaccine. One dose is recommended after age 6. ?Talk to your health care provider about which screenings and vaccines you need and how often you need them. ?This information is not intended to replace advice given to you by your health care provider. Make sure you discuss any questions you have with your health care provider. ?Document Released: 05/06/2015 Document Revised: 12/28/2015 Document Reviewed: 02/08/2015 ?Elsevier Interactive Patient Education ? 2017 Elsevier Inc. ? ?Fall Prevention in the Home ?Falls can cause injuries. They can happen to people of all ages. There are many things you can do to make your home safe and to help prevent falls. ?What can I do on the outside of my home? ?Regularly fix the edges of walkways and driveways and fix any cracks. ?Remove anything that might make you trip as you walk through a door, such as a raised step or threshold. ?Trim any bushes or trees on the path to your home. ?Use bright outdoor lighting. ?Clear any walking paths of anything that might make someone trip, such as rocks or tools. ?Regularly check to see if handrails are loose or broken. Make sure that both sides of any steps have handrails. ?Any raised decks and porches should have guardrails on the edges. ?Have any leaves, snow, or ice cleared regularly. ?Use sand or salt on walking paths during winter. ?Clean up any spills in your garage right away. This includes oil or grease spills. ?What can I do in the bathroom? ?Use night lights. ?Install grab bars by the toilet and in the tub and shower. Do not use towel bars as grab bars. ?Use non-skid mats or decals in the tub or shower. ?If you need to sit down in the shower, use a plastic, non-slip stool. ?Keep the floor dry. Clean up any water that spills on the floor as soon as it happens. ?Remove soap buildup in  the tub or shower regularly. ?Attach bath mats securely with double-sided non-slip rug tape. ?Do not have throw rugs and other things on the floor that can make you trip. ?What can I do in the bedroom? ?Use night lights. ?Make sure that you have a light by your bed that is easy to reach. ?Do not use any sheets or blankets that are too big for your bed. They should not hang down onto the floor. ?Have a firm chair that has side arms. You can use this for support while you get dressed. ?Do not have throw rugs and other things on the floor that can make you trip. ?What can I do in the kitchen? ?Clean up any spills right away. ?Avoid walking on wet floors. ?Keep items that you use a lot in easy-to-reach places. ?If you need to reach something above you, use a strong step stool that has a grab bar. ?Keep electrical cords out of the way. ?Do not use floor polish or wax that makes floors slippery. If you must use wax, use non-skid floor wax. ?Do not have throw rugs and other things  on the floor that can make you trip. ?What can I do with my stairs? ?Do not leave any items on the stairs. ?Make sure that there are handrails on both sides of the stairs and use them. Fix handrails that are broken or loose. Make sure that handrails are as long as the stairways. ?Check any carpeting to make sure that it is firmly attached to the stairs. Fix any carpet that is loose or worn. ?Avoid having throw rugs at the top or bottom of the stairs. If you do have throw rugs, attach them to the floor with carpet tape. ?Make sure that you have a light switch at the top of the stairs and the bottom of the stairs. If you do not have them, ask someone to add them for you. ?What else can I do to help prevent falls? ?Wear shoes that: ?Do not have high heels. ?Have rubber bottoms. ?Are comfortable and fit you well. ?Are closed at the toe. Do not wear sandals. ?If you use a stepladder: ?Make sure that it is fully opened. Do not climb a closed  stepladder. ?Make sure that both sides of the stepladder are locked into place. ?Ask someone to hold it for you, if possible. ?Clearly mark and make sure that you can see: ?Any grab bars or handrails. ?First and

## 2021-08-08 DIAGNOSIS — H35363 Drusen (degenerative) of macula, bilateral: Secondary | ICD-10-CM | POA: Diagnosis not present

## 2021-08-22 ENCOUNTER — Telehealth: Payer: Self-pay | Admitting: Pharmacist

## 2021-08-22 NOTE — Chronic Care Management (AMB) (Signed)
Chronic Care Management Pharmacy Assistant   Name: [name]  MRN: [id number] DOB: 12/03/1929  Reason for Encounter: Disease State   Conditions to be addressed/monitored: DMII  Recent office visits:  08/02/21 Criselda Peaches, LPN - Patient presented for [facility] Annual Wellness exam. No medication changes. Patient voiced goal of walking more.  Recent consult visits:  08/01/21 Shirley Friar, PA-C (Cardiology) - Patient presented for Chronic systolic congestive heart failure and other concerns. No medication changes.  05/22/21 Lorrene Reid (Audiology) - Patient presented to [facility] Audiology for Sensorineural hearing loss bilateral. No medication changes.  Hospital visits:  Medication Reconciliation was completed by comparing discharge summary, patient's EMR and Pharmacy list, and upon discussion with patient.   Patient presented to [place] ED on 03/26/21 due to Epistaxis. Patient was present for 1 hour.   New?Medications Started at [facility] Discharge:?? -started  ondansetron 4 MG   Medication Changes at Hospital Discharge: -Changed  None   Medications Discontinued at Hospital Discharge: -Stopped none   Medications that remain the same after Hospital Discharge:??  -All other medications will remain the same.     Hospital visits:  Medication Reconciliation was completed by comparing discharge summary, patient's EMR and Pharmacy list, and upon discussion with patient.   Patient presented to [place] ED on 03/23/21 due to Epistaxis. Patient was present for 49 min.   New?Medications Started at [facility] Discharge:?? -started  none   Medication Changes at Hospital Discharge: -Changed  None   Medications Discontinued at Hospital Discharge: -Stopped none   Medications that remain the same after Hospital Discharge:??  -All other medications will remain the same.   Medications: Outpatient  Encounter Medications as of 08/22/2021  Medication Sig   acetaminophen (TYLENOL) 325 MG tablet Take 325 mg by mouth 2 (two) times daily as needed for moderate pain or headache.   ammonium lactate (LAC-HYDRIN) 12 % lotion 1 application   apixaban (ELIQUIS) 5 MG TABS tablet Take 1 tablet (5 mg total) by mouth 2 (two) times daily.   aspirin 81 MG EC tablet Take 81 mg by mouth every evening. (Patient not taking: Reported on 08/01/2021)   atorvastatin (LIPITOR) 10 MG tablet Take 0.5 tablets (5 mg total) by mouth daily after supper.   Blood Glucose Monitoring Suppl (ONE TOUCH ULTRA 2) w/Device KIT Use as directed.   Calcium Carb-Cholecalciferol (CALCIUM 600+D3 PO) Take 1 tablet by mouth daily.   furosemide (LASIX) 40 MG tablet Take 1.5 tablets (60 mg total) by mouth daily. (Patient taking differently: Take 40 mg by mouth daily.)   glucose blood test strip 1 each by Other route as needed for other. Use as instructed with One Touch glucometer.   Magnesium Oxide 400 MG CAPS Take 1 capsule (400 mg total) by mouth daily.   metoprolol tartrate (LOPRESSOR) 100 MG tablet Take 0.5 tablets (50 mg total) by mouth 2 (two) times daily.   mometasone (ELOCON) 0.1 % lotion Apply topically daily. (Patient not taking: Reported on 08/01/2021)   Multiple Vitamin (MULTIVITAMIN WITH MINERALS) TABS tablet Take 1 tablet by mouth daily. One-A-Day   ondansetron (ZOFRAN-ODT) 4 MG disintegrating tablet Take 1 tablet (4 mg total) by mouth every 8 (eight) hours as needed for nausea or vomiting. (Patient not taking: Reported on 08/01/2021)   ramipril (ALTACE) 10 MG capsule Take 2 capsules (20 mg total) by mouth daily.   sertraline (ZOLOFT) 50 MG tablet Take 1 tablet (50 mg total)  by mouth daily.   vitamin B-12 (CYANOCOBALAMIN) 500 MCG tablet Take 500 mcg by mouth daily.   vitamin E 400 UNIT capsule Take 400 Units by mouth daily.   No facility-administered encounter medications on file as of 08/22/2021.  Recent Relevant Labs: Lab Results   Component Value Date/Time   HGBA1C 6.5 (A) 01/03/2021 08:48 AM   HGBA1C 6.5 (H) 07/19/2020 11:15 AM   HGBA1C 6.3 (A) 01/08/2020 12:16 PM   HGBA1C 6.6 (H) 07/14/2019 08:54 AM    Kidney Function Lab Results  Component Value Date/Time   CREATININE 0.79 08/01/2021 11:15 AM   CREATININE 0.83 07/19/2020 11:15 AM   CREATININE 0.57 (L) 03/22/2017 04:44 PM   CREATININE 0.76 01/23/2016 01:11 PM   GFR 66.77 06/24/2019 03:55 PM   GFRNONAA 61 09/08/2019 01:00 PM   GFRAA 70 09/08/2019 01:00 PM    Current antihyperglycemic regimen:  none What recent interventions/DTPs have been made to improve glycemic control:  Patient reports no changes Have there been any recent hospitalizations or ED visits since last visit with CPP? Yes Patient denies hypoglycemic symptoms, including None Patient denies hyperglycemic symptoms, including none How often are you checking your blood sugar? in the morning before eating or drinking What are your blood sugars ranging?  Fasting: 150 is what she is usually before eating in the mornings Patient reports she enjoys having a Frappe in the evening and is sure that is why its that high in the AM. Suggested a less sugary beverage of choice at that time, patient reports she will have to try that or something else to see what she gets for her fasting reading. During the week, how often does your blood glucose drop below 70? Never  Adherence Review: Is the patient currently on a STATIN medication? Yes Is the patient currently on ACE/ARB medication? Yes Does the patient have >5 day gap between last estimated fill dates? Yes Reviewed chart prior to disease state call. Spoke with patient regarding BP  Recent Office Vitals: BP Readings from Last 3 Encounters:  08/02/21 120/80  08/01/21 120/80  03/26/21 (!) 125/94   Pulse Readings from Last 3 Encounters:  08/01/21 73  03/26/21 (!) 116  03/23/21 89    Wt Readings from Last 3 Encounters:  08/02/21 206 lb (93.4 kg)   08/01/21 206 lb (93.4 kg)  03/26/21 213 lb 13.5 oz (97 kg)     Kidney Function Lab Results  Component Value Date/Time   CREATININE 0.79 08/01/2021 11:15 AM   CREATININE 0.83 07/19/2020 11:15 AM   CREATININE 0.57 (L) 03/22/2017 04:44 PM   CREATININE 0.76 01/23/2016 01:11 PM   GFR 66.77 06/24/2019 03:55 PM   GFRNONAA 61 09/08/2019 01:00 PM   GFRAA 70 09/08/2019 01:00 PM       Latest Ref Rng & Units 08/01/2021   11:15 AM 07/19/2020   11:15 AM 09/08/2019    1:00 PM  BMP  Glucose 70 - 99 mg/dL 106   85   103    BUN 8 - 27 mg/dL '27   25   31    ' Creatinine 0.57 - 1.00 mg/dL 0.79   0.83   0.86    BUN/Creat Ratio 12 - 28 34   30   36    Sodium 134 - 144 mmol/L 140   140   145    Potassium 3.5 - 5.2 mmol/L 5.0   4.5   5.0    Chloride 96 - 106 mmol/L 101   98  102    CO2 20 - 29 mmol/L '28   25   28    ' Calcium 8.7 - 10.3 mg/dL 9.8   10.1   10.0      Current antihypertensive regimen:  Ramipril (Altace) 10 mg - last filled on 02/26/21 90DS at [place] How often are you checking your Blood Pressure? weekly Current home BP readings: Patient reports she has an aide that is with her during the week and she has been checking for her 120/70 What recent interventions/DTPs have been made by any provider to improve Blood Pressure control since last CPP Visit: Patient reports none Any recent hospitalizations or ED visits since last visit with CPP? Yes Patient reports she is still using her walker and walking but not as much as she used to or would like as she feels like she isnt as steady.  Adherence Review: Is the patient currently on ACE/ARB medication? Yes Does the patient have >5 day gap between last estimated fill dates? Yes   Care Gaps:  COVID Booster - Overdue Foot Exam - Overdue Eye Exam - Overdue HGB A1C - Overdue BP- 120/80 ( 08/02/21) AWV- 08/02/21 CCM- 11/23 Lab Results  Component Value Date   HGBA1C 6.5 (A) 01/03/2021    Star Rating Drugs: Atorvastatin (Lipitor) 10 mg -  last filled on 08/07/21  90DS at [facility] Ramipril (Altace) 10 mg - last filled on 02/26/21 90DS at [facility] As Accurate Patient reports she is compliant in use of the above medication and taking daily.   [place] Clinical Pharmacist Assistant [phone]

## 2021-10-31 ENCOUNTER — Other Ambulatory Visit: Payer: Self-pay

## 2021-10-31 DIAGNOSIS — I4821 Permanent atrial fibrillation: Secondary | ICD-10-CM

## 2021-10-31 MED ORDER — APIXABAN 5 MG PO TABS: 5.0000 mg | ORAL_TABLET | Freq: Two times a day (BID) | ORAL | 1 refills | Status: DC

## 2021-10-31 NOTE — Telephone Encounter (Signed)
Faxed Eliquis refill received from [facility] order pharmacy. Pt last saw Otilio Saber, [place] on 08/01/21. Last labs 08/01/21 Creat 0.79, age 88, weight 93.4kg, based on specified criteria pt is on appropriate dosage of Eliquis 5mg  BID for afib.  Will refill rx.

## 2021-11-15 ENCOUNTER — Telehealth: Payer: Self-pay

## 2021-11-15 NOTE — Telephone Encounter (Signed)
Last OV 01/03/21. No future appt scheduled Pt notified of above. Appt scheduled for 01/02/22

## 2021-12-06 ENCOUNTER — Other Ambulatory Visit: Payer: Self-pay

## 2021-12-06 MED ORDER — FUROSEMIDE 40 MG PO TABS: 60.0000 mg | ORAL_TABLET | Freq: Every day | ORAL | 2 refills | Status: DC

## 2022-01-02 ENCOUNTER — Encounter: Payer: Self-pay | Admitting: Family Medicine

## 2022-01-02 ENCOUNTER — Ambulatory Visit (INDEPENDENT_AMBULATORY_CARE_PROVIDER_SITE_OTHER): Payer: PPO | Admitting: Family Medicine

## 2022-01-02 VITALS — BP 110/60 | HR 80 | Temp 98.1°F | Ht 65.0 in | Wt 203.6 lb

## 2022-01-02 DIAGNOSIS — R739 Hyperglycemia, unspecified: Secondary | ICD-10-CM | POA: Diagnosis not present

## 2022-01-02 DIAGNOSIS — I119 Hypertensive heart disease without heart failure: Secondary | ICD-10-CM

## 2022-01-02 DIAGNOSIS — Z23 Encounter for immunization: Secondary | ICD-10-CM

## 2022-01-02 DIAGNOSIS — E785 Hyperlipidemia, unspecified: Secondary | ICD-10-CM | POA: Diagnosis not present

## 2022-01-02 DIAGNOSIS — R059 Cough, unspecified: Secondary | ICD-10-CM | POA: Diagnosis not present

## 2022-01-02 LAB — BASIC METABOLIC PANEL
BUN: 25 mg/dL — ABNORMAL HIGH (ref 6–23)
CO2: 32 mEq/L (ref 19–32)
Calcium: 9.2 mg/dL (ref 8.4–10.5)
Chloride: 99 mEq/L (ref 96–112)
Creatinine, Ser: 0.91 mg/dL (ref 0.40–1.20)
GFR: 55.63 mL/min — ABNORMAL LOW (ref >60.00)
Glucose, Bld: 131 mg/dL — ABNORMAL HIGH (ref 70–99)
Potassium: 5 mEq/L (ref 3.5–5.1)
Sodium: 138 mEq/L (ref 135–145)

## 2022-01-02 LAB — HEPATIC FUNCTION PANEL
ALT: 12 U/L (ref 0–35)
AST: 17 U/L (ref 0–37)
Albumin: 3.7 g/dL (ref 3.5–5.2)
Alkaline Phosphatase: 76 U/L (ref 39–117)
Bilirubin, Direct: 0.2 mg/dL (ref 0.0–0.3)
Total Bilirubin: 1.2 mg/dL (ref 0.2–1.2)
Total Protein: 7.3 g/dL (ref 6.0–8.3)

## 2022-01-02 LAB — LIPID PANEL
Cholesterol: 143 mg/dL (ref 0–200)
HDL: 38.4 mg/dL — ABNORMAL LOW (ref >39.00)
LDL Cholesterol: 80 mg/dL (ref 0–99)
NonHDL: 104.31
Total CHOL/HDL Ratio: 4
Triglycerides: 121 mg/dL (ref 0.0–149.0)
VLDL: 24.2 mg/dL (ref 0.0–40.0)

## 2022-01-02 LAB — HEMOGLOBIN A1C: Hgb A1c MFr Bld: 7.6 % — ABNORMAL HIGH (ref 4.6–6.5)

## 2022-01-02 MED ORDER — SERTRALINE HCL 25 MG PO TABS: 25.0000 mg | ORAL_TABLET | Freq: Every day | ORAL | 3 refills | Status: DC

## 2022-01-02 NOTE — Progress Notes (Signed)
Established Patient Office Visit  Subjective   Patient ID: [name], female    DOB: 1930-03-12  Age: 88 y.o. MRN: [id number]  Chief Complaint  Patient presents with   Annual Exam    HPI   Here for medical follow-up.  She is here today with one of her caregivers.  She has history of atrial fibrillation, CAD, systolic heart failure, obstructive sleep apnea, mild hyperglycemia by multiple previous labs., hyperlipidemia.  She is generally doing well.  She is followed by cardiology.  Remains on Eliquis and metoprolol.  She had labs with CBC and basic metabolic panel several months ago and these were reviewed.  She remains on Lipitor 10 mg daily for hyperlipidemia and due for follow-up lipids.  No recent A1c.  Does complain of some nighttime cough.  She thinks she may have some silent GERD symptoms.  Frequently clears throat.  Does sleep in a recliner type chair with some incline.  Appetite and weight stable.  She complains of some intermittent itching in the right ear.  Does have chronic hearing loss but no changes.  Past Medical History:  Diagnosis Date   Anemia    CAD 09/24/2008   AMI in 1998 tx with PCI to LAD;  myoview 1/12:  no ischemia, EF 45%   CARDIOMYOPATHY 09/24/2008   ischemic;  echo 4/12:  EF 20-25%, mild MR, mod LAE, mod reduced RVSF, mod RVE, mild RAE, mild TR, PASP 40   CAROTID BRUIT 09/24/2008   DIABETES MELLITUS 09/24/2008   Edema 09/24/2008   GERD (gastroesophageal reflux disease)    HYPERLIPIDEMIA 09/24/2008   HYPERTENSION 09/24/2008   MI 09/24/2008   Osteopenia    Persistent atrial fibrillation (HCC) 09/24/2008   s/p DCCV x 4 in past;  Amiod d/c'd 2/2 abnormal PFTs;  Tikosyn load 6/12 with DCCV   Tachycardia-bradycardia [facility])    Past Surgical History:  Procedure Laterality Date   CARDIOVERSION N/A 02/03/2015   Procedure: CARDIOVERSION;  Surgeon: Lars Masson, MD;  Location: [facility] ENDOSCOPY;  Service: Cardiovascular;  Laterality: N/A;   CARDIOVERSION N/A 07/08/2018    Procedure: CARDIOVERSION;  Surgeon: Vesta Mixer, MD;  Location: [facility] ENDOSCOPY;  Service: Cardiovascular;  Laterality: N/A;  getting INR prior   ELECTROPHYSIOLOGIC STUDY N/A 10/15/2014   PVI and CTI ablation by Dr Johney Frame   HIP FRACTURE SURGERY  2006   KNEE SURGERY  2008   broken knee   LOOP RECORDER IMPLANT N/A 11/06/2012   Procedure: LOOP RECORDER IMPLANT;  Surgeon: Hillis Range, MD;  Location: [facility] CATH LAB;  Service: Cardiovascular;  Laterality: N/A;Medtronic LinQ implanted by Dr Johney Frame for palpitaitons and dizziness   TEE WITHOUT CARDIOVERSION N/A 10/15/2014   Procedure: TRANSESOPHAGEAL ECHOCARDIOGRAM (TEE);  Surgeon: Vesta Mixer, MD;  Location: [facility] ENDOSCOPY;  Service: Cardiovascular;  Laterality: N/A;    reports that she quit smoking about 25 years ago. Her smoking use included cigarettes. She has a 10.00 pack-year smoking history. She has been exposed to tobacco smoke. She has never used smokeless tobacco. She reports that she does not drink alcohol and does not use drugs. family history includes Alcohol abuse in her brother and father; Brain cancer in her sister; Breast cancer in her mother and sister; Cancer in her brother; Coronary artery disease in an other family member; Diabetes in her mother; Heart attack in her brother and brother; Heart disease in her brother, brother, and mother; Hypertension in an other family member. Allergies  Allergen Reactions   Simvastatin Rash  Review of Systems  Constitutional:  Negative for chills, fever and weight loss.  Respiratory:  Positive for cough. Negative for shortness of breath and wheezing.   Cardiovascular:  Negative for chest pain.  Gastrointestinal:  Negative for abdominal pain.  Genitourinary:  Negative for dysuria.  Neurological:  Negative for dizziness and focal weakness.      Objective:     BP 110/60 (BP Location: Left Arm, Patient Position: Sitting, Cuff Size: Large)   Pulse 80   Temp 98.1 F (36.7 C) (Oral)   Ht 5'  5" (1.651 m)   Wt 203 lb 9.6 oz (92.4 kg)   SpO2 96%   BMI 33.88 kg/m  BP Readings from Last 3 Encounters:  01/02/22 110/60  08/02/21 120/80  08/01/21 120/80   Wt Readings from Last 3 Encounters:  01/02/22 203 lb 9.6 oz (92.4 kg)  08/02/21 206 lb (93.4 kg)  08/01/21 206 lb (93.4 kg)      Physical Exam Vitals reviewed.  Constitutional:      Appearance: She is well-developed.  HENT:     Ears:     Comments: Mild scaling and dryness ear canals.  No signs of infection. Eyes:     Pupils: Pupils are equal, round, and reactive to light.  Neck:     Thyroid: No thyromegaly.     Vascular: No JVD.  Cardiovascular:     Rate and Rhythm: Normal rate.     Comments: Heart sounds are somewhat distant and challenging to auscultate. Pulmonary:     Effort: Pulmonary effort is normal. No respiratory distress.     Breath sounds: Normal breath sounds. No wheezing or rales.  Musculoskeletal:     Cervical back: Neck supple.     Comments: She has support hose on bilaterally  Neurological:     General: No focal deficit present.     Mental Status: She is alert.      No results found for any visits on 01/02/22.    The ASCVD Risk score (Arnett DK, et al., 2019) failed to calculate for the following reasons:   The 2019 ASCVD risk score is only valid for ages 90 to 58   The patient has a prior MI or stroke diagnosis    Assessment & Plan:   #1 hyperlipidemia.  Patient on atorvastatin.  Recheck lipid and hepatic panel.  #2 history of atrial fibrillation/flutter.  Patient remains on Eliquis and metoprolol.  Recent CBC normal.  #3 intermittent dry cough.  Suspect she may be having some silent GERD.  Avoid eating within 2 to 3 hours of bedtime.  Consider over-the-counter Pepcid 20 mg once daily.  Touch base if cough not improving next few weeks  #4 history of hyperglycemia.  She had multiple mildly elevated glucoses previously.  Check A1c.  Try to avoid high glycemic foods.  -Flu vaccine  given   No follow-ups on file.    [name], MD

## 2022-01-02 NOTE — Patient Instructions (Signed)
Consider Pepcid 20 mg once daily as needed for reflux symptoms.

## 2022-02-06 ENCOUNTER — Other Ambulatory Visit: Payer: Self-pay | Admitting: *Deleted

## 2022-02-06 DIAGNOSIS — I4821 Permanent atrial fibrillation: Secondary | ICD-10-CM

## 2022-02-06 MED ORDER — APIXABAN 5 MG PO TABS: 5.0000 mg | ORAL_TABLET | Freq: Two times a day (BID) | ORAL | 1 refills | Status: DC

## 2022-02-06 NOTE — Telephone Encounter (Signed)
Eliquis 5mg  refill request received. Patient is 88 years old, weight-92.4kg, Crea-0.91 on 01/02/2022, Diagnosis-Afib, and last seen by Oda Kilts on 08/01/2021. Dose is appropriate based on dosing criteria. Will send in refill to requested pharmacy.

## 2022-03-09 ENCOUNTER — Telehealth: Payer: Self-pay | Admitting: Pharmacist

## 2022-03-09 NOTE — Chronic Care Management (AMB) (Signed)
    Chronic Care Management Pharmacy Assistant   Name: KYM SCANNELL  MRN: [id number] DOB: 05/20/1929  03/09/22 APPOINTMENT REMINDER  Patient was reminded to have all medications, supplements and any blood glucose and blood pressure readings available for review with Jeni Salles, Pharm. D, for telephone visit on 03/12/22 at 2.    Care Gaps: COVID Booster - Overdue Foot Exam - Overdue Eye Exam - Overdue Zoster Vaccine - Overdue PNA Vaccine - Postponed DEXA - Postponed  Star Rating Drug: Atorvastatin (Lipitor) 10 mg - last filled on 10/31/21  45 DS at [facility] Ramipril (Altace) 10 mg - last filled on 02/26/21 90DS at [facility] Verified   Medications: Outpatient Encounter Medications as of 03/09/2022  Medication Sig   acetaminophen (TYLENOL) 325 MG tablet Take 325 mg by mouth 2 (two) times daily as needed for moderate pain or headache.   ammonium lactate (LAC-HYDRIN) 12 % lotion 1 application   apixaban (ELIQUIS) 5 MG TABS tablet Take 1 tablet (5 mg total) by mouth 2 (two) times daily.   atorvastatin (LIPITOR) 10 MG tablet Take 0.5 tablets (5 mg total) by mouth daily after supper.   Blood Glucose Monitoring Suppl (ONE TOUCH ULTRA 2) w/Device KIT Use as directed.   Calcium Carb-Cholecalciferol (CALCIUM 600+D3 PO) Take 1 tablet by mouth daily.   furosemide (LASIX) 40 MG tablet Take 1.5 tablets (60 mg total) by mouth daily. (Patient taking differently: Take 40 mg by mouth daily.)   glucose blood test strip 1 each by Other route as needed for other. Use as instructed with One Touch glucometer.   Magnesium Oxide 400 MG CAPS Take 1 capsule (400 mg total) by mouth daily.   metoprolol tartrate (LOPRESSOR) 100 MG tablet Take 0.5 tablets (50 mg total) by mouth 2 (two) times daily.   mometasone (ELOCON) 0.1 % lotion Apply topically daily.   Multiple Vitamin (MULTIVITAMIN WITH MINERALS) TABS tablet Take 1 tablet by mouth daily. One-A-Day   ramipril (ALTACE) 10 MG capsule Take 2 capsules (20 mg  total) by mouth daily.   sertraline (ZOLOFT) 25 MG tablet Take 1 tablet (25 mg total) by mouth daily.   vitamin B-12 (CYANOCOBALAMIN) 500 MCG tablet Take 500 mcg by mouth daily.   vitamin E 400 UNIT capsule Take 400 Units by mouth daily.   No facility-administered encounter medications on file as of 03/09/2022.     [place] Clinical Pharmacist Assistant [phone]

## 2022-03-12 ENCOUNTER — Other Ambulatory Visit: Payer: Self-pay

## 2022-03-12 ENCOUNTER — Ambulatory Visit (INDEPENDENT_AMBULATORY_CARE_PROVIDER_SITE_OTHER): Payer: PPO | Admitting: Pharmacist

## 2022-03-12 DIAGNOSIS — I119 Hypertensive heart disease without heart failure: Secondary | ICD-10-CM

## 2022-03-12 DIAGNOSIS — E119 Type 2 diabetes mellitus without complications: Secondary | ICD-10-CM

## 2022-03-12 MED ORDER — RAMIPRIL 10 MG PO CAPS: 20.0000 mg | ORAL_CAPSULE | Freq: Every day | ORAL | 1 refills | Status: DC

## 2022-03-12 NOTE — Progress Notes (Signed)
Chronic Care Management Pharmacy Note  03/12/2022 Name:  [name] MRN:  [id number] DOB:  Jul 23, 1929  Summary: Pt reports FBG in 150-180s range Pt does not check blood pressure at home  Recommendations/Changes made from today's visit: -Recommended weekly monitoring of blood pressures  -Consider CGM monitoring for dietary adjustments  Plan: Scheduled PCP visit for 1 month for repeat A1c BP and DM follow up in 2 months   Subjective: [name] is an 88 y.o. year old female who is a primary patient of [name], Alinda Sierras, MD.  The CCM team was consulted for assistance with disease management and care coordination needs.    Engaged with patient by telephone for follow up visit in response to provider referral for pharmacy case management and/or care coordination services.   Consent to Services:  The patient was given information about Chronic Care Management services, agreed to services, and gave verbal consent prior to initiation of services.  Please see initial visit note for detailed documentation.   Patient Care Team: Eulas Post, MD as PCP - General (Family Medicine) Thompson Grayer, MD as PCP - Electrophysiology (Cardiology) Viona Gilmore, [facility] as Pharmacist (Pharmacist)  Recent office visits: 01/02/22 Carolann Littler MD: Patient presented for annual exam. Decreased sertraline to 25 mg daily. D/c'd aspirin. Recommended follow up in 3 months due to elevated A1c.  Recent consult visits: 08/01/21 Shirley Friar, PA-C (Cardiology) - Patient presented for Chronic systolic congestive heart failure and other concerns. No medication changes.   Hospital visits: None in previous 6 months  Objective:  Lab Results  Component Value Date   CREATININE 0.91 01/02/2022   BUN 25 (H) 01/02/2022   GFR 55.63 (L) 01/02/2022   GFRNONAA 61 09/08/2019   GFRAA 70 09/08/2019   NA 138 01/02/2022   K 5.0 01/02/2022   CALCIUM 9.2 01/02/2022   CO2 32 01/02/2022    GLUCOSE 131 (H) 01/02/2022    Lab Results  Component Value Date/Time   HGBA1C 7.6 (H) 01/02/2022 11:37 AM   HGBA1C 6.5 (A) 01/03/2021 08:48 AM   HGBA1C 6.5 (H) 07/19/2020 11:15 AM   GFR 55.63 (L) 01/02/2022 11:37 AM   GFR 66.77 06/24/2019 03:55 PM    Last diabetic Eye exam: No results found for: "HMDIABEYEEXA"  Last diabetic Foot exam: No results found for: "HMDIABFOOTEX"   Lab Results  Component Value Date   CHOL 143 01/02/2022   HDL 38.40 (L) 01/02/2022   LDLCALC 80 01/02/2022   TRIG 121.0 01/02/2022   CHOLHDL 4 01/02/2022       Latest Ref Rng & Units 01/02/2022   11:37 AM 01/03/2021    9:33 AM 10/09/2018   10:53 AM  Hepatic Function  Total Protein 6.0 - 8.3 g/dL 7.3  7.1  7.0   Albumin 3.5 - 5.2 g/dL 3.7  3.7  4.3   AST 0 - 37 U/L _0 ALT 0 - 35 U/L _1 Alk Phosphatase 39 - 117 U/L 76  68  80   Total Bilirubin 0.2 - 1.2 mg/dL 1.2  1.2  0.9   Bilirubin, Direct 0.0 - 0.3 mg/dL 0.2  0.2      Lab Results  Component Value Date/Time   TSH 1.42 06/24/2019 03:55 PM   TSH 0.52 10/03/2009 09:10 AM       Latest Ref Rng & Units 08/01/2021   11:15 AM 07/19/2020   11:15 AM 12/27/2018  2:02 PM  CBC  WBC 3.4 - 10.8 x10E3/uL 10.0  9.0  13.8   Hemoglobin 11.1 - 15.9 g/dL 14.0  14.5  14.6   Hematocrit 34.0 - 46.6 % 42.1  43.8  45.7   Platelets 150 - 450 x10E3/uL 302  301  297     No results found for: "VD25OH"  Clinical ASCVD: Yes  The ASCVD Risk score (Arnett DK, et al., 2019) failed to calculate for the following reasons:   The 2019 ASCVD risk score is only valid for ages 34 to 40   The patient has a prior MI or stroke diagnosis       08/02/2021   11:42 AM 07/27/2020   10:22 AM 03/13/2018   11:45 AM  Depression screen PHQ 2/9  Decreased Interest 0 0 0  Down, Depressed, Hopeless 0 0 0  PHQ - 2 Score 0 0 0    CHA2DS2/VAS Stroke Risk Points  Current as of 7 days ago     7 >= 2 Points: High Risk  1 - 1.99 Points: Medium Risk  0 Points: Low Risk     Last Change: N/A      Details    This score determines the patient's risk of having a stroke if the  patient has atrial fibrillation.       Points Metrics  1 Has Congestive Heart Failure:  Yes    Current as of 7 days ago  1 Has Vascular Disease:  Yes    Current as of 7 days ago  1 Has Hypertension:  Yes    Current as of 7 days ago  2 Age:  9    Current as of 7 days ago  1 Has Diabetes:  Yes    Current as of 7 days ago  0 Had Stroke:  No  Had TIA:  No  Had Thromboembolism:  No    Current as of 7 days ago  1 Female:  Yes    Current as of 7 days ago     Social History   Tobacco Use  Smoking Status Former   Packs/day: 0.50   Years: 20.00   Total pack years: 10.00   Types: Cigarettes   Quit date: 07/11/1996   Years since quitting: 25.6   Passive exposure: Past  Smokeless Tobacco Never   BP Readings from Last 3 Encounters:  01/02/22 110/60  08/02/21 120/80  08/01/21 120/80   Pulse Readings from Last 3 Encounters:  01/02/22 80  08/01/21 73  03/26/21 (!) 116   Wt Readings from Last 3 Encounters:  01/02/22 203 lb 9.6 oz (92.4 kg)  08/02/21 206 lb (93.4 kg)  08/01/21 206 lb (93.4 kg)   BMI Readings from Last 3 Encounters:  01/02/22 33.88 kg/m  08/02/21 34.28 kg/m  08/01/21 34.28 kg/m    Assessment/Interventions: Review of patient past medical history, allergies, medications, health status, including review of consultants reports, laboratory and other test data, was performed as part of comprehensive evaluation and provision of chronic care management services.   SDOH:  (Social Determinants of Health) assessments and interventions performed: Yes  SDOH Interventions    Flowsheet Row Chronic Care Management from 03/12/2022 in [place] at [place] from 08/02/2021 in [place] at [place] Management from 11/26/2019 in [place] at [place] Interventions -- Intervention  Not Indicated --  Housing Interventions -- Intervention Not Indicated --  Transportation Interventions -- Intervention Not  Indicated Intervention Not Indicated  Financial Strain Interventions Intervention Not Indicated Intervention Not Indicated Intervention Not Indicated  Physical Activity Interventions -- Intervention Not Indicated --  Stress Interventions -- Intervention Not Indicated --  Social Connections Interventions -- Intervention Not Indicated --      SDOH Screenings   Food Insecurity: No Food Insecurity (08/02/2021)  Housing: Low Risk  (08/02/2021)  Transportation Needs: No Transportation Needs (08/02/2021)  Alcohol Screen: Low Risk  (08/02/2021)  Depression (PHQ2-9): Low Risk  (08/02/2021)  Financial Resource Strain: Low Risk  (03/12/2022)  Physical Activity: Inactive (08/02/2021)  Social Connections: Moderately Integrated (08/02/2021)  Stress: No Stress Concern Present (08/02/2021)  Tobacco Use: Medium Risk (01/02/2022)    CCM Care Plan  Allergies  Allergen Reactions   Simvastatin Rash    Medications Reviewed Today     Reviewed by Viona Gilmore, [facility] (Pharmacist) on 03/12/22 at 1425  Med List Status: <None>   Medication Order Taking? Sig Documenting Provider Last Dose Status Informant  acetaminophen (TYLENOL) 325 MG tablet [id number]  Take 325 mg by mouth 2 (two) times daily as needed for moderate pain or headache. [provider]  Active Self  ammonium lactate (LAC-HYDRIN) 12 % lotion [id number]  1 application [provider]  Active   apixaban (ELIQUIS) 5 MG TABS tablet [id number]  Take 1 tablet (5 mg total) by mouth 2 (two) times daily. Shirley Friar, PA-C  Active   atorvastatin (LIPITOR) 10 MG tablet [id number] Yes Take 0.5 tablets (5 mg total) by mouth daily after supper. Thompson Grayer, MD Taking Active   Blood Glucose Monitoring Suppl (ONE TOUCH ULTRA 2) w/Device KIT [id number]  Use as directed. Eulas Post, MD  Active   Calcium  Carb-Cholecalciferol (CALCIUM 600+D3 PO) [id number]  Take 1 tablet by mouth daily. [provider]  Active Self  furosemide (LASIX) 40 MG tablet [id number] Yes Take 1.5 tablets (60 mg total) by mouth daily.  Patient taking differently: Take 40 mg by mouth daily.   Shirley Friar, PA-C Taking Active   glucose blood test strip [id number]  1 each by Other route as needed for other. Use as instructed with One Touch glucometer. Eulas Post, MD  Active   Magnesium Oxide 400 MG CAPS [id number]  Take 1 capsule (400 mg total) by mouth daily. Isaiah Serge, NP  Active Self  metoprolol tartrate (LOPRESSOR) 100 MG tablet [id number] Yes Take 0.5 tablets (50 mg total) by mouth 2 (two) times daily. Thompson Grayer, MD Taking Active   mometasone (ELOCON) 0.1 % lotion [id number]  Apply topically daily. Eulas Post, MD  Active   Multiple Vitamin (MULTIVITAMIN WITH MINERALS) TABS tablet [id number]  Take 1 tablet by mouth daily. One-A-Day [provider]  Active Self  ramipril (ALTACE) 10 MG capsule [id number]  Take 2 capsules (20 mg total) by mouth daily. Shirley Friar, PA-C  Active   sertraline (ZOLOFT) 25 MG tablet [id number] Yes Take 1 tablet (25 mg total) by mouth daily. Eulas Post, MD Taking Active   vitamin B-12 (CYANOCOBALAMIN) 500 MCG tablet [id number]  Take 500 mcg by mouth daily. [provider]  Active Self            Patient Active Problem List   Diagnosis Date Noted   Gout 01/05/2019   Atypical atrial flutter ([place]) 01/17/2015   Tachycardia-bradycardia ([place]) 09/15/2014   Encounter for therapeutic drug monitoring 09/02/2013   OSA on CPAP 03/23/2013   Obesity (BMI 30-39.9) 03/23/2013   Chronic  systolic heart failure ([place]) 11/02/2012   Dizziness 11/02/2012   A-fib ([place]) 08/10/2011   Osteopenia    Long term (current) use of anticoagulants 09/21/2010   DIABETES MELLITUS 09/24/2008   HYPERCHOLESTEROLEMIA 09/24/2008   Hyperlipidemia  09/24/2008   Hypertensive cardiovascular disease 09/24/2008   MI 09/24/2008   Coronary atherosclerosis 09/24/2008   CARDIOMYOPATHY 09/24/2008   PERSISTENT ATRIAL FIBRILLATION 09/24/2008   EDEMA 09/24/2008   CAROTID BRUIT 09/24/2008    Immunization History  Administered Date(s) Administered   Fluad Quad(high Dose 65+) 01/05/2019, 01/03/2021, 01/02/2022   Influenza Split 02/20/2012   Influenza, High Dose Seasonal PF 03/03/2015, 02/21/2018   Influenza,inj,Quad PF,6+ Mos 03/13/2013, 02/08/2014, 02/13/2016, 02/06/2017   Pneumococcal Conjugate-13 04/23/1998, 02/22/2014   Patient reports she didn't think her blood sugars have been up. She has been checking first thing in the morning and sees readings from 150s-180s. Patient reports she has been having more sweets lately, especially [employer] frappes. She said they are hard to give up and isn't sure she will be able to even cut back on them.  Patient is having feet swelling still. She sat all day on Sunday and is having a lot today. She will try 1.5 tablets of furosemide today and she tries to stay away from salt as best as possible.  Conditions to be addressed/monitored:  Hypertension, Hyperlipidemia, Diabetes, Atrial Fibrillation, Heart Failure, Coronary Artery Disease, Osteopenia, and Gout  Conditions addressed this visit: Hypertension, diabetes   Care Plan : [place]  Updates made by Viona Gilmore, [place] since 03/12/2022 12:00 AM     Problem: Problem: Hypertension, Hyperlipidemia, Diabetes, Atrial Fibrillation, Heart Failure, Coronary Artery Disease, Osteopenia, and Gout      Long-Range Goal: Patient-Specific Goal   Start Date: 03/07/2021  Expected End Date: 03/07/2022  Recent Progress: On track  Priority: High  Note:   Current Barriers:  Unable to independently monitor therapeutic efficacy  Pharmacist Clinical Goal(s):  Patient will achieve adherence to monitoring guidelines and medication adherence to  achieve therapeutic efficacy through collaboration with PharmD and provider.   Interventions: 1:1 collaboration with Eulas Post, MD regarding development and update of comprehensive plan of care as evidenced by provider attestation and co-signature Inter-disciplinary care team collaboration (see longitudinal plan of care) Comprehensive medication review performed; medication list updated in electronic medical record  Hypertension (BP goal <140/90) -Not ideally controlled -Current treatment: Furosemide 40 mg 1.5 tablets daily (take 40 mg daily) - Appropriate, Query effective, Safe, Accessible Metoprolol tartrate 100 mg 1/2 tablet twice daily  - Appropriate, Effective, Safe, Accessible Ramipril 10 mg 2 capsule daily - Appropriate, Effective, Safe, Accessible  -Medications previously tried: n/a  -Current home readings: owns an old arm cuff but does not check regularly -Current dietary habits: tries to limit sodium intake -Current exercise habits: limited -Denies hypotensive/hypertensive symptoms -Educated on BP goals and benefits of medications for prevention of heart attack, stroke and kidney damage; Importance of home blood pressure monitoring; Proper BP monitoring technique; -Counseled to monitor BP at home weekly or every other week, document, and provide log at future appointments -Counseled on diet and exercise extensively Recommended to continue current medication  Hyperlipidemia: (LDL goal < 70) -Not ideally controlled -Current treatment: Atorvastatin 10 mg 1/2 tablet daily - Appropriate, Query effective, Safe, Accessible -Medications previously tried: simvastatin  -Current dietary patterns: did not discuss -Current exercise habits: limited -Educated on Cholesterol goals;  -Recommended to continue current medication  Diabetes (A1c goal <8%) -Controlled -Current medications: No medications -Medications previously  tried: n/a  -Current home glucose readings fasting  glucose: 150-180s post prandial glucose: does not check -Denies hypoglycemic/hyperglycemic symptoms -Current meal patterns:  breakfast: n/a  lunch: n/a  dinner: n/a snacks: n/a drinks: [employer] frappes 3 times a week -Current exercise: limited -Educated on A1c and blood sugar goals; Benefits of routine self-monitoring of blood sugar; Continuous glucose monitoring; -Counseled to check feet daily and get yearly eye exams -Counseled on diet and exercise extensively  Atrial Fibrillation (Goal: prevent stroke and major bleeding) -Controlled -CHADSVASC: 7 -Current treatment: Rate control: Metoprolol Tartrate 100 mg 1/2 tab twice daily - Appropriate, Effective, Safe, Accessible Anticoagulation: Apixaban 5 mg twice daily - Appropriate, Effective, Safe, Accessible -Medications previously tried: Dofetilide (2012-2020 d/t recurrence and cost), Amiodarone (2011-2012 d/t PFTs) -Home BP and HR readings: does not check  -Counseled on importance of adherence to anticoagulant exactly as prescribed; bleeding risk associated with Eliquis and importance of self-monitoring for signs/symptoms of bleeding; avoidance of NSAIDs due to increased bleeding risk with anticoagulants; -Recommended to continue current medication  Heart Failure (Goal: manage symptoms and prevent exacerbations) -Controlled -Last ejection fraction: 20-25% (Date: 10/16/14) -HF type: Systolic -NYHA Class: III (marked limitation of activity) -AHA HF Stage: C (Heart disease and symptoms present) -Current treatment: Furosemide 40 mg 1.5 tablets daily (taking one tablet daily)  - Appropriate, Query effective, Safe, Accessible Metoprolol tartrate 100 mg 1/2 tablet twice daily  - Appropriate, Effective, Safe, Accessible Ramipril 10 mg 1 capsule twice daily  - Appropriate, Effective, Safe, Accessible -Medications previously tried: n/a  -Current home BP/HR readings: does not check -Current dietary habits: not able to cook right now  (meals on wheels) -Current exercise habits: limited -Educated on Importance of weighing daily; if you gain more than 3 pounds in one day or 5 pounds in one week, call cardiology. -Counseled on diet and exercise extensively Recommended to continue current medication  Depression/Anxiety (Goal: minimize symptoms) -Controlled -Current treatment: Sertraline 25 mg 1 tablet daily - Appropriate, Effective, Safe, Accessible -Medications previously tried/failed: n/a -PHQ9: 0 -GAD7: n/a -Educated on Benefits of medication for symptom control -Recommended to continue current medication  Osteoporosis (Goal prevent fractures) -Not ideally controlled -Last DEXA Scan: 09/18/96 (unable to access)   T-Score femoral neck: n/a  T-Score total hip: n/a  T-Score lumbar spine: n/a  T-Score forearm radius: n/a  10-year probability of major osteoporotic fracture: n/a  10-year probability of hip fracture: n/a -Patient is not a candidate for pharmacologic treatment -Current treatment  Calcium 600 mg + D3 daily - Appropriate, Effective, Safe, Accessible -Medications previously tried: none  -Recommend [phone] units of vitamin D daily. Recommend 1200 mg of calcium daily from dietary and supplemental sources. Recommend weight-bearing and muscle strengthening exercises for building and maintaining bone density. -Counseled on diet and exercise extensively  Health Maintenance -Vaccine gaps:  COVID, shingrix, tetanus, pneumonia -Current therapy:  APAP 325 mg 2 tabs daily PRN Magnesium Oxide 400 mg daily (taking one 200 mg)   Multivitamin Daily (One-A-Day) Vitamin B12 500 mcg daily  -Educated on Herbal supplement research is limited and benefits usually cannot be proven Cost vs benefit of each product must be carefully weighed by individual consumer -Patient is satisfied with current therapy and denies issues -Recommended stopping vitamin E due to additive bleeding risk.  Patient Goals/Self-Care  Activities Patient will:  - take medications as prescribed as evidenced by patient report and record review check glucose weekly, document, and provide at future appointments check blood pressure weekly or every other week, document, and  provide at future appointments  Follow Up Plan: The care management team will reach out to the patient again over the next 60 days.       Medication Assistance: None required.  Patient affirms current coverage meets needs.  Compliance/Adherence/Medication fill history: Care Gaps: COVID vaccine, foot exam, eye exam (went in May 2023), tetanus, shingrix, Prevnar 20 or Pneumovax, DEXA BP-110/60 (01/02/22)  Star-Rating Drugs: Atorvastatin (Lipitor) 10 mg - last filled on 10/31/21  90 DS at [facility] - pt reports she is still taking but had an abundance from mail order Ramipril (Altace) 10 mg - last filled on 02/26/21 90DS at [facility] - pt reports she is still taking but had an abundance from mail order Verified  Patient's preferred pharmacy is:  [facility] [address], [place] - [address] [place] [place] [address] Phone: [phone] Fax: [phone]  [facility]) - [place], [place] [address] Phone: [phone] Fax: [phone]  [id number] [place] [place]Phone: [phone]: [phone]  Uses pill box? Yes Pt endorses 99% compliance  We discussed: Current pharmacy is preferred with insurance plan and patient is satisfied with pharmacy services Patient decided to: Continue current medication management strategy  Care Plan and Follow Up Patient Decision:  Patient agrees to Care Plan and Follow-up.  Plan: The care management team will reach out to the patient again over the next 60 days.  MJeni Salles  PharmD, BPost FallsPharmacist [place]

## 2022-03-22 DIAGNOSIS — I110 Hypertensive heart disease with heart failure: Secondary | ICD-10-CM | POA: Diagnosis not present

## 2022-03-22 DIAGNOSIS — I5030 Unspecified diastolic (congestive) heart failure: Secondary | ICD-10-CM

## 2022-03-22 DIAGNOSIS — I4891 Unspecified atrial fibrillation: Secondary | ICD-10-CM | POA: Diagnosis not present

## 2022-03-22 DIAGNOSIS — M810 Age-related osteoporosis without current pathological fracture: Secondary | ICD-10-CM

## 2022-03-22 DIAGNOSIS — F32A Depression, unspecified: Secondary | ICD-10-CM

## 2022-03-22 DIAGNOSIS — E1159 Type 2 diabetes mellitus with other circulatory complications: Secondary | ICD-10-CM

## 2022-04-03 ENCOUNTER — Ambulatory Visit (INDEPENDENT_AMBULATORY_CARE_PROVIDER_SITE_OTHER): Payer: PPO | Admitting: Family Medicine

## 2022-04-03 ENCOUNTER — Encounter: Payer: Self-pay | Admitting: Family Medicine

## 2022-04-03 VITALS — BP 110/70 | HR 70 | Temp 97.8°F | Ht 65.0 in

## 2022-04-03 DIAGNOSIS — E119 Type 2 diabetes mellitus without complications: Secondary | ICD-10-CM | POA: Diagnosis not present

## 2022-04-03 DIAGNOSIS — I4821 Permanent atrial fibrillation: Secondary | ICD-10-CM | POA: Diagnosis not present

## 2022-04-03 DIAGNOSIS — I119 Hypertensive heart disease without heart failure: Secondary | ICD-10-CM | POA: Diagnosis not present

## 2022-04-03 LAB — POCT GLYCOSYLATED HEMOGLOBIN (HGB A1C): Hemoglobin A1C: 7.3 % — AB (ref 4.0–5.6)

## 2022-04-03 NOTE — Progress Notes (Signed)
Established Patient Office Visit  Subjective   Patient ID: [name], female    DOB: 01-20-1930  Age: 88 y.o. MRN: [id number]  Chief Complaint  Patient presents with   Follow-up    HPI   Ms. Minhas is here for medical follow-up.  She was seen few months ago and had follow-up labs with A1c 7.6%.  We discussed medical therapy versus lifestyle modification and she prefers the latter.  Main adjustments she has made since then has been reducing her intake of frappe's.  In discussing her diet in more detail she frequently eats sweetened cereal for breakfast.  Also drinks chocolate milk frequently and eats nabs frequently for snacks.  She does get Meals on Wheels usually most weekdays.  Those meals usually include at least 1 starch.  She does occasionally check her blood sugars and these are usually around 130 fasting  Her other medical problems include hypertension, CAD, atrial fibrillation, obstructive sleep apnea, hyperlipidemia, gout.  She has some chronic lower extremity edema but overall stable.  Her current medications include Eliquis, atorvastatin, Lasix, metoprolol tartrate ramipril, and sertraline  Past Medical History:  Diagnosis Date   Anemia    CAD 09/24/2008   AMI in 1998 tx with PCI to LAD;  myoview 1/12:  no ischemia, EF 45%   CARDIOMYOPATHY 09/24/2008   ischemic;  echo 4/12:  EF 20-25%, mild MR, mod LAE, mod reduced RVSF, mod RVE, mild RAE, mild TR, PASP 40   CAROTID BRUIT 09/24/2008   DIABETES MELLITUS 09/24/2008   Edema 09/24/2008   GERD (gastroesophageal reflux disease)    HYPERLIPIDEMIA 09/24/2008   HYPERTENSION 09/24/2008   MI 09/24/2008   Osteopenia    Persistent atrial fibrillation ([place]) 09/24/2008   s/p DCCV x 4 in past;  Amiod d/c'd 2/2 abnormal PFTs;  Tikosyn load 6/12 with DCCV   Tachycardia-bradycardia [facility])    Past Surgical History:  Procedure Laterality Date   CARDIOVERSION N/A 02/03/2015   Procedure: CARDIOVERSION;  Surgeon: Dorothy Spark, MD;  Location: [place];  Service: Cardiovascular;  Laterality: N/A;   CARDIOVERSION N/A 07/08/2018   Procedure: CARDIOVERSION;  Surgeon: Thayer Headings, MD;  Location: [place];  Service: Cardiovascular;  Laterality: N/A;  getting INR prior   ELECTROPHYSIOLOGIC STUDY N/A 10/15/2014   PVI and CTI ablation by Dr Rayann Heman   HIP FRACTURE SURGERY  2006   KNEE SURGERY  2008   broken knee   LOOP RECORDER IMPLANT N/A 11/06/2012   Procedure: LOOP RECORDER IMPLANT;  Surgeon: Thompson Grayer, MD;  Location: [facility] CATH LAB;  Service: Cardiovascular;  Laterality: N/A;Medtronic LinQ implanted by Dr Rayann Heman for palpitaitons and dizziness   TEE WITHOUT CARDIOVERSION N/A 10/15/2014   Procedure: TRANSESOPHAGEAL ECHOCARDIOGRAM (TEE);  Surgeon: Thayer Headings, MD;  Location: [facility] ENDOSCOPY;  Service: Cardiovascular;  Laterality: N/A;    reports that she quit smoking about 25 years ago. Her smoking use included cigarettes. She has a 10.00 pack-year smoking history. She has been exposed to tobacco smoke. She has never used smokeless tobacco. She reports that she does not drink alcohol and does not use drugs. family history includes Alcohol abuse in her brother and father; Brain cancer in her sister; Breast cancer in her mother and sister; Cancer in her brother; Coronary artery disease in an other family member; Diabetes in her mother; Heart attack in her brother and brother; Heart disease in her brother, brother, and mother; Hypertension in an other family member. Allergies  Allergen Reactions   Simvastatin  Rash    Review of Systems  Constitutional:  Negative for malaise/fatigue.  Eyes:  Negative for blurred vision.  Respiratory:  Negative for shortness of breath.   Cardiovascular:  Negative for chest pain.  Neurological:  Negative for dizziness, weakness and headaches.      Objective:     BP 110/70 (BP Location: Left Arm, Patient Position: Sitting, Cuff Size: Large)   Pulse 70   Temp 97.8 F (36.6 C) (Oral)   Ht 5\' 5"   (1.651 m)   SpO2 96%   BMI 33.88 kg/m    Physical Exam Vitals reviewed.  Constitutional:      Appearance: She is well-developed.  Eyes:     Pupils: Pupils are equal, round, and reactive to light.  Neck:     Thyroid: No thyromegaly.     Vascular: No JVD.  Cardiovascular:     Rate and Rhythm: Normal rate and regular rhythm.     Heart sounds:     No gallop.  Pulmonary:     Effort: Pulmonary effort is normal. No respiratory distress.     Breath sounds: Normal breath sounds. No wheezing or rales.  Musculoskeletal:     Cervical back: Neck supple.  Neurological:     Mental Status: She is alert.      Results for orders placed or performed in visit on 04/03/22  POCT glycosylated hemoglobin (Hb A1C)  Result Value Ref Range   Hemoglobin A1C 7.3 (A) 4.0 - 5.6 %   HbA1c POC (<> result, manual entry)     HbA1c, POC (prediabetic range)     HbA1c, POC (controlled diabetic range)        The ASCVD Risk score (Arnett DK, et al., 2019) failed to calculate for the following reasons:   The 2019 ASCVD risk score is only valid for ages 31 to 36   The patient has a prior MI or stroke diagnosis    Assessment & Plan:   #1 type 2 diabetes slightly improved with A1c today 7.3%.  We had a long talk with her regarding medication options versus ongoing lifestyle modification.  She prefers the latter.  Given her age (83) and the fact that she has brought this down slightly over the past few months this seems like a reasonable option.  We have made some specific areas of recommendation.  For example, we recommend giving up sweetened cereals in the morning and favorite things like not unsweetened oatmeal, eggs, or low sugar Greek yogurt.  She will also try to scale back things like chocolate milk consumption. -Set up 97-month follow-up and recheck A1c  #2 hypertension stable and at goal.  Continue current medications with ramipril and metoprolol.  #3 history of atrial fibrillation.  Rate currently  stable.  Continue Eliquis.   Return in about 4 months (around 08/03/2022).    10/03/2022, MD

## 2022-04-03 NOTE — Patient Instructions (Signed)
A1C has improved some from 7.6% to 7.3%  Continue to decrease sugars and starches.

## 2022-04-30 ENCOUNTER — Telehealth: Payer: Self-pay | Admitting: Pharmacist

## 2022-04-30 NOTE — Progress Notes (Signed)
Care Coordination Pharmacy Assistant   Name: [name]  MRN: [id number] DOB: February 26, 1930  Reason for Encounter: Disease State   Conditions to be addressed/monitored: HTN  Recent office visits:  04/03/22 Joanne Covey, MD - Patient presented for Controlled type 2 diabetes mellitus without complication without log term current use of insulin and other concerns.   Recent consult visits:  None   Hospital visits:  None in previous 6 months  Medications: Outpatient Encounter Medications as of 04/30/2022  Medication Sig   acetaminophen (TYLENOL) 325 MG tablet Take 325 mg by mouth 2 (two) times daily as needed for moderate pain or headache.   ammonium lactate (LAC-HYDRIN) 12 % lotion 1 application   apixaban (ELIQUIS) 5 MG TABS tablet Take 1 tablet (5 mg total) by mouth 2 (two) times daily.   atorvastatin (LIPITOR) 10 MG tablet Take 0.5 tablets (5 mg total) by mouth daily after supper.   Blood Glucose Monitoring Suppl (ONE TOUCH ULTRA 2) w/Device KIT Use as directed.   Calcium Carb-Cholecalciferol (CALCIUM 600+D3 PO) Take 1 tablet by mouth daily.   furosemide (LASIX) 40 MG tablet Take 1.5 tablets (60 mg total) by mouth daily. (Patient taking differently: Take 40 mg by mouth daily.)   glucose blood test strip 1 each by Other route as needed for other. Use as instructed with One Touch glucometer.   Magnesium Oxide 400 MG CAPS Take 1 capsule (400 mg total) by mouth daily.   metoprolol tartrate (LOPRESSOR) 100 MG tablet Take 0.5 tablets (50 mg total) by mouth 2 (two) times daily.   mometasone (ELOCON) 0.1 % lotion Apply topically daily.   Multiple Vitamin (MULTIVITAMIN WITH MINERALS) TABS tablet Take 1 tablet by mouth daily. One-A-Day   ramipril (ALTACE) 10 MG capsule Take 2 capsules (20 mg total) by mouth daily.   sertraline (ZOLOFT) 25 MG tablet Take 1 tablet (25 mg total) by mouth daily.   vitamin B-12 (CYANOCOBALAMIN) 500 MCG tablet Take 500 mcg by mouth daily.   No  facility-administered encounter medications on file as of 04/30/2022.   Reviewed chart prior to disease state call. Spoke with patient regarding BP  Recent Office Vitals: BP Readings from Last 3 Encounters:  04/03/22 110/70  01/02/22 110/60  08/02/21 120/80   Pulse Readings from Last 3 Encounters:  04/03/22 70  01/02/22 80  08/01/21 73    Wt Readings from Last 3 Encounters:  01/02/22 203 lb 9.6 oz (92.4 kg)  08/02/21 206 lb (93.4 kg)  08/01/21 206 lb (93.4 kg)     Kidney Function Lab Results  Component Value Date/Time   CREATININE 0.91 01/02/2022 11:37 AM   CREATININE 0.79 08/01/2021 11:15 AM   CREATININE 0.57 (L) 03/22/2017 04:44 PM   CREATININE 0.76 01/23/2016 01:11 PM   GFR 55.63 (L) 01/02/2022 11:37 AM   GFRNONAA 61 09/08/2019 01:00 PM   GFRAA 70 09/08/2019 01:00 PM       Latest Ref Rng & Units 01/02/2022   11:37 AM 08/01/2021   11:15 AM 07/19/2020   11:15 AM  BMP  Glucose 70 - 99 mg/dL 027  741  85   BUN 6 - 23 mg/dL 25  27  25    Creatinine 0.40 - 1.20 mg/dL  2.87  8.67   BUN/Creat Ratio 12 - 28  34  30   Sodium 135 - 145 mEq/L 138  140  140   Potassium 3.5 - 5.1 mEq/L 5.0  5.0  4.5   Chloride 96 - 112 mEq/L  99  101  98   CO2 19 - 32 mEq/L 32  28  25   Calcium 8.4 - 10.5 mg/dL 9.2  9.8  10.1     Current antihypertensive regimen:  Furosemide 40 mg 1.5 tablets daily (take 40 mg daily) - Appropriate, Query effective, Safe, Accessible Metoprolol tartrate 100 mg 1/2 tablet twice daily  - Appropriate, Effective, Safe, Accessible Ramipril 10 mg 2 capsule daily - Appropriate, Effective, Safe, Accessible  How often are you checking your Blood Pressure? infrequently Current home BP readings: Patient reports she is not good at checking and has been doing so but not once a week, she reports her pressures are around 110/70 when she does check. What recent interventions/DTPs have been made by any provider to improve Blood Pressure control since last CPP Visit: Patient  reports none Any recent hospitalizations or ED visits since last visit with CPP? No Patient reports since she had her visit with PCP she has been intentional and trying her best to not eat things that will elevate her sugar and are not good for her. She reports she has an aide that helps her and goes with her to appointments. She states they have been working on her meter as her sugar was reading 180 on yesterday and she isnt sure if its the meter or due to her diet changes, she states if it keeps reading low she will think about inquiring about changing to the [place]. at her follow up with Dr [name] in a few months Adherence Review: Is the patient currently on ACE/ARB medication? Yes Does the patient have >5 day gap between last estimated fill dates? No    Care Gaps: COVID Booster - Overdue Foot Exam - Overdue Eye Exam - Overdue TDAP - Overdue Zoster Vaccine - Overdue PNA Vaccine - Postponed DEXA - Postponed AWV- 08/02/21 Bp- 110/70 04/03/22 Lab Results  Component Value Date   HGBA1C 7.3 (A) 04/03/2022    Star Rating Drugs: Atorvastatin (Lipitor) 10 mg - last filled on 03/14/22  45 DS at [facility] Verified Ramipril (Altace) 10 mg - last filled on 02/26/21 90DS at [facility] Pharmacist Assistant [phone]

## 2022-06-05 ENCOUNTER — Telehealth: Payer: Self-pay | Admitting: Student

## 2022-06-05 MED ORDER — ATORVASTATIN CALCIUM 10 MG PO TABS: 5.0000 mg | ORAL_TABLET | Freq: Every day | ORAL | 0 refills | Status: DC

## 2022-06-05 MED ORDER — METOPROLOL TARTRATE 100 MG PO TABS: 50.0000 mg | ORAL_TABLET | Freq: Two times a day (BID) | ORAL | 0 refills | Status: DC

## 2022-06-05 MED ORDER — RAMIPRIL 10 MG PO CAPS: 20.0000 mg | ORAL_CAPSULE | Freq: Every day | ORAL | 0 refills | Status: DC

## 2022-06-05 NOTE — Telephone Encounter (Signed)
*  STAT* If patient is at the pharmacy, call can be transferred to refill team.   1. Which medications need to be refilled? (please list name of each medication and dose if known)  metoprolol tartrate (LOPRESSOR) 100 MG tablet  atorvastatin (LIPITOR) 10 MG tablet  ramipril (ALTACE) 10 MG capsule   2. Which pharmacy/location (including street and city if local pharmacy) is medication to be sent to? [facility] [employer], [place]   3. Do they need a 30 day or 90 day supply? 90 day    Patient is running low on medication.

## 2022-06-05 NOTE — Telephone Encounter (Signed)
Pt's medications were sent to pt's pharmacy as requested. Confirmation received.  

## 2022-06-12 ENCOUNTER — Telehealth: Payer: Self-pay

## 2022-06-12 NOTE — Progress Notes (Unsigned)
Patient ID: [name], female   DOB: 12-25-1929, 89 y.o.   MRN: [id number]  Care Management & Coordination Services Pharmacy Team  Reason for Encounter: Diabetes  Contacted patient to discuss diabetes disease state. Per Lyanne Co {US HC Outreach:[id number]}  Current antihyperglycemic regimen:  None   Patient verbally confirms she is taking the above medications as directed. {yes/no:20286}  What diet changes have been made to improve diabetes control?  What recent interventions/DTPs have been made to improve glycemic control:  ***  Have there been any recent hospitalizations or ED visits since last visit with PharmD? {yes/no:20286}  Patient {reports/denies:24182} hypoglycemic symptoms, including {Hypoglycemic Symptoms:3049003}  Patient {reports/denies:24182} hyperglycemic symptoms, including {symptoms; hyperglycemia:17903}  How often are you checking your blood sugar? {BG Testing frequency:23922}  What are your blood sugars ranging?  Fasting: *** Before meals: *** After meals: *** Bedtime: ***  During the week, how often does your blood glucose drop below 70? {LowBGfrequency:24142}  Are you checking your feet daily/regularly? {yes/no:20286}  Adherence Review: Is the patient currently on a STATIN medication? {yes/no:20286} Is the patient currently on ACE/ARB medication? {yes/no:20286} Does the patient have >5 day gap between last estimated fill dates? {yes/no:20286}  [name] in May   Chart Updates:  Recent office visits:  None  Recent consult visits:  None  Hospital visits:  None in previous 6 months  Medications: Outpatient Encounter Medications as of 06/12/2022  Medication Sig   acetaminophen (TYLENOL) 325 MG tablet Take 325 mg by mouth 2 (two) times daily as needed for moderate pain or headache.   ammonium lactate (LAC-HYDRIN) 12 % lotion 1 application   apixaban (ELIQUIS) 5 MG TABS tablet Take 1 tablet (5 mg total) by mouth 2 (two) times daily.    atorvastatin (LIPITOR) 10 MG tablet Take 0.5 tablets (5 mg total) by mouth daily after supper.   Blood Glucose Monitoring Suppl (ONE TOUCH ULTRA 2) w/Device KIT Use as directed.   Calcium Carb-Cholecalciferol (CALCIUM 600+D3 PO) Take 1 tablet by mouth daily.   furosemide (LASIX) 40 MG tablet Take 1.5 tablets (60 mg total) by mouth daily. (Patient taking differently: Take 40 mg by mouth daily.)   glucose blood test strip 1 each by Other route as needed for other. Use as instructed with One Touch glucometer.   Magnesium Oxide 400 MG CAPS Take 1 capsule (400 mg total) by mouth daily.   metoprolol tartrate (LOPRESSOR) 100 MG tablet Take 0.5 tablets (50 mg total) by mouth 2 (two) times daily.   mometasone (ELOCON) 0.1 % lotion Apply topically daily.   Multiple Vitamin (MULTIVITAMIN WITH MINERALS) TABS tablet Take 1 tablet by mouth daily. One-A-Day   ramipril (ALTACE) 10 MG capsule Take 2 capsules (20 mg total) by mouth daily.   sertraline (ZOLOFT) 25 MG tablet Take 1 tablet (25 mg total) by mouth daily.   vitamin B-12 (CYANOCOBALAMIN) 500 MCG tablet Take 500 mcg by mouth daily.   No facility-administered encounter medications on file as of 06/12/2022.    Recent Relevant Labs: Lab Results  Component Value Date/Time   HGBA1C 7.3 (A) 04/03/2022 11:15 AM   HGBA1C 7.6 (H) 01/02/2022 11:37 AM   HGBA1C 6.5 (A) 01/03/2021 08:48 AM   HGBA1C 6.5 (H) 07/19/2020 11:15 AM    Kidney Function Lab Results  Component Value Date/Time   CREATININE 0.91 01/02/2022 11:37 AM   CREATININE 0.79 08/01/2021 11:15 AM   CREATININE 0.57 (L) 03/22/2017 04:44 PM   CREATININE 0.76 01/23/2016 01:11 PM   GFR 55.63 (L) 01/02/2022  11:37 AM   GFRNONAA 61 09/08/2019 01:00 PM   GFRAA 70 09/08/2019 01:00 PM    Star Rating Drugs:  Atorvastatin (Lipitor) 10 mg - last filled on 06/05/22 45 DS at [facility]  Ramipril (Altace) 10 mg - last filled on 06/05/22 90 DS at [place] Zoster Vaccine - Overdue AWV- 08/02/21 Pna Vaccine - Overdue DEXA - [place] Clinical Pharmacist Assistant [phone]

## 2022-07-31 ENCOUNTER — Ambulatory Visit: Payer: PPO | Admitting: Family Medicine

## 2022-08-06 ENCOUNTER — Telehealth: Payer: Self-pay

## 2022-08-06 ENCOUNTER — Ambulatory Visit (INDEPENDENT_AMBULATORY_CARE_PROVIDER_SITE_OTHER): Payer: PPO

## 2022-08-06 VITALS — Ht 65.0 in | Wt 203.0 lb

## 2022-08-06 DIAGNOSIS — Z0000 Encounter for general adult medical examination without abnormal findings: Secondary | ICD-10-CM

## 2022-08-06 NOTE — Telephone Encounter (Signed)
Error- please disregard

## 2022-08-06 NOTE — Telephone Encounter (Signed)
Unsuccessful attempts to reach patient on preferred number listed in notes for scheduled AWV. Left message on voicemail okay to reschedule.

## 2022-08-06 NOTE — Patient Instructions (Addendum)
[name] , Thank you for taking time to come for your Medicare Wellness Visit. I appreciate your ongoing commitment to your health goals. Please review the following plan we discussed and let me know if I can assist you in the future.   These are the goals we discussed:  Goals       No Current goals (pt-stated)      Patient Stated (pt-stated)      I would love to Walk more.         This is a list of the screening recommended for you and due dates:  Health Maintenance  Topic Date Due   Complete foot exam   Never done   DTaP/Tdap/Td vaccine (1 - Tdap) Never done   Eye exam for diabetics  08/06/2022*   COVID-19 Vaccine (1) 08/22/2022*   Zoster (Shingles) Vaccine (1 of 2) 11/05/2022*   Pneumonia Vaccine (2 of 2 - PPSV23 or PCV20) 08/06/2023*   DEXA scan (bone density measurement)  08/06/2023*   Hemoglobin A1C  10/03/2022   Flu Shot  11/22/2022   Medicare Annual Wellness Visit  08/06/2023   HPV Vaccine  Aged Out  *Topic was postponed. The date shown is not the original due date.    Advanced directives: Please bring a copy of your health care power of attorney and living will to the office to be added to your chart at your convenience.   Conditions/risks identified: None  Next appointment: Follow up in one year for your annual wellness visit    Preventive Care 65 Years and Older, Female Preventive care refers to lifestyle choices and visits with your health care provider that can promote health and wellness. What does preventive care include? A yearly physical exam. This is also called an annual well check. Dental exams once or twice a year. Routine eye exams. Ask your health care provider how often you should have your eyes checked. Personal lifestyle choices, including: Daily care of your teeth and gums. Regular physical activity. Eating a healthy diet. Avoiding tobacco and drug use. Limiting alcohol use. Practicing safe sex. Taking low-dose aspirin every day. Taking  vitamin and mineral supplements as recommended by your health care provider. What happens during an annual well check? The services and screenings done by your health care provider during your annual well check will depend on your age, overall health, lifestyle risk factors, and family history of disease. Counseling  Your health care provider may ask you questions about your: Alcohol use. Tobacco use. Drug use. Emotional well-being. Home and relationship well-being. Sexual activity. Eating habits. History of falls. Memory and ability to understand (cognition). Work and work Astronomer. Reproductive health. Screening  You may have the following tests or measurements: Height, weight, and BMI. Blood pressure. Lipid and cholesterol levels. These may be checked every 5 years, or more frequently if you are over 12 years old. Skin check. Lung cancer screening. You may have this screening every year starting at age 79 if you have a 30-pack-year history of smoking and currently smoke or have quit within the past 15 years. Fecal occult blood test (FOBT) of the stool. You may have this test every year starting at age 74. Flexible sigmoidoscopy or colonoscopy. You may have a sigmoidoscopy every 5 years or a colonoscopy every 10 years starting at age 65. Hepatitis C blood test. Hepatitis B blood test. Sexually transmitted disease (STD) testing. Diabetes screening. This is done by checking your blood sugar (glucose) after you have not eaten for  a while (fasting). You may have this done every 1-3 years. Bone density scan. This is done to screen for osteoporosis. You may have this done starting at age 11. Mammogram. This may be done every 1-2 years. Talk to your health care provider about how often you should have regular mammograms. Talk with your health care provider about your test results, treatment options, and if necessary, the need for more tests. Vaccines  Your health care provider may  recommend certain vaccines, such as: Influenza vaccine. This is recommended every year. Tetanus, diphtheria, and acellular pertussis (Tdap, Td) vaccine. You may need a Td booster every 10 years. Zoster vaccine. You may need this after age 101. Pneumococcal 13-valent conjugate (PCV13) vaccine. One dose is recommended after age 41. Pneumococcal polysaccharide (PPSV23) vaccine. One dose is recommended after age 93. Talk to your health care provider about which screenings and vaccines you need and how often you need them. This information is not intended to replace advice given to you by your health care provider. Make sure you discuss any questions you have with your health care provider. Document Released: 05/06/2015 Document Revised: 12/28/2015 Document Reviewed: 02/08/2015 Elsevier Interactive Patient Education  2017 [place] Prevention in the Home Falls can cause injuries. They can happen to people of all ages. There are many things you can do to make your home safe and to help prevent falls. What can I do on the outside of my home? Regularly fix the edges of walkways and driveways and fix any cracks. Remove anything that might make you trip as you walk through a door, such as a raised step or threshold. Trim any bushes or trees on the path to your home. Use bright outdoor lighting. Clear any walking paths of anything that might make someone trip, such as rocks or tools. Regularly check to see if handrails are loose or broken. Make sure that both sides of any steps have handrails. Any raised decks and porches should have guardrails on the edges. Have any leaves, snow, or ice cleared regularly. Use sand or salt on walking paths during winter. Clean up any spills in your garage right away. This includes oil or grease spills. What can I do in the bathroom? Use night lights. Install grab bars by the toilet and in the tub and shower. Do not use towel bars as grab bars. Use non-skid  mats or decals in the tub or shower. If you need to sit down in the shower, use a plastic, non-slip stool. Keep the floor dry. Clean up any water that spills on the floor as soon as it happens. Remove soap buildup in the tub or shower regularly. Attach bath mats securely with double-sided non-slip rug tape. Do not have throw rugs and other things on the floor that can make you trip. What can I do in the bedroom? Use night lights. Make sure that you have a light by your bed that is easy to reach. Do not use any sheets or blankets that are too big for your bed. They should not hang down onto the floor. Have a firm chair that has side arms. You can use this for support while you get dressed. Do not have throw rugs and other things on the floor that can make you trip. What can I do in the kitchen? Clean up any spills right away. Avoid walking on wet floors. Keep items that you use a lot in easy-to-reach places. If you need to reach something above  you, use a strong step stool that has a grab bar. Keep electrical cords out of the way. Do not use floor polish or wax that makes floors slippery. If you must use wax, use non-skid floor wax. Do not have throw rugs and other things on the floor that can make you trip. What can I do with my stairs? Do not leave any items on the stairs. Make sure that there are handrails on both sides of the stairs and use them. Fix handrails that are broken or loose. Make sure that handrails are as long as the stairways. Check any carpeting to make sure that it is firmly attached to the stairs. Fix any carpet that is loose or worn. Avoid having throw rugs at the top or bottom of the stairs. If you do have throw rugs, attach them to the floor with carpet tape. Make sure that you have a light switch at the top of the stairs and the bottom of the stairs. If you do not have them, ask someone to add them for you. What else can I do to help prevent falls? Wear shoes  that: Do not have high heels. Have rubber bottoms. Are comfortable and fit you well. Are closed at the toe. Do not wear sandals. If you use a stepladder: Make sure that it is fully opened. Do not climb a closed stepladder. Make sure that both sides of the stepladder are locked into place. Ask someone to hold it for you, if possible. Clearly mark and make sure that you can see: Any grab bars or handrails. First and last steps. Where the edge of each step is. Use tools that help you move around (mobility aids) if they are needed. These include: Canes. Walkers. Scooters. Crutches. Turn on the lights when you go into a dark area. Replace any light bulbs as soon as they burn out. Set up your furniture so you have a clear path. Avoid moving your furniture around. If any of your floors are uneven, fix them. If there are any pets around you, be aware of where they are. Review your medicines with your doctor. Some medicines can make you feel dizzy. This can increase your chance of falling. Ask your doctor what other things that you can do to help prevent falls. This information is not intended to replace advice given to you by your health care provider. Make sure you discuss any questions you have with your health care provider. Document Released: 02/03/2009 Document Revised: 09/15/2015 Document Reviewed: 05/14/2014 Elsevier Interactive Patient Education  2017 [employer].

## 2022-08-06 NOTE — Progress Notes (Signed)
Subjective:   [name] is a 89 y.o. female who presents for Medicare Annual (Subsequent) preventive examination.  Review of Systems    Virtual Visit via Telephone Note  I connected with  [name] on 08/06/22 at  3:00 PM EDT by telephone and verified that I am speaking with the correct person using two identifiers.  Location: Patient: Home Provider: Office Persons participating in the virtual visit: patient/Nurse Health Advisor   I discussed the limitations, risks, security and privacy concerns of performing an evaluation and management service by telephone and the availability of in person appointments. The patient expressed understanding and agreed to proceed.  Interactive audio and video telecommunications were attempted between this nurse and patient, however failed, due to patient having technical difficulties OR patient did not have access to video capability.  We continued and completed visit with audio only.  Some vital signs may be absent or patient reported.   Tillie Rung, LPN  Cardiac Risk Factors include: advanced age (>76men, >37 women);diabetes mellitus;hypertension     Objective:    Today's Vitals   08/06/22 1547  Weight: 203 lb (92.1 kg)  Height: 5\' 5"  (1.651 m)   Body mass index is 33.78 kg/m.     08/06/2022    3:54 PM 08/02/2021   11:48 AM 03/23/2021    7:39 AM 10/03/2020    3:42 PM 07/27/2020   10:24 AM 12/27/2018    1:53 PM 07/08/2018   10:46 AM  Advanced Directives  Does Patient Have a Medical Advance Directive? Yes Yes No No Yes No No  Type of Estate agent of [place];Living will Living will;Healthcare Power of [employer] Power of Attorney    Does patient want to make changes to medical advance directive?  No - Patient declined       Copy of Healthcare Power of Attorney in Chart? No - copy requested    No - copy requested    Would patient like information on creating a medical advance directive?  No -  Patient declined  No - Patient declined  No - Patient declined No - Patient declined    Current Medications (verified) Outpatient Encounter Medications as of 08/06/2022  Medication Sig   acetaminophen (TYLENOL) 325 MG tablet Take 325 mg by mouth 2 (two) times daily as needed for moderate pain or headache.   ammonium lactate (LAC-HYDRIN) 12 % lotion 1 application   apixaban (ELIQUIS) 5 MG TABS tablet Take 1 tablet (5 mg total) by mouth 2 (two) times daily.   atorvastatin (LIPITOR) 10 MG tablet Take 0.5 tablets (5 mg total) by mouth daily after supper.   Blood Glucose Monitoring Suppl (ONE TOUCH ULTRA 2) w/Device KIT Use as directed.   Calcium Carb-Cholecalciferol (CALCIUM 600+D3 PO) Take 1 tablet by mouth daily.   furosemide (LASIX) 40 MG tablet Take 1.5 tablets (60 mg total) by mouth daily. (Patient taking differently: Take 40 mg by mouth daily.)   glucose blood test strip 1 each by Other route as needed for other. Use as instructed with One Touch glucometer.   Magnesium Oxide 400 MG CAPS Take 1 capsule (400 mg total) by mouth daily.   metoprolol tartrate (LOPRESSOR) 100 MG tablet Take 0.5 tablets (50 mg total) by mouth 2 (two) times daily.   mometasone (ELOCON) 0.1 % lotion Apply topically daily.   Multiple Vitamin (MULTIVITAMIN WITH MINERALS) TABS tablet Take 1 tablet by mouth daily. One-A-Day   ramipril (ALTACE) 10 MG capsule  Take 2 capsules (20 mg total) by mouth daily.   sertraline (ZOLOFT) 25 MG tablet Take 1 tablet (25 mg total) by mouth daily.   vitamin B-12 (CYANOCOBALAMIN) 500 MCG tablet Take 500 mcg by mouth daily.   No facility-administered encounter medications on file as of 08/06/2022.    Allergies (verified) Simvastatin   History: Past Medical History:  Diagnosis Date   Anemia    CAD 09/24/2008   AMI in 1998 tx with PCI to LAD;  myoview 1/12:  no ischemia, EF 45%   CARDIOMYOPATHY 09/24/2008   ischemic;  echo 4/12:  EF 20-25%, mild MR, mod LAE, mod reduced RVSF, mod RVE,  mild RAE, mild TR, PASP 40   CAROTID BRUIT 09/24/2008   DIABETES MELLITUS 09/24/2008   Edema 09/24/2008   GERD (gastroesophageal reflux disease)    HYPERLIPIDEMIA 09/24/2008   HYPERTENSION 09/24/2008   MI 09/24/2008   Osteopenia    Persistent atrial fibrillation 09/24/2008   s/p DCCV x 4 in past;  Amiod d/c'd 2/2 abnormal PFTs;  Tikosyn load 6/12 with DCCV   Tachycardia-bradycardia    Past Surgical History:  Procedure Laterality Date   CARDIOVERSION N/A 02/03/2015   Procedure: CARDIOVERSION;  Surgeon: Lars Masson, MD;  Location: [facility] ENDOSCOPY;  Service: Cardiovascular;  Laterality: N/A;   CARDIOVERSION N/A 07/08/2018   Procedure: CARDIOVERSION;  Surgeon: Vesta Mixer, MD;  Location: [facility] ENDOSCOPY;  Service: Cardiovascular;  Laterality: N/A;  getting INR prior   ELECTROPHYSIOLOGIC STUDY N/A 10/15/2014   PVI and CTI ablation by Dr Johney Frame   HIP FRACTURE SURGERY  2006   KNEE SURGERY  2008   broken knee   LOOP RECORDER IMPLANT N/A 11/06/2012   Procedure: LOOP RECORDER IMPLANT;  Surgeon: Hillis Range, MD;  Location: [facility] CATH LAB;  Service: Cardiovascular;  Laterality: N/A;Medtronic LinQ implanted by Dr Johney Frame for palpitaitons and dizziness   TEE WITHOUT CARDIOVERSION N/A 10/15/2014   Procedure: TRANSESOPHAGEAL ECHOCARDIOGRAM (TEE);  Surgeon: Vesta Mixer, MD;  Location: [facility] ENDOSCOPY;  Service: Cardiovascular;  Laterality: N/A;   Family History  Problem Relation Age of Onset   Breast cancer Mother    Heart disease Mother    Diabetes Mother    Alcohol abuse Father    Breast cancer Sister    Brain cancer Sister        cause of death at 59 years old   Alcohol abuse Brother    Heart disease Brother    Heart disease Brother    Cancer Brother        unknown type   Coronary artery disease Other    Hypertension Other    Heart attack Brother    Heart attack Brother    Social History   Socioeconomic History   Marital status: Widowed    Spouse name: Not on file   Number of children: Not on  file   Years of education: Not on file   Highest education level: Not on file  Occupational History   Not on file  Tobacco Use   Smoking status: Former    Packs/day: 0.50    Years: 20.00    Additional pack years: 0.00    Total pack years: 10.00    Types: Cigarettes    Quit date: 07/11/1996    Years since quitting: 26.0    Passive exposure: Past   Smokeless tobacco: Never  Vaping Use   Vaping Use: Not on file  Substance and Sexual Activity   Alcohol use: No   Drug  use: No   Sexual activity: Not Currently  Other Topics Concern   Not on file  Social History Narrative   Not on file   Social Determinants of Health   Financial Resource Strain: Low Risk  (08/06/2022)   Overall Financial Resource Strain (CARDIA)    Difficulty of Paying Living Expenses: Not hard at all  Food Insecurity: No Food Insecurity (08/06/2022)   Hunger Vital Sign    Worried About Running Out of Food in the Last Year: Never true    Ran Out of Food in the Last Year: Never true  Transportation Needs: No Transportation Needs (08/06/2022)   PRAPARE - Administrator, Civil Service (Medical): No    Lack of Transportation (Non-Medical): No  Physical Activity: Inactive (08/06/2022)   Exercise Vital Sign    Days of Exercise per Week: 0 days    Minutes of Exercise per Session: 0 min  Stress: No Stress Concern Present (08/06/2022)   [employer] of Occupational Health - Occupational Stress Questionnaire    Feeling of Stress : Not at all  Social Connections: Moderately Integrated (08/06/2022)   Social Connection and Isolation Panel [NHANES]    Frequency of Communication with Friends and Family: More than three times a week    Frequency of Social Gatherings with Friends and Family: More than three times a week    Attends Religious Services: More than 4 times per year    Active Member of [employer] or Organizations: Yes    Attends Banker Meetings: More than 4 times per year    Marital Status:  Widowed    Tobacco Counseling Counseling given: Not Answered   Clinical Intake:  Pre-visit preparation completed: No  Pain : No/denies pain     BMI - recorded: 33.78 Nutritional Status: BMI > 30  Obese Nutritional Risks: None Diabetes: No  How often do you need to have someone help you when you read instructions, pamphlets, or other written materials from your doctor or pharmacy?: 1 - Never  Diabetic? Yes  Interpreter Needed?: NoNutrition Risk Assessment:  Has the patient had any N/V/D within the last 2 months?  No  Does the patient have any non-healing wounds?  No  Has the patient had any unintentional weight loss or weight gain?  No   Diabetes:  Is the patient diabetic?  Yes  If diabetic, was a CBG obtained today?  No  Did the patient bring in their glucometer from home?  No  How often do you monitor your CBG's? PRN.   Financial Strains and Diabetes Management:  Are you having any financial strains with the device, your supplies or your medication? No .  Does the patient want to be seen by Chronic Care Management for management of their diabetes?  No  Would the patient like to be referred to a Nutritionist or for Diabetic Management?  No   Diabetic Exams:  Diabetic Eye Exam: Completed . Overdue for diabetic eye exam. Pt has been advised about the importance in completing this exam. A referral has been placed today. Message sent to referral coordinator for scheduling purposes. Advised pt to expect a call from office referred to regarding appt.  Diabetic Foot Exam: Completed . Pt has been advised about the importance in completing this exam. Pt is scheduled for diabetic foot exam on Followed by PCP.    Information entered by :: Theresa Mulligan LPN   Activities of Daily Living    08/06/2022    3:53 PM  In your present state of health, do you have any difficulty performing the following activities:  Hearing? 0  Vision? 0  Difficulty concentrating or making  decisions? 0  Walking or climbing stairs? 0  Dressing or bathing? 0  Doing errands, shopping? 0  Preparing Food and eating ? N  Using the Toilet? N  In the past six months, have you accidently leaked urine? N  Do you have problems with loss of bowel control? N  Managing your Medications? N  Managing your Finances? N  Housekeeping or managing your Housekeeping? N    Patient Care Team: [name], MD as PCP - General (Family Medicine) Hillis Range, MD (Inactive) as PCP - Electrophysiology (Cardiology) Verner Chol, [facility] (Inactive) as Pharmacist (Pharmacist)  Indicate any recent Medical Services you may have received from other than [facility] providers in the past year (date may be approximate).     Assessment:   This is a routine wellness examination for [name].  Hearing/Vision screen Hearing Screening - Comments:: Denies hearing difficulties   Vision Screening - Comments:: Wears rx glasses - up to date with routine eye exams with  [facility]  Dietary issues and exercise activities discussed: Exercise limited by: None identified   Goals Addressed               This Visit's Progress     No Current goals (pt-stated)         Depression Screen    08/06/2022    3:52 PM 08/02/2021   11:42 AM 07/27/2020   10:22 AM 03/13/2018   11:45 AM 10/29/2014    9:53 AM  PHQ 2/9 Scores  PHQ - 2 Score 0 0 0 0 0    Fall Risk    08/06/2022    3:53 PM 08/02/2021   11:47 AM 07/27/2020   10:25 AM 03/13/2019    2:14 PM 10/29/2014    9:53 AM  Fall Risk   Falls in the past year? 0 0 0 0 No  Comment    Emmi Telephone Survey: data to providers prior to load   Number falls in past yr: 0 0 0    Injury with Fall? 0 0 0    Risk for fall due to : No Fall Risks No Fall Risks Impaired vision;Impaired mobility;Impaired balance/gait    Follow up Falls prevention discussed  Falls prevention discussed      FALL RISK PREVENTION PERTAINING TO THE HOME:  Any stairs in or around the  home? Yes  If so, are there any without handrails? No Home free of loose throw rugs in walkways, pet beds, electrical cords, etc? Yes  Adequate lighting in your home to reduce risk of falls? Yes   ASSISTIVE DEVICES UTILIZED TO PREVENT FALLS:  Life alert? No  Use of a cane, walker or w/c? Yes  Grab bars in the bathroom? No  Shower chair or bench in shower? Yes  Elevated toilet seat or a handicapped toilet? Yes   TIMED UP AND GO:  Was the test performed? No . Audio Visit  Cognitive Function:        08/06/2022    3:54 PM 08/02/2021   11:50 AM 07/27/2020   10:30 AM  6CIT Screen  What Year? 0 points 0 points 0 points  What month? 0 points 0 points 0 points  What time? 0 points 0 points   Count back from 20 0 points 0 points 0 points  Months in reverse 0 points 0  points 0 points  Repeat phrase 0 points 0 points 2 points  Total Score 0 points 0 points     Immunizations Immunization History  Administered Date(s) Administered   Fluad Quad(high Dose 65+) 01/05/2019, 01/03/2021, 01/02/2022   Influenza Split 02/20/2012   Influenza, High Dose Seasonal PF 03/03/2015, 02/21/2018   Influenza,inj,Quad PF,6+ Mos 03/13/2013, 02/08/2014, 02/13/2016, 02/06/2017   Pneumococcal Conjugate-13 04/23/1998, 02/22/2014    TDAP status: Due, Education has been provided regarding the importance of this vaccine. Advised may receive this vaccine at local pharmacy or Health Dept. Aware to provide a copy of the vaccination record if obtained from local pharmacy or Health Dept. Verbalized acceptance and understanding.  Flu Vaccine status: Up to date  Pneumococcal vaccine status: Declined,  Education has been provided regarding the importance of this vaccine but patient still declined. Advised may receive this vaccine at local pharmacy or Health Dept. Aware to provide a copy of the vaccination record if obtained from local pharmacy or Health Dept. Verbalized acceptance and understanding.   Covid-19 vaccine  status: Declined, Education has been provided regarding the importance of this vaccine but patient still declined. Advised may receive this vaccine at local pharmacy or Health Dept.or vaccine clinic. Aware to provide a copy of the vaccination record if obtained from local pharmacy or Health Dept. Verbalized acceptance and understanding.  Qualifies for Shingles Vaccine? Yes   Zostavax completed No   Shingrix Completed?: No.    Education has been provided regarding the importance of this vaccine. Patient has been advised to call insurance company to determine out of pocket expense if they have not yet received this vaccine. Advised may also receive vaccine at local pharmacy or Health Dept. Verbalized acceptance and understanding.  Screening Tests Health Maintenance  Topic Date Due   FOOT EXAM  Never done   DTaP/Tdap/Td (1 - Tdap) Never done   OPHTHALMOLOGY EXAM  08/06/2022 (Originally 09/18/1939)   COVID-19 Vaccine (1) 08/22/2022 (Originally 09/18/1934)   Zoster Vaccines- Shingrix (1 of 2) 11/05/2022 (Originally 09/19/1948)   Pneumonia Vaccine 34+ Years old (2 of 2 - PPSV23 or PCV20) 08/06/2023 (Originally 04/19/2014)   DEXA SCAN  08/06/2023 (Originally 09/18/1996)   HEMOGLOBIN A1C  10/03/2022   INFLUENZA VACCINE  11/22/2022   Medicare Annual Wellness (AWV)  08/06/2023   HPV VACCINES  Aged Out    Health Maintenance  Health Maintenance Due  Topic Date Due   FOOT EXAM  Never done   DTaP/Tdap/Td (1 - Tdap) Never done    Colorectal cancer screening: No longer required.   Mammogram status: No longer required due to Age.  Bone Density status: Ordered Patient declined. Pt provided with contact info and advised to call to schedule appt.  Lung Cancer Screening: (Low Dose CT Chest recommended if Age 4-80 years, 30 pack-year currently smoking OR have quit w/in 15years.) does not qualify.    Additional Screening:  Hepatitis C Screening:  qualify; Completed   Vision Screening: Recommended  annual ophthalmology exams for early detection of glaucoma and other disorders of the eye. Is the patient up to date with their annual eye exam?  Yes  Who is the provider or what is the name of the office in which the patient attends annual eye exams? [facility] If pt is not established with a provider, would they like to be referred to a provider to establish care? No .   Dental Screening: Recommended annual dental exams for proper oral hygiene  Community Resource Referral / Chronic Care  Management:  CRR required this visit?  No   CCM required this visit?  No      Plan:     I have personally reviewed and noted the following in the patient's chart:   Medical and social history Use of alcohol, tobacco or illicit drugs  Current medications and supplements including opioid prescriptions. Patient is not currently taking opioid prescriptions. Functional ability and status Nutritional status Physical activity Advanced directives List of other physicians Hospitalizations, surgeries, and ER visits in previous 12 months Vitals Screenings to include cognitive, depression, and falls Referrals and appointments  In addition, I have reviewed and discussed with patient certain preventive protocols, quality metrics, and best practice recommendations. A written personalized care plan for preventive services as well as general preventive health recommendations were provided to patient.     Tillie Rung, LPN   1/61/0960   Nurse Notes: None

## 2022-08-07 ENCOUNTER — Ambulatory Visit (INDEPENDENT_AMBULATORY_CARE_PROVIDER_SITE_OTHER): Payer: PPO | Admitting: Internal Medicine

## 2022-08-07 ENCOUNTER — Encounter: Payer: Self-pay | Admitting: Internal Medicine

## 2022-08-07 VITALS — BP 110/70 | HR 73 | Temp 98.7°F | Wt 198.5 lb

## 2022-08-07 DIAGNOSIS — E782 Mixed hyperlipidemia: Secondary | ICD-10-CM | POA: Diagnosis not present

## 2022-08-07 DIAGNOSIS — I152 Hypertension secondary to endocrine disorders: Secondary | ICD-10-CM | POA: Diagnosis not present

## 2022-08-07 DIAGNOSIS — E1159 Type 2 diabetes mellitus with other circulatory complications: Secondary | ICD-10-CM

## 2022-08-07 DIAGNOSIS — I4821 Permanent atrial fibrillation: Secondary | ICD-10-CM

## 2022-08-07 DIAGNOSIS — E119 Type 2 diabetes mellitus without complications: Secondary | ICD-10-CM

## 2022-08-07 LAB — HEMOGLOBIN A1C: Hgb A1c MFr Bld: 7.2 % — ABNORMAL HIGH (ref 4.6–6.5)

## 2022-08-07 MED ORDER — APIXABAN 5 MG PO TABS: 5.0000 mg | ORAL_TABLET | Freq: Two times a day (BID) | ORAL | 0 refills | Status: DC

## 2022-08-07 MED ORDER — METOPROLOL TARTRATE 100 MG PO TABS: 50.0000 mg | ORAL_TABLET | Freq: Two times a day (BID) | ORAL | 0 refills | Status: DC

## 2022-08-07 NOTE — Progress Notes (Signed)
Established Patient Office Visit     CC/Reason for Visit: Follow-up chronic conditions, mainly diabetes  HPI: [name] is a 89 y.o. female who is coming in today for the above mentioned reasons. Past Medical History is significant for: Hypertension, hyperlipidemia, obesity, type 2 diabetes, paroxysmal atrial fibrillation on Eliquis.  She has been doing well.  She is here mainly to follow-up on diabetes during her PCPs absence.  Her last A1c was 7.3.  She has been working hard on making dietary changes.  She has eliminated sweetened frappes, sweetened cereals and has switched over to unsweetened almond milk.  She would prefer to avoid medication to treat diabetes if possible.   Past Medical/Surgical History: Past Medical History:  Diagnosis Date   Anemia    CAD 09/24/2008   AMI in 1998 tx with PCI to LAD;  myoview 1/12:  no ischemia, EF 45%   CARDIOMYOPATHY 09/24/2008   ischemic;  echo 4/12:  EF 20-25%, mild MR, mod LAE, mod reduced RVSF, mod RVE, mild RAE, mild TR, PASP 40   CAROTID BRUIT 09/24/2008   DIABETES MELLITUS 09/24/2008   Edema 09/24/2008   GERD (gastroesophageal reflux disease)    HYPERLIPIDEMIA 09/24/2008   HYPERTENSION 09/24/2008   MI 09/24/2008   Osteopenia    Persistent atrial fibrillation 09/24/2008   s/p DCCV x 4 in past;  Amiod d/c'd 2/2 abnormal PFTs;  Tikosyn load 6/12 with DCCV   Tachycardia-bradycardia     Past Surgical History:  Procedure Laterality Date   CARDIOVERSION N/A 02/03/2015   Procedure: CARDIOVERSION;  Surgeon: Lars Masson, MD;  Location: [facility] ENDOSCOPY;  Service: Cardiovascular;  Laterality: N/A;   CARDIOVERSION N/A 07/08/2018   Procedure: CARDIOVERSION;  Surgeon: Vesta Mixer, MD;  Location: [facility] ENDOSCOPY;  Service: Cardiovascular;  Laterality: N/A;  getting INR prior   ELECTROPHYSIOLOGIC STUDY N/A 10/15/2014   PVI and CTI ablation by Dr Johney Frame   HIP FRACTURE SURGERY  2006   KNEE SURGERY  2008   broken knee   LOOP RECORDER IMPLANT N/A  11/06/2012   Procedure: LOOP RECORDER IMPLANT;  Surgeon: Hillis Range, MD;  Location[facility] CATH LAB;  Service: Cardiovascular;  Laterality: N/A;Medtronic LinQ implanted by Dr Johney Frame for palpitaitons and dizziness   TEE WITHOUT CARDIOVERSION N/A 10/15/2014   Procedure: TRANSESOPHAGEAL ECHOCARDIOGRAM (TEE);  Surgeon: Vesta Mixer, MD;  Location: [facility] ENDOSCOPY;  Service: Cardiovascular;  Laterality: N/A;    Social History:  reports that she quit smoking about 26 years ago. Her smoking use included cigarettes. She has a 10.00 pack-year smoking history. She has been exposed to tobacco smoke. She has never used smokeless tobacco. She reports that she does not drink alcohol and does not use drugs.  Allergies: Allergies  Allergen Reactions   Simvastatin Rash    Family History:  Family History  Problem Relation Age of Onset   Breast cancer Mother    Heart disease Mother    Diabetes Mother    Alcohol abuse Father    Breast cancer Sister    Brain cancer Sister        cause of death at 36 years old   Alcohol abuse Brother    Heart disease Brother    Heart disease Brother    Cancer Brother        unknown type   Coronary artery disease Other    Hypertension Other    Heart attack Brother    Heart attack Brother      Current  Outpatient Medications:    acetaminophen (TYLENOL) 325 MG tablet, Take 325 mg by mouth 2 (two) times daily as needed for moderate pain or headache., Disp: , Rfl:    ammonium lactate (LAC-HYDRIN) 12 % lotion, 1 application, Disp: , Rfl:    atorvastatin (LIPITOR) 10 MG tablet, Take 0.5 tablets (5 mg total) by mouth daily after supper., Disp: 45 tablet, Rfl: 0   Blood Glucose Monitoring Suppl (ONE TOUCH ULTRA 2) w/Device KIT, Use as directed., Disp: 1 kit, Rfl: 0   Calcium Carb-Cholecalciferol (CALCIUM 600+D3 PO), Take 1 tablet by mouth daily., Disp: , Rfl:    furosemide (LASIX) 40 MG tablet, Take 1.5 tablets (60 mg total) by mouth daily. (Patient taking differently: Take  40 mg by mouth daily.), Disp: 180 tablet, Rfl: 2   glucose blood test strip, 1 each by Other route as needed for other. Use as instructed with One Touch glucometer., Disp: 100 each, Rfl: 11   Magnesium Oxide 400 MG CAPS, Take 1 capsule (400 mg total) by mouth daily., Disp: 90 capsule, Rfl: 3   mometasone (ELOCON) 0.1 % lotion, Apply topically daily., Disp: 60 mL, Rfl: 1   Multiple Vitamin (MULTIVITAMIN WITH MINERALS) TABS tablet, Take 1 tablet by mouth daily. One-A-Day, Disp: , Rfl:    ramipril (ALTACE) 10 MG capsule, Take 2 capsules (20 mg total) by mouth daily., Disp: 180 capsule, Rfl: 0   sertraline (ZOLOFT) 25 MG tablet, Take 1 tablet (25 mg total) by mouth daily., Disp: 90 tablet, Rfl: 3   vitamin B-12 (CYANOCOBALAMIN) 500 MCG tablet, Take 500 mcg by mouth daily., Disp: , Rfl:    apixaban (ELIQUIS) 5 MG TABS tablet, Take 1 tablet (5 mg total) by mouth 2 (two) times daily., Disp: 180 tablet, Rfl: 0   metoprolol tartrate (LOPRESSOR) 100 MG tablet, Take 0.5 tablets (50 mg total) by mouth 2 (two) times daily., Disp: 180 tablet, Rfl: 0  Review of Systems:  Negative unless indicated in HPI.   Physical Exam: Vitals:   08/07/22 1009  BP: 110/70  Pulse: 73  Temp: 98.7 F (37.1 C)  TempSrc: Oral  SpO2: 94%  Weight: 198 lb 8 oz (90 kg)    Body mass index is 33.03 kg/m.   Physical Exam Vitals reviewed.  Constitutional:      Appearance: Normal appearance.  HENT:     Head: Normocephalic and atraumatic.  Eyes:     Conjunctiva/sclera: Conjunctivae normal.     Pupils: Pupils are equal, round, and reactive to light.  Cardiovascular:     Rate and Rhythm: Normal rate and regular rhythm.  Pulmonary:     Effort: Pulmonary effort is normal.     Breath sounds: Normal breath sounds.  Skin:    General: Skin is warm and dry.  Neurological:     General: No focal deficit present.     Mental Status: She is alert and oriented to person, place, and time.  Psychiatric:        Mood and Affect:  Mood normal.        Behavior: Behavior normal.        Thought Content: Thought content normal.        Judgment: Judgment normal.      Impression and Plan:  Controlled type 2 diabetes mellitus without complication, without long-term current use of insulin - Plan: Hemoglobin A1c  Permanent atrial fibrillation - Plan: apixaban (ELIQUIS) 5 MG TABS tablet  Mixed hyperlipidemia  Hypertension associated with diabetes - Plan: metoprolol tartrate (LOPRESSOR)  100 MG tablet  -Check A1c today to follow diabetic control.  She is reluctant to start medication and prefers to control with lifestyle changes.  Given her age I think this is acceptable. -Blood pressure is well-controlled.  Toprol has been refilled today. -Apixaban refills given today.  Time spent:32 minutes reviewing chart, interviewing and examining patient and formulating plan of care.     [name], MD [facility]

## 2022-09-03 NOTE — Progress Notes (Unsigned)
  Electrophysiology Office Note:   Date:  09/04/2022  ID:  HARTLEIGH LOSCH, DOB 06-02-1929, MRN [id number]  Primary Cardiologist: None Electrophysiologist: Hillis Range, MD (Inactive)   History of Present Illness:   [name] is a 89 y.o. female with h/o CAD, DM2, Chronic systolic CHF, HLD, HTN, and longstanding persistent AF seen today for routine electrophysiology followup. Since last being seen in our clinic the patient reports doing well overall. She does have some SOB with more than her ADLs, but overall is not very active. Daughter asks about any kind of home visits available. Pt has intermittent swelling in her legs. Mostly sedentary so improves with compression hose.   Review of systems complete and found to be negative unless listed in HPI.   Studies Reviewed:    EKG is ordered today. Personal review shows AF at 71 bpm  Physical Exam:   VS:  BP 102/60   Pulse 71   Ht 5\' 5"  (1.651 m)   Wt 191 lb 12.8 oz (87 kg)   SpO2 92%   BMI 31.92 kg/m    Wt Readings from Last 3 Encounters:  09/04/22 191 lb 12.8 oz (87 kg)  08/07/22 198 lb 8 oz (90 kg)  08/06/22 203 lb (92.1 kg)     GEN: Well nourished, well developed in no acute distress NECK: No JVD; No carotid bruits CARDIAC: Irregularly irregular rate and rhythm, no murmurs, rubs, gallops RESPIRATORY:  Clear to auscultation without rales, wheezing or rhonchi  ABDOMEN: Soft, non-tender, non-distended EXTREMITIES:  No edema; No deformity   ASSESSMENT AND PLAN:    #Longstanding persistent AF EKG today shows AF at 71 bpm Continue eliquis for CHA2DS2VASC of at least 7. Denies bleeding.  Continue rate control with lopressor 50 mg BID.    Chronic systolic CHF TEE in 2016 showed LVEF 20-25% NYHA II-III symptoms (chronic)  Volume status stable on exam  Continue lasix 40 mg daily; sliding scale as needed.  Off potassium with borderline high K in the past. BMET today.  Continue ramipril 20 mg daily Continue lopressor 50 mg  BID Would not challenge with spiro given labile K.  No indication for ICD for primary prevention in her age group.  Defers any invasive work up.    HTN Stable on current regimen    Chronic venous insufficiency Suspect component of lymphedema. Overall stable  Reinforced compression hose and salt/fluid restriction.    DM2 Per PCP.   Follow up with Dr. Elberta Fortis in 12 months  Signed, Graciella Freer, PA-C

## 2022-09-04 ENCOUNTER — Encounter: Payer: Self-pay | Admitting: Student

## 2022-09-04 ENCOUNTER — Ambulatory Visit: Payer: PPO | Attending: Student | Admitting: Student

## 2022-09-04 VITALS — BP 102/60 | HR 71 | Ht 65.0 in | Wt 191.8 lb

## 2022-09-04 DIAGNOSIS — E1159 Type 2 diabetes mellitus with other circulatory complications: Secondary | ICD-10-CM

## 2022-09-04 DIAGNOSIS — I152 Hypertension secondary to endocrine disorders: Secondary | ICD-10-CM

## 2022-09-04 DIAGNOSIS — I4811 Longstanding persistent atrial fibrillation: Secondary | ICD-10-CM | POA: Diagnosis not present

## 2022-09-04 DIAGNOSIS — I5022 Chronic systolic (congestive) heart failure: Secondary | ICD-10-CM

## 2022-09-04 DIAGNOSIS — I10 Essential (primary) hypertension: Secondary | ICD-10-CM

## 2022-09-04 MED ORDER — ATORVASTATIN CALCIUM 10 MG PO TABS: 5.0000 mg | ORAL_TABLET | Freq: Every day | ORAL | 3 refills | Status: DC

## 2022-09-04 MED ORDER — FUROSEMIDE 40 MG PO TABS: 40.0000 mg | ORAL_TABLET | Freq: Every day | ORAL | 3 refills | Status: DC

## 2022-09-04 MED ORDER — METOPROLOL TARTRATE 100 MG PO TABS
50.0000 mg | ORAL_TABLET | Freq: Two times a day (BID) | ORAL | 3 refills | Status: DC
Start: 2022-09-04 — End: 2023-09-03

## 2022-09-04 MED ORDER — RAMIPRIL 10 MG PO CAPS: 20.0000 mg | ORAL_CAPSULE | Freq: Every day | ORAL | 3 refills | Status: DC

## 2022-09-04 NOTE — Patient Instructions (Signed)
Medication Instructions:  Your physician recommends that you continue on your current medications as directed. Please refer to the Current Medication list given to you today.  *If you need a refill on your cardiac medications before your next appointment, please call your pharmacy*  Lab Work: BMET, CBC--TODAY If you have labs (blood work) drawn today and your tests are completely normal, you will receive your results only by: MyChart Message (if you have MyChart) OR A paper copy in the mail If you have any lab test that is abnormal or we need to change your treatment, we will call you to review the results.   Follow-Up: At [facility], you and your health needs are our priority.  As part of our continuing mission to provide you with exceptional heart care, we have created designated Provider Care Teams.  These Care Teams include your primary Cardiologist (physician) and Advanced Practice Providers (APPs -  Physician Assistants and Nurse Practitioners) who all work together to provide you with the care you need, when you need it.  Your next appointment:   1 year(s)  Provider:   Loman Brooklyn, MD

## 2022-09-05 LAB — BASIC METABOLIC PANEL
BUN/Creatinine Ratio: 25 (ref 12–28)
BUN: 19 mg/dL (ref 10–36)
CO2: 27 mmol/L (ref 20–29)
Calcium: 9.7 mg/dL (ref 8.7–10.3)
Chloride: 100 mmol/L (ref 96–106)
Creatinine, Ser: 0.77 mg/dL (ref 0.57–1.00)
Glucose: 123 mg/dL — ABNORMAL HIGH (ref 70–99)
Potassium: 4.3 mmol/L (ref 3.5–5.2)
Sodium: 140 mmol/L (ref 134–144)
eGFR: 73 mL/min/{1.73_m2} (ref >59)

## 2022-09-05 LAB — CBC
Hematocrit: 45.4 % (ref 34.0–46.6)
Hemoglobin: 14.6 g/dL (ref 11.1–15.9)
MCH: 29.8 pg (ref 26.6–33.0)
MCHC: 32.2 g/dL (ref 31.5–35.7)
MCV: 93 fL (ref 79–97)
Platelets: 276 10*3/uL (ref 150–450)
RBC: 4.9 x10E6/uL (ref 3.77–5.28)
RDW: 13.4 % (ref 11.7–15.4)
WBC: 8.5 10*3/uL (ref 3.4–10.8)

## 2022-09-11 ENCOUNTER — Telehealth: Payer: Self-pay

## 2022-09-11 NOTE — Progress Notes (Signed)
Patient ID: [name], female   DOB: 02-10-1930, 89 y.o.   MRN: [id number]  Care Management & Coordination Services Pharmacy Team  Reason for Encounter: Appointment Reminder  Contacted patient to confirm telephone appointment with Milas Kocher, PharmD on 09/12/22 at 1. Unsuccessful outreach. Left voicemail for patient to return call.    Hospital visits:  None in previous 6 months   Star Rating Drugs:  Atorvastatin (Lipitor) 10 mg - last filled on 09/07/22 45 DS at [facility]  Ramipril (Altace) 10 mg - last filled on 09/07/22 90 DS at [facility]   Care Gaps: COVID Booster - Overdue Foot Exam - Overdue Eye Exam - Overdue TDAP - Overdue Zoster Vaccine - Postponed PNA Vaccine - Postponed DEXA - Postponed AWV - 08/06/22  Pamala Duffel CMA Clinical Pharmacist Assistant [phone]

## 2022-09-11 NOTE — Progress Notes (Addendum)
Care Management & Coordination Services Pharmacy Note  09/12/2022 Name:  [name] MRN:  [id number] DOB:  1930-03-13  Summary: BP at goal <140/90 A1C at goal <7.5  Recommendations/Changes made from today's visit: -Counseled patient on signs of low/high BP and routine home BP monitoring -Counseled patient to check weight daily and watch for >3lbs gained in 1 day or >5lbs in 1 week -Counseled on low-carb diet and A1C/sugar goals -Counseled on importance of foot care and annual eye exam  Follow up plan: DM call in 3 months Pharmacist visit in Dec   Subjective: [name] is an 89 y.o. year old female who is a primary patient of [name], Joanne Fortis, MD.  The care coordination team was consulted for assistance with disease management and care coordination needs.    Engaged with patient by telephone for  follow up visit .  Recent office visits: 08/07/22 Joanne Jan, MD - For T2DM. No medication changes  08/06/22 Joanne Mulligan, LPN - For AWV, no medication changes  04/03/22 [name], Joanne Fortis, MD - Patient presented for Controlled type 2 diabetes mellitus without complication without long term current use of insulin and other concerns. No medication changes.   Recent consult visits: 09/04/22 Joanne Glenn, PA-C. For Chronic Systolic Congestive Heart Failure. No medication changes  Hospital visits: None in previous 6 months   Objective:  Lab Results  Component Value Date   CREATININE 0.77 09/04/2022   BUN 19 09/04/2022   GFR 55.63 (L) 01/02/2022   EGFR 73 09/04/2022   GFRNONAA 61 09/08/2019   GFRAA 70 09/08/2019   NA 140 09/04/2022   K 4.3 09/04/2022   CALCIUM 9.7 09/04/2022   CO2 27 09/04/2022   GLUCOSE 123 (H) 09/04/2022    Lab Results  Component Value Date/Time   HGBA1C 7.2 (H) 08/07/2022 10:33 AM   HGBA1C 7.3 (A) 04/03/2022 11:15 AM   HGBA1C 7.6 (H) 01/02/2022 11:37 AM   GFR 55.63 (L) 01/02/2022 11:37 AM   GFR 66.77 06/24/2019 03:55 PM     Last diabetic Eye exam: No results found for: "HMDIABEYEEXA"  Last diabetic Foot exam: No results found for: "HMDIABFOOTEX"   Lab Results  Component Value Date   CHOL 143 01/02/2022   HDL 38.40 (L) 01/02/2022   LDLCALC 80 01/02/2022   TRIG 121.0 01/02/2022   CHOLHDL 4 01/02/2022       Latest Ref Rng & Units 01/02/2022   11:37 AM 01/03/2021    9:33 AM 10/09/2018   10:53 AM  Hepatic Function  Total Protein 6.0 - 8.3 g/dL 7.3  7.1  7.0   Albumin 3.5 - 5.2 g/dL 3.7  3.7  4.3   AST 0 - 37 U/L 17  13  17    ALT 0 - 35 U/L 12  10  11    Alk Phosphatase 39 - 117 U/L 76  68  80   Total Bilirubin 0.2 - 1.2 mg/dL 1.2  1.2  0.9   Bilirubin, Direct 0.0 - 0.3 mg/dL 0.2  0.2      Lab Results  Component Value Date/Time   TSH 1.42 06/24/2019 03:55 PM   TSH 0.52 10/03/2009 09:10 AM       Latest Ref Rng & Units 09/04/2022   11:10 AM 08/01/2021   11:15 AM 07/19/2020   11:15 AM  CBC  WBC 3.4 - 10.8 x10E3/uL 8.5  10.0  9.0   Hemoglobin 11.1 - 15.9 g/dL 46.9  62.9  52.8   Hematocrit 34.0 -  46.6 % 45.4  42.1  43.8   Platelets 150 - 450 x10E3/uL 276  302  301     No results found for: "VD25OH", "VITAMINB12"  Clinical ASCVD: Yes  The ASCVD Risk score (Arnett DK, et al., 2019) failed to calculate for the following reasons:   The 2019 ASCVD risk score is only valid for ages 66 to 16   The patient has a prior MI or stroke diagnosis       08/07/2022   10:15 AM 08/06/2022    3:52 PM 08/02/2021   11:42 AM  Depression screen PHQ 2/9  Decreased Interest  0 0  Down, Depressed, Hopeless 0 0 0  PHQ - 2 Score 0 0 0  Altered sleeping 0    Tired, decreased energy 1    Change in appetite 0    Feeling bad or failure about yourself  0    Trouble concentrating 0    Moving slowly or fidgety/restless 0    Suicidal thoughts 0    PHQ-9 Score 1    Difficult doing work/chores Not difficult at all       Social History   Tobacco Use  Smoking Status Former   Packs/day: 0.50   Years: 20.00    Additional pack years: 0.00   Total pack years: 10.00   Types: Cigarettes   Quit date: 07/11/1996   Years since quitting: 26.1   Passive exposure: Past  Smokeless Tobacco Never   BP Readings from Last 3 Encounters:  09/04/22 102/60  08/07/22 110/70  04/03/22 110/70   Pulse Readings from Last 3 Encounters:  09/04/22 71  08/07/22 73  04/03/22 70   Wt Readings from Last 3 Encounters:  09/04/22 191 lb 12.8 oz (87 kg)  08/07/22 198 lb 8 oz (90 kg)  08/06/22 203 lb (92.1 kg)   BMI Readings from Last 3 Encounters:  09/04/22 31.92 kg/m  08/07/22 33.03 kg/m  08/06/22 33.78 kg/m    Allergies  Allergen Reactions   Simvastatin Rash    Medications Reviewed Today     Reviewed by [name], RPH (Pharmacist) on 09/12/22 at 1309  Med List Status: <None>   Medication Order Taking? Sig Documenting Provider Last Dose Status Informant  acetaminophen (TYLENOL) 325 MG tablet [id number] No Take 325 mg by mouth 2 (two) times daily as needed for moderate pain or headache. [provider] Taking Active Self  ammonium lactate (LAC-HYDRIN) 12 % lotion [id number] No 1 application [provider] Taking Active   apixaban (ELIQUIS) 5 MG TABS tablet [id number] No Take 1 tablet (5 mg total) by mouth 2 (two) times daily. Philip Aspen, Limmie Patricia, MD Taking Active   atorvastatin (LIPITOR) 10 MG tablet [id number]  Take 0.5 tablets (5 mg total) by mouth daily after supper. Graciella Freer, PA-C  Active   Blood Glucose Monitoring Suppl (ONE TOUCH ULTRA 2) w/Device KIT [id number] No Use as directed. [name], MD Taking Active   Calcium Carb-Cholecalciferol (CALCIUM 600+D3 PO) [id number] No Take 1 tablet by mouth daily. [provider] Taking Active Self  furosemide (LASIX) 40 MG tablet [id number]  Take 1 tablet (40 mg total) by mouth daily. Graciella Freer, PA-C  Active   glucose blood test strip [id number] No 1 each by Other route as needed for other.  Use as instructed with One Touch glucometer. [name], MD Taking Active   Magnesium Oxide 400 MG CAPS [id number] No Take 1 capsule (400 mg total) by mouth daily.  Leone Brand, NP Taking Active Self  metoprolol tartrate (LOPRESSOR) 100 MG tablet [id number]  Take 0.5 tablets (50 mg total) by mouth 2 (two) times daily. Graciella Freer, PA-C  Active   mometasone (ELOCON) 0.1 % lotion [id number] No Apply topically daily. [name], MD Taking Active   Multiple Vitamin (MULTIVITAMIN WITH MINERALS) TABS tablet [id number] No Take 1 tablet by mouth daily. One-A-Day [provider] Taking Active Self  ramipril (ALTACE) 10 MG capsule [id number]  Take 2 capsules (20 mg total) by mouth daily. Graciella Freer, PA-C  Active   sertraline (ZOLOFT) 25 MG tablet [id number] No Take 1 tablet (25 mg total) by mouth daily. [name], MD Taking Active   vitamin B-12 (CYANOCOBALAMIN) 500 MCG tablet [id number] No Take 500 mcg by mouth daily. [provider] Taking Active Self            SDOH:  (Social Determinants of Health) assessments and interventions performed: Yes SDOH Interventions    Flowsheet Row Care Coordination from 09/12/2022 in [facility] Clinical Support from 08/06/2022 in [facility] [place] Chronic Care Management from 03/12/2022 in [facility] [place] Office Visit from 08/02/2021 in [facility] [place] Chronic Care Management from 11/26/2019 in [facility] [place]  SDOH Interventions       Food Insecurity Interventions Intervention Not Indicated Intervention Not Indicated -- Intervention Not Indicated --  Housing Interventions Intervention Not Indicated Intervention Not Indicated -- Intervention Not Indicated --  Transportation Interventions -- Intervention Not Indicated -- Intervention Not Indicated Intervention Not Indicated  Utilities  Interventions -- Intervention Not Indicated -- -- --  Alcohol Usage Interventions -- Intervention Not Indicated (Score <7) -- -- --  Financial Strain Interventions -- Intervention Not Indicated Intervention Not Indicated Intervention Not Indicated Intervention Not Indicated  Physical Activity Interventions -- Patient Refused, Other (Comments), Intervention Not Indicated -- Intervention Not Indicated --  Stress Interventions -- Intervention Not Indicated -- Intervention Not Indicated --  Social Connections Interventions -- Intervention Not Indicated -- Intervention Not Indicated --       Medication Assistance: None required.  Patient affirms current coverage meets needs.  Medication Access: Name and location of current pharmacy:  [facility] [address] [place] - [address] [place] [address] Phone: [phone] Fax: [phone]  [facility] [place], [place] - [address] [place] [address] [place] [address] Phone: [phone] Fax: [phone]  [id number] - [place], [place] - [address] [facility] [address] [place] [address] Phone: [phone] Fax: [phone]  Within the past 30 days, how often has patient missed a dose of medication? None Is a pillbox or other method used to improve adherence? Yes  Factors that may affect medication adherence? no barriers identified Are meds synced by current pharmacy? No  Are meds delivered by current pharmacy? Yes  Does patient experience delays in picking up medications due to transportation concerns? No   Compliance/Adherence/Medication fill history: Care Gaps: Foot Exam - never done Eye Exam - never done - reminded patient to sch COVID Vaccine - never done Tdap - never done  Star-Rating Drugs: Atorvastatin 10mg  PDC 97% LF 06/05/22 90DS Ramipril 10mg  PDC 97% LF 06/05/22  90DS   Assessment/Plan Diabetes (A1c goal  <7.5 ) -Controlled -Current medications: None - Diet and Exercise -Medications previously tried: None  -  Current home glucose readings Not checking at home -Denies hypoglycemic/hyperglycemic symptoms -Current meal patterns:  Has made diet changes in the last 6 months and has eliminated frappes from her diet Is mindful of carb intake -Current exercise: Not discussed -Dicussed current eating habits and patient is doing well except for occasional treats. Not inclined to set a strict goal of <7 due to patient's age and desire to stay off medications. -Educated on A1c and blood sugar goals; Carbohydrate counting and/or plate method Counseled to check feet daily and get yearly eye exams -Counseled to check feet daily and get yearly eye exams -Counseled on diet and exercise extensively  Heart Failure (Goal: manage symptoms and prevent exacerbations) -Controlled -Last ejection fraction: 20-25% (Date: 10/15/14) -HF type: HFrEF (EF < 40%) -NYHA Class: III (marked limitation of activity) -AHA HF Stage: C (Heart disease and symptoms present) -Current treatment: Lasix 40mg  1 qd Appropriate, Effective, Safe, Accessible Metoprolol tartrate 100mg  1/2 tab BID Appropriate, Effective, Safe, Accessible Ramipril 10mg  2 qd Appropriate, Effective, Safe, Accessible -Medications previously tried: None  -Current home BP/HR readings: not checking at home -Current home daily weights: not checking -Current dietary habits: mindful of salt intake -Current exercise habits: Not discussed -Educated on Benefits of medications for managing symptoms and prolonging life Importance of weighing daily; if you gain more than 3 pounds in one day or 5 pounds in one week, contact us or cardio Importance of blood pressure control -Counseled on diet and exercise extensively Recommended to continue current medication  [name] Clinical Pharmacist [phone]

## 2022-09-12 ENCOUNTER — Ambulatory Visit: Payer: PPO

## 2022-10-30 ENCOUNTER — Telehealth: Payer: Self-pay

## 2022-10-30 NOTE — Telephone Encounter (Signed)
Pt's daughter called and left message requesting a refill on pt's nitroglycerin. I called pt's daughter back and left message informing daughter that pt no longer takes this medication, it was D/C in 2020. I advised that daughter, if they have any other problems, questions or concerns, to give our office a call back.

## 2022-11-01 ENCOUNTER — Other Ambulatory Visit: Payer: Self-pay

## 2022-11-01 DIAGNOSIS — I4821 Permanent atrial fibrillation: Secondary | ICD-10-CM

## 2022-11-01 MED ORDER — APIXABAN 5 MG PO TABS
5.0000 mg | ORAL_TABLET | Freq: Two times a day (BID) | ORAL | 1 refills | Status: DC
Start: 2022-11-01 — End: 2023-02-25

## 2022-11-01 NOTE — Telephone Encounter (Signed)
Prescription refill request for Eli[facility] received. Indication: Afib  Last office visit:09/04/22 ([place])  Scr: 0.77 (09/04/22)  Age: 89 Weight: 87kg  Appropriate dose. Refill sent.

## 2022-11-02 ENCOUNTER — Other Ambulatory Visit: Payer: Self-pay

## 2022-11-02 DIAGNOSIS — I4821 Permanent atrial fibrillation: Secondary | ICD-10-CM

## 2022-11-21 ENCOUNTER — Encounter (INDEPENDENT_AMBULATORY_CARE_PROVIDER_SITE_OTHER): Payer: Self-pay

## 2022-12-11 ENCOUNTER — Ambulatory Visit: Payer: PPO | Admitting: Family Medicine

## 2022-12-11 VITALS — BP 122/78 | HR 59 | Temp 98.1°F | Ht 65.0 in | Wt 187.8 lb

## 2022-12-11 DIAGNOSIS — I119 Hypertensive heart disease without heart failure: Secondary | ICD-10-CM

## 2022-12-11 DIAGNOSIS — I5022 Chronic systolic (congestive) heart failure: Secondary | ICD-10-CM | POA: Diagnosis not present

## 2022-12-11 DIAGNOSIS — E119 Type 2 diabetes mellitus without complications: Secondary | ICD-10-CM

## 2022-12-11 DIAGNOSIS — I480 Paroxysmal atrial fibrillation: Secondary | ICD-10-CM

## 2022-12-11 LAB — POCT GLYCOSYLATED HEMOGLOBIN (HGB A1C): Hemoglobin A1C: 6.4 % — AB (ref 4.0–5.6)

## 2022-12-11 NOTE — Patient Instructions (Signed)
A1C improved today to 6.4%.  Keep up the good work!  Let's plan on 6 month follow up.

## 2022-12-11 NOTE — Progress Notes (Signed)
Established Patient Office Visit  Subjective   Patient ID: [name], female    DOB: Feb 03, 1930  Age: 89 y.o. MRN: [id number]  Chief Complaint  Patient presents with   Medical Management of Chronic Issues    HPI   Ms. Wilensky is seen today for medical follow-up.  She was seen in the absence back in April for follow-up regarding diabetes.  Her A1c was 7.2%.  No changes were made in her medication at that time.  She has, however made some significant dietary changes.  She was drinking about 3-4 frappe's per week and has reduced that greatly.  She has transition from regular milk to almond milk.  She also has changed from sweetened cereals to higher fiber cereals.  Her weight is down about 11 pounds from last visit and she feels well overall.  Not monitoring blood sugars regularly.  She has history of atrial fibs/flutter.  Remains on Eliquis and metoprolol.  She has hypertension which is treated with metoprolol and ramipril.  Blood pressures have been stable.  Initial blood pressure was up here today but improved after rest.  No recent headaches or dizziness.  She does have history of chronic systolic heart failure.  No recent orthopnea.  Does have some chronic bilateral ankle edema right greater than left.  Very sedentary.  No recent echo.  She did just recently see cardiology in follow-up in May  Past Medical History:  Diagnosis Date   Anemia    CAD 09/24/2008   AMI in 1998 tx with PCI to LAD;  myoview 1/12:  no ischemia, EF 45%   CARDIOMYOPATHY 09/24/2008   ischemic;  echo 4/12:  EF 20-25%, mild MR, mod LAE, mod reduced RVSF, mod RVE, mild RAE, mild TR, PASP 40   CAROTID BRUIT 09/24/2008   DIABETES MELLITUS 09/24/2008   Edema 09/24/2008   GERD (gastroesophageal reflux disease)    HYPERLIPIDEMIA 09/24/2008   HYPERTENSION 09/24/2008   MI 09/24/2008   Osteopenia    Persistent atrial fibrillation (HCC) 09/24/2008   s/p DCCV x 4 in past;  Amiod d/c'd 2/2 abnormal PFTs;  Tikosyn load 6/12 with DCCV    Tachycardia-bradycardia [facility])    Past Surgical History:  Procedure Laterality Date   CARDIOVERSION N/A 02/03/2015   Procedure: CARDIOVERSION;  Surgeon: Lars Masson, MD;  Location: [facility] ENDOSCOPY;  Service: Cardiovascular;  Laterality: N/A;   CARDIOVERSION N/A 07/08/2018   Procedure: CARDIOVERSION;  Surgeon: Vesta Mixer, MD;  Location: [facility] ENDOSCOPY;  Service: Cardiovascular;  Laterality: N/A;  getting INR prior   ELECTROPHYSIOLOGIC STUDY N/A 10/15/2014   PVI and CTI ablation by Dr Johney Frame   HIP FRACTURE SURGERY  2006   KNEE SURGERY  2008   broken knee   LOOP RECORDER IMPLANT N/A 11/06/2012   Procedure: LOOP RECORDER IMPLANT;  Surgeon: Hillis Range, MD;  Location: [facility] CATH LAB;  Service: Cardiovascular;  Laterality: N/A;Medtronic LinQ implanted by Dr Johney Frame for palpitaitons and dizziness   TEE WITHOUT CARDIOVERSION N/A 10/15/2014   Procedure: TRANSESOPHAGEAL ECHOCARDIOGRAM (TEE);  Surgeon: Vesta Mixer, MD;  Location: [facility] ENDOSCOPY;  Service: Cardiovascular;  Laterality: N/A;    reports that she quit smoking about 26 years ago. Her smoking use included cigarettes. She started smoking about 46 years ago. She has a 10 pack-year smoking history. She has been exposed to tobacco smoke. She has never used smokeless tobacco. She reports that she does not drink alcohol and does not use drugs. family history includes Alcohol abuse in her  brother and father; Brain cancer in her sister; Breast cancer in her mother and sister; Cancer in her brother; Coronary artery disease in an other family member; Diabetes in her mother; Heart attack in her brother and brother; Heart disease in her brother, brother, and mother; Hypertension in an other family member. Allergies  Allergen Reactions   Simvastatin Rash    Review of Systems  Constitutional:  Negative for malaise/fatigue.  Eyes:  Negative for blurred vision.  Respiratory:  Negative for shortness of breath.   Cardiovascular:  Negative for chest pain.   Neurological:  Negative for dizziness, weakness and headaches.      Objective:     BP 122/78 (BP Location: Left Arm, Cuff Size: Normal)   Pulse (!) 59   Temp 98.1 F (36.7 C) (Oral)   Ht 5\' 5"  (1.651 m)   Wt 187 lb 12.8 oz (85.2 kg)   SpO2 95%   BMI 31.25 kg/m  BP Readings from Last 3 Encounters:  12/11/22 122/78  09/04/22 102/60  08/07/22 110/70   Wt Readings from Last 3 Encounters:  12/11/22 187 lb 12.8 oz (85.2 kg)  09/04/22 191 lb 12.8 oz (87 kg)  08/07/22 198 lb 8 oz (90 kg)      Physical Exam Vitals reviewed.  Constitutional:      Appearance: Normal appearance.  Cardiovascular:     Rate and Rhythm: Normal rate and regular rhythm.  Pulmonary:     Effort: Pulmonary effort is normal.     Breath sounds: Normal breath sounds.  Musculoskeletal:     Comments: She does have some mild pitting edema right ankle greater than left.  Very minimal edema in her lower legs.  Neurological:     Mental Status: She is alert.      Results for orders placed or performed in visit on 12/11/22  POC HgB A1c  Result Value Ref Range   Hemoglobin A1C 6.4 (A) 4.0 - 5.6 %   HbA1c POC (<> result, manual entry)     HbA1c, POC (prediabetic range)     HbA1c, POC (controlled diabetic range)      Last CBC Lab Results  Component Value Date   WBC 8.5 09/04/2022   HGB 14.6 09/04/2022   HCT 45.4 09/04/2022   MCV 93 09/04/2022   MCH 29.8 09/04/2022   RDW 13.4 09/04/2022   PLT 276 09/04/2022   Last metabolic panel Lab Results  Component Value Date   GLUCOSE 123 (H) 09/04/2022   NA 140 09/04/2022   K 4.3 09/04/2022   CL 100 09/04/2022   CO2 27 09/04/2022   BUN 19 09/04/2022   CREATININE 0.77 09/04/2022   EGFR 73 09/04/2022   CALCIUM 9.7 09/04/2022   PROT 7.3 01/02/2022   ALBUMIN 3.7 01/02/2022   LABGLOB 2.7 10/09/2018   AGRATIO 1.6 10/09/2018   BILITOT 1.2 01/02/2022   ALKPHOS 76 01/02/2022   AST 17 01/02/2022   ALT 12 01/02/2022   ANIONGAP 11 12/27/2018   Last  lipids Lab Results  Component Value Date   CHOL 143 01/02/2022   HDL 38.40 (L) 01/02/2022   LDLCALC 80 01/02/2022   TRIG 121.0 01/02/2022   CHOLHDL 4 01/02/2022   Last hemoglobin A1c Lab Results  Component Value Date   HGBA1C 6.4 (A) 12/11/2022      The ASCVD Risk score (Arnett DK, et al., 2019) failed to calculate for the following reasons:   The 2019 ASCVD risk score is only valid for ages 26 to 14  The patient has a prior MI or stroke diagnosis    Assessment & Plan:   #1 type 2 diabetes improved with A1c 6.4% with her intentional dietary changes and weight loss.  Continue to monitor and will recheck in 6 months.  No additional medications indicated at this time.  #2 hypertension stable.  Initial reading was up today but improved after rest.  Continue ramipril and metoprolol  #3 history of A-fib/flutter.  Patient on Eliquis and metoprolol.  Still in A-fib clinically.  Rate stable.  #4 history of systolic heart failure.  Has to some trace edema in her ankles but has actually lost some weight as above.  Follow-up for any dyspnea or rapid weight gain.  She is on furosemide and had recent electrolytes which were stable   Return in about 6 months (around 06/13/2023).    [name], MD

## 2023-02-19 ENCOUNTER — Other Ambulatory Visit (HOSPITAL_COMMUNITY): Payer: Self-pay

## 2023-02-19 ENCOUNTER — Telehealth: Payer: Self-pay | Admitting: Student

## 2023-02-19 NOTE — Telephone Encounter (Signed)
Pt c/o medication issue:  1. Name of Medication:   apixaban (ELIQUIS) 5 MG TABS tablet   2. How are you currently taking this medication (dosage and times per day)?   As prescribed  3. Are you having a reaction (difficulty breathing--STAT)?   No  4. What is your medication issue?   Caller [name]) with patient present stated patient only has 4 tablets left and is in the donut hole and will need to get assistance getting this medication.  Caller stated she gets her medication from [facility] [employer], [place] - [address] [place].  Caller stated can also call patient directly or her at [phone] .

## 2023-02-20 ENCOUNTER — Other Ambulatory Visit (HOSPITAL_COMMUNITY): Payer: Self-pay

## 2023-02-20 ENCOUNTER — Telehealth: Payer: Self-pay

## 2023-02-20 NOTE — Telephone Encounter (Signed)
RECEIVED REQUEST TO INITIATE ELIQUIS APP. NEED ANNUAL GROSS INCOME FOR HH AND OUT OF POCKET EXPENSE REPORTS FROM PHARMACY FOR 2024. [facility] MESSAGE SENT REQUESTING INFO

## 2023-02-25 ENCOUNTER — Other Ambulatory Visit: Payer: Self-pay

## 2023-02-25 DIAGNOSIS — I4821 Permanent atrial fibrillation: Secondary | ICD-10-CM

## 2023-02-25 MED ORDER — APIXABAN 5 MG PO TABS
5.0000 mg | ORAL_TABLET | Freq: Two times a day (BID) | ORAL | 1 refills | Status: DC
Start: 2023-02-25 — End: 2023-11-05

## 2023-02-25 NOTE — Telephone Encounter (Signed)
Prescription refill request for Eliquis received. Indication:afib Last office visit:5/24 Scr:0.77  5/24 Age: 89 Weight:85.2  kg  Prescription refilled

## 2023-02-26 ENCOUNTER — Telehealth: Payer: Self-pay | Admitting: Family Medicine

## 2023-02-26 MED ORDER — SERTRALINE HCL 25 MG PO TABS: 25.0000 mg | ORAL_TABLET | Freq: Every day | ORAL | 1 refills | Status: AC

## 2023-02-26 NOTE — Telephone Encounter (Signed)
Prescription Request  02/26/2023  LOV: 12/11/2022  What is the name of the medication or equipment?  sertraline (ZOLOFT) 25 MG tablet  Have you contacted your pharmacy to request a refill? Yes   [facility] [employer] called to request this refill, on behalf of the patient.   Which pharmacy would you like this sent to?   [facility] [employer] [place], [place] -  [address] [place] [address] Phone: [phone] Fax: [phone]    Patient notified that their request is being sent to the clinical staff for review and that they should receive a response within 2 business days.    Phone  [phone] (H)

## 2023-02-26 NOTE — Telephone Encounter (Signed)
Rx sent 

## 2023-03-11 NOTE — Telephone Encounter (Signed)
PAP: PAP application for ELIQUIS, [employer] ([employer])) has been mailed to pt's home address on file. Will fax provider portion of application to provider's office when pt's portion is received.

## 2023-06-11 ENCOUNTER — Ambulatory Visit: Payer: PPO | Admitting: Family Medicine

## 2023-07-09 ENCOUNTER — Ambulatory Visit: Payer: PPO | Admitting: Family Medicine

## 2023-07-29 ENCOUNTER — Telehealth: Payer: Self-pay

## 2023-07-29 DIAGNOSIS — Z9181 History of falling: Secondary | ICD-10-CM

## 2023-07-29 NOTE — Telephone Encounter (Signed)
 Orders placed and mailed to patient residence and faxed to [facility]

## 2023-07-29 NOTE — Telephone Encounter (Signed)
 Copied from CRM [id number]. Topic: Clinical - Prescription Issue >> Jul 29, 2023 11:17 AM Drema Balzarine wrote: Reason for CRM: Patient daughter Hali Marry request a call back at [phone] regarding patient getting a new script for a lift chair because its broken

## 2023-07-29 NOTE — Addendum Note (Signed)
 Addended by: Christy Sartorius on: 07/29/2023 01:16 PM   Modules accepted: Orders

## 2023-07-30 ENCOUNTER — Ambulatory Visit (INDEPENDENT_AMBULATORY_CARE_PROVIDER_SITE_OTHER): Admitting: Family Medicine

## 2023-07-30 ENCOUNTER — Encounter: Payer: Self-pay | Admitting: Family Medicine

## 2023-07-30 VITALS — BP 118/64 | HR 82 | Temp 97.7°F | Ht 65.0 in | Wt 180.4 lb

## 2023-07-30 DIAGNOSIS — E119 Type 2 diabetes mellitus without complications: Secondary | ICD-10-CM | POA: Diagnosis not present

## 2023-07-30 DIAGNOSIS — R739 Hyperglycemia, unspecified: Secondary | ICD-10-CM

## 2023-07-30 DIAGNOSIS — I119 Hypertensive heart disease without heart failure: Secondary | ICD-10-CM

## 2023-07-30 DIAGNOSIS — I480 Paroxysmal atrial fibrillation: Secondary | ICD-10-CM | POA: Diagnosis not present

## 2023-07-30 DIAGNOSIS — E782 Mixed hyperlipidemia: Secondary | ICD-10-CM

## 2023-07-30 LAB — COMPREHENSIVE METABOLIC PANEL WITH GFR
ALT: 10 U/L (ref 0–35)
AST: 15 U/L (ref 0–37)
Albumin: 3.9 g/dL (ref 3.5–5.2)
Alkaline Phosphatase: 78 U/L (ref 39–117)
BUN: 22 mg/dL (ref 6–23)
CO2: 33 meq/L — ABNORMAL HIGH (ref 19–32)
Calcium: 9.3 mg/dL (ref 8.4–10.5)
Chloride: 102 meq/L (ref 96–112)
Creatinine, Ser: 0.67 mg/dL (ref 0.40–1.20)
GFR: 76.17 mL/min (ref >60.00)
Glucose, Bld: 101 mg/dL — ABNORMAL HIGH (ref 70–99)
Potassium: 4.3 meq/L (ref 3.5–5.1)
Sodium: 139 meq/L (ref 135–145)
Total Bilirubin: 1 mg/dL (ref 0.2–1.2)
Total Protein: 7 g/dL (ref 6.0–8.3)

## 2023-07-30 LAB — POCT GLYCOSYLATED HEMOGLOBIN (HGB A1C): Hemoglobin A1C: 6.7 % — AB (ref 4.0–5.6)

## 2023-07-30 LAB — LIPID PANEL
Cholesterol: 132 mg/dL (ref 0–200)
HDL: 37.5 mg/dL — ABNORMAL LOW (ref >39.00)
LDL Cholesterol: 70 mg/dL (ref 0–99)
NonHDL: 94.96
Total CHOL/HDL Ratio: 4
Triglycerides: 123 mg/dL (ref 0.0–149.0)
VLDL: 24.6 mg/dL (ref 0.0–40.0)

## 2023-07-30 NOTE — Progress Notes (Signed)
 Established Patient Office Visit  Subjective   Patient ID: [name], female    DOB: December 28, 1929  Age: 89 y.o. MRN: [id number]  Chief Complaint  Patient presents with   Follow-up    6 month follow-up, A1c recheck     HPI   [name] is seen today for medical follow-up.  She has history of intermittent atrial fibrillation, chronic systolic heart failure, retention, type 2 diabetes, hyperlipidemia.  She has been intentionally losing some weight and is down her 7 pounds from last visit.  She has diabetes which has been controlled without medication.  Her last A1c was 6.4%.  Has not made any major dietary changes since last visit.  She currently takes sertraline 25 mg daily, Altace 10 mg 2 tablets daily, metoprolol 100 mg 1/2 tablet twice daily, furosemide 40 mg daily, atorvastatin 10 mg 1/2 tablet daily and Eliquis 5 mg twice daily.  Has not had recent lipid panel.  Tolerating low-dose atorvastatin.  Not monitoring blood pressures recently at home.  Generally fairly sedentary.  She has some chronic lower extremity edema and uses compression stockings bilaterally.  Had requested prescription yesterday for new lift chair.  Denies recent fall.  Past Medical History:  Diagnosis Date   Anemia    CAD 09/24/2008   AMI in 1998 tx with PCI to LAD;  myoview 1/12:  no ischemia, EF 45%   CARDIOMYOPATHY 09/24/2008   ischemic;  echo 4/12:  EF 20-25%, mild MR, mod LAE, mod reduced RVSF, mod RVE, mild RAE, mild TR, PASP 40   CAROTID BRUIT 09/24/2008   DIABETES MELLITUS 09/24/2008   Edema 09/24/2008   GERD (gastroesophageal reflux disease)    HYPERLIPIDEMIA 09/24/2008   HYPERTENSION 09/24/2008   MI 09/24/2008   Osteopenia    Persistent atrial fibrillation (HCC) 09/24/2008   s/p DCCV x 4 in past;  Amiod d/c'd 2/2 abnormal PFTs;  Tikosyn load 6/12 with DCCV   Tachycardia-bradycardia [facility])    Past Surgical History:  Procedure Laterality Date   CARDIOVERSION N/A 02/03/2015   Procedure: CARDIOVERSION;  Surgeon:  Lars Masson, MD;  Location: [facility] ENDOSCOPY;  Service: Cardiovascular;  Laterality: N/A;   CARDIOVERSION N/A 07/08/2018   Procedure: CARDIOVERSION;  Surgeon: Vesta Mixer, MD;  Location: [facility] ENDOSCOPY;  Service: Cardiovascular;  Laterality: N/A;  getting INR prior   ELECTROPHYSIOLOGIC STUDY N/A 10/15/2014   PVI and CTI ablation by Dr Johney Frame   HIP FRACTURE SURGERY  2006   KNEE SURGERY  2008   broken knee   LOOP RECORDER IMPLANT N/A 11/06/2012   Procedure: LOOP RECORDER IMPLANT;  Surgeon: Hillis Range, MD;  Location: [facility] CATH LAB;  Service: Cardiovascular;  Laterality: N/A;Medtronic LinQ implanted by Dr Johney Frame for palpitaitons and dizziness   TEE WITHOUT CARDIOVERSION N/A 10/15/2014   Procedure: TRANSESOPHAGEAL ECHOCARDIOGRAM (TEE);  Surgeon: Vesta Mixer, MD;  Location: [facility] ENDOSCOPY;  Service: Cardiovascular;  Laterality: N/A;    reports that she quit smoking about 27 years ago. Her smoking use included cigarettes. She started smoking about 47 years ago. She has a 10 pack-year smoking history. She has been exposed to tobacco smoke. She has never used smokeless tobacco. She reports that she does not drink alcohol and does not use drugs. family history includes Alcohol abuse in her brother and father; Brain cancer in her sister; Breast cancer in her mother and sister; Cancer in her brother; Coronary artery disease in an other family member; Diabetes in her mother; Heart attack in her brother and  brother; Heart disease in her brother, brother, and mother; Hypertension in an other family member. Allergies  Allergen Reactions   Simvastatin Rash    Review of Systems  Constitutional:  Negative for chills, fever and malaise/fatigue.  Eyes:  Negative for blurred vision.  Respiratory:  Negative for shortness of breath.   Cardiovascular:  Negative for chest pain.  Neurological:  Negative for dizziness, weakness and headaches.      Objective:     BP 118/64 (BP Location: Left Arm, Cuff Size:  Normal)   Pulse 82   Temp 97.7 F (36.5 C) (Oral)   Ht 5\' 5"  (1.651 m)   Wt 180 lb 6.4 oz (81.8 kg)   SpO2 97%   BMI 30.02 kg/m  BP Readings from Last 3 Encounters:  07/30/23 118/64  12/11/22 122/78  09/04/22 102/60   Wt Readings from Last 3 Encounters:  07/30/23 180 lb 6.4 oz (81.8 kg)  12/11/22 187 lb 12.8 oz (85.2 kg)  09/04/22 191 lb 12.8 oz (87 kg)      Physical Exam Vitals reviewed.  Constitutional:      General: She is not in acute distress.    Appearance: She is well-developed. She is not ill-appearing.  Eyes:     Pupils: Pupils are equal, round, and reactive to light.  Neck:     Thyroid: No thyromegaly.     Vascular: No JVD.  Cardiovascular:     Rate and Rhythm: Normal rate and regular rhythm.     Heart sounds:     No gallop.  Pulmonary:     Effort: Pulmonary effort is normal. No respiratory distress.     Breath sounds: Normal breath sounds. No wheezing or rales.  Musculoskeletal:     Cervical back: Neck supple.     Comments: Compressing stockings on bilaterally.  Neurological:     Mental Status: She is alert.      Results for orders placed or performed in visit on 07/30/23  POC HgB A1c  Result Value Ref Range   Hemoglobin A1C 6.7 (A) 4.0 - 5.6 %   HbA1c POC (<> result, manual entry)     HbA1c, POC (prediabetic range)     HbA1c, POC (controlled diabetic range)      Last CBC Lab Results  Component Value Date   WBC 8.5 09/04/2022   HGB 14.6 09/04/2022   HCT 45.4 09/04/2022   MCV 93 09/04/2022   MCH 29.8 09/04/2022   RDW 13.4 09/04/2022   PLT 276 09/04/2022   Last metabolic panel Lab Results  Component Value Date   GLUCOSE 123 (H) 09/04/2022   NA 140 09/04/2022   K 4.3 09/04/2022   CL 100 09/04/2022   CO2 27 09/04/2022   BUN 19 09/04/2022   CREATININE 0.77 09/04/2022   EGFR 73 09/04/2022   CALCIUM 9.7 09/04/2022   PROT 7.3 01/02/2022   ALBUMIN 3.7 01/02/2022   LABGLOB 2.7 10/09/2018   AGRATIO 1.6 10/09/2018   BILITOT 1.2  01/02/2022   ALKPHOS 76 01/02/2022   AST 17 01/02/2022   ALT 12 01/02/2022   ANIONGAP 11 12/27/2018   Last lipids Lab Results  Component Value Date   CHOL 143 01/02/2022   HDL 38.40 (L) 01/02/2022   LDLCALC 80 01/02/2022   TRIG 121.0 01/02/2022   CHOLHDL 4 01/02/2022   Last hemoglobin A1c Lab Results  Component Value Date   HGBA1C 6.7 (A) 07/30/2023      The ASCVD Risk score (Arnett DK, et al., 2019)  failed to calculate for the following reasons:   The 2019 ASCVD risk score is only valid for ages 5 to 10   Risk score cannot be calculated because patient has a medical history suggesting prior/existing ASCVD    Assessment & Plan:   Problem List Items Addressed This Visit       Unprioritized   Hyperlipidemia   Relevant Orders   Lipid panel   CMP   Hypertensive cardiovascular disease   Other Visit Diagnoses       Controlled type 2 diabetes mellitus without complication, without long-term current use of insulin (HCC)    -  Primary   Relevant Orders   POC HgB A1c (Completed)     Hyperglycemia         A1c today 6.7% which is up just slightly from 6.4% 6 months ago.  Continue lower glycemic diet and reassess in 6 months  Blood pressure initially slightly up but then improved significantly after rest.  Continue current regimen as above  Continue metoprolol and Eliquis for her history of A-fib.  Appears to be in sinus rhythm today.  Return in about 6 months (around 01/29/2024).    [name], MD

## 2023-08-07 ENCOUNTER — Telehealth: Payer: Self-pay | Admitting: Student

## 2023-08-07 NOTE — Telephone Encounter (Signed)
*  STAT* If patient is at the pharmacy, call can be transferred to refill team.   1. Which medications need to be refilled? (please list name of each medication and dose if known) apixaban (ELIQUIS) 5 MG TABS tablet   2. Which pharmacy/location (including street and city if local pharmacy) is medication to be sent to? [facility] [address] [place] - [address] Phone: [phone]  Fax: [phone]   3. Do they need a 30 day or 90 day supply? 90

## 2023-08-12 ENCOUNTER — Telehealth: Payer: Self-pay

## 2023-08-12 NOTE — Telephone Encounter (Signed)
 Unsuccessful attempt to reach patient on preferred number listed in notes for scheduled AWV Left message with Granddaughter okay to reschedule.

## 2023-08-12 NOTE — Progress Notes (Unsigned)
 Subjective:   [name] is a 89 y.o. who presents for a Medicare Wellness preventive visit.  Visit Complete: {VISITMETHODVS:[id number]}  {AWVVIDEO:32072}  Persons Participating in Visit: {Persons Participating in Visit:32444}  AWV Questionnaire: {AWVQuestionnaire:32338}        Objective:    There were no vitals filed for this visit. There is no height or weight on file to calculate BMI.     08/06/2022    3:54 PM 08/02/2021   11:48 AM 03/23/2021    7:39 AM 10/03/2020    3:42 PM 07/27/2020   10:24 AM 12/27/2018    1:53 PM 07/08/2018   10:46 AM  Advanced Directives  Does Patient Have a Medical Advance Directive? Yes Yes No No Yes No No  Type of Estate agent of [place];Living will Living will;Healthcare Power of [employer] Power of Attorney    Does patient want to make changes to medical advance directive?  No - Patient declined       Copy of Healthcare Power of Attorney in Chart? No - copy requested    No - copy requested    Would patient like information on creating a medical advance directive?  No - Patient declined  No - Patient declined  No - Patient declined No - Patient declined    Current Medications (verified) Outpatient Encounter Medications as of 08/12/2023  Medication Sig   acetaminophen  (TYLENOL ) 325 MG tablet Take 325 mg by mouth 2 (two) times daily as needed for moderate pain or headache.   ammonium lactate (LAC-HYDRIN) 12 % lotion 1 application   apixaban  (ELIQUIS ) 5 MG TABS tablet Take 1 tablet (5 mg total) by mouth 2 (two) times daily.   atorvastatin  (LIPITOR) 10 MG tablet Take 0.5 tablets (5 mg total) by mouth daily after supper.   Blood Glucose Monitoring Suppl (ONE TOUCH ULTRA 2) w/Device KIT Use as directed.   Calcium  Carb-Cholecalciferol (CALCIUM  600+D3 PO) Take 1 tablet by mouth daily.   furosemide  (LASIX ) 40 MG tablet Take 1 tablet (40 mg total) by mouth daily.   glucose blood test strip 1 each by Other route as needed  for other. Use as instructed with One Touch glucometer.   Magnesium  Oxide 400 MG CAPS Take 1 capsule (400 mg total) by mouth daily.   metoprolol  tartrate (LOPRESSOR ) 100 MG tablet Take 0.5 tablets (50 mg total) by mouth 2 (two) times daily.   mometasone  (ELOCON ) 0.1 % lotion Apply topically daily.   Multiple Vitamin (MULTIVITAMIN WITH MINERALS) TABS tablet Take 1 tablet by mouth daily. One-A-Day   ramipril  (ALTACE ) 10 MG capsule Take 2 capsules (20 mg total) by mouth daily.   sertraline  (ZOLOFT ) 25 MG tablet Take 1 tablet (25 mg total) by mouth daily.   vitamin B-12 (CYANOCOBALAMIN) 500 MCG tablet Take 500 mcg by mouth daily.   No facility-administered encounter medications on file as of 08/12/2023.    Allergies (verified) Simvastatin   History: Past Medical History:  Diagnosis Date   Anemia    CAD 09/24/2008   AMI in 1998 tx with PCI to LAD;  myoview  1/12:  no ischemia, EF 45%   CARDIOMYOPATHY 09/24/2008   ischemic;  echo 4/12:  EF 20-25%, mild MR, mod LAE, mod reduced RVSF, mod RVE, mild RAE, mild TR, PASP 40   CAROTID BRUIT 09/24/2008   DIABETES MELLITUS 09/24/2008   Edema 09/24/2008   GERD (gastroesophageal reflux disease)    HYPERLIPIDEMIA 09/24/2008   HYPERTENSION 09/24/2008   MI 09/24/2008  Osteopenia    Persistent atrial fibrillation (HCC) 09/24/2008   s/p DCCV x 4 in past;  Amiod d/c'd 2/2 abnormal PFTs;  Tikosyn  load 6/12 with DCCV   Tachycardia-bradycardia [facility])    Past Surgical History:  Procedure Laterality Date   CARDIOVERSION N/A 02/03/2015   Procedure: CARDIOVERSION;  Surgeon: Liza Riggers, MD;  Location: [facility] ENDOSCOPY;  Service: Cardiovascular;  Laterality: N/A;   CARDIOVERSION N/A 07/08/2018   Procedure: CARDIOVERSION;  Surgeon: Lake Pilgrim, MD;  Location: [facility] ENDOSCOPY;  Service: Cardiovascular;  Laterality: N/A;  getting INR prior   ELECTROPHYSIOLOGIC STUDY N/A 10/15/2014   PVI and CTI ablation by Dr Nunzio Belch   HIP FRACTURE SURGERY  2006   KNEE SURGERY  2008    broken knee   LOOP RECORDER IMPLANT N/A 11/06/2012   Procedure: LOOP RECORDER IMPLANT;  Surgeon: Jolly Needle, MD;  Location: [facility] CATH LAB;  Service: Cardiovascular;  Laterality: N/A;Medtronic LinQ implanted by Dr Nunzio Belch for palpitaitons and dizziness   TEE WITHOUT CARDIOVERSION N/A 10/15/2014   Procedure: TRANSESOPHAGEAL ECHOCARDIOGRAM (TEE);  Surgeon: Lake Pilgrim, MD;  Location: [facility] ENDOSCOPY;  Service: Cardiovascular;  Laterality: N/A;   Family History  Problem Relation Age of Onset   Breast cancer Mother    Heart disease Mother    Diabetes Mother    Alcohol abuse Father    Breast cancer Sister    Brain cancer Sister        cause of death at 60 years old   Alcohol abuse Brother    Heart disease Brother    Heart disease Brother    Cancer Brother        unknown type   Coronary artery disease Other    Hypertension Other    Heart attack Brother    Heart attack Brother    Social History   Socioeconomic History   Marital status: Widowed    Spouse name: Not on file   Number of children: Not on file   Years of education: Not on file   Highest education level: Not on file  Occupational History   Not on file  Tobacco Use   Smoking status: Former    Current packs/day: 0.00    Average packs/day: 0.5 packs/day for 20.0 years (10.0 ttl pk-yrs)    Types: Cigarettes    Start date: 07/11/1976    Quit date: 07/11/1996    Years since quitting: 27.1    Passive exposure: Past   Smokeless tobacco: Never  Vaping Use   Vaping status: Not on file  Substance and Sexual Activity   Alcohol use: No   Drug use: No   Sexual activity: Not Currently  Other Topics Concern   Not on file  Social History Narrative   Not on file   Social Drivers of Health   Financial Resource Strain: Low Risk  (08/06/2022)   Overall Financial Resource Strain (CARDIA)    Difficulty of Paying Living Expenses: Not hard at all  Food Insecurity: No Food Insecurity (09/12/2022)   Hunger Vital Sign    Worried About  Running Out of Food in the Last Year: Never true    Ran Out of Food in the Last Year: Never true  Transportation Needs: No Transportation Needs (08/06/2022)   PRAPARE - Administrator, Civil Service (Medical): No    Lack of Transportation (Non-Medical): No  Physical Activity: Inactive (08/06/2022)   Exercise Vital Sign    Days of Exercise per Week: 0 days  Minutes of Exercise per Session: 0 min  Stress: No Stress Concern Present (08/06/2022)   [employer] of Occupational Health - Occupational Stress Questionnaire    Feeling of Stress : Not at all  Social Connections: Moderately Integrated (08/06/2022)   Social Connection and Isolation Panel [NHANES]    Frequency of Communication with Friends and Family: More than three times a week    Frequency of Social Gatherings with Friends and Family: More than three times a week    Attends Religious Services: More than 4 times per year    Active Member of [employer] or Organizations: Yes    Attends Banker Meetings: More than 4 times per year    Marital Status: Widowed    Tobacco Counseling Counseling given: Not Answered    Clinical Intake:              Lab Results  Component Value Date   HGBA1C 6.7 (A) 07/30/2023   HGBA1C 6.4 (A) 12/11/2022   HGBA1C 7.2 (H) 08/07/2022               Activities of Daily Living ***     No data to display          Patient Care Team: Marquetta Sit, MD as PCP - General (Family Medicine) Jolly Needle, MD (Inactive) as PCP - Electrophysiology (Cardiology) Carnell Christian, [facility] (Pharmacist)  Indicate any recent Medical Services you may have received from other than [facility] providers in the past year (date may be approximate).     Assessment:   This is a routine wellness examination for Keneshia.  Hearing/Vision screen No results found.   Goals Addressed   None    Depression Screen ***    08/07/2022   10:15 AM 08/06/2022    3:52 PM 08/02/2021   11:42  AM 07/27/2020   10:22 AM 03/13/2018   11:45 AM 10/29/2014    9:53 AM  PHQ 2/9 Scores  PHQ - 2 Score 0 0 0 0 0 0  PHQ- 9 Score 1         Fall Risk ***    08/07/2022   10:14 AM 08/06/2022    3:53 PM 08/02/2021   11:47 AM 07/27/2020   10:25 AM 03/13/2019    2:14 PM  Fall Risk   Falls in the past year? 0 0 0 0 0  Comment     Emmi Telephone Survey: data to providers prior to load  Number falls in past yr: 0 0 0 0   Injury with Fall? 0 0 0 0   Risk for fall due to : No Fall Risks No Fall Risks No Fall Risks Impaired vision;Impaired mobility;Impaired balance/gait   Follow up Falls evaluation completed Falls prevention discussed  Falls prevention discussed     MEDICARE RISK AT HOME: ***    TIMED UP AND GO:  Was the test performed?  {AMBTIMEDUPGO:[id number]}  Cognitive Function: {CognitiveScreening:32337}        08/06/2022    3:54 PM 08/02/2021   11:50 AM 07/27/2020   10:30 AM  6CIT Screen  What Year? 0 points 0 points 0 points  What month? 0 points 0 points 0 points  What time? 0 points 0 points   Count back from 20 0 points 0 points 0 points  Months in reverse 0 points 0 points 0 points  Repeat phrase 0 points 0 points 2 points  Total Score 0 points 0 points     Immunizations Immunization History  Administered Date(s) Administered   Fluad Quad(high Dose 65+) 01/05/2019, 01/03/2021, 01/02/2022   Influenza Split 02/20/2012   Influenza, High Dose Seasonal PF 03/03/2015, 02/21/2018   Influenza,inj,Quad PF,6+ Mos 03/13/2013, 02/08/2014, 02/13/2016, 02/06/2017   Pneumococcal Conjugate-13 04/23/1998, 02/22/2014    Screening Tests Health Maintenance  Topic Date Due   FOOT EXAM  Never done   OPHTHALMOLOGY EXAM  Never done   DTaP/Tdap/Td (1 - Tdap) Never done   Zoster Vaccines- Shingrix (1 of 2) Never done   DEXA SCAN  Never done   Pneumonia Vaccine 69+ Years old (2 of 2 - PPSV23) 04/19/2014   COVID-19 Vaccine (1 - 2024-25 season) Never done   Medicare Annual Wellness (AWV)   08/06/2023   INFLUENZA VACCINE  11/22/2023   HEMOGLOBIN A1C  01/29/2024   HPV VACCINES  Aged Out   Meningococcal B Vaccine  Aged Out    Health Maintenance  Health Maintenance Due  Topic Date Due   FOOT EXAM  Never done   OPHTHALMOLOGY EXAM  Never done   DTaP/Tdap/Td (1 - Tdap) Never done   Zoster Vaccines- Shingrix (1 of 2) Never done   DEXA SCAN  Never done   Pneumonia Vaccine 85+ Years old (2 of 2 - PPSV23) 04/19/2014   COVID-19 Vaccine (1 - 2024-25 season) Never done   Medicare Annual Wellness (AWV)  08/06/2023   Health Maintenance Items Addressed: {HMMCR (Optional):30011}  Additional Screening:  Vision Screening: Recommended annual ophthalmology exams for early detection of glaucoma and other disorders of the eye.  Dental Screening: Recommended annual dental exams for proper oral hygiene  Community Resource Referral / Chronic Care Management: CRR required this visit?  {YES/NO:21197}  CCM required this visit?  {CCM Required choices:[phone]}     Plan:     I have personally reviewed and noted the following in the patient's chart:   Medical and social history Use of alcohol, tobacco or illicit drugs  Current medications and supplements including opioid prescriptions. {Opioid Prescriptions:[phone]} Functional ability and status Nutritional status Physical activity Advanced directives List of other physicians Hospitalizations, surgeries, and ER visits in previous 12 months Vitals Screenings to include cognitive, depression, and falls Referrals and appointments  In addition, I have reviewed and discussed with patient certain preventive protocols, quality metrics, and best practice recommendations. A written personalized care plan for preventive services as well as general preventive health recommendations were provided to patient.     Dewayne Ford, LPN   1/61/0960   After Visit Summary: {CHL AMB AWV After Visit Summary:[phone]}  Notes: {Nurse  Notes:32343}

## 2023-08-12 NOTE — Telephone Encounter (Signed)
 Copied from CRM [id number]. Topic: General - Other >> Aug 12, 2023  3:53 PM Caliyah H wrote: Reason for CRM: Patient is returning a callback for [facility] regarding their wellness visit, which is scheduled for today over the phone.  Callback Number: [phone]

## 2023-08-12 NOTE — Telephone Encounter (Signed)
 Error- please disregard

## 2023-09-03 ENCOUNTER — Telehealth: Payer: Self-pay | Admitting: Cardiology

## 2023-09-03 DIAGNOSIS — E1159 Type 2 diabetes mellitus with other circulatory complications: Secondary | ICD-10-CM

## 2023-09-03 MED ORDER — RAMIPRIL 10 MG PO CAPS: 20.0000 mg | ORAL_CAPSULE | Freq: Every day | ORAL | 0 refills | Status: DC

## 2023-09-03 MED ORDER — FUROSEMIDE 40 MG PO TABS: 40.0000 mg | ORAL_TABLET | Freq: Every day | ORAL | 0 refills | Status: DC

## 2023-09-03 MED ORDER — ATORVASTATIN CALCIUM 10 MG PO TABS: 5.0000 mg | ORAL_TABLET | Freq: Every day | ORAL | 0 refills | Status: DC

## 2023-09-03 MED ORDER — METOPROLOL TARTRATE 100 MG PO TABS: 50.0000 mg | ORAL_TABLET | Freq: Two times a day (BID) | ORAL | 0 refills | Status: DC

## 2023-09-03 NOTE — Telephone Encounter (Signed)
*  STAT* If patient is at the pharmacy, call can be transferred to refill team.   1. Which medications need to be refilled? (please list name of each medication and dose if known)   metoprolol  tartrate (LOPRESSOR ) 100 MG tablet  atorvastatin  (LIPITOR) 10 MG tablet  furosemide  (LASIX ) 40 MG tablet  ramipril  (ALTACE ) 10 MG capsule   2. Would you like to learn more about the convenience, safety, & potential cost savings by using the [facility]?   3. Are you open to using the [facility] (Type [facility]. ).  4. Which pharmacy/location (including street and city if local pharmacy) is medication to be sent to?  [facility] [address] [place] - [address]   5. Do they need a 30 day or 90 day supply?  90 day  Caller Debria Fang) stated patient still has medication left.

## 2023-09-03 NOTE — Telephone Encounter (Signed)
 Pt's medications were sent to pt's pharmacy as requested. Confirmation received.

## 2023-11-05 ENCOUNTER — Ambulatory Visit: Attending: Student | Admitting: Student

## 2023-11-05 ENCOUNTER — Encounter: Payer: Self-pay | Admitting: Student

## 2023-11-05 VITALS — BP 118/62 | HR 81 | Ht 65.0 in | Wt 171.5 lb

## 2023-11-05 DIAGNOSIS — I152 Hypertension secondary to endocrine disorders: Secondary | ICD-10-CM

## 2023-11-05 DIAGNOSIS — I5022 Chronic systolic (congestive) heart failure: Secondary | ICD-10-CM | POA: Diagnosis not present

## 2023-11-05 DIAGNOSIS — I4821 Permanent atrial fibrillation: Secondary | ICD-10-CM | POA: Diagnosis not present

## 2023-11-05 DIAGNOSIS — I10 Essential (primary) hypertension: Secondary | ICD-10-CM

## 2023-11-05 DIAGNOSIS — I4811 Longstanding persistent atrial fibrillation: Secondary | ICD-10-CM

## 2023-11-05 DIAGNOSIS — E1159 Type 2 diabetes mellitus with other circulatory complications: Secondary | ICD-10-CM | POA: Diagnosis not present

## 2023-11-05 MED ORDER — RAMIPRIL 10 MG PO CAPS: 20.0000 mg | ORAL_CAPSULE | Freq: Every day | ORAL | 3 refills | Status: AC

## 2023-11-05 MED ORDER — APIXABAN 5 MG PO TABS: 5.0000 mg | ORAL_TABLET | Freq: Two times a day (BID) | ORAL | 1 refills | Status: DC

## 2023-11-05 MED ORDER — METOPROLOL TARTRATE 100 MG PO TABS: 50.0000 mg | ORAL_TABLET | Freq: Two times a day (BID) | ORAL | 3 refills | Status: AC

## 2023-11-05 MED ORDER — FUROSEMIDE 40 MG PO TABS: 40.0000 mg | ORAL_TABLET | Freq: Every day | ORAL | 3 refills | Status: AC

## 2023-11-05 MED ORDER — ATORVASTATIN CALCIUM 10 MG PO TABS
5.0000 mg | ORAL_TABLET | Freq: Every day | ORAL | 3 refills | Status: AC
Start: 2023-11-05 — End: ?

## 2023-11-05 NOTE — Patient Instructions (Addendum)
 Medication Instructions:  Your physician recommends that you continue on your current medications as directed. Please refer to the Current Medication list given to you today.  *If you need a refill on your cardiac medications before your next appointment, please call your pharmacy*  Lab Work: CBC-TODAY If you have labs (blood work) drawn today and your tests are completely normal, you will receive your results only by: MyChart Message (if you have MyChart) OR A paper copy in the mail If you have any lab test that is abnormal or we need to change your treatment, we will call you to review the results.  Follow-Up: At [facility], you and your health needs are our priority.  As part of our continuing mission to provide you with exceptional heart care, our providers are all part of one team.  This team includes your primary Cardiologist (physician) and Advanced Practice Providers or APPs (Physician Assistants and Nurse Practitioners) who all work together to provide you with the care you need, when you need it.  Your next appointment:   1 year(s)  Provider:   You may see Will Gladis Norton, MD or one of the following Advanced Practice Providers on your designated Care Team:   Charlies Arthur, PA-C Michael Andy Tillery, PA-C Suzann Riddle, NP Daphne Barrack, NP

## 2023-11-05 NOTE — Progress Notes (Signed)
  Electrophysiology Office Note:   Date:  11/05/2023  ID:  [name], DOB 30-Jan-1930, MRN [id number]  Primary Cardiologist: None Electrophysiologist: Will Gladis Norton, MD      History of Present Illness:   [name] is a 89 y.o. female with h/o CAD, DM2, Chronic systolic CHF, HLD, HTN, and longstanding persistent AF  seen today for routine electrophysiology followup.   Since last being seen in our clinic the patient reports doing great from a cardiac perspective. Overall, she denies chest pain, palpitations, dyspnea, PND, orthopnea, nausea, vomiting, dizziness, syncope, edema, weight gain, or early satiety.   Review of systems complete and found to be negative unless listed in HPI.   EP Information / Studies Reviewed:    EKG is ordered today. Personal review as below.  EKG Interpretation Date/Time:  Tuesday November 05 2023 11:07:02 EDT Ventricular Rate:  81 PR Interval:    QRS Duration:  136 QT Interval:  388 QTC Calculation: 450 R Axis:   -29  Text Interpretation: Atrial fibrillation Left ventricular hypertrophy with QRS widening and repolarization abnormality ( R in aVL , Cornell product ) When compared with ECG of 08-Jul-2018 13:43, Atrial fibrillation has replaced Junctional rhythm QRS duration has increased Confirmed by Lesia Sharper [phone]) on 11/05/2023 11:16:05 AM    Arrhythmia/Device History ILR-Medtronic-Carelink (RRT as of 03/15/16)   Physical Exam:   VS:  BP 118/62 (BP Location: Left Arm, Patient Position: Sitting, Cuff Size: Normal)   Pulse 81   Ht 5' 5 (1.651 m)   Wt 171 lb 8 oz (77.8 kg)   SpO2 96%   BMI 28.54 kg/m    Wt Readings from Last 3 Encounters:  11/05/23 171 lb 8 oz (77.8 kg)  07/30/23 180 lb 6.4 oz (81.8 kg)  12/11/22 187 lb 12.8 oz (85.2 kg)     GEN: No acute distress NECK: No JVD; No carotid bruits CARDIAC: Irregularly irregular rate and rhythm, no murmurs, rubs, gallops RESPIRATORY:  Clear to auscultation without rales, wheezing or  rhonchi  ABDOMEN: Soft, non-tender, non-distended EXTREMITIES:  Trace edema; No deformity   ASSESSMENT AND PLAN:    Permanent AF EKG today shows rate controlled AF Continue eliquis  5 mg BID for CHA2DS2VASc of at least 7 Continue lopressor  50 mg BID  Chronic systolic CHF Prior echo 20-25% (2016) Continue lasix  40 mg daily Continue ramipril  and lopresor No spiro with h/o labile K Defers any invasive work up.  No indication for ICD in her age group.  HTN Stable on current regimen   Chronic venous insuff Suspect component of lymphadema  DM2 Per PCP   Follow up with EP APP in 12 months  Signed, Sharper Prentice Lesia, PA-C

## 2023-11-06 ENCOUNTER — Ambulatory Visit: Payer: Self-pay | Admitting: Student

## 2023-11-06 LAB — CBC
Hematocrit: 43.1 % (ref 34.0–46.6)
Hemoglobin: 13.6 g/dL (ref 11.1–15.9)
MCH: 30 pg (ref 26.6–33.0)
MCHC: 31.6 g/dL (ref 31.5–35.7)
MCV: 95 fL (ref 79–97)
Platelets: 276 x10E3/uL (ref 150–450)
RBC: 4.53 x10E6/uL (ref 3.77–5.28)
RDW: 13.6 % (ref 11.7–15.4)
WBC: 8.8 x10E3/uL (ref 3.4–10.8)

## 2023-11-19 DIAGNOSIS — G8929 Other chronic pain: Secondary | ICD-10-CM | POA: Diagnosis not present

## 2023-11-19 DIAGNOSIS — I110 Hypertensive heart disease with heart failure: Secondary | ICD-10-CM | POA: Diagnosis not present

## 2023-11-19 DIAGNOSIS — E261 Secondary hyperaldosteronism: Secondary | ICD-10-CM | POA: Diagnosis not present

## 2023-11-19 DIAGNOSIS — I4891 Unspecified atrial fibrillation: Secondary | ICD-10-CM | POA: Diagnosis not present

## 2023-11-19 DIAGNOSIS — Z87891 Personal history of nicotine dependence: Secondary | ICD-10-CM | POA: Diagnosis not present

## 2023-11-19 DIAGNOSIS — I509 Heart failure, unspecified: Secondary | ICD-10-CM | POA: Diagnosis not present

## 2023-11-19 DIAGNOSIS — F419 Anxiety disorder, unspecified: Secondary | ICD-10-CM | POA: Diagnosis not present

## 2023-11-19 DIAGNOSIS — E1142 Type 2 diabetes mellitus with diabetic polyneuropathy: Secondary | ICD-10-CM | POA: Diagnosis not present

## 2023-11-19 DIAGNOSIS — D6869 Other thrombophilia: Secondary | ICD-10-CM | POA: Diagnosis not present

## 2023-11-19 DIAGNOSIS — E1169 Type 2 diabetes mellitus with other specified complication: Secondary | ICD-10-CM | POA: Diagnosis not present

## 2023-11-19 DIAGNOSIS — E663 Overweight: Secondary | ICD-10-CM | POA: Diagnosis not present

## 2023-11-19 DIAGNOSIS — E785 Hyperlipidemia, unspecified: Secondary | ICD-10-CM | POA: Diagnosis not present

## 2023-12-31 ENCOUNTER — Telehealth: Payer: Self-pay

## 2023-12-31 NOTE — Progress Notes (Signed)
   12/31/2023  Patient ID: [name], female   DOB: [birth year], 89 y.o.   MRN: [id number]  Pharmacy Quality Measure Review  This patient is appearing on a report for being at risk of failing the adherence measure for cholesterol (statin) medications this calendar year.   Medication: Atorvastatin  Last fill date: 09/03/23 for 90 day supply  Left voicemail for patient to return my call at their convenience.  Jon VEAR Lindau, PharmD Clinical Pharmacist [phone]

## 2024-01-03 ENCOUNTER — Telehealth: Payer: Self-pay

## 2024-01-03 NOTE — Progress Notes (Signed)
   01/03/2024  Patient ID: [name], female   DOB: 1929/11/05, 89 y.o.   MRN: [id number]  Pharmacy Quality Measure Review  This patient is appearing on a report for being at risk of failing the adherence measure for cholesterol (statin) medications this calendar year.   Medication: Atorvastatin  Last fill date: 09/03/23 for 90 day supply   Patient states she still has medication and confirms taking as prescribed. Prefers to ask pharmacy to refill when she is ready.  Jon VEAR Lindau, PharmD Clinical Pharmacist [phone]

## 2024-01-13 ENCOUNTER — Telehealth: Payer: Self-pay | Admitting: *Deleted

## 2024-01-13 DIAGNOSIS — Z9181 History of falling: Secondary | ICD-10-CM

## 2024-01-13 NOTE — Telephone Encounter (Signed)
 Copied from CRM [id number]. Topic: Clinical - Order For Equipment >> Jan 13, 2024 11:10 AM [place] E wrote: Reason for CRM: Patient's daughter, Graeme, called in stating that her mom's lift chair broke this morning and they are needing an order for patient to get a new chair. Callback number for daughter is [phone].

## 2024-01-16 NOTE — Telephone Encounter (Signed)
 Order placed and left message for the patient;s daughter to return my call

## 2024-01-16 NOTE — Telephone Encounter (Signed)
 Order placed in front office for pickup. Patient's daughter Graeme will come to retrieve.

## 2024-01-23 ENCOUNTER — Ambulatory Visit: Admitting: Family Medicine

## 2024-01-23 ENCOUNTER — Encounter: Payer: Self-pay | Admitting: Family Medicine

## 2024-01-23 DIAGNOSIS — Z0000 Encounter for general adult medical examination without abnormal findings: Secondary | ICD-10-CM

## 2024-01-23 NOTE — Patient Instructions (Addendum)
 I really enjoyed getting to talk with you today! I am available on Tuesdays and Thursdays for virtual visits if you have any questions or concerns, or if I can be of any further assistance.   CHECKLIST FROM ANNUAL WELLNESS VISIT:  -Follow up (please call to schedule if not scheduled after visit):   -yearly for annual wellness visit with primary care office  Here is a list of your preventive care/health maintenance measures and the plan for each if any are due:  PLAN For any measures below that may be due:    1. Can get the labs, foot exam and flu shot at your appointment on the 21st.   2. Call to schedule your eye exam. Ask your eye doctor to send us  the report.  Health Maintenance  Topic Date Due   FOOT EXAM  Never done   OPHTHALMOLOGY EXAM  11/22/2018   Influenza Vaccine  11/22/2023   COVID-19 Vaccine (1 - 2024-25 season) 02/08/2024 (Originally 12/23/2023)   Zoster Vaccines- Shingrix (1 of 2) 04/24/2024 (Originally 09/18/1981)   DTaP/Tdap/Td (1 - Tdap) 01/22/2025 (Originally 09/19/1948)   Pneumococcal Vaccine: 50+ Years (2 of 2 - PPSV23, PCV20, or PCV21) 01/22/2025 (Originally 04/19/2014)   DEXA SCAN  01/22/2029 (Originally 09/18/1996)   HEMOGLOBIN A1C  01/29/2024   Medicare Annual Wellness (AWV)  01/22/2025   HPV VACCINES  Aged Out   Meningococcal B Vaccine  Aged Out    -See a dentist at least yearly  -Get your eyes checked and then per your eye specialist's recommendations  -Other issues addressed today:   -I have included below further information regarding a healthy whole foods based diet, physical activity guidelines for adults, stress management and opportunities for social connections. I hope you find this information useful.    -----------------------------------------------------------------------------------------------------------------------------------------------------------------------------------------------------------------------------------------------------------    NUTRITION: -eat real food: lots of colorful vegetables (half the plate) and fruits -5-7 servings of vegetables and fruits per day (fresh or steamed is best), exp. 2 servings of vegetables with lunch and dinner and 2 servings of fruit per day. Berries and greens such as kale and collards are great choices.  -consume on a regular basis:  fresh fruits, fresh veggies, fish, nuts, seeds, healthy oils (such as olive oil, avocado oil), whole grains (make sure for bread/pasta/crackers/etc., that the first ingredient on label contains the word whole), legumes. -can eat small amounts of dairy and lean meat (no larger than the palm of your hand), but avoid processed meats such as ham, bacon, lunch meat, etc. -drink water -try to avoid fast food and pre-packaged foods, processed meat, ultra processed foods/beverages (donuts, candy, etc.) -most experts advise limiting sodium to < 2300mg  per day, should limit further is any chronic conditions such as high blood pressure, heart disease, diabetes, etc. The American Heart Association advised that < 1500mg  is is ideal -try to avoid foods/beverages that contain any ingredients with names you do not recognize  -try to avoid foods/beverages  with added sugar or sweeteners/sweets  -try to avoid sweet drinks (including diet drinks): soda, juice, Gatorade, sweet tea, power drinks, diet drinks -try to avoid white rice, white bread, pasta (unless whole grain)  EXERCISE GUIDELINES FOR ADULTS: -if you wish to increase your physical activity, do so gradually and with the approval of your doctor -STOP and seek medical care immediately if you have any chest pain, chest discomfort or trouble breathing when starting or  increasing exercise  -move and stretch your body, legs, feet and arms when sitting for long periods -  Physical activity guidelines for optimal health in adults: -get at least 150 minutes per week of moderate exercise (can talk, but not sing); this is about 20-30 minutes of sustained activity 5-7 days per week or two 10-15 minute episodes of sustained activity 5-7 days per week -do some muscle building/resistance training/strength training at least 2 days per week  -balance exercises 3+ days per week:   Stand somewhere where you have something sturdy to hold onto if you lose balance    1) lift up on toes, then back down, start with 5x per day and work up to 20x   2) stand and lift one leg straight out to the side so that foot is a few inches of the floor, start with 5x each side and work up to 20x each side   3) stand on one foot, start with 5 seconds each side and work up to 20 seconds on each side  If you need ideas or help with getting more active:  -Silver  sneakers https://tools.silversneakers.com  -Walk with a Doc: http://www.duncan-williams.com/  -try to include resistance (weight lifting/strength building) and balance exercises twice per week: or the following link for ideas: http://castillo-powell.com/  BuyDucts.dk  STRESS MANAGEMENT: -can try meditating, or just sitting quietly with deep breathing while intentionally relaxing all parts of your body for 5 minutes daily -if you need further help with stress, anxiety or depression please follow up with your primary doctor or contact the wonderful folks at [facility]: [phone]  SOCIAL CONNECTIONS: -options in [place] if you wish to engage in more social and exercise related activities:  -Silver  sneakers https://tools.silversneakers.com  -Walk with a Doc: http://www.duncan-williams.com/  -Check out the [facility] Active Adults 50+  section on the [place] [employer] (hiking clubs, book clubs, cards and games, chess, exercise classes, aquatic classes and much more) - see the website for details: https://www.[place].gov/departments/parks-recreation/active-adults50  -YouTube has lots of exercise videos for different ages and abilities as well  -[facility] (a variety of indoor and outdoor inperson activities for adults). [phone]. [address]  -Virtual Online Classes (a variety of topics): see seniorplanet.org or call [phone]  -consider volunteering at a school, hospice center, church, senior center or elsewhere

## 2024-01-23 NOTE — Progress Notes (Signed)
 PATIENT CHECK-IN and HEALTH RISK ASSESSMENT QUESTIONNAIRE:  -completed by phone/video for upcoming Medicare Preventive Visit  Pre-Visit Check-in: 1)Vitals (height, wt, BP, etc) - record in vitals section for visit on day of visit Request home vitals (wt, BP, etc.) and enter into vitals, THEN update Vital Signs SmartPhrase below at the top of the HPI. See below.  2)Review and Update Medications, Allergies PMH, Surgeries, Social history in Epic 3)Hospitalizations in the last year with date/reason? none  4)Review and Update Care Team (patient's specialists) in Epic 5) Complete PHQ9 in Epic  6) Complete Fall Screening in Epic 7)Review all Health Maintenance Due and order if not done.  Medicare Wellness Patient Questionnaire:  Answer theses question about your habits: How often do you have a drink containing alcohol?never How many drinks containing alcohol do you have on a typical day when you are drinking?0 How often do you have six or more drinks on one occasion?N/A Have you ever smoked?yes Quit date if applicable? Cannot recall  How many packs a day do/did you smoke? Never a pack Do you use smokeless tobacco?no Do you use an illicit drugs?no On average, how many days per week do you engage in moderate to strenuous exercise (like a brisk walk)?none On average, how many minutes do you engage in exercise at this level?N/A Are you sexually active? No Number of partners?N/A Typical breakfast-cereal as she does not cook much any longer Typical lunch-daughter will bring food or frozen meals from [employer] Typical dinner-frozen meals from [employer] - usually protein and veggies Typical snacks: Jello cups, grapes or apples  Beverages: water or fruit juice, soda occasionally Denies stress Reports good social connections: family comes to see her every other day, and people call her daily  Answer theses question about your everyday activities: Can you perform most household  chores?no-daughter assists if needed Are you deaf or have significant trouble hearing?no Do you feel that you have a problem with memory? sometimes Do you feel safe at home?yes Last dentist visit? 2 months ago 8. Do you have any difficulty performing your everyday activities?yes Are you having any difficulty walking, taking medications on your own, and or difficulty managing daily home needs?no Do you have difficulty walking or climbing stairs?yes-uses walker Do you have difficulty dressing or bathing?no Do you have difficulty doing errands alone such as visiting a doctor's office or shopping?yes-family assists Do you currently have any difficulty preparing food and eating?no Do you currently have any difficulty using the toilet?no Do you have any difficulty managing your finances?no Do you have any difficulties with housekeeping of managing your housekeeping?yes   Do you have Advanced Directives in place (Living Will, Healthcare Power or Attorney)? yes   Last eye Exam and location?unknown-approximately 3 years ago, [facility]   Do you currently use prescribed or non-prescribed narcotic or opioid pain medications?no  Do you have a history or close family history of breast, ovarian, tubal or peritoneal cancer or a family member with BRCA (breast cancer susceptibility 1 and 2) gene mutations? Mother and sister had breast cancer, daughter had ovarian cancer   Nurse/Assistant Credentials/time stamp: Idell Dragon [facility]     ----------------------------------------------------------------------------------------------------------------------------------------------------------------------------------------------------------------------  Because this visit was a virtual/telehealth visit, some criteria may be missing or patient reported. Any vitals not documented were not able to be obtained and vitals that have been documented are patient reported.    MEDICARE ANNUAL PREVENTIVE  VISIT WITH PROVIDER: (Welcome to Medicare, initial annual wellness or annual wellness exam)  Virtual Visit  via Phone Note  I connected with [name] on 01/23/24 by phone and verified that I am speaking with the correct person using two identifiers. She prefers a phone visit.   Location patient: home Location provider:work or home office Persons participating in the virtual visit: patient, provider  Concerns and/or follow up today: pretty stable, she has been working on her diet and has tired to lose some weight and has been successful.   See HM section in Epic for other details of completed HM.    ROS: negative for report of fevers, unintentional weight loss, vision changes, vision loss, hearing loss or change, chest pain, sob, hemoptysis, melena, hematochezia, hematuria, falls, bleeding or bruising, thoughts of suicide or self harm, memory loss  Patient-completed extensive health risk assessment - reviewed and discussed with the patient: See Health Risk Assessment completed with patient prior to the visit either above or in recent phone note. This was reviewed in detailed with the patient today and appropriate recommendations, orders and referrals were placed as needed per Summary below and patient instructions.   Review of Medical History: -PMH, PSH, Family History and current specialty and care providers reviewed and updated and listed below   Patient Care Team: Micheal Wolm ORN, MD as PCP - General (Family Medicine) Inocencio Soyla Lunger, MD as PCP - Electrophysiology (Cardiology) Lionell Jon DEL, [facility] (Pharmacist)   Past Medical History:  Diagnosis Date   Anemia    CAD 09/24/2008   AMI in 1998 tx with PCI to LAD;  myoview  1/12:  no ischemia, EF 45%   CARDIOMYOPATHY 09/24/2008   ischemic;  echo 4/12:  EF 20-25%, mild MR, mod LAE, mod reduced RVSF, mod RVE, mild RAE, mild TR, PASP 40   CAROTID BRUIT 09/24/2008   DIABETES MELLITUS 09/24/2008   Edema 09/24/2008   GERD  (gastroesophageal reflux disease)    HYPERLIPIDEMIA 09/24/2008   HYPERTENSION 09/24/2008   MI 09/24/2008   Osteopenia    Persistent atrial fibrillation (HCC) 09/24/2008   s/p DCCV x 4 in past;  Amiod d/c'd 2/2 abnormal PFTs;  Tikosyn  load 6/12 with DCCV   Tachycardia-bradycardia [facility])     Past Surgical History:  Procedure Laterality Date   CARDIOVERSION N/A 02/03/2015   Procedure: CARDIOVERSION;  Surgeon: Leim DEL Moose, MD;  Location: [facility] ENDOSCOPY;  Service: Cardiovascular;  Laterality: N/A;   CARDIOVERSION N/A 07/08/2018   Procedure: CARDIOVERSION;  Surgeon: Alveta Aleene PARAS, MD;  Location: [facility] ENDOSCOPY;  Service: Cardiovascular;  Laterality: N/A;  getting INR prior   ELECTROPHYSIOLOGIC STUDY N/A 10/15/2014   PVI and CTI ablation by Dr Kelsie   HIP FRACTURE SURGERY  2006   KNEE SURGERY  2008   broken knee   LOOP RECORDER IMPLANT N/A 11/06/2012   Procedure: LOOP RECORDER IMPLANT;  Surgeon: Lynwood Kelsie, MD;  Location[facility] CATH LAB;  Service: Cardiovascular;  Laterality: N/A;Medtronic LinQ implanted by Dr Kelsie for palpitaitons and dizziness   TEE WITHOUT CARDIOVERSION N/A 10/15/2014   Procedure: TRANSESOPHAGEAL ECHOCARDIOGRAM (TEE);  Surgeon: Aleene PARAS Alveta, MD;  Location: [facility] ENDOSCOPY;  Service: Cardiovascular;  Laterality: N/A;    Social History   Socioeconomic History   Marital status: Widowed    Spouse name: Not on file   Number of children: Not on file   Years of education: Not on file   Highest education level: Not on file  Occupational History   Not on file  Tobacco Use   Smoking status: Former    Current packs/day: 0.00  Average packs/day: 0.5 packs/day for 20.0 years (10.0 ttl pk-yrs)    Types: Cigarettes    Start date: 07/11/1976    Quit date: 07/11/1996    Years since quitting: 27.5    Passive exposure: Past   Smokeless tobacco: Never  Vaping Use   Vaping status: Not on file  Substance and Sexual Activity   Alcohol use: No   Drug use: No   Sexual activity: Not  Currently  Other Topics Concern   Not on file  Social History Narrative   Not on file   Social Drivers of Health   Financial Resource Strain: Low Risk  (01/23/2024)   Overall Financial Resource Strain (CARDIA)    Difficulty of Paying Living Expenses: Not hard at all  Food Insecurity: No Food Insecurity (01/23/2024)   Hunger Vital Sign    Worried About Running Out of Food in the Last Year: Never true    Ran Out of Food in the Last Year: Never true  Transportation Needs: No Transportation Needs (01/23/2024)   PRAPARE - Administrator, Civil Service (Medical): No    Lack of Transportation (Non-Medical): No  Physical Activity: Inactive (01/23/2024)   Exercise Vital Sign    Days of Exercise per Week: 0 days    Minutes of Exercise per Session: 0 min  Stress: No Stress Concern Present (01/23/2024)   [employer] of Occupational Health - Occupational Stress Questionnaire    Feeling of Stress: Not at all  Social Connections: Moderately Isolated (01/23/2024)   Social Connection and Isolation Panel    Frequency of Communication with Friends and Family: More than three times a week    Frequency of Social Gatherings with Friends and Family: More than three times a week    Attends Religious Services: More than 4 times per year    Active Member of [employer] or Organizations: No    Attends Banker Meetings: Never    Marital Status: Widowed  Intimate Partner Violence: Not At Risk (01/23/2024)   Humiliation, Afraid, Rape, and Kick questionnaire    Fear of Current or Ex-Partner: No    Emotionally Abused: No    Physically Abused: No    Sexually Abused: No    Family History  Problem Relation Age of Onset   Breast cancer Mother    Heart disease Mother    Diabetes Mother    Alcohol abuse Father    Breast cancer Sister    Brain cancer Sister        cause of death at 49 years old   Alcohol abuse Brother    Heart disease Brother    Heart disease Brother    Cancer Brother         unknown type   Coronary artery disease Other    Hypertension Other    Heart attack Brother    Heart attack Brother     Current Outpatient Medications on File Prior to Visit  Medication Sig Dispense Refill   acetaminophen  (TYLENOL ) 325 MG tablet Take 325 mg by mouth 2 (two) times daily as needed for moderate pain or headache.     ammonium lactate (LAC-HYDRIN) 12 % lotion 1 application     apixaban  (ELIQUIS ) 5 MG TABS tablet Take 1 tablet (5 mg total) by mouth 2 (two) times daily. 180 tablet 1   atorvastatin  (LIPITOR) 10 MG tablet Take 0.5 tablets (5 mg total) by mouth daily after supper. 45 tablet 3   Calcium  Carb-Cholecalciferol (CALCIUM  600+D3 PO) Take  1 tablet by mouth daily.     furosemide  (LASIX ) 40 MG tablet Take 1 tablet (40 mg total) by mouth daily. 90 tablet 3   Magnesium  Oxide 400 MG CAPS Take 1 capsule (400 mg total) by mouth daily. 90 capsule 3   metoprolol  tartrate (LOPRESSOR ) 100 MG tablet Take 0.5 tablets (50 mg total) by mouth 2 (two) times daily. 180 tablet 3   mometasone  (ELOCON ) 0.1 % lotion Apply topically daily. 60 mL 1   Multiple Vitamin (MULTIVITAMIN WITH MINERALS) TABS tablet Take 1 tablet by mouth daily. One-A-Day     ramipril  (ALTACE ) 10 MG capsule Take 2 capsules (20 mg total) by mouth daily. 180 capsule 3   sertraline  (ZOLOFT ) 25 MG tablet Take 1 tablet (25 mg total) by mouth daily. (Patient taking differently: Take 25 mg by mouth as needed.) 90 tablet 1   vitamin B-12 (CYANOCOBALAMIN) 500 MCG tablet Take 500 mcg by mouth daily.     No current facility-administered medications on file prior to visit.    Allergies  Allergen Reactions   Simvastatin Rash       Physical Exam Vitals requested from patient and listed below if patient had equipment and was able to obtain at home for this virtual visit: There were no vitals filed for this visit. Estimated body mass index is 28.54 kg/m as calculated from the following:   Height as of 11/05/23: 5' 5  (1.651 m).   Weight as of 11/05/23: 171 lb 8 oz (77.8 kg).  EKG (optional): deferred due to virtual visit  GENERAL: alert, oriented, no acute distress detected, full vision exam deferred due to pandemic and/or virtual encounter   PSYCH/NEURO: pleasant and cooperative, no obvious depression or anxiety, speech and thought processing grossly intact, Cognitive function grossly intact  Flowsheet Row Clinical Support from 01/23/2024 in [facility] [place]  PHQ-9 Total Score 2        01/23/2024    9:24 AM 08/07/2022   10:15 AM 08/06/2022    3:52 PM 08/02/2021   11:42 AM 07/27/2020   10:22 AM  Depression screen PHQ 2/9  Decreased Interest 0  0 0 0  Down, Depressed, Hopeless 0 0 0 0 0  PHQ - 2 Score 0 0 0 0 0  Altered sleeping 2 0     Tired, decreased energy 0 1     Change in appetite 0 0     Feeling bad or failure about yourself  0 0     Trouble concentrating 0 0     Moving slowly or fidgety/restless 0 0     Suicidal thoughts 0 0     PHQ-9 Score 2 1     Difficult doing work/chores  Not difficult at all          03/26/2021    8:24 PM 08/02/2021   11:47 AM 08/06/2022    3:53 PM 08/07/2022   10:14 AM 01/23/2024    9:23 AM  Fall Risk  Falls in the past year?  0 0 0 1  Was there an injury with Fall?  0 0 0 0  Fall Risk Category Calculator  0 0 0 1  Fall Risk Category (Retired)  Low      (RETIRED) Patient Fall Risk Level Low fall risk  Low fall risk      Patient at Risk for Falls Due to  No Fall Risks No Fall Risks No Fall Risks Impaired balance/gait  Fall risk Follow up  Falls prevention discussed Falls evaluation completed Falls evaluation completed     Data saved with a previous flowsheet row definition     SUMMARY AND PLAN:  Encounter for Medicare annual wellness exam   Discussed applicable health maintenance/preventive health measures and advised and referred or ordered per patient preferences: -she plans to get the flu shot, labs and foot exam at the  office -she plans to schedule her eye exam -declined shingles, covid, pneumonia and tetanus vaccines  Health Maintenance  Topic Date Due   FOOT EXAM  Never done   OPHTHALMOLOGY EXAM  11/22/2018   Influenza Vaccine  11/22/2023   COVID-19 Vaccine (1 - 2024-25 season) 02/08/2024 (Originally 12/23/2023)   Zoster Vaccines- Shingrix (1 of 2) 04/24/2024 (Originally 09/18/1981)   DTaP/Tdap/Td (1 - Tdap) 01/22/2025 (Originally 09/19/1948)   Pneumococcal Vaccine: 50+ Years (2 of 2 - PPSV23, PCV20, or PCV21) 01/22/2025 (Originally 04/19/2014)   DEXA SCAN  01/22/2029 (Originally 09/18/1996)   HEMOGLOBIN A1C  01/29/2024   Medicare Annual Wellness (AWV)  01/22/2025   HPV VACCINES  Aged Out   Meningococcal B Vaccine  Aged Out    Education and counseling on the following was provided based on the above review of health and a plan/checklist for the patient, along with additional information discussed, was provided for the patient in the patient instructions :  -Advised and counseled on a healthy lifestyle - including the importance of a healthy diet, regular physical activity, social connections and stress management. -Reviewed patient's current diet. Advised and counseled on a whole foods based healthy diet. A summary of a healthy diet was provided in the Patient Instructions.  -reviewed patient's current physical activity level and discussed exercise guidelines for adults. Discussed community resources and ideas for safe exercise at home to assist in meeting exercise guideline recommendations in a safe and healthy way.  -Advise yearly dental visits at minimum and regular eye exams  Follow up: see patient instructions     Patient Instructions  I really enjoyed getting to talk with you today! I am available on Tuesdays and Thursdays for virtual visits if you have any questions or concerns, or if I can be of any further assistance.   CHECKLIST FROM ANNUAL WELLNESS VISIT:  -Follow up (please call to  schedule if not scheduled after visit):   -yearly for annual wellness visit with primary care office  Here is a list of your preventive care/health maintenance measures and the plan for each if any are due:  PLAN For any measures below that may be due:    1. Can get the labs, foot exam and flu shot at your appointment on the 21st.   2. Call to schedule your eye exam. Ask your eye doctor to send us  the report.  Health Maintenance  Topic Date Due   FOOT EXAM  Never done   OPHTHALMOLOGY EXAM  11/22/2018   Influenza Vaccine  11/22/2023   COVID-19 Vaccine (1 - 2024-25 season) 02/08/2024 (Originally 12/23/2023)   Zoster Vaccines- Shingrix (1 of 2) 04/24/2024 (Originally 09/18/1981)   DTaP/Tdap/Td (1 - Tdap) 01/22/2025 (Originally 09/19/1948)   Pneumococcal Vaccine: 50+ Years (2 of 2 - PPSV23, PCV20, or PCV21) 01/22/2025 (Originally 04/19/2014)   DEXA SCAN  01/22/2029 (Originally 09/18/1996)   HEMOGLOBIN A1C  01/29/2024   Medicare Annual Wellness (AWV)  01/22/2025   HPV VACCINES  Aged Out   Meningococcal B Vaccine  Aged Out    -See a dentist at least yearly  -Get your eyes checked and  then per your eye specialist's recommendations  -Other issues addressed today:   -I have included below further information regarding a healthy whole foods based diet, physical activity guidelines for adults, stress management and opportunities for social connections. I hope you find this information useful.   -----------------------------------------------------------------------------------------------------------------------------------------------------------------------------------------------------------------------------------------------------------    NUTRITION: -eat real food: lots of colorful vegetables (half the plate) and fruits -5-7 servings of vegetables and fruits per day (fresh or steamed is best), exp. 2 servings of vegetables with lunch and dinner and 2 servings of fruit per day.  Berries and greens such as kale and collards are great choices.  -consume on a regular basis:  fresh fruits, fresh veggies, fish, nuts, seeds, healthy oils (such as olive oil, avocado oil), whole grains (make sure for bread/pasta/crackers/etc., that the first ingredient on label contains the word whole), legumes. -can eat small amounts of dairy and lean meat (no larger than the palm of your hand), but avoid processed meats such as ham, bacon, lunch meat, etc. -drink water -try to avoid fast food and pre-packaged foods, processed meat, ultra processed foods/beverages (donuts, candy, etc.) -most experts advise limiting sodium to < 2300mg  per day, should limit further is any chronic conditions such as high blood pressure, heart disease, diabetes, etc. The American Heart Association advised that < 1500mg  is is ideal -try to avoid foods/beverages that contain any ingredients with names you do not recognize  -try to avoid foods/beverages  with added sugar or sweeteners/sweets  -try to avoid sweet drinks (including diet drinks): soda, juice, Gatorade, sweet tea, power drinks, diet drinks -try to avoid white rice, white bread, pasta (unless whole grain)  EXERCISE GUIDELINES FOR ADULTS: -if you wish to increase your physical activity, do so gradually and with the approval of your doctor -STOP and seek medical care immediately if you have any chest pain, chest discomfort or trouble breathing when starting or increasing exercise  -move and stretch your body, legs, feet and arms when sitting for long periods -Physical activity guidelines for optimal health in adults: -get at least 150 minutes per week of moderate exercise (can talk, but not sing); this is about 20-30 minutes of sustained activity 5-7 days per week or two 10-15 minute episodes of sustained activity 5-7 days per week -do some muscle building/resistance training/strength training at least 2 days per week  -balance exercises 3+ days per  week:   Stand somewhere where you have something sturdy to hold onto if you lose balance    1) lift up on toes, then back down, start with 5x per day and work up to 20x   2) stand and lift one leg straight out to the side so that foot is a few inches of the floor, start with 5x each side and work up to 20x each side   3) stand on one foot, start with 5 seconds each side and work up to 20 seconds on each side  If you need ideas or help with getting more active:  -Silver  sneakers https://tools.silversneakers.com  -Walk with a Doc: http://www.duncan-williams.com/  -try to include resistance (weight lifting/strength building) and balance exercises twice per week: or the following link for ideas: http://castillo-powell.com/  BuyDucts.dk  STRESS MANAGEMENT: -can try meditating, or just sitting quietly with deep breathing while intentionally relaxing all parts of your body for 5 minutes daily -if you need further help with stress, anxiety or depression please follow up with your primary doctor or contact the wonderful folks at [facility]: [phone]  SOCIAL  CONNECTIONS: -options in [place] if you wish to engage in more social and exercise related activities:  -Silver  sneakers https://tools.silversneakers.com  -Walk with a Doc: http://www.duncan-williams.com/  -Check out the [facility] Active Adults 50+ section on the [place] of [employer] (hiking clubs, book clubs, cards and games, chess, exercise classes, aquatic classes and much more) - see the website for details: https://www.[place].gov/departments/parks-recreation/active-adults50  -YouTube has lots of exercise videos for different ages and abilities as well  -[facility] (a variety of indoor and outdoor inperson activities for adults). [phone]. [address]  -Virtual Online Classes (a variety of  topics): see seniorplanet.org or call [phone]  -consider volunteering at a school, hospice center, church, senior center or elsewhere            Joanne JONELLE Cramp, DO

## 2024-01-23 NOTE — Progress Notes (Signed)
 Patient unable to perform vital signs at home for the virtual visit.

## 2024-02-11 ENCOUNTER — Ambulatory Visit: Admitting: Family Medicine

## 2024-03-23 ENCOUNTER — Ambulatory Visit (INDEPENDENT_AMBULATORY_CARE_PROVIDER_SITE_OTHER)

## 2024-03-23 DIAGNOSIS — Z23 Encounter for immunization: Secondary | ICD-10-CM | POA: Diagnosis not present

## 2024-03-31 ENCOUNTER — Telehealth: Payer: Self-pay

## 2024-03-31 MED ORDER — ACCU-CHEK GUIDE TEST VI STRP: ORAL_STRIP | 0 refills | Status: AC

## 2024-03-31 MED ORDER — ACCU-CHEK GUIDE W/DEVICE KIT: PACK | 0 refills | Status: AC

## 2024-03-31 MED ORDER — ACCU-CHEK SOFTCLIX LANCETS MISC: 0 refills | Status: AC

## 2024-03-31 MED ORDER — ACCU-CHEK SOFTCLIX LANCETS MISC: 0 refills | Status: DC

## 2024-03-31 MED ORDER — ACCU-CHEK GUIDE TEST VI STRP: ORAL_STRIP | 0 refills | Status: DC

## 2024-03-31 MED ORDER — ACCU-CHEK GUIDE W/DEVICE KIT: PACK | 0 refills | Status: DC

## 2024-03-31 NOTE — Progress Notes (Signed)
   03/31/2024  Patient ID: [name], female   DOB: 12/26/1929, 89 y.o.   MRN: [id number]  Pharmacy Quality Measure Review  This patient is appearing on a report for being at risk of failing the adherence measure for cholesterol (statin) medications this calendar year.   Medication: Atorvastatin  Last fill date: 12/27/23 for 90 day supply  Contacted pharmacy to facilitate refills. and patient requested new rx for meter and supplies to check BG, reports her old one stopped working.  Jon VEAR Lindau, PharmD Clinical Pharmacist [phone]

## 2024-03-31 NOTE — Addendum Note (Signed)
 Addended by: LIONELL JON DEL on: 03/31/2024 10:31 AM   Modules accepted: Orders

## 2024-04-01 ENCOUNTER — Telehealth: Payer: Self-pay | Admitting: *Deleted

## 2024-04-01 NOTE — Telephone Encounter (Signed)
 Left message for the patient to return my call.

## 2024-04-01 NOTE — Telephone Encounter (Signed)
 Copied from CRM #[id number]. Topic: Clinical - Prescription Issue >> Apr 01, 2024  3:12 PM Rea ORN wrote: Reason for CRM: Pt daughter in law Mindy called to advise when the pt was seen on 12/1 she was supposed to have a blood sugar check, not a blood pressure check. Mindy said her monitor is broken and she asked for one on 12/1. Mindy would like someone to call back as soon as possible since they have been waiting since 03/23/24.   Please call [phone]

## 2024-04-02 NOTE — Telephone Encounter (Signed)
Left message for Mindy to return my call.

## 2024-04-08 NOTE — Telephone Encounter (Signed)
 ATC Mindy but could not reach at this time

## 2024-04-26 ENCOUNTER — Other Ambulatory Visit: Payer: Self-pay | Admitting: Student

## 2024-04-26 DIAGNOSIS — I4821 Permanent atrial fibrillation: Secondary | ICD-10-CM

## 2024-04-27 NOTE — Telephone Encounter (Signed)
 Eliquis  5mg  refill request received. Patient is 89 years old, weight-77.8kg, Crea-0.67 on 07/30/23, Diagnosis-Afib, and last seen by Jodie Passey on 11/05/23. Dose is appropriate based on dosing criteria. Will send in refill to requested pharmacy.
